# Patient Record
Sex: Female | Born: 1950 | Race: White | Hispanic: No | Marital: Single | Smoking: Never smoker
Health system: Southern US, Community
[De-identification: ages and names within clinical notes are randomized; demographics above are authoritative.]

## PROBLEM LIST (undated history)

## (undated) DIAGNOSIS — F0390 Unspecified dementia without behavioral disturbance: Secondary | ICD-10-CM

## (undated) DIAGNOSIS — I10 Essential (primary) hypertension: Secondary | ICD-10-CM

## (undated) DIAGNOSIS — F329 Major depressive disorder, single episode, unspecified: Secondary | ICD-10-CM

## (undated) DIAGNOSIS — M359 Systemic involvement of connective tissue, unspecified: Secondary | ICD-10-CM

## (undated) DIAGNOSIS — C439 Malignant melanoma of skin, unspecified: Secondary | ICD-10-CM

## (undated) DIAGNOSIS — F32A Depression, unspecified: Secondary | ICD-10-CM

## (undated) DIAGNOSIS — E119 Type 2 diabetes mellitus without complications: Secondary | ICD-10-CM

## (undated) DIAGNOSIS — D649 Anemia, unspecified: Secondary | ICD-10-CM

## (undated) DIAGNOSIS — I639 Cerebral infarction, unspecified: Secondary | ICD-10-CM

## (undated) DIAGNOSIS — M1990 Unspecified osteoarthritis, unspecified site: Secondary | ICD-10-CM

## (undated) HISTORY — PX: BREAST LUMPECTOMY: SHX2

## (undated) HISTORY — PX: ABDOMINAL HYSTERECTOMY: SHX81

## (undated) HISTORY — PX: CARPAL TUNNEL RELEASE: SHX101

## (undated) HISTORY — PX: EXCISION MELANOMA WITH SENTINEL LYMPH NODE BIOPSY: SHX5628

---

## 2005-04-22 ENCOUNTER — Ambulatory Visit: Payer: Self-pay | Admitting: Unknown Physician Specialty

## 2006-07-21 ENCOUNTER — Ambulatory Visit: Payer: Self-pay | Admitting: Unknown Physician Specialty

## 2007-11-17 ENCOUNTER — Ambulatory Visit: Payer: Self-pay | Admitting: Unknown Physician Specialty

## 2008-08-11 ENCOUNTER — Ambulatory Visit: Payer: Self-pay | Admitting: Unknown Physician Specialty

## 2009-01-09 ENCOUNTER — Ambulatory Visit: Payer: Self-pay | Admitting: Unknown Physician Specialty

## 2009-08-03 ENCOUNTER — Ambulatory Visit: Payer: Self-pay | Admitting: Gastroenterology

## 2010-04-17 ENCOUNTER — Ambulatory Visit: Payer: Self-pay | Admitting: Unknown Physician Specialty

## 2011-01-07 ENCOUNTER — Ambulatory Visit: Payer: Self-pay | Admitting: Unknown Physician Specialty

## 2011-06-25 ENCOUNTER — Ambulatory Visit: Payer: Self-pay | Admitting: Unknown Physician Specialty

## 2012-07-16 ENCOUNTER — Ambulatory Visit: Payer: Self-pay | Admitting: Physician Assistant

## 2012-09-03 ENCOUNTER — Ambulatory Visit: Payer: Self-pay | Admitting: Physician Assistant

## 2012-09-29 DIAGNOSIS — C439 Malignant melanoma of skin, unspecified: Secondary | ICD-10-CM

## 2012-09-29 HISTORY — DX: Malignant melanoma of skin, unspecified: C43.9

## 2013-03-15 ENCOUNTER — Ambulatory Visit: Payer: Self-pay | Admitting: Physician Assistant

## 2014-10-11 ENCOUNTER — Ambulatory Visit: Payer: Self-pay | Admitting: Physician Assistant

## 2015-11-23 ENCOUNTER — Encounter: Payer: Self-pay | Admitting: *Deleted

## 2015-11-26 ENCOUNTER — Encounter
Admission: RE | Disposition: A | Discharge status: Home / Self Care | Payer: Self-pay | Source: Ambulatory Visit | Attending: Gastroenterology

## 2015-11-26 ENCOUNTER — Encounter: Payer: Self-pay | Admitting: *Deleted

## 2015-11-26 ENCOUNTER — Ambulatory Visit: Payer: Federal, State, Local not specified - PPO | Admitting: Certified Registered Nurse Anesthetist

## 2015-11-26 ENCOUNTER — Ambulatory Visit
Admission: RE | Admit: 2015-11-26 | Discharge: 2015-11-26 | Disposition: A | Discharge status: Home / Self Care | Payer: Federal, State, Local not specified - PPO | Source: Ambulatory Visit | Attending: Gastroenterology | Admitting: Gastroenterology

## 2015-11-26 ENCOUNTER — Other Ambulatory Visit: Payer: Self-pay | Admitting: Gastroenterology

## 2015-11-26 DIAGNOSIS — Z87891 Personal history of nicotine dependence: Secondary | ICD-10-CM | POA: Diagnosis not present

## 2015-11-26 DIAGNOSIS — Z85820 Personal history of malignant melanoma of skin: Secondary | ICD-10-CM | POA: Insufficient documentation

## 2015-11-26 DIAGNOSIS — E119 Type 2 diabetes mellitus without complications: Secondary | ICD-10-CM | POA: Diagnosis not present

## 2015-11-26 DIAGNOSIS — F329 Major depressive disorder, single episode, unspecified: Secondary | ICD-10-CM | POA: Diagnosis not present

## 2015-11-26 DIAGNOSIS — Z7984 Long term (current) use of oral hypoglycemic drugs: Secondary | ICD-10-CM | POA: Insufficient documentation

## 2015-11-26 DIAGNOSIS — K2950 Unspecified chronic gastritis without bleeding: Secondary | ICD-10-CM | POA: Insufficient documentation

## 2015-11-26 DIAGNOSIS — K317 Polyp of stomach and duodenum: Secondary | ICD-10-CM | POA: Diagnosis not present

## 2015-11-26 DIAGNOSIS — M1990 Unspecified osteoarthritis, unspecified site: Secondary | ICD-10-CM | POA: Insufficient documentation

## 2015-11-26 DIAGNOSIS — K219 Gastro-esophageal reflux disease without esophagitis: Secondary | ICD-10-CM | POA: Diagnosis not present

## 2015-11-26 DIAGNOSIS — Z885 Allergy status to narcotic agent status: Secondary | ICD-10-CM | POA: Diagnosis not present

## 2015-11-26 DIAGNOSIS — Z79899 Other long term (current) drug therapy: Secondary | ICD-10-CM | POA: Diagnosis not present

## 2015-11-26 DIAGNOSIS — R1310 Dysphagia, unspecified: Secondary | ICD-10-CM | POA: Diagnosis present

## 2015-11-26 DIAGNOSIS — I10 Essential (primary) hypertension: Secondary | ICD-10-CM | POA: Diagnosis not present

## 2015-11-26 DIAGNOSIS — K2960 Other gastritis without bleeding: Secondary | ICD-10-CM | POA: Diagnosis not present

## 2015-11-26 DIAGNOSIS — K222 Esophageal obstruction: Secondary | ICD-10-CM | POA: Diagnosis not present

## 2015-11-26 HISTORY — PX: ESOPHAGOGASTRODUODENOSCOPY (EGD) WITH PROPOFOL: SHX5813

## 2015-11-26 HISTORY — DX: Depression, unspecified: F32.A

## 2015-11-26 HISTORY — DX: Type 2 diabetes mellitus without complications: E11.9

## 2015-11-26 HISTORY — DX: Malignant melanoma of skin, unspecified: C43.9

## 2015-11-26 HISTORY — DX: Anemia, unspecified: D64.9

## 2015-11-26 HISTORY — DX: Major depressive disorder, single episode, unspecified: F32.9

## 2015-11-26 HISTORY — DX: Unspecified osteoarthritis, unspecified site: M19.90

## 2015-11-26 HISTORY — DX: Essential (primary) hypertension: I10

## 2015-11-26 LAB — GLUCOSE, CAPILLARY: Glucose-Capillary: 169 mg/dL — ABNORMAL HIGH (ref 65–99)

## 2015-11-26 SURGERY — ESOPHAGOGASTRODUODENOSCOPY (EGD) WITH PROPOFOL
Anesthesia: General

## 2015-11-26 MED ORDER — SODIUM CHLORIDE 0.9 % IV SOLN
INTRAVENOUS | Status: DC
  Administered 2015-11-26 (×2): via INTRAVENOUS

## 2015-11-26 MED ORDER — SODIUM CHLORIDE 0.9 % IV SOLN: INTRAVENOUS | Status: DC

## 2015-11-26 MED ORDER — MIDAZOLAM HCL 2 MG/2ML IJ SOLN
INTRAMUSCULAR | Status: DC | PRN
  Administered 2015-11-26: 1 mg via INTRAVENOUS

## 2015-11-26 MED ORDER — PHENYLEPHRINE HCL 10 MG/ML IJ SOLN
INTRAMUSCULAR | Status: DC | PRN
  Administered 2015-11-26 (×2): 200 ug via INTRAVENOUS

## 2015-11-26 MED ORDER — PROPOFOL 500 MG/50ML IV EMUL
INTRAVENOUS | Status: DC | PRN
  Administered 2015-11-26: 150 ug/kg/min via INTRAVENOUS

## 2015-11-26 MED ORDER — FENTANYL CITRATE (PF) 100 MCG/2ML IJ SOLN
INTRAMUSCULAR | Status: DC | PRN
  Administered 2015-11-26: 50 ug via INTRAVENOUS

## 2015-11-26 MED ORDER — GLYCOPYRROLATE 0.2 MG/ML IJ SOLN
INTRAMUSCULAR | Status: DC | PRN
  Administered 2015-11-26: 0.2 mg via INTRAVENOUS

## 2015-11-26 MED ORDER — LIDOCAINE HCL (CARDIAC) 20 MG/ML IV SOLN
INTRAVENOUS | Status: DC | PRN
  Administered 2015-11-26: 60 mg via INTRAVENOUS

## 2015-11-26 MED ORDER — ONDANSETRON HCL 4 MG/2ML IJ SOLN
INTRAMUSCULAR | Status: DC | PRN
Start: 2015-11-26 — End: 2015-11-26
  Administered 2015-11-26: 4 mg via INTRAVENOUS

## 2015-11-26 MED ORDER — PROPOFOL 10 MG/ML IV BOLUS
INTRAVENOUS | Status: DC | PRN
  Administered 2015-11-26: 50 mg via INTRAVENOUS
  Administered 2015-11-26: 20 mg via INTRAVENOUS
  Administered 2015-11-26: 30 mg via INTRAVENOUS
  Administered 2015-11-26 (×2): 20 mg via INTRAVENOUS
  Administered 2015-11-26: 10 mg via INTRAVENOUS
  Administered 2015-11-26: 20 mg via INTRAVENOUS

## 2015-11-26 NOTE — Op Note (Signed)
[facility] Patient Name: Emily Lozano Procedure Date: 11/26/2015 8:25 AM MRN: [id number] Account #: [id number] Date of Birth: 07-17-1951 Admit Type: Outpatient Age: 65 Room: [facility] ENDO ROOM 2 Gender: Female Note Status: Finalized Procedure:            Upper GI endoscopy Indications:          Dysphagia, Esophageal reflux Providers:            Lollie Sails, MD Referring MD:         No Local Md, MD (Referring MD) Medicines:            Monitored Anesthesia Care Complications:        No immediate complications. Procedure:            Pre-Anesthesia Assessment:                       - ASA Grade Assessment: III - A patient with severe                        systemic disease.                       After obtaining informed consent, the endoscope was                        passed under direct vision. Throughout the procedure,                        the patient's blood pressure, pulse, and oxygen                        saturations were monitored continuously. The Olympus                        GIF-160 endoscope (S#. [id number]) was introduced through                        the mouth, and advanced to the third part of duodenum.                        The upper GI endoscopy was accomplished without                        difficulty. The patient tolerated the procedure well. Findings:      A low-grade of narrowing Schatzki ring (acquired) was found at the       gastroesophageal junction. A TTS dilator was passed through the scope.       Dilation with a 12-13.5-15 mm balloon dilator was performed to 15 mm.      The Z-line was variable. Biopsies were taken with a cold forceps for       histology.      Localized mild inflammation characterized by congestion (edema),       erythema and linear erosions was found in the gastric antrum. Biopsies       were taken with a cold forceps for histology.      Multiple 1 to 3 mm sessile polyps with no bleeding and no  stigmata of       recent bleeding were found in the cardia and in the gastric body. These       polyps  were removed with a cold biopsy forceps. Resection and retrieval       were complete.      The cardia and gastric fundus were normal on retroflexion.      The examined duodenum was normal.      Abnormal motility was noted in the upper third of the esophagus, in the       middle third of the esophagus and in the lower third of the esophagus.       The cricopharyngeus was abnormal. Partial ring noted.. Impression:           - Low-grade of narrowing Schatzki ring. Dilated.                       - Z-line variable. Biopsied.                       - Erosive gastritis. Biopsied.                       - Multiple gastric polyps. Resected and retrieved.                       - Normal examined duodenum. Recommendation:       - Discharge patient to home.                       - Perform routine esophageal manometry at appointment                        to be scheduled.                       - Barium swallow. Procedure Code(s):    --- Professional ---                       [phone], Esophagogastroduodenoscopy, flexible, transoral;                        with transendoscopic balloon dilation of esophagus                        (less than 30 mm diameter)                       43239, Esophagogastroduodenoscopy, flexible, transoral;                        with biopsy, single or multiple Diagnosis Code(s):    --- Professional ---                       K22.2, Esophageal obstruction                       K22.8, Other specified diseases of esophagus                       K29.60, Other gastritis without bleeding                       K31.7, Polyp of stomach and duodenum                       R13.10, Dysphagia, unspecified  K21.9, Gastro-esophageal reflux disease without                        esophagitis CPT copyright 2016 American Medical Association. All rights reserved. The codes  documented in this report are preliminary and upon coder review may  be revised to meet current compliance requirements. Lollie Sails, MD 11/26/2015 9:22:44 AM This report has been signed electronically. Number of Addenda: 0 Note Initiated On: 11/26/2015 8:25 AM      [facility]

## 2015-11-26 NOTE — Anesthesia Preprocedure Evaluation (Signed)
Anesthesia Evaluation  Patient identified by MRN, date of birth, ID band Patient awake    Reviewed: Allergy & Precautions, H&P , NPO status , Patient's Chart, lab work & pertinent test results, reviewed documented beta blocker date and time   Airway Mallampati: III   Neck ROM: full    Dental  (+) Poor Dentition   Pulmonary neg pulmonary ROS, former smoker,    Pulmonary exam normal        Cardiovascular hypertension, negative cardio ROS Normal cardiovascular exam     Neuro/Psych PSYCHIATRIC DISORDERS negative neurological ROS  negative psych ROS   GI/Hepatic negative GI ROS, Neg liver ROS,   Endo/Other  negative endocrine ROSdiabetes  Renal/GU negative Renal ROS  negative genitourinary   Musculoskeletal   Abdominal   Peds  Hematology negative hematology ROS (+) anemia ,   Anesthesia Other Findings Past Medical History:   Anemia                                                       Arthritis                                                    Cancer ([place])                                                 Depression                                                   Diabetes mellitus without complication ([place])                 Hypertension                                                 Melanoma ([place])                                             Past Surgical History:   BREAST LUMPECTOMY                                             CARPAL TUNNEL RELEASE                                         EXCISION MELANOMA WITH SENTINEL LYMPH NODE BIO*               ABDOMINAL HYSTERECTOMY  BMI    Body Mass Index   31.99 kg/m 2     Reproductive/Obstetrics                             Anesthesia Physical Anesthesia Plan  ASA: III  Anesthesia Plan: General   Post-op Pain Management:    Induction:   Airway Management Planned:   Additional Equipment:   Intra-op  Plan:   Post-operative Plan:   Informed Consent: I have reviewed the patients History and Physical, chart, labs and discussed the procedure including the risks, benefits and alternatives for the proposed anesthesia with the patient or authorized representative who has indicated his/her understanding and acceptance.   Dental Advisory Given  Plan Discussed with: CRNA  Anesthesia Plan Comments:         Anesthesia Quick Evaluation

## 2015-11-26 NOTE — Anesthesia Postprocedure Evaluation (Signed)
Anesthesia Post Note  Patient: Emily Lozano  Procedure(s) Performed: Procedure(s) (LRB): ESOPHAGOGASTRODUODENOSCOPY (EGD) WITH PROPOFOL (N/A)  Patient location during evaluation: PACU Anesthesia Type: General Level of consciousness: awake and alert Pain management: pain level controlled Vital Signs Assessment: post-procedure vital signs reviewed and stable Respiratory status: spontaneous breathing, nonlabored ventilation, respiratory function stable and patient connected to nasal cannula oxygen Cardiovascular status: blood pressure returned to baseline and stable Postop Assessment: no signs of nausea or vomiting Anesthetic complications: no    Last Vitals:  Filed Vitals:   11/26/15 0939 11/26/15 0949  BP: 117/75 128/77  Pulse: 71 68  Temp:    Resp: 16 25    Last Pain: There were no vitals filed for this visit.               Molli Barrows

## 2015-11-26 NOTE — Transfer of Care (Signed)
Immediate Anesthesia Transfer of Care Note  Patient: Emily Lozano  Procedure(s) Performed: Procedure(s): ESOPHAGOGASTRODUODENOSCOPY (EGD) WITH PROPOFOL (N/A)  Patient Location: PACU  Anesthesia Type:MAC and General  Level of Consciousness: sedated  Airway & Oxygen Therapy: Patient Spontanous Breathing  Post-op Assessment: Report given to RN  Post vital signs: stable  Last Vitals:  Filed Vitals:   11/26/15 0736  BP: 149/89  Pulse: 70  Temp: 36.8 C  Resp: 14    Complications: No apparent anesthesia complications

## 2015-11-26 NOTE — H&P (Signed)
Outpatient short stay form Pre-procedure 11/26/2015 8:26 AM Lollie Sails MD  Primary Physician: Jackelyn Poling NP  Reason for visit:  EGD  History of present illness:  Patient is a 65 year old female presenting today for EGD. She has a history of difficult to treat GERD. She is currently taking DEXA labs daily and has been for about a year. She relates some pill dysphagia but she does not regurgitate foods.  She has held her 81 mg aspirin since last Wednesday. She takes no anticoagulation medications or other aspirin products.  Current facility-administered medications:  .  0.9 %  sodium chloride infusion, , Intravenous, Continuous, Lollie Sails, MD, Last Rate: 20 mL/hr at 11/26/15 0805 .  0.9 %  sodium chloride infusion, , Intravenous, Continuous, Lollie Sails, MD  Prescriptions prior to admission  Medication Sig Dispense Refill Last Dose  . buPROPion (WELLBUTRIN XL) 300 MG 24 hr tablet Take 300 mg by mouth daily.   11/25/2015 at Unknown time  . folic acid (FOLVITE) 1 MG tablet Take 1 mg by mouth daily.   Past Week at Unknown time  . hydroxychloroquine (PLAQUENIL) 200 MG tablet Take 400 mg by mouth daily.   11/25/2015 at Unknown time  . losartan-hydrochlorothiazide (HYZAAR) 100-12.5 MG tablet Take 1 tablet by mouth daily.   11/26/2015 at 0600  . metFORMIN (GLUCOPHAGE) 500 MG tablet Take 1,000 mg by mouth 2 (two) times daily with a meal.   11/25/2015 at Unknown time  . methotrexate (RHEUMATREX) 2.5 MG tablet Take 2.5 mg by mouth once a week. Caution:Chemotherapy. Protect from light.     . metoprolol succinate (TOPROL-XL) 50 MG 24 hr tablet Take 50 mg by mouth daily. Take with or immediately following a meal.   11/26/2015 at 0600  . pantoprazole (PROTONIX) 40 MG tablet Take 40 mg by mouth 2 (two) times daily.   11/25/2015 at Unknown time  . rosuvastatin (CRESTOR) 10 MG tablet Take 10 mg by mouth daily.   11/25/2015 at Unknown time  . sucralfate (CARAFATE) 1 g tablet Take 1 g by  mouth 4 (four) times daily as needed.        Allergies  Allergen Reactions  . Morphine And Related Nausea And Vomiting     Past Medical History  Diagnosis Date  . Anemia   . Arthritis   . Cancer ([place])   . Depression   . Diabetes mellitus without complication ([place])   . Hypertension   . Melanoma ([place])     Review of systems:      Physical Exam    Heart and lungs: Regular rate and rhythm without rub or gallop, lungs are bilaterally clear.    HEENT: Normocephalic atraumatic eyes are anicteric    Other:     Pertinant exam for procedure: Soft nontender nondistended bowel sounds positive normoactive    Planned proceedures: EGD with possible dilatation. Indicated procedures. I have discussed the risks benefits and complications of procedures to include not limited to bleeding, infection, perforation and the risk of sedation and the patient wishes to proceed.    Lollie Sails, MD Gastroenterology 11/26/2015  8:26 AM

## 2015-11-28 ENCOUNTER — Encounter: Payer: Self-pay | Admitting: Gastroenterology

## 2015-11-28 LAB — SURGICAL PATHOLOGY

## 2015-12-05 ENCOUNTER — Encounter: Payer: Self-pay | Admitting: Gastroenterology

## 2015-12-05 ENCOUNTER — Ambulatory Visit
Admission: RE | Admit: 2015-12-05 | Discharge: 2015-12-05 | Disposition: A | Discharge status: Home / Self Care | Payer: Federal, State, Local not specified - PPO | Source: Ambulatory Visit | Attending: Gastroenterology | Admitting: Gastroenterology

## 2015-12-05 ENCOUNTER — Ambulatory Visit: Payer: Federal, State, Local not specified - PPO

## 2015-12-05 ENCOUNTER — Encounter
Admission: RE | Disposition: A | Discharge status: Home / Self Care | Payer: Self-pay | Source: Ambulatory Visit | Attending: Gastroenterology

## 2015-12-05 DIAGNOSIS — K219 Gastro-esophageal reflux disease without esophagitis: Secondary | ICD-10-CM | POA: Diagnosis not present

## 2015-12-05 DIAGNOSIS — R1310 Dysphagia, unspecified: Secondary | ICD-10-CM | POA: Insufficient documentation

## 2015-12-05 DIAGNOSIS — Z79899 Other long term (current) drug therapy: Secondary | ICD-10-CM | POA: Insufficient documentation

## 2015-12-05 HISTORY — PX: ESOPHAGEAL MANOMETRY: SHX5429

## 2015-12-05 SURGERY — MANOMETRY, ESOPHAGUS

## 2015-12-05 MED ORDER — BUTAMBEN-TETRACAINE-BENZOCAINE 2-2-14 % EX AERO
1.0000 | INHALATION_SPRAY | Freq: Once | CUTANEOUS | Status: AC
  Administered 2015-12-05: 1 via TOPICAL
  Filled 2015-12-05: qty 20

## 2015-12-05 MED ORDER — LIDOCAINE HCL 2 % EX GEL
1.0000 "application " | Freq: Once | CUTANEOUS | Status: AC
  Administered 2015-12-05: 3
  Filled 2015-12-05: qty 5

## 2015-12-05 SURGICAL SUPPLY — 2 items
FACESHIELD LNG OPTICON STERILE (SAFETY) IMPLANT
GLOVE BIO SURGEON STRL SZ8 (GLOVE) ×6 IMPLANT

## 2015-12-07 ENCOUNTER — Ambulatory Visit
Admission: RE | Admit: 2015-12-07 | Discharge: 2015-12-07 | Disposition: A | Discharge status: Home / Self Care | Payer: Federal, State, Local not specified - PPO | Source: Ambulatory Visit | Attending: Gastroenterology | Admitting: Gastroenterology

## 2015-12-07 DIAGNOSIS — R1310 Dysphagia, unspecified: Secondary | ICD-10-CM

## 2016-04-16 ENCOUNTER — Other Ambulatory Visit: Payer: Self-pay | Admitting: Physician Assistant

## 2016-04-16 DIAGNOSIS — Z1231 Encounter for screening mammogram for malignant neoplasm of breast: Secondary | ICD-10-CM

## 2016-05-05 ENCOUNTER — Other Ambulatory Visit: Payer: Self-pay | Admitting: Physician Assistant

## 2016-05-05 ENCOUNTER — Ambulatory Visit
Admission: RE | Admit: 2016-05-05 | Discharge: 2016-05-05 | Disposition: A | Discharge status: Home / Self Care | Payer: Medicare Other | Source: Ambulatory Visit | Attending: Physician Assistant | Admitting: Physician Assistant

## 2016-05-05 DIAGNOSIS — Z1231 Encounter for screening mammogram for malignant neoplasm of breast: Secondary | ICD-10-CM

## 2017-04-30 ENCOUNTER — Ambulatory Visit
Admission: RE | Admit: 2017-04-30 | Discharge: 2017-04-30 | Disposition: A | Discharge status: Home / Self Care | Payer: Medicare Other | Source: Ambulatory Visit | Attending: Gastroenterology | Admitting: Gastroenterology

## 2017-04-30 ENCOUNTER — Ambulatory Visit: Payer: Medicare Other | Admitting: Certified Registered Nurse Anesthetist

## 2017-04-30 ENCOUNTER — Other Ambulatory Visit: Payer: Self-pay | Admitting: Gastroenterology

## 2017-04-30 ENCOUNTER — Encounter
Admission: RE | Disposition: A | Discharge status: Home / Self Care | Payer: Self-pay | Source: Ambulatory Visit | Attending: Gastroenterology

## 2017-04-30 DIAGNOSIS — Z85820 Personal history of malignant melanoma of skin: Secondary | ICD-10-CM | POA: Diagnosis not present

## 2017-04-30 DIAGNOSIS — I10 Essential (primary) hypertension: Secondary | ICD-10-CM | POA: Insufficient documentation

## 2017-04-30 DIAGNOSIS — K317 Polyp of stomach and duodenum: Secondary | ICD-10-CM | POA: Diagnosis not present

## 2017-04-30 DIAGNOSIS — K210 Gastro-esophageal reflux disease with esophagitis: Secondary | ICD-10-CM | POA: Insufficient documentation

## 2017-04-30 DIAGNOSIS — K2950 Unspecified chronic gastritis without bleeding: Secondary | ICD-10-CM | POA: Insufficient documentation

## 2017-04-30 DIAGNOSIS — K2270 Barrett's esophagus without dysplasia: Secondary | ICD-10-CM | POA: Insufficient documentation

## 2017-04-30 DIAGNOSIS — Z7984 Long term (current) use of oral hypoglycemic drugs: Secondary | ICD-10-CM | POA: Insufficient documentation

## 2017-04-30 DIAGNOSIS — Z79899 Other long term (current) drug therapy: Secondary | ICD-10-CM | POA: Diagnosis not present

## 2017-04-30 DIAGNOSIS — E119 Type 2 diabetes mellitus without complications: Secondary | ICD-10-CM | POA: Insufficient documentation

## 2017-04-30 DIAGNOSIS — F329 Major depressive disorder, single episode, unspecified: Secondary | ICD-10-CM | POA: Insufficient documentation

## 2017-04-30 DIAGNOSIS — Z87891 Personal history of nicotine dependence: Secondary | ICD-10-CM | POA: Diagnosis not present

## 2017-04-30 DIAGNOSIS — R1310 Dysphagia, unspecified: Secondary | ICD-10-CM

## 2017-04-30 HISTORY — PX: ESOPHAGOGASTRODUODENOSCOPY (EGD) WITH PROPOFOL: SHX5813

## 2017-04-30 LAB — GLUCOSE, CAPILLARY: GLUCOSE-CAPILLARY: 148 mg/dL — AB (ref 65–99)

## 2017-04-30 SURGERY — ESOPHAGOGASTRODUODENOSCOPY (EGD) WITH PROPOFOL
Anesthesia: General

## 2017-04-30 MED ORDER — PROPOFOL 10 MG/ML IV BOLUS
INTRAVENOUS | Status: DC | PRN
  Administered 2017-04-30 (×2): 30 mg via INTRAVENOUS
  Administered 2017-04-30: 50 mg via INTRAVENOUS
  Administered 2017-04-30: 30 mg via INTRAVENOUS

## 2017-04-30 MED ORDER — PROPOFOL 500 MG/50ML IV EMUL
INTRAVENOUS | Status: DC | PRN
  Administered 2017-04-30: 150 ug/kg/min via INTRAVENOUS

## 2017-04-30 MED ORDER — EPHEDRINE SULFATE 50 MG/ML IJ SOLN
INTRAMUSCULAR | Status: AC
  Filled 2017-04-30: qty 1

## 2017-04-30 MED ORDER — SODIUM CHLORIDE 0.9 % IV SOLN
INTRAVENOUS | Status: DC
  Administered 2017-04-30: 1000 mL via INTRAVENOUS

## 2017-04-30 MED ORDER — PROPOFOL 500 MG/50ML IV EMUL
INTRAVENOUS | Status: AC
  Filled 2017-04-30: qty 50

## 2017-04-30 MED ORDER — SODIUM CHLORIDE 0.9 % IV SOLN: INTRAVENOUS | Status: DC

## 2017-04-30 MED ORDER — LIDOCAINE HCL (PF) 2 % IJ SOLN
INTRAMUSCULAR | Status: AC
  Filled 2017-04-30: qty 2

## 2017-04-30 MED ORDER — PROPOFOL 10 MG/ML IV BOLUS
INTRAVENOUS | Status: AC
  Filled 2017-04-30: qty 20

## 2017-04-30 MED ORDER — SUCCINYLCHOLINE CHLORIDE 20 MG/ML IJ SOLN
INTRAMUSCULAR | Status: AC
  Filled 2017-04-30: qty 1

## 2017-04-30 MED ORDER — GLYCOPYRROLATE 0.2 MG/ML IJ SOLN
INTRAMUSCULAR | Status: AC
  Filled 2017-04-30: qty 2

## 2017-04-30 MED ORDER — PHENYLEPHRINE HCL 10 MG/ML IJ SOLN
INTRAMUSCULAR | Status: AC
  Filled 2017-04-30: qty 1

## 2017-04-30 MED ORDER — LIDOCAINE HCL (CARDIAC) 20 MG/ML IV SOLN
INTRAVENOUS | Status: DC | PRN
  Administered 2017-04-30 (×2): 50 mg via INTRAVENOUS

## 2017-04-30 MED ORDER — GLYCOPYRROLATE 0.2 MG/ML IJ SOLN
INTRAMUSCULAR | Status: DC | PRN
  Administered 2017-04-30: 0.2 mg via INTRAVENOUS

## 2017-04-30 MED ORDER — ONDANSETRON HCL 4 MG/2ML IJ SOLN
INTRAMUSCULAR | Status: AC
  Filled 2017-04-30: qty 2

## 2017-04-30 NOTE — H&P (Signed)
Outpatient short stay form Pre-procedure 04/30/2017 7:36 AM Emily Sails MD  Primary Physician: Jackelyn Poling NP  Reason for visit:  EGD  History of present illness:  Patient is a 66 year old female presenting today as above. She has personal history of an EGD about a year ago for reflux that showed biopsies indicating possible Barrett's esophagus. She is repeating today for further evaluation. She has been on a proton pump inhibitor in the interim, DEXAlant. She states that her reflux symptoms are much better than they were that she will occasionally have nighttime breakthrough. She takes no aspirin or blood thinning agents.    Current Facility-Administered Medications:  .  0.9 %  sodium chloride infusion, , Intravenous, Continuous, Emily Sails, MD, Last Rate: 20 mL/hr at 04/30/17 0714, 1,000 mL at 04/30/17 0714 .  0.9 %  sodium chloride infusion, , Intravenous, Continuous, Emily Sails, MD  Prescriptions Prior to Admission  Medication Sig Dispense Refill Last Dose  . buPROPion (WELLBUTRIN XL) 300 MG 24 hr tablet Take 300 mg by mouth daily.   04/29/2017 at Unknown time  . folic acid (FOLVITE) 1 MG tablet Take 1 mg by mouth daily.   04/29/2017 at Unknown time  . hydroxychloroquine (PLAQUENIL) 200 MG tablet Take 400 mg by mouth daily.   04/29/2017 at Unknown time  . losartan-hydrochlorothiazide (HYZAAR) 100-12.5 MG tablet Take 1 tablet by mouth daily.   04/30/2017 at 0600  . metFORMIN (GLUCOPHAGE) 500 MG tablet Take 1,000 mg by mouth 2 (two) times daily with a meal.   04/29/2017 at Unknown time  . methotrexate (RHEUMATREX) 2.5 MG tablet Take 2.5 mg by mouth once a week. Caution:Chemotherapy. Protect from light.   04/29/2017 at Unknown time  . metoprolol succinate (TOPROL-XL) 50 MG 24 hr tablet Take 50 mg by mouth daily. Take with or immediately following a meal.   04/30/2017 at 0600  . pantoprazole (PROTONIX) 40 MG tablet Take 40 mg by mouth 2 (two) times daily.   04/29/2017 at Unknown time   . rosuvastatin (CRESTOR) 10 MG tablet Take 10 mg by mouth daily.   04/29/2017 at Unknown time  . sucralfate (CARAFATE) 1 g tablet Take 1 g by mouth 4 (four) times daily as needed.   04/29/2017 at Unknown time     Allergies  Allergen Reactions  . Morphine And Related Nausea And Vomiting     Past Medical History:  Diagnosis Date  . Anemia   . Arthritis   . Cancer ([place])   . Depression   . Diabetes mellitus without complication ([place])   . Hypertension   . Melanoma ([place])     Review of systems:      Physical Exam    Heart and lungs: Regular rate and rhythm without rub or gallop, lungs are bilaterally clear.    HEENT: Normocephalic atraumatic eyes are anicteric    Other:     Pertinant exam for procedure: Soft nontender nondistended bowel sounds positive normoactive.    Planned proceedures: EGD and indicated procedures. I have discussed the risks benefits and complications of procedures to include not limited to bleeding, infection, perforation and the risk of sedation and the patient wishes to proceed.    Emily Sails, MD Gastroenterology 04/30/2017  7:36 AM

## 2017-04-30 NOTE — Anesthesia Post-op Follow-up Note (Cosign Needed)
Anesthesia QCDR form completed.        

## 2017-04-30 NOTE — Transfer of Care (Signed)
Immediate Anesthesia Transfer of Care Note  Patient: Emily Lozano  Procedure(s) Performed: Procedure(s): ESOPHAGOGASTRODUODENOSCOPY (EGD) WITH PROPOFOL (N/A)  Patient Location: PACU  Anesthesia Type:General  Level of Consciousness: drowsy and patient cooperative  Airway & Oxygen Therapy: Patient Spontanous Breathing and Patient connected to nasal cannula oxygen  Post-op Assessment: Report given to RN and Post -op Vital signs reviewed and stable  Post vital signs: Reviewed and stable  Last Vitals:  Vitals:   04/30/17 0700 04/30/17 0812  BP: (!) 152/70 (!) 109/56  Pulse: 72 81  Resp: 16 (!) 21  Temp: (!) 36.1 C (!) 36 C    Last Pain:  Vitals:   04/30/17 0812  TempSrc: Tympanic         Complications: No apparent anesthesia complications

## 2017-04-30 NOTE — Anesthesia Postprocedure Evaluation (Signed)
Anesthesia Post Note  Patient: Emily Lozano  Procedure(s) Performed: Procedure(s) (LRB): ESOPHAGOGASTRODUODENOSCOPY (EGD) WITH PROPOFOL (N/A)  Patient location during evaluation: PACU Anesthesia Type: General Level of consciousness: awake Pain management: pain level controlled Respiratory status: spontaneous breathing Cardiovascular status: stable Anesthetic complications: no     Last Vitals:  Vitals:   04/30/17 0832 04/30/17 0842  BP: 126/63 (!) 124/57  Pulse: 75 73  Resp: 17 16  Temp:      Last Pain:  Vitals:   04/30/17 0812  TempSrc: Tympanic                 VAN STAVEREN,Baili Stang

## 2017-04-30 NOTE — Anesthesia Procedure Notes (Signed)
Date/Time: 04/30/2017 7:36 AM Performed by: Darlyne Russian Pre-anesthesia Checklist: Patient identified, Emergency Drugs available, Suction available, Patient being monitored and Timeout performed Patient Re-evaluated:Patient Re-evaluated prior to induction Oxygen Delivery Method: Nasal cannula Placement Confirmation: positive ETCO2

## 2017-04-30 NOTE — Op Note (Signed)
[facility] Patient Name: Emily Lozano Procedure Date: 04/30/2017 7:21 AM MRN: [id number] Account #: [id number] Date of Birth: 04/10/1951 Admit Type: Outpatient Age: 66 Room: [facility] ENDO ROOM 1 Gender: Female Note Status: Finalized Procedure:            Upper GI endoscopy Indications:          Follow-up of Barrett's esophagus Providers:            Lollie Sails, MD Referring MD:         Precious Bard, MD (Referring MD) Medicines:            Monitored Anesthesia Care Complications:        No immediate complications. Procedure:            Pre-Anesthesia Assessment:                       - ASA Grade Assessment: II - A patient with mild                        systemic disease.                       After obtaining informed consent, the endoscope was                        passed under direct vision. Throughout the procedure,                        the patient's blood pressure, pulse, and oxygen                        saturations were monitored continuously. The Endoscope                        was introduced through the mouth, and advanced to the                        third part of duodenum. The upper GI endoscopy was                        accomplished without difficulty. The patient tolerated                        the procedure well. Findings:      The Z-line was variable. Mucosa was biopsied with a cold forceps for       histology in 4 quadrants at the gastroesophageal junction. One specimen       bottle was sent to pathology.      On introduction of the scope there was the appearance of a moderate       sized nodule in the distal esophagus at about 38-39 cm from the       incisors. There was no overlying defect. However on removal of the scope       this nodule appearance was not present. Possible transient extrinsic       compression?      The exam of the esophagus was otherwise normal.      Localized mild inflammation  characterized by congestion (edema) and       erythema was found in the cardia and in the gastric antrum, near  polypoid in appearance. Biopsies were taken from both sites. The gastric       vault otherwise was normal in appearance. Biopsies were taken with a       cold forceps for histology.      The examined duodenum was normal. Impression:           - Z-line variable. Biopsied.                       - Gastritis. Biopsied.                       - Normal examined duodenum. Recommendation:       - Continue present medications.                       - Return to GI clinic in 3 weeks.                       - Await pathology results. Procedure Code(s):    --- Professional ---                       [phone], Esophagogastroduodenoscopy, flexible, transoral;                        with biopsy, single or multiple Diagnosis Code(s):    --- Professional ---                       K22.8, Other specified diseases of esophagus                       K29.70, Gastritis, unspecified, without bleeding                       K22.70, Barrett's esophagus without dysplasia CPT copyright 2016 American Medical Association. All rights reserved. The codes documented in this report are preliminary and upon coder review may  be revised to meet current compliance requirements. Lollie Sails, MD 04/30/2017 8:17:22 AM This report has been signed electronically. Number of Addenda: 0 Note Initiated On: 04/30/2017 7:21 AM      [facility]

## 2017-04-30 NOTE — Anesthesia Preprocedure Evaluation (Signed)
Anesthesia Evaluation  Patient identified by MRN, date of birth, ID band Patient awake    Reviewed: Allergy & Precautions, NPO status , reviewed documented beta blocker date and time   Airway Mallampati: III       Dental  (+) Teeth Intact   Pulmonary former smoker,    breath sounds clear to auscultation       Cardiovascular Exercise Tolerance: Good hypertension, Pt. on medications and Pt. on home beta blockers  Rhythm:Regular     Neuro/Psych Depression    GI/Hepatic negative GI ROS, Neg liver ROS,   Endo/Other  diabetes, Type 2, Oral Hypoglycemic Agents  Renal/GU negative Renal ROS     Musculoskeletal   Abdominal Normal abdominal exam  (+)   Peds negative pediatric ROS (+)  Hematology  (+) anemia ,   Anesthesia Other Findings   Reproductive/Obstetrics                             Anesthesia Physical Anesthesia Plan  ASA: II  Anesthesia Plan: General   Post-op Pain Management:    Induction: Intravenous  PONV Risk Score and Plan: 0  Airway Management Planned: Natural Airway and Nasal Cannula  Additional Equipment:   Intra-op Plan:   Post-operative Plan:   Informed Consent: I have reviewed the patients History and Physical, chart, labs and discussed the procedure including the risks, benefits and alternatives for the proposed anesthesia with the patient or authorized representative who has indicated his/her understanding and acceptance.     Plan Discussed with: CRNA  Anesthesia Plan Comments:         Anesthesia Quick Evaluation

## 2017-05-01 ENCOUNTER — Encounter: Payer: Self-pay | Admitting: Gastroenterology

## 2017-05-01 LAB — SURGICAL PATHOLOGY

## 2017-05-07 ENCOUNTER — Ambulatory Visit
Admission: RE | Admit: 2017-05-07 | Discharge: 2017-05-07 | Disposition: A | Discharge status: Home / Self Care | Payer: Medicare Other | Source: Ambulatory Visit | Attending: Gastroenterology | Admitting: Gastroenterology

## 2017-05-07 DIAGNOSIS — R1310 Dysphagia, unspecified: Secondary | ICD-10-CM | POA: Insufficient documentation

## 2017-05-07 DIAGNOSIS — R590 Localized enlarged lymph nodes: Secondary | ICD-10-CM | POA: Insufficient documentation

## 2017-05-07 DIAGNOSIS — I2510 Atherosclerotic heart disease of native coronary artery without angina pectoris: Secondary | ICD-10-CM | POA: Insufficient documentation

## 2017-05-07 DIAGNOSIS — R911 Solitary pulmonary nodule: Secondary | ICD-10-CM | POA: Insufficient documentation

## 2017-05-07 DIAGNOSIS — K760 Fatty (change of) liver, not elsewhere classified: Secondary | ICD-10-CM | POA: Diagnosis not present

## 2017-05-07 DIAGNOSIS — K2270 Barrett's esophagus without dysplasia: Secondary | ICD-10-CM | POA: Diagnosis not present

## 2017-05-07 DIAGNOSIS — I700 Atherosclerosis of aorta: Secondary | ICD-10-CM | POA: Insufficient documentation

## 2017-05-07 HISTORY — DX: Systemic involvement of connective tissue, unspecified: M35.9

## 2017-05-07 MED ORDER — IOPAMIDOL (ISOVUE-300) INJECTION 61%
75.0000 mL | Freq: Once | INTRAVENOUS | Status: AC | PRN
  Administered 2017-05-07: 75 mL via INTRAVENOUS

## 2017-06-16 ENCOUNTER — Ambulatory Visit (INDEPENDENT_AMBULATORY_CARE_PROVIDER_SITE_OTHER): Payer: Medicare Other | Admitting: Internal Medicine

## 2017-06-16 ENCOUNTER — Encounter: Payer: Self-pay | Admitting: Internal Medicine

## 2017-06-16 VITALS — BP 144/84 | HR 73 | Resp 16 | Ht 61.5 in | Wt 169.0 lb

## 2017-06-16 DIAGNOSIS — R918 Other nonspecific abnormal finding of lung field: Secondary | ICD-10-CM

## 2017-06-16 NOTE — Progress Notes (Signed)
Name: Emily Lozano MRN: [id number] DOB: 05-24-1951     CONSULTATION DATE: (Not on file)  REFERRING MD : [place]:  abnormal CT scan   STUDIES:  CT chest 05/07/2017 I have Independently reviewed images of  CT chest  on 06/16/2017 Interpretation: Left upper lobe subcentimeter pulmonary nodules groundglass appearance 6.9 mm    HISTORY OF PRESENT ILLNESS:  66 year old pleasant white female seen today for abnormal CT scan findings Patient has chronic gastro-esophageal reflux disease symptoms for many years and underwent a upper endoscopy and for some reason the GI doctor ordered a CT chest which found to have a left upper lobe incidental finding of a ground glass nodule approximately 6.9 mm  Patient is a former smoker approximately one pack a week for about 10 years Patient did have left arm surgery for history of melanoma back in October 2014 Patient does have a diagnosis of shock of sleep apnea however has been noncompliant over the last 6 months  Patient has no shortness of breath noticed an exertion [place] patient denies any wheezing or cough No fevers or chills No signs and symptoms of infection at this time No signs of acute heart failure at this time  I have reviewed the results with the patient and discussed the CT scan findings with the patient and she understands   PAST MEDICAL HISTORY :   has a past medical history of Anemia; Arthritis; Collagen vascular disease ([place]); Depression; Diabetes mellitus without complication ([place]); Hypertension; and Melanoma ([place]) (2014).  has a past surgical history that includes Breast lumpectomy; Carpal tunnel release; Excision melanoma with sentinel lymph node biopsy; Abdominal hysterectomy; Esophagogastroduodenoscopy (egd) with propofol (N/A, 11/26/2015); Esophageal manometry (N/A, 12/05/2015); and Esophagogastroduodenoscopy (egd) with propofol (N/A, 04/30/2017). Prior to Admission medications   Medication Sig Start Date  End Date Taking? Authorizing Provider  buPROPion (WELLBUTRIN XL) 300 MG 24 hr tablet Take 300 mg by mouth daily.   Yes [provider]  dexlansoprazole (DEXILANT) 60 MG capsule Take 60 mg by mouth daily. 03/27/17  Yes [provider]  folic acid (FOLVITE) 1 MG tablet Take 1 mg by mouth daily.   Yes [provider]  hydroxychloroquine (PLAQUENIL) 200 MG tablet Take 400 mg by mouth daily.   Yes [provider]  losartan-hydrochlorothiazide (HYZAAR) 100-12.5 MG tablet Take 1 tablet by mouth daily.   Yes [provider]  metFORMIN (GLUCOPHAGE) 500 MG tablet Take 1,000 mg by mouth 2 (two) times daily with a meal.   Yes [provider]  methotrexate (RHEUMATREX) 2.5 MG tablet Take 2.5 mg by mouth once a week. Caution:Chemotherapy. Protect from light.   Yes [provider]  metoprolol succinate (TOPROL-XL) 50 MG 24 hr tablet Take 50 mg by mouth daily. Take with or immediately following a meal.   Yes [provider]  pantoprazole (PROTONIX) 40 MG tablet Take 40 mg by mouth 2 (two) times daily.   Yes [provider]  rosuvastatin (CRESTOR) 10 MG tablet Take 10 mg by mouth daily.   Yes [provider]  sucralfate (CARAFATE) 1 g tablet Take 1 g by mouth 4 (four) times daily as needed.   Yes [provider]  aspirin EC 81 MG tablet Take 81 mg by mouth daily.    [provider]   Allergies  Allergen Reactions  . Morphine And Related Nausea And Vomiting    FAMILY HISTORY:  family history is not on file. SOCIAL HISTORY:  reports that she  has quit smoking. She has never used smokeless tobacco. She reports that she does not drink alcohol or use drugs.  REVIEW OF SYSTEMS:   Constitutional: Negative for fever, chills, weight loss, malaise/fatigue and diaphoresis.  HENT: Negative for hearing loss, ear pain, nosebleeds, congestion, sore throat, neck pain, tinnitus and ear discharge.   Eyes: Negative for  blurred vision, double vision, photophobia, pain, discharge and redness.  Respiratory: Negative for cough, hemoptysis, sputum production, shortness of breath, wheezing and stridor.   Cardiovascular: Negative for chest pain, palpitations, orthopnea, claudication, leg swelling and PND.  Gastrointestinal: Negative for heartburn, nausea, vomiting, abdominal pain, diarrhea, constipation, blood in stool and melena.  Genitourinary: Negative for dysuria, urgency, frequency, hematuria and flank pain.  Musculoskeletal: Negative for myalgias, back pain, joint pain and falls.  Skin: Negative for itching and rash.  Neurological: Negative for dizziness, tingling, tremors, sensory change, speech change, focal weakness, seizures, loss of consciousness, weakness and headaches.  Endo/Heme/Allergies: Negative for environmental allergies and polydipsia. Does not bruise/bleed easily.  ALL OTHER ROS ARE NEGATIVE   BP (!) 144/84 (BP Location: Left Arm, Cuff Size: Normal)   Pulse 73   Resp 16   Ht 5' 1.5" (1.562 m)   Wt 169 lb (76.7 kg)   SpO2 98%   BMI 31.42 kg/m    Physical Examination:   GENERAL:NAD, no fevers, chills, no weakness no fatigue HEAD: Normocephalic, atraumatic.  EYES: Pupils equal, round, reactive to light. Extraocular muscles intact. No scleral icterus.  MOUTH: Moist mucosal membrane.   EAR, NOSE, THROAT: Clear without exudates. No external lesions.  NECK: Supple. No thyromegaly. No nodules. No JVD.  PULMONARY:CTA B/L no wheezes, no crackles, no rhonchi CARDIOVASCULAR: S1 and S2. Regular rate and rhythm. No murmurs, rubs, or gallops. No edema.  GASTROINTESTINAL: Soft, nontender, nondistended. No masses. Positive bowel sounds.  MUSCULOSKELETAL: No swelling, clubbing, or edema. Range of motion full in all extremities.  NEUROLOGIC: Cranial nerves II through XII are intact. No gross focal neurological deficits.  SKIN: No ulceration, lesions, rashes, or cyanosis. Skin warm and dry. Turgor  intact.  PSYCHIATRIC: Mood, affect within normal limits. The patient is awake, alert and oriented x 3. Insight, judgment intact.     ASSESSMENT / PLAN: 66 year old pleasant white female former smoker with a history of left arm melanoma presents today for abnormal CT chest findings with a subcentimeter 6.9 mm left upper lobe groundglass nodular opacity which at this time I would recommend a follow-up CT scan in 6 months for further progression and interval changes. Patient understands the plan of action and is satisfied with the plan of care  Patient satisfied with Plan of action and management. All questions answered  Corrin Parker, M.D.  [facility]  Medical Director [place] Director [facility] Cardio-Pulmonary Department

## 2017-06-16 NOTE — Patient Instructions (Signed)
Repeat CT chest in 6 months to follow up left upper lobe nodule

## 2017-06-23 ENCOUNTER — Other Ambulatory Visit: Payer: Self-pay | Admitting: Gastroenterology

## 2017-06-23 DIAGNOSIS — K219 Gastro-esophageal reflux disease without esophagitis: Secondary | ICD-10-CM

## 2017-06-23 DIAGNOSIS — R110 Nausea: Secondary | ICD-10-CM

## 2017-06-23 DIAGNOSIS — R1013 Epigastric pain: Secondary | ICD-10-CM

## 2017-06-25 ENCOUNTER — Ambulatory Visit
Admission: RE | Admit: 2017-06-25 | Discharge: 2017-06-25 | Disposition: A | Discharge status: Home / Self Care | Payer: Medicare Other | Source: Ambulatory Visit | Attending: Gastroenterology | Admitting: Gastroenterology

## 2017-06-25 DIAGNOSIS — R1013 Epigastric pain: Secondary | ICD-10-CM | POA: Diagnosis not present

## 2017-06-25 DIAGNOSIS — R110 Nausea: Secondary | ICD-10-CM | POA: Diagnosis not present

## 2017-06-25 DIAGNOSIS — K219 Gastro-esophageal reflux disease without esophagitis: Secondary | ICD-10-CM | POA: Diagnosis not present

## 2017-12-02 ENCOUNTER — Ambulatory Visit
Admission: RE | Admit: 2017-12-02 | Discharge: 2017-12-02 | Disposition: A | Discharge status: Home / Self Care | Payer: Medicare Other | Source: Ambulatory Visit | Attending: Internal Medicine | Admitting: Internal Medicine

## 2017-12-02 DIAGNOSIS — I2510 Atherosclerotic heart disease of native coronary artery without angina pectoris: Secondary | ICD-10-CM | POA: Diagnosis not present

## 2017-12-02 DIAGNOSIS — R918 Other nonspecific abnormal finding of lung field: Secondary | ICD-10-CM | POA: Diagnosis present

## 2017-12-02 DIAGNOSIS — K760 Fatty (change of) liver, not elsewhere classified: Secondary | ICD-10-CM | POA: Insufficient documentation

## 2017-12-02 DIAGNOSIS — R590 Localized enlarged lymph nodes: Secondary | ICD-10-CM | POA: Insufficient documentation

## 2017-12-02 DIAGNOSIS — I2584 Coronary atherosclerosis due to calcified coronary lesion: Secondary | ICD-10-CM | POA: Insufficient documentation

## 2017-12-02 DIAGNOSIS — I700 Atherosclerosis of aorta: Secondary | ICD-10-CM | POA: Insufficient documentation

## 2017-12-10 ENCOUNTER — Ambulatory Visit (INDEPENDENT_AMBULATORY_CARE_PROVIDER_SITE_OTHER): Payer: Medicare Other | Admitting: Internal Medicine

## 2017-12-10 ENCOUNTER — Encounter: Payer: Self-pay | Admitting: Internal Medicine

## 2017-12-10 VITALS — BP 122/84 | HR 86 | Ht 61.5 in | Wt 169.0 lb

## 2017-12-10 DIAGNOSIS — R918 Other nonspecific abnormal finding of lung field: Secondary | ICD-10-CM

## 2017-12-10 DIAGNOSIS — G4719 Other hypersomnia: Secondary | ICD-10-CM

## 2017-12-10 NOTE — Patient Instructions (Signed)
Follow up with CT chest in 6 months Will need to obtain sleep studay

## 2017-12-10 NOTE — Progress Notes (Signed)
Name: Emily Lozano MRN: [id number] DOB: [birth year]     CONSULTATION DATE: 3.14.19 REFERRING MD : [facility] [place]  CHIEF COMPLAINT:  abnormal CT scan   STUDIES:  CT chest 3.14.19 I have Independently reviewed images of  CT chest  3.14.19 Interpretation: Left upper lobe subcentimeter pulmonary nodules groundglass appearance 6.9 mm unchanged from previous examination with CT chest in 04/2017    HISTORY OF PRESENT ILLNESS:  67 year old pleasant white female seen today for abnormal CT scan findings Patient has chronic gastro-esophageal reflux disease symptoms for many years and underwent a upper endoscopy and for some reason the GI doctor ordered a CT chest which found to have a left upper lobe incidental finding of a ground glass nodule approximately 6.9 mm LLL 4 mm nodule also present but stable since last exam  It has unchanged in size over last 6 months   Patient is a former smoker approximately one pack a week for about 10 years Patient did have left arm surgery for history of melanoma back in October 2014 Patient does have a diagnosis of shock of sleep apnea however has been noncompliant over the last 6 months  Patient has no shortness of breath noticed, patient denies any wheezing or cough No fevers or chills No signs and symptoms of infection at this time No signs of acute heart failure at this time  I have reviewed the results with the patient and discussed the CT scan findings with the patient and she understands 6 MM nodular GGO stable over last 6 months   Patient  has been having sleep problems  Patient has been having excessive daytime sleepiness Patient has been having extreme fatigue and tiredness, lack of energy +  very Loud snoring every night + struggling breathe at night and gasps for air  EPWORTH score 12  REVIEW OF SYSTEMS:   Constitutional: Negative for fever, chills, weight loss, malaise/fatigue and diaphoresis.  HENT: Negative for hearing loss, ear  pain, nosebleeds, congestion, sore throat, neck pain, tinnitus and ear discharge.   Eyes: Negative for blurred vision, double vision, photophobia, pain, discharge and redness.  Respiratory: Negative for cough, hemoptysis, sputum production, shortness of breath, wheezing and stridor.   Cardiovascular: Negative for chest pain, palpitations, orthopnea, claudication, leg swelling and PND.  Gastrointestinal: +heartburn, nausea, vomiting, abdominal pain, diarrhea, constipation, blood in stool and melena.  ALL OTHER ROS ARE NEGATIVE   BP 122/84 (BP Location: Left Arm, Cuff Size: Normal)   Pulse 86   Ht 5' 1.5" (1.562 m)   Wt 169 lb (76.7 kg)   SpO2 96%   BMI 31.42 kg/m   Physical Examination:   GENERAL:NAD, no fevers, chills, no weakness no fatigue HEAD: Normocephalic, atraumatic.  EYES: Pupils equal, round, reactive to light. Extraocular muscles intact. No scleral icterus.  MOUTH: Moist mucosal membrane.   EAR, NOSE, THROAT: Clear without exudates. No external lesions.  NECK: Supple. No thyromegaly. No nodules. No JVD.  PULMONARY:CTA B/L no wheezes, no crackles, no rhonchi CARDIOVASCULAR: S1 and S2. Regular rate and rhythm. No murmurs, rubs, or gallops. No edema.  GASTROINTESTINAL: Soft, nontender, nondistended. No masses. Positive bowel sounds.  MUSCULOSKELETAL: No swelling, clubbing, or edema. Range of motion full in all extremities.  NEUROLOGIC: Cranial nerves II through XII are intact. No gross focal neurological deficits.  SKIN: No ulceration, lesions, rashes, or cyanosis. Skin warm and dry. Turgor intact.  PSYCHIATRIC: Mood, affect within normal limits. The patient is awake, alert and oriented x 3. Insight, judgment intact.  ASSESSMENT / PLAN: 67 year old pleasant white female former smoker with a history of left arm melanoma presents today for abnormal CT chest findings with a subcentimeter 6.9 mm left upper lobe groundglass nodular opacity which at this time I would recommend a  follow-up CT scan in 6 months for further progression and interval changes. There has been no significant interval changes with current CT chest and previous CT chest howvere they need to be followed closely since she is a former smoker and has h/o melanoma  Patient has been dx with sleep apnea in the past and has signs and symptoms of excessive daytime sleepiness and fatigue Plan for sleep study assessment  Patient understands the plan of action and is satisfied with the plan of care  Patient satisfied with Plan of action and management. All questions answered  Corrin Parker, M.D.  [facility]  Medical Director [place] Director [facility] Cardio-Pulmonary Department

## 2017-12-20 ENCOUNTER — Encounter: Payer: Self-pay | Admitting: Internal Medicine

## 2017-12-20 DIAGNOSIS — G4719 Other hypersomnia: Secondary | ICD-10-CM

## 2017-12-20 DIAGNOSIS — G4733 Obstructive sleep apnea (adult) (pediatric): Secondary | ICD-10-CM | POA: Diagnosis not present

## 2018-01-08 DIAGNOSIS — G4733 Obstructive sleep apnea (adult) (pediatric): Secondary | ICD-10-CM | POA: Diagnosis not present

## 2018-01-11 ENCOUNTER — Telehealth: Payer: Self-pay | Admitting: *Deleted

## 2018-01-11 ENCOUNTER — Other Ambulatory Visit: Payer: Self-pay | Admitting: *Deleted

## 2018-01-11 DIAGNOSIS — G4733 Obstructive sleep apnea (adult) (pediatric): Secondary | ICD-10-CM

## 2018-01-11 NOTE — Telephone Encounter (Signed)
Pt aware of results of HST. Orders placed Nothing further needed. 

## 2018-05-18 ENCOUNTER — Ambulatory Visit
Admission: RE | Admit: 2018-05-18 | Discharge: 2018-05-18 | Disposition: A | Discharge status: Home / Self Care | Payer: Medicare Other | Source: Ambulatory Visit | Attending: Internal Medicine | Admitting: Internal Medicine

## 2018-05-18 DIAGNOSIS — I2510 Atherosclerotic heart disease of native coronary artery without angina pectoris: Secondary | ICD-10-CM | POA: Diagnosis not present

## 2018-05-18 DIAGNOSIS — K760 Fatty (change of) liver, not elsewhere classified: Secondary | ICD-10-CM | POA: Insufficient documentation

## 2018-05-18 DIAGNOSIS — R918 Other nonspecific abnormal finding of lung field: Secondary | ICD-10-CM | POA: Insufficient documentation

## 2018-05-18 DIAGNOSIS — I700 Atherosclerosis of aorta: Secondary | ICD-10-CM | POA: Insufficient documentation

## 2018-05-25 ENCOUNTER — Ambulatory Visit (INDEPENDENT_AMBULATORY_CARE_PROVIDER_SITE_OTHER): Payer: Medicare Other | Admitting: Internal Medicine

## 2018-05-25 ENCOUNTER — Encounter: Payer: Self-pay | Admitting: Internal Medicine

## 2018-05-25 VITALS — BP 120/78 | HR 74 | Ht 61.5 in | Wt 164.0 lb

## 2018-05-25 DIAGNOSIS — J449 Chronic obstructive pulmonary disease, unspecified: Secondary | ICD-10-CM

## 2018-05-25 DIAGNOSIS — J309 Allergic rhinitis, unspecified: Secondary | ICD-10-CM | POA: Diagnosis not present

## 2018-05-25 MED ORDER — CETIRIZINE HCL 10 MG PO TABS
10.0000 mg | ORAL_TABLET | Freq: Every day | ORAL | 6 refills | Status: DC
Start: 2018-05-25 — End: 2020-11-25

## 2018-05-25 MED ORDER — FLUTICASONE-SALMETEROL 230-21 MCG/ACT IN AERO: 2.0000 | INHALATION_SPRAY | Freq: Two times a day (BID) | RESPIRATORY_TRACT | 12 refills | Status: DC

## 2018-05-25 NOTE — Progress Notes (Signed)
Name: Emily Lozano MRN: [id number] DOB: 22-Feb-1951     CONSULTATION DATE: 3.14.19 REFERRING MD : [facility] [place]  CHIEF COMPLAINT:  abnormal CT scan   STUDIES:  CT chest 3.14.19 I have Independently reviewed images of  CT chest  3.14.19 Interpretation: Left upper lobe subcentimeter pulmonary nodules groundglass appearance 6.9 mm unchanged from previous examination with CT chest in 04/2017    HISTORY OF PRESENT ILLNESS:  67 year old pleasant white female seen today for abnormal CT scan findings and OSA She has increased mucus production and cough and not using CPAP Patient does have a diagnosis of shock of sleep apnea however has been noncompliant over the last 6 months  Patient has no shortness of breath noticed No fevers or chills No signs and symptoms of infection at this time No signs of acute heart failure at this time  I have reviewed the results with the patient and discussed the CT scan findings with the patient and she understands 6 MM nodular GGO stable over last 6 months  Current CT chest 8.19.19 reviewed with Patient No pleural effusion. No airspace consolidation, atelectasis or pneumothorax.  LUL GGO/nodule appears  stable over last 6 months Unchanged from previous exam.  Sleep Study results AHI was 60 Dx of severe sleep apnea I have explained that patient needs to start using CPAP and we will start something for mucus Most likely patient has chronic bronchitis   REVIEW OF SYSTEMS:   Constitutional: Negative for fever, chills, weight loss, malaise/fatigue and diaphoresis.  HENT: Negative for hearing loss, ear pain, nosebleeds, congestion, sore throat, neck pain, tinnitus and ear discharge.   Eyes: Negative for blurred vision, double vision, photophobia, pain, discharge and redness.  Respiratory: +cough, hemoptysis, +sputum production, shortness of breath, wheezing and stridor.   Cardiovascular: Negative for chest pain, palpitations, orthopnea,  claudication, leg swelling and PND.  Gastrointestinal: +heartburn, nausea, vomiting, abdominal pain, diarrhea, constipation, blood in stool and melena.  ALL OTHER ROS ARE NEGATIVE   Ht 5' 1.5" (1.562 m)   BMI 31.42 kg/m  BP 120/78 (BP Location: Left Arm, Cuff Size: Normal)   Pulse 74   Ht 5' 1.5" (1.562 m)   Wt 164 lb (74.4 kg)   SpO2 97%   BMI 30.49 kg/m   Physical Examination:   GENERAL:NAD, no fevers, chills, no weakness no fatigue HEAD: Normocephalic, atraumatic.  EYES: Pupils equal, round, reactive to light. Extraocular muscles intact. No scleral icterus.  MOUTH: Moist mucosal membrane.   EAR, NOSE, THROAT: Clear without exudates. No external lesions.  NECK: Supple. No thyromegaly. No nodules. No JVD.  PULMONARY:CTA B/L no wheezes, no crackles, no rhonchi CARDIOVASCULAR: S1 and S2. Regular rate and rhythm. No murmurs, rubs, or gallops. No edema.  GASTROINTESTINAL: Soft, nontender, nondistended. No masses. Positive bowel sounds.  MUSCULOSKELETAL: No swelling, clubbing, or edema. Range of motion full in all extremities.  NEUROLOGIC: Cranial nerves II through XII are intact. No gross focal neurological deficits.  SKIN: No ulceration, lesions, rashes, or cyanosis. Skin warm and dry. Turgor intact.  PSYCHIATRIC: Mood, affect within normal limits. The patient is awake, alert and oriented x 3. Insight, judgment intact.     ASSESSMENT / PLAN: 67 year old pleasant white female former smoker with a history of left arm melanoma presents today for abnormal CT chest findings with a subcentimeter 6.9 mm left upper lobe groundglass nodular opacity which at this time I would recommend a follow-up CT scan in 6 months for further progression and interval changes. There has been  no significant interval changes with current CT chest and previous CT chest however they need to be followed closely since she is a former smoker and has h/o melanoma  1. mucus production and cough Will start zyrtec  and advair HFA and see is this helps This may be caused by Chronic Bronchitis If we can control cough and mucus, then possible more compliant with CPAP  2. OSA Will need to be more compliant with CPAP  3.repeat CT chest in 1 year to assess lung nodules  Patient understands the plan of action and is satisfied with the plan of care  Patient satisfied with Plan of action and management. All questions answered Follow up in 3 months  Kayron Hicklin Patricia Pesa, M.D.  [facility]  Medical Director [place] Director [facility] Cardio-Pulmonary Department

## 2018-05-25 NOTE — Patient Instructions (Signed)
Start zyrtec 10 mg at night Start Advair   Start Using CPAP more frequently

## 2018-06-07 ENCOUNTER — Ambulatory Visit: Payer: Medicare Other

## 2018-07-23 ENCOUNTER — Telehealth: Payer: Self-pay | Admitting: Internal Medicine

## 2018-07-23 NOTE — Telephone Encounter (Signed)
[facility] calling stating they sent a requested medical records for DME on patient CPAP machine   Please call back or send in records from request.

## 2018-07-23 NOTE — Telephone Encounter (Signed)
[place], they have what they need. Nothing further at this time.

## 2020-01-16 ENCOUNTER — Other Ambulatory Visit: Payer: Self-pay | Admitting: Physician Assistant

## 2020-01-16 DIAGNOSIS — Z1231 Encounter for screening mammogram for malignant neoplasm of breast: Secondary | ICD-10-CM

## 2020-05-21 ENCOUNTER — Other Ambulatory Visit: Payer: Self-pay

## 2020-05-21 ENCOUNTER — Ambulatory Visit
Admission: RE | Admit: 2020-05-21 | Discharge: 2020-05-21 | Disposition: A | Discharge status: Home / Self Care | Payer: Medicare Other | Source: Ambulatory Visit | Attending: Physician Assistant | Admitting: Physician Assistant

## 2020-05-21 DIAGNOSIS — Z1231 Encounter for screening mammogram for malignant neoplasm of breast: Secondary | ICD-10-CM | POA: Insufficient documentation

## 2020-06-27 ENCOUNTER — Other Ambulatory Visit (HOSPITAL_COMMUNITY): Payer: Self-pay | Admitting: Physician Assistant

## 2020-06-27 ENCOUNTER — Other Ambulatory Visit: Payer: Self-pay | Admitting: Physician Assistant

## 2020-06-27 ENCOUNTER — Other Ambulatory Visit: Payer: Self-pay

## 2020-06-27 ENCOUNTER — Ambulatory Visit
Admission: RE | Admit: 2020-06-27 | Discharge: 2020-06-27 | Disposition: A | Discharge status: Home / Self Care | Payer: Medicare Other | Source: Ambulatory Visit | Attending: Physician Assistant | Admitting: Physician Assistant

## 2020-06-27 DIAGNOSIS — S0990XA Unspecified injury of head, initial encounter: Secondary | ICD-10-CM | POA: Insufficient documentation

## 2020-06-27 DIAGNOSIS — R55 Syncope and collapse: Secondary | ICD-10-CM | POA: Insufficient documentation

## 2020-06-27 DIAGNOSIS — W1800XA Striking against unspecified object with subsequent fall, initial encounter: Secondary | ICD-10-CM | POA: Diagnosis not present

## 2020-11-24 ENCOUNTER — Inpatient Hospital Stay (HOSPITAL_COMMUNITY)
Admission: EM | Admit: 2020-11-24 | Discharge: 2020-12-10 | DRG: 023 | Disposition: A | Discharge status: Skilled Nursing Facility | Payer: Medicare Other | Attending: Internal Medicine | Admitting: Internal Medicine

## 2020-11-24 ENCOUNTER — Emergency Department (HOSPITAL_COMMUNITY): Payer: Medicare Other

## 2020-11-24 ENCOUNTER — Inpatient Hospital Stay (HOSPITAL_COMMUNITY): Payer: Medicare Other

## 2020-11-24 ENCOUNTER — Encounter (HOSPITAL_COMMUNITY)
Admission: EM | Disposition: A | Discharge status: Skilled Nursing Facility | Payer: Self-pay | Source: Home / Self Care | Attending: Internal Medicine

## 2020-11-24 ENCOUNTER — Emergency Department (HOSPITAL_COMMUNITY): Payer: Medicare Other | Admitting: Anesthesiology

## 2020-11-24 DIAGNOSIS — J9601 Acute respiratory failure with hypoxia: Secondary | ICD-10-CM | POA: Diagnosis not present

## 2020-11-24 DIAGNOSIS — F419 Anxiety disorder, unspecified: Secondary | ICD-10-CM | POA: Diagnosis present

## 2020-11-24 DIAGNOSIS — Z79899 Other long term (current) drug therapy: Secondary | ICD-10-CM

## 2020-11-24 DIAGNOSIS — E876 Hypokalemia: Secondary | ICD-10-CM | POA: Diagnosis not present

## 2020-11-24 DIAGNOSIS — I161 Hypertensive emergency: Secondary | ICD-10-CM | POA: Diagnosis present

## 2020-11-24 DIAGNOSIS — Z0184 Encounter for antibody response examination: Secondary | ICD-10-CM

## 2020-11-24 DIAGNOSIS — M069 Rheumatoid arthritis, unspecified: Secondary | ICD-10-CM | POA: Diagnosis present

## 2020-11-24 DIAGNOSIS — U071 COVID-19: Secondary | ICD-10-CM

## 2020-11-24 DIAGNOSIS — D6859 Other primary thrombophilia: Secondary | ICD-10-CM | POA: Diagnosis present

## 2020-11-24 DIAGNOSIS — E1165 Type 2 diabetes mellitus with hyperglycemia: Secondary | ICD-10-CM | POA: Diagnosis present

## 2020-11-24 DIAGNOSIS — I82451 Acute embolism and thrombosis of right peroneal vein: Secondary | ICD-10-CM | POA: Diagnosis present

## 2020-11-24 DIAGNOSIS — Z6830 Body mass index (BMI) 30.0-30.9, adult: Secondary | ICD-10-CM

## 2020-11-24 DIAGNOSIS — Z85820 Personal history of malignant melanoma of skin: Secondary | ICD-10-CM

## 2020-11-24 DIAGNOSIS — R471 Dysarthria and anarthria: Secondary | ICD-10-CM | POA: Diagnosis present

## 2020-11-24 DIAGNOSIS — I10 Essential (primary) hypertension: Secondary | ICD-10-CM | POA: Diagnosis present

## 2020-11-24 DIAGNOSIS — I639 Cerebral infarction, unspecified: Secondary | ICD-10-CM | POA: Diagnosis not present

## 2020-11-24 DIAGNOSIS — R0602 Shortness of breath: Secondary | ICD-10-CM

## 2020-11-24 DIAGNOSIS — E669 Obesity, unspecified: Secondary | ICD-10-CM | POA: Diagnosis present

## 2020-11-24 DIAGNOSIS — J1282 Pneumonia due to coronavirus disease 2019: Secondary | ICD-10-CM

## 2020-11-24 DIAGNOSIS — Z7982 Long term (current) use of aspirin: Secondary | ICD-10-CM

## 2020-11-24 DIAGNOSIS — D509 Iron deficiency anemia, unspecified: Secondary | ICD-10-CM | POA: Diagnosis present

## 2020-11-24 DIAGNOSIS — G8191 Hemiplegia, unspecified affecting right dominant side: Secondary | ICD-10-CM | POA: Diagnosis present

## 2020-11-24 DIAGNOSIS — R1312 Dysphagia, oropharyngeal phase: Secondary | ICD-10-CM | POA: Diagnosis present

## 2020-11-24 DIAGNOSIS — K219 Gastro-esophageal reflux disease without esophagitis: Secondary | ICD-10-CM | POA: Diagnosis present

## 2020-11-24 DIAGNOSIS — T80818A Extravasation of other vesicant agent, initial encounter: Secondary | ICD-10-CM | POA: Diagnosis not present

## 2020-11-24 DIAGNOSIS — I63412 Cerebral infarction due to embolism of left middle cerebral artery: Secondary | ICD-10-CM | POA: Diagnosis present

## 2020-11-24 DIAGNOSIS — R29717 NIHSS score 17: Secondary | ICD-10-CM | POA: Diagnosis present

## 2020-11-24 DIAGNOSIS — R739 Hyperglycemia, unspecified: Secondary | ICD-10-CM | POA: Diagnosis not present

## 2020-11-24 DIAGNOSIS — I674 Hypertensive encephalopathy: Secondary | ICD-10-CM | POA: Diagnosis present

## 2020-11-24 DIAGNOSIS — Z978 Presence of other specified devices: Secondary | ICD-10-CM

## 2020-11-24 DIAGNOSIS — J9600 Acute respiratory failure, unspecified whether with hypoxia or hypercapnia: Secondary | ICD-10-CM | POA: Diagnosis not present

## 2020-11-24 DIAGNOSIS — Z90710 Acquired absence of both cervix and uterus: Secondary | ICD-10-CM

## 2020-11-24 DIAGNOSIS — I4891 Unspecified atrial fibrillation: Secondary | ICD-10-CM | POA: Diagnosis not present

## 2020-11-24 DIAGNOSIS — Z20822 Contact with and (suspected) exposure to covid-19: Secondary | ICD-10-CM | POA: Diagnosis not present

## 2020-11-24 DIAGNOSIS — J384 Edema of larynx: Secondary | ICD-10-CM | POA: Diagnosis not present

## 2020-11-24 DIAGNOSIS — G4730 Sleep apnea, unspecified: Secondary | ICD-10-CM | POA: Diagnosis present

## 2020-11-24 DIAGNOSIS — R4701 Aphasia: Secondary | ICD-10-CM | POA: Diagnosis present

## 2020-11-24 DIAGNOSIS — E785 Hyperlipidemia, unspecified: Secondary | ICD-10-CM | POA: Diagnosis present

## 2020-11-24 DIAGNOSIS — Z7984 Long term (current) use of oral hypoglycemic drugs: Secondary | ICD-10-CM

## 2020-11-24 DIAGNOSIS — Z87891 Personal history of nicotine dependence: Secondary | ICD-10-CM

## 2020-11-24 DIAGNOSIS — R1310 Dysphagia, unspecified: Secondary | ICD-10-CM | POA: Diagnosis not present

## 2020-11-24 DIAGNOSIS — Z885 Allergy status to narcotic agent status: Secondary | ICD-10-CM

## 2020-11-24 DIAGNOSIS — I480 Paroxysmal atrial fibrillation: Secondary | ICD-10-CM | POA: Diagnosis not present

## 2020-11-24 DIAGNOSIS — F32A Depression, unspecified: Secondary | ICD-10-CM | POA: Diagnosis present

## 2020-11-24 DIAGNOSIS — I6389 Other cerebral infarction: Secondary | ICD-10-CM | POA: Diagnosis not present

## 2020-11-24 HISTORY — PX: RADIOLOGY WITH ANESTHESIA: SHX6223

## 2020-11-24 HISTORY — PX: IR CT HEAD LTD: IMG2386

## 2020-11-24 HISTORY — PX: IR US GUIDE VASC ACCESS RIGHT: IMG2390

## 2020-11-24 HISTORY — PX: IR PERCUTANEOUS ART THROMBECTOMY/INFUSION INTRACRANIAL INC DIAG ANGIO: IMG6087

## 2020-11-24 LAB — PROTIME-INR

## 2020-11-24 LAB — CBC
HCT: 37.5 % (ref 36.0–46.0)
HCT: 40.3 % (ref 36.0–46.0)
Hemoglobin: 11.7 g/dL — ABNORMAL LOW (ref 12.0–15.0)
Hemoglobin: 11.9 g/dL — ABNORMAL LOW (ref 12.0–15.0)
MCH: 25.2 pg — ABNORMAL LOW (ref 26.0–34.0)
MCH: 24.1 pg — ABNORMAL LOW (ref 26.0–34.0)
MCHC: 31.2 g/dL (ref 30.0–36.0)
MCHC: 29.5 g/dL — ABNORMAL LOW (ref 30.0–36.0)
MCV: 80.6 fL (ref 80.0–100.0)
MCV: 81.6 fL (ref 80.0–100.0)
Platelets: 403 10*3/uL — ABNORMAL HIGH (ref 150–400)
Platelets: 136 10*3/uL — ABNORMAL LOW (ref 150–400)
RBC: 4.65 MIL/uL (ref 3.87–5.11)
RBC: 4.94 MIL/uL (ref 3.87–5.11)
RDW: 15.2 % (ref 11.5–15.5)
RDW: 15.1 % (ref 11.5–15.5)
WBC: 5.5 10*3/uL (ref 4.0–10.5)
WBC: 5.3 10*3/uL (ref 4.0–10.5)
nRBC: 0 % (ref 0.0–0.2)
nRBC: 0 % (ref 0.0–0.2)

## 2020-11-24 LAB — RESP PANEL BY RT-PCR (FLU A&B, COVID) ARPGX2
Influenza A by PCR: NEGATIVE
Influenza B by PCR: NEGATIVE
SARS Coronavirus 2 by RT PCR: POSITIVE — AB

## 2020-11-24 LAB — LACTIC ACID, PLASMA: Lactic Acid, Venous: 2.4 mmol/L (ref 0.5–1.9)

## 2020-11-24 LAB — DIFFERENTIAL
Abs Immature Granulocytes: 0.05 10*3/uL (ref 0.00–0.07)
Basophils Absolute: 0 10*3/uL (ref 0.0–0.1)
Basophils Relative: 0 %
Eosinophils Absolute: 0 10*3/uL (ref 0.0–0.5)
Eosinophils Relative: 1 %
Immature Granulocytes: 1 %
Lymphocytes Relative: 15 %
Lymphs Abs: 0.8 10*3/uL (ref 0.7–4.0)
Monocytes Absolute: 0.5 10*3/uL (ref 0.1–1.0)
Monocytes Relative: 9 %
Neutro Abs: 4.1 10*3/uL (ref 1.7–7.7)
Neutrophils Relative %: 74 %

## 2020-11-24 LAB — COMPREHENSIVE METABOLIC PANEL
ALT: 13 U/L (ref 0–44)
AST: 18 U/L (ref 15–41)
Albumin: 2.9 g/dL — ABNORMAL LOW (ref 3.5–5.0)
Alkaline Phosphatase: 55 U/L (ref 38–126)
Anion gap: 13 (ref 5–15)
BUN: 13 mg/dL (ref 8–23)
CO2: 19 mmol/L — ABNORMAL LOW (ref 22–32)
Calcium: 8.6 mg/dL — ABNORMAL LOW (ref 8.9–10.3)
Chloride: 105 mmol/L (ref 98–111)
Creatinine, Ser: 0.75 mg/dL (ref 0.44–1.00)
GFR, Estimated: >60 mL/min (ref >60)
Glucose, Bld: 285 mg/dL — ABNORMAL HIGH (ref 70–99)
Potassium: 3.7 mmol/L (ref 3.5–5.1)
Sodium: 137 mmol/L (ref 135–145)
Total Bilirubin: 0.7 mg/dL (ref 0.3–1.2)
Total Protein: 6.9 g/dL (ref 6.5–8.1)

## 2020-11-24 LAB — POCT I-STAT 7, (LYTES, BLD GAS, ICA,H+H)
Acid-Base Excess: 0 mmol/L (ref 0.0–2.0)
Bicarbonate: 24.1 mmol/L (ref 20.0–28.0)
Calcium, Ion: 1.15 mmol/L (ref 1.15–1.40)
HCT: 32 % — ABNORMAL LOW (ref 36.0–46.0)
Hemoglobin: 10.9 g/dL — ABNORMAL LOW (ref 12.0–15.0)
O2 Saturation: 98 %
Patient temperature: 99
Potassium: 2.9 mmol/L — ABNORMAL LOW (ref 3.5–5.1)
Sodium: 144 mmol/L (ref 135–145)
TCO2: 25 mmol/L (ref 22–32)
pCO2 arterial: 37.4 mmHg (ref 32.0–48.0)
pH, Arterial: 7.418 (ref 7.350–7.450)
pO2, Arterial: 97 mmHg (ref 83.0–108.0)

## 2020-11-24 LAB — I-STAT CHEM 8, ED
BUN: 16 mg/dL (ref 8–23)
Calcium, Ion: 0.95 mmol/L — ABNORMAL LOW (ref 1.15–1.40)
Chloride: 106 mmol/L (ref 98–111)
Creatinine, Ser: 0.6 mg/dL (ref 0.44–1.00)
Glucose, Bld: 288 mg/dL — ABNORMAL HIGH (ref 70–99)
HCT: 35 % — ABNORMAL LOW (ref 36.0–46.0)
Hemoglobin: 11.9 g/dL — ABNORMAL LOW (ref 12.0–15.0)
Potassium: 3.8 mmol/L (ref 3.5–5.1)
Sodium: 141 mmol/L (ref 135–145)
TCO2: 26 mmol/L (ref 22–32)

## 2020-11-24 LAB — GLUCOSE, CAPILLARY: Glucose-Capillary: 164 mg/dL — ABNORMAL HIGH (ref 70–99)

## 2020-11-24 LAB — HEMOGLOBIN A1C
Hgb A1c MFr Bld: 10.4 % — ABNORMAL HIGH (ref 4.8–5.6)
Mean Plasma Glucose: 251.78 mg/dL

## 2020-11-24 LAB — MRSA PCR SCREENING: MRSA by PCR: NEGATIVE

## 2020-11-24 LAB — PROCALCITONIN: Procalcitonin: <0.1 ng/mL

## 2020-11-24 SURGERY — IR WITH ANESTHESIA
Anesthesia: General

## 2020-11-24 MED ORDER — ONDANSETRON HCL 4 MG/2ML IJ SOLN
INTRAMUSCULAR | Status: DC | PRN
  Administered 2020-11-24: 4 mg via INTRAVENOUS

## 2020-11-24 MED ORDER — CLEVIDIPINE BUTYRATE 0.5 MG/ML IV EMUL
0.0000 mg/h | INTRAVENOUS | Status: DC
  Administered 2020-11-25: 20:00:00 10 mg/h via INTRAVENOUS
  Administered 2020-11-25: 19:00:00 16 mg/h via INTRAVENOUS
  Administered 2020-11-25: 09:00:00 1 mg/h via INTRAVENOUS
  Administered 2020-11-25: 11:00:00 16 mg/h via INTRAVENOUS
  Administered 2020-11-25: 22:00:00 21 mg/h via INTRAVENOUS
  Administered 2020-11-25: 4 mg/h via INTRAVENOUS
  Administered 2020-11-25: 21 mg/h via INTRAVENOUS
  Administered 2020-11-25 (×3): 16 mg/h via INTRAVENOUS
  Administered 2020-11-26 (×2): 21 mg/h via INTRAVENOUS
  Administered 2020-11-26: 12 mg/h via INTRAVENOUS
  Administered 2020-11-26: 21 mg/h via INTRAVENOUS
  Administered 2020-11-27: 10 mg/h via INTRAVENOUS
  Administered 2020-11-27 (×2): 2 mg/h via INTRAVENOUS
  Administered 2020-11-28 (×2): 8 mg/h via INTRAVENOUS
  Administered 2020-11-28: 10 mg/h via INTRAVENOUS
  Administered 2020-11-29: 6 mg/h via INTRAVENOUS
  Administered 2020-11-29 – 2020-11-30 (×2): 2 mg/h via INTRAVENOUS
  Filled 2020-11-24 (×4): qty 50
  Filled 2020-11-24: qty 100
  Filled 2020-11-24 (×12): qty 50
  Filled 2020-11-24: qty 100
  Filled 2020-11-24 (×5): qty 50

## 2020-11-24 MED ORDER — TICAGRELOR 90 MG PO TABS
ORAL_TABLET | ORAL | Status: AC
  Filled 2020-11-24: qty 2

## 2020-11-24 MED ORDER — LIDOCAINE 2% (20 MG/ML) 5 ML SYRINGE
INTRAMUSCULAR | Status: DC | PRN
  Administered 2020-11-24: 60 mg via INTRAVENOUS

## 2020-11-24 MED ORDER — CHLORHEXIDINE GLUCONATE 0.12% ORAL RINSE (MEDLINE KIT)
15.0000 mL | Freq: Two times a day (BID) | OROMUCOSAL | Status: DC
  Administered 2020-11-25 – 2020-11-29 (×11): 15 mL via OROMUCOSAL

## 2020-11-24 MED ORDER — SODIUM CHLORIDE 0.9 % IV SOLN
100.0000 mg | Freq: Every day | INTRAVENOUS | Status: AC
  Administered 2020-11-25 – 2020-11-28 (×4): 100 mg via INTRAVENOUS
  Filled 2020-11-24 (×3): qty 20
  Filled 2020-11-24: qty 1.96

## 2020-11-24 MED ORDER — SODIUM CHLORIDE 0.9 % IV SOLN: INTRAVENOUS | Status: DC | PRN

## 2020-11-24 MED ORDER — IOHEXOL 350 MG/ML SOLN
60.0000 mL | Freq: Once | INTRAVENOUS | Status: AC | PRN
  Administered 2020-11-24: 60 mL via INTRAVENOUS

## 2020-11-24 MED ORDER — STROKE: EARLY STAGES OF RECOVERY BOOK
Freq: Once | Status: AC
  Filled 2020-11-24: qty 1

## 2020-11-24 MED ORDER — INSULIN ASPART 100 UNIT/ML ~~LOC~~ SOLN
0.0000 [IU] | SUBCUTANEOUS | Status: DC
  Administered 2020-11-24 – 2020-11-25 (×2): 4 [IU] via SUBCUTANEOUS
  Administered 2020-11-25: 7 [IU] via SUBCUTANEOUS
  Administered 2020-11-25: 11 [IU] via SUBCUTANEOUS
  Administered 2020-11-25: 3 [IU] via SUBCUTANEOUS
  Administered 2020-11-25: 4 [IU] via SUBCUTANEOUS
  Administered 2020-11-25: 7 [IU] via SUBCUTANEOUS
  Administered 2020-11-26 (×2): 11 [IU] via SUBCUTANEOUS
  Administered 2020-11-26: 7 [IU] via SUBCUTANEOUS
  Administered 2020-11-26 (×2): 15 [IU] via SUBCUTANEOUS
  Administered 2020-11-26 – 2020-11-27 (×2): 20 [IU] via SUBCUTANEOUS
  Administered 2020-11-27: 15 [IU] via SUBCUTANEOUS
  Administered 2020-11-27: 20 [IU] via SUBCUTANEOUS
  Administered 2020-11-27 (×3): 15 [IU] via SUBCUTANEOUS
  Administered 2020-11-28: 7 [IU] via SUBCUTANEOUS
  Administered 2020-11-28 (×2): 11 [IU] via SUBCUTANEOUS
  Administered 2020-11-28: 15 [IU] via SUBCUTANEOUS
  Administered 2020-11-28: 7 [IU] via SUBCUTANEOUS
  Administered 2020-11-28: 11 [IU] via SUBCUTANEOUS
  Administered 2020-11-29 (×2): 3 [IU] via SUBCUTANEOUS
  Administered 2020-11-29 (×2): 4 [IU] via SUBCUTANEOUS
  Administered 2020-11-30: 7 [IU] via SUBCUTANEOUS
  Administered 2020-11-30: 3 [IU] via SUBCUTANEOUS
  Administered 2020-11-30: 7 [IU] via SUBCUTANEOUS
  Administered 2020-12-01: 4 [IU] via SUBCUTANEOUS

## 2020-11-24 MED ORDER — PANTOPRAZOLE SODIUM 40 MG IV SOLR
40.0000 mg | INTRAVENOUS | Status: DC
  Administered 2020-11-24 – 2020-11-25 (×2): 40 mg via INTRAVENOUS
  Filled 2020-11-24 (×2): qty 40

## 2020-11-24 MED ORDER — MOMETASONE FURO-FORMOTEROL FUM 100-5 MCG/ACT IN AERO
2.0000 | INHALATION_SPRAY | Freq: Two times a day (BID) | RESPIRATORY_TRACT | Status: DC
  Administered 2020-11-30 – 2020-12-10 (×18): 2 via RESPIRATORY_TRACT
  Filled 2020-11-24: qty 8.8

## 2020-11-24 MED ORDER — TIROFIBAN HCL IN NACL 5-0.9 MG/100ML-% IV SOLN
INTRAVENOUS | Status: AC
  Filled 2020-11-24: qty 100

## 2020-11-24 MED ORDER — SODIUM CHLORIDE 0.9 % IV SOLN: INTRAVENOUS | Status: DC

## 2020-11-24 MED ORDER — DEXTROSE 50 % IV SOLN: 0.0000 mL | INTRAVENOUS | Status: DC | PRN

## 2020-11-24 MED ORDER — SENNOSIDES-DOCUSATE SODIUM 8.6-50 MG PO TABS: 1.0000 | ORAL_TABLET | Freq: Every evening | ORAL | Status: DC | PRN

## 2020-11-24 MED ORDER — PHENYLEPHRINE HCL-NACL 10-0.9 MG/250ML-% IV SOLN
INTRAVENOUS | Status: DC | PRN
  Administered 2020-11-24: 20 ug/min via INTRAVENOUS

## 2020-11-24 MED ORDER — ACETAMINOPHEN 160 MG/5ML PO SOLN
650.0000 mg | ORAL | Status: DC | PRN
  Administered 2020-11-25 – 2020-11-28 (×9): 650 mg
  Filled 2020-11-24 (×10): qty 20.3

## 2020-11-24 MED ORDER — INSULIN REGULAR(HUMAN) IN NACL 100-0.9 UT/100ML-% IV SOLN
INTRAVENOUS | Status: DC | PRN
  Administered 2020-11-24: 6.5 [IU]/h via INTRAVENOUS

## 2020-11-24 MED ORDER — IOHEXOL 240 MG/ML SOLN
150.0000 mL | Freq: Once | INTRAMUSCULAR | Status: AC | PRN
  Administered 2020-11-24: 75 mL via INTRA_ARTERIAL

## 2020-11-24 MED ORDER — PROPOFOL 500 MG/50ML IV EMUL
INTRAVENOUS | Status: DC | PRN
  Administered 2020-11-24: 50 ug/kg/min via INTRAVENOUS

## 2020-11-24 MED ORDER — SODIUM CHLORIDE 0.9% FLUSH: 3.0000 mL | Freq: Once | INTRAVENOUS | Status: DC

## 2020-11-24 MED ORDER — IOHEXOL 350 MG/ML SOLN
40.0000 mL | Freq: Once | INTRAVENOUS | Status: AC | PRN
  Administered 2020-11-24: 40 mL via INTRAVENOUS

## 2020-11-24 MED ORDER — ACETAMINOPHEN 325 MG PO TABS
650.0000 mg | ORAL_TABLET | ORAL | Status: DC | PRN
  Administered 2020-11-26 – 2020-12-10 (×6): 650 mg via ORAL
  Filled 2020-11-24 (×6): qty 2

## 2020-11-24 MED ORDER — NITROGLYCERIN 1 MG/10 ML FOR IR/CATH LAB
INTRA_ARTERIAL | Status: AC
  Filled 2020-11-24: qty 10

## 2020-11-24 MED ORDER — ORAL CARE MOUTH RINSE
15.0000 mL | OROMUCOSAL | Status: DC
  Administered 2020-11-25 – 2020-11-26 (×13): 15 mL via OROMUCOSAL

## 2020-11-24 MED ORDER — ACETAMINOPHEN 650 MG RE SUPP: 650.0000 mg | RECTAL | Status: DC | PRN

## 2020-11-24 MED ORDER — ASPIRIN 81 MG PO CHEW
CHEWABLE_TABLET | ORAL | Status: AC
  Filled 2020-11-24: qty 1

## 2020-11-24 MED ORDER — CANGRELOR TETRASODIUM 50 MG IV SOLR
INTRAVENOUS | Status: AC
  Filled 2020-11-24: qty 50

## 2020-11-24 MED ORDER — SODIUM CHLORIDE 0.9 % IV SOLN
200.0000 mg | Freq: Once | INTRAVENOUS | Status: AC
  Administered 2020-11-24: 200 mg via INTRAVENOUS
  Filled 2020-11-24: qty 40

## 2020-11-24 MED ORDER — VERAPAMIL HCL 2.5 MG/ML IV SOLN
INTRAVENOUS | Status: AC
  Filled 2020-11-24: qty 2

## 2020-11-24 MED ORDER — INSULIN REGULAR(HUMAN) IN NACL 100-0.9 UT/100ML-% IV SOLN
INTRAVENOUS | Status: DC
  Filled 2020-11-24: qty 100

## 2020-11-24 MED ORDER — PROPOFOL 1000 MG/100ML IV EMUL
INTRAVENOUS | Status: AC
  Filled 2020-11-24: qty 100

## 2020-11-24 MED ORDER — PROPOFOL 1000 MG/100ML IV EMUL
5.0000 ug/kg/min | INTRAVENOUS | Status: DC
  Administered 2020-11-25 (×2): 80 ug/kg/min via INTRAVENOUS
  Filled 2020-11-24: qty 100
  Filled 2020-11-24: qty 200

## 2020-11-24 MED ORDER — PROPOFOL 10 MG/ML IV BOLUS
INTRAVENOUS | Status: DC | PRN
  Administered 2020-11-24: 200 mg via INTRAVENOUS

## 2020-11-24 MED ORDER — EPTIFIBATIDE 20 MG/10ML IV SOLN
INTRAVENOUS | Status: AC
  Filled 2020-11-24: qty 10

## 2020-11-24 MED ORDER — SUCCINYLCHOLINE CHLORIDE 200 MG/10ML IV SOSY
PREFILLED_SYRINGE | INTRAVENOUS | Status: DC | PRN
  Administered 2020-11-24: 60 mg via INTRAVENOUS

## 2020-11-24 MED ORDER — IOHEXOL 240 MG/ML SOLN
INTRAMUSCULAR | Status: AC
  Filled 2020-11-24: qty 200

## 2020-11-24 MED ORDER — CLOPIDOGREL BISULFATE 300 MG PO TABS
ORAL_TABLET | ORAL | Status: AC
  Filled 2020-11-24: qty 1

## 2020-11-24 MED ORDER — ROCURONIUM BROMIDE 10 MG/ML (PF) SYRINGE
PREFILLED_SYRINGE | INTRAVENOUS | Status: DC | PRN
  Administered 2020-11-24: 40 mg via INTRAVENOUS
  Administered 2020-11-24: 30 mg via INTRAVENOUS

## 2020-11-24 NOTE — Anesthesia Postprocedure Evaluation (Signed)
Anesthesia Post Note  Patient: Emily Lozano  Procedure(s) Performed: IR WITH ANESTHESIA (N/A )     Patient location during evaluation: ICU Anesthesia Type: General Level of consciousness: sedated Pain management: pain level controlled Vital Signs Assessment: post-procedure vital signs reviewed and stable Respiratory status: patient remains intubated per anesthesia plan Cardiovascular status: stable Postop Assessment: no apparent nausea or vomiting Anesthetic complications: no   No complications documented.  Last Vitals:  Vitals:   11/24/20 1927 11/24/20 1935  BP:    Pulse: 69   Resp: 11   Temp:  37.2 C  SpO2: 100% 99%    Last Pain:  Vitals:   11/24/20 1935  TempSrc: Oral                 Ryan P Ellender

## 2020-11-24 NOTE — Progress Notes (Signed)
eLink Physician-Brief Progress Note Patient Name: Emily Lozano DOB: March 24, 1951 MRN: [id number]   Date of Service  11/24/2020  HPI/Events of Note  Patient arrives in ICU on Propofol IV infusion. No orders for Propofol IV infusion.   eICU Interventions  Plan: 1. Propofol IV infusion. Titrate to RASS = 0 to -1.     Intervention Category Major Interventions: Delirium, psychosis, severe agitation - evaluation and management  Nishaan Stanke [place] 11/24/2020, 8:25 PM

## 2020-11-24 NOTE — Anesthesia Procedure Notes (Signed)
Procedure Name: Intubation Date/Time: 11/24/2020 4:59 PM Performed by: Rande Brunt, CRNA Pre-anesthesia Checklist: Patient identified, Emergency Drugs available, Suction available and Patient being monitored Patient Re-evaluated:Patient Re-evaluated prior to induction Oxygen Delivery Method: Circle System Utilized Preoxygenation: Pre-oxygenation with 100% oxygen Induction Type: IV induction and Rapid sequence Ventilation: Mask ventilation without difficulty Laryngoscope Size: Glidescope and 3 Tube type: Oral Tube size: 7.5 mm Number of attempts: 1 Airway Equipment and Method: Stylet and Video-laryngoscopy Placement Confirmation: ETT inserted through vocal cords under direct vision,  positive ETCO2 and breath sounds checked- equal and bilateral Secured at: 21 cm Tube secured with: Tape Dental Injury: Teeth and Oropharynx as per pre-operative assessment

## 2020-11-24 NOTE — Consult Note (Signed)
NAME:  Emily Lozano, MRN:  [id number], DOB:  March 09, 1951, LOS: 0 ADMISSION DATE:  11/24/2020, CONSULTATION DATE:  11/24/2020  REFERRING MD:  Rory Percy - neuro, CHIEF COMPLAINT:  L M2 CVA, Covid infection, respiratory failure  Brief History:  70 yo admitted 2/26 with L M2 occlusion, outside of tpa window. Went to [facility] for thrombectomy, unsuccessful, c/b bleed.Resulted + for Covid in NIR, will remain intubated. Reportedly has had some mild URI sx x 1-2 wks (had tested negative OP in this time frame). hypertension, hyperlipidemia, type 2 diabetes mellitus, depression, GERD, melanoma, sleep apnea, and recent exposure to COVID-19  History of Present Illness:  70 yo F PMH HTN HLD Dm2 who presented as a code stroke 2/26 with acute R hemiparesis, L gaze preference, aphasia. KWN 10:30 AM -- outside of window for tpa. CTA head reveals L M2 occlusion, CTA cp with L MCA ischemia. Taken to [facility] for thrombectomy -- c/b bleed, thrombectomy unsuccessful.In NIR, COVID-19 resulted positive. Reportedly, pts family had +Covid recently, pt had URI sx and was tested for COVID 2/25 - and was negative, started on ZPack at that time.  Repeat neuroimaging is pending during [place] will be to remain intubated following case and transfer to ICU. PCCM consulted in this setting   Past Medical History:  HTN HLD DM2    has a past medical history of Anemia, Arthritis, Collagen vascular disease ([place]), Depression, Diabetes mellitus without complication ([place]), Hypertension, and Melanoma ([place]) (2014).   reports that she has quit smoking. She has never used smokeless tobacco.  Past Surgical History:  Procedure Laterality Date  . ABDOMINAL HYSTERECTOMY    . BREAST LUMPECTOMY    . CARPAL TUNNEL RELEASE    . ESOPHAGEAL MANOMETRY N/A 12/05/2015   Procedure: ESOPHAGEAL MANOMETRY (EM);  Surgeon: Josefine Class, MD;  Location: [facility] ENDOSCOPY;  Service: Endoscopy;  Laterality: N/A;  . ESOPHAGOGASTRODUODENOSCOPY (EGD) WITH  PROPOFOL N/A 11/26/2015   Procedure: ESOPHAGOGASTRODUODENOSCOPY (EGD) WITH PROPOFOL;  Surgeon: Lollie Sails, MD;  Location: [facility] ENDOSCOPY;  Service: Endoscopy;  Laterality: N/A;  . ESOPHAGOGASTRODUODENOSCOPY (EGD) WITH PROPOFOL N/A 04/30/2017   Procedure: ESOPHAGOGASTRODUODENOSCOPY (EGD) WITH PROPOFOL;  Surgeon: Lollie Sails, MD;  Location: [facility] ENDOSCOPY;  Service: Endoscopy;  Laterality: N/A;  . EXCISION MELANOMA WITH SENTINEL LYMPH NODE BIOPSY      Allergies  Allergen Reactions  . Morphine And Related Nausea And Vomiting    Immunization History  Administered Date(s) Administered  . PFIZER(Purple Top)SARS-COV-2 Vaccination 01/04/2020, 01/25/2020    No family history on file.   Current Facility-Administered Medications:  .   stroke: mapping our early stages of recovery book, , Does not apply, Once, Amie Portland, MD .  0.9 %  sodium chloride infusion, , Intravenous, Continuous, Amie Portland, MD, [place] at 11/24/20 1645 .  acetaminophen (TYLENOL) tablet 650 mg, 650 mg, Oral, Q4H PRN **OR** acetaminophen (TYLENOL) 160 MG/5ML solution 650 mg, 650 mg, Per Tube, Q4H PRN **OR** acetaminophen (TYLENOL) suppository 650 mg, 650 mg, Rectal, Q4H PRN, Amie Portland, MD .  clevidipine (CLEVIPREX) infusion 0.5 mg/mL, 0-21 mg/hr, Intravenous, Continuous, de Sindy Messing, Dell, MD .  dextrose 50 % solution 0-50 mL, 0-50 mL, Intravenous, PRN, Ellender, Karyl Kinnier, MD .  insulin regular, human (MYXREDLIN) 100 units/ 100 mL infusion, , Intravenous, Continuous, Ellender, Ryan P, MD .  iohexol (OMNIPAQUE) 240 MG/ML injection, , , ,  .  propofol (DIPRIVAN) 1000 MG/100ML infusion, , , ,  .  propofol (DIPRIVAN) 1000 MG/100ML infusion,  5-80 mcg/kg/min, Intravenous, Titrated, Anders Simmonds, MD .  senna-docusate (Senokot-S) tablet 1 tablet, 1 tablet, Oral, QHS PRN, Amie Portland, MD .  sodium chloride flush (NS) 0.9 % injection 3 mL, 3 mL, Intravenous, Once, Hayden Rasmussen, MD .   ticagrelor (BRILINTA) 90 MG tablet, , , ,     Significant Hospital Events:  11/24/2020 - admit => Left distal M2/MCA middle division branch occlusion vi RCFA. : There was a left M2/MCA middle division branch occlusion with likely atherosclerotic plaque underlying. Two direct contact aspiration passes performed with recanalization of the proximal aspect of the M2 branch and persistent distal occlusion. Two passes were then performed with stent retriever with persistent occlusion. Flat panel head CT showed contrast extravasation in the left sylvian fissure. Delayed angiograms and delayed flat panel CT showed no evidence of active extravasation.  Consults:  11/24/2020  - ccm  Procedures:  2/26 - see events  Significant Diagnostic Tests:  2.26 - see evnts  Micro Data:  11/24/2020 - covid +v  Antimicrobials:  2/26 - remdesivir  Interim History / Subjective:  2/.26 - seen by CCM in [facility] [place] [facility]. She is on ventilator. . On diprivan. OFf neo. Per RN - got started on insulin gtt by anesthesia. Pulse ox was 97% RA pre intubation  Objective   Blood pressure (!) 172/80, pulse 78, resp. rate 14, SpO2 97 %.       No intake or output data in the 24 hours ending 11/24/20 1822 There were no vitals filed for this visit.  Examination: General: obese female on ventilator. Lying flat HENT: ETT tube + Lungs: CTA bialterally. 40% fio2,, sync with vent Cardiovascular: normal heart sounds Abdomen: soft Extremities: intact Neuro: left side with good tone. Right side flaccid GU: not examined  Resolved Hospital Problem list   x  Assessment & Plan:  ASSESSMENT / PLAN:   A:  Hx of ? Asthma - on advair at h ome Acute respiratory failure post op due to stroke in setting of covid. No evidence of covid respiratory failure at admit thought CT head images showed infiltrates in upper lobes  11/24/2020 -> sedated on vent  P:   PRVC VAP bundle CXR [place]  NEUROLOGIC A:    Depression/Anxiety on lexapro and wellbutrin at homoe  STroke admit 11/24/2020 - Left M2 MCA. NIH stroke score 16 at admit. - in setting of covid and other risk factors - covid coagulopathy  - Out of window for tPA  - s/PLAN: IR PERCUTANEOUS ART THROMBECTOMY 2/.26/22 s/p partial recandalicaation (proxima but persistent distal occlusion) and procedure stopped due to extravasation   - s/p brilinta zx 1  SEdation needs on vent  P:   Per Neuro - Antiplatelt on hold due to extravasation RASS goall 0 to -2; Diprivan gtt Hold off lexapro and wellburtin Rpeat CNS imaging 1am 11/25/20    A:   Baseline hypertension in losartan and lopressor AT risk for covid related DVT  P:  SBP goal < 220 permissive hypertension due to partial revascularization   - d/w Dr Curly [place]  - will need to be readdressed based on repeat CT Cleviprex gtt if needed (currently diprivan for sedation that can help BP) for sbp > 220 Labetalol prn Hydralazine prn Check Duplex LE 11/25/20 Check D-dimer 11/25/20 DVT proph lovenox based on repeat CT (d/w Dr Curly Sh[place])   A: At risk MI  P: Check echo Check trop   A:   COVID +ve 11/24/20 (Pfizer x 2 -  last dose April 2021) - pulse ox 97% RA Immunosupprssed on methotrexate  P:   Check Covid IgG -> if negative ? Role for Evusheld as inpatient  Start remdesivir STeroids and Barcitinib if PF ratio declines Check urine streop, legionela, PCT,  Check Quant Gold   A:  At Risk AKI P:  hydratge   A:  At risk electrolyte imbalance P: Goal mag > 2 Goal K 4-5 Goal Phos is normal   A:   At risk stress ulcer  P:   ppi   A:  At risk anemia of critical illness   P:  - PRBC for hgb </= 6.9gm%    - exceptions are   -  if ACS susepcted/confirmed then transfuse for hgb </= 8.0gm%,  or    -  active bleeding with hemodynamic instability, then transfuse regardless of hemoglobin value   At at all times try to transfuse 1 unit prbc as possible with exception of  active hemorrhage    A AT risk thrombocytopenia  P monitir esp if lovenox started  ENDOCRINE A:   DM on Metformin\ and Amaryl at home    P:   Check hgba1c ssi  History of Rheumatoid arthritis Immune suppressed - on Plaquenil and Methotrexate  Plan  - hold mtx   Best practice (evaluated daily)  Diet: npo Pain/Anxiety/Delirium protocol (if indicated): diprivan VAP protocol (if indicated): yes DVT prophylaxis: scd + (lovenox tbd by neuro after repeat ct) GI prophylaxis: ppi Glucose control: ssi Mobility: BR Disposition: ICU   Goals of Care:  Last date of multidisciplinary goals of care discussion: --  Family and staff present: -- Summary of discussion: -- Follow up goals of care discussion due: 3/2 Code Status: full      LABS    PULMONARY Recent Labs  Lab 11/24/20 1604 11/24/20 2036  PHART  --  7.418  PCO2ART  --  37.4  PO2ART  --  97  HCO3  --  24.1  TCO2 26 25  O2SAT  --  98.0    CBC Recent Labs  Lab 11/24/20 1602 11/24/20 1604 11/24/20 2036  HGB 11.7* 11.9* 10.9*  HCT 37.5 35.0* 32.0*  WBC 5.5  --   --   PLT 403*  --   --     COAGULATION Recent Labs  Lab 11/24/20 1602  INR SPECIMEN CLOTTED    CARDIAC  No results for input(s): TROPONINI in the last 168 hours. No results for input(s): PROBNP in the last 168 hours.   CHEMISTRY Recent Labs  Lab 11/24/20 1602 11/24/20 1604 11/24/20 2036  NA 137 141 144  K 3.7 3.8 2.9*  CL 105 106  --   CO2 19*  --   --   GLUCOSE 285* 288*  --   BUN 13 16  --   CREATININE 0.75 0.60  --   CALCIUM 8.6*  --   --    Estimated Creatinine Clearance: 67.9 mL/min (by C-G formula based on SCr of 0.6 mg/dL).   LIVER Recent Labs  Lab 11/24/20 1602  AST 18  ALT 13  ALKPHOS 55  BILITOT 0.7  PROT 6.9  ALBUMIN 2.9*  INR SPECIMEN CLOTTED     INFECTIOUS No results for input(s): LATICACIDVEN, PROCALCITON in the last 168 hours.   ENDOCRINE CBG (last 3)  Recent Labs    11/24/20 2033   GLUCAP 164*         IMAGING x48h  - image(s) personally visualized  -   highlighted  in bold CT CEREBRAL PERFUSION W CONTRAST  Result Date: 11/24/2020 CLINICAL DATA:  Neuro deficit, acute, stroke suspected EXAM: CT PERFUSION BRAIN TECHNIQUE: Multiphase CT imaging of the brain was performed following IV bolus contrast injection. Subsequent parametric perfusion maps were calculated using RAPID software. CONTRAST:  46mL OMNIPAQUE IOHEXOL 350 MG/ML SOLN COMPARISON:  Concurrent CTA head and neck. FINDINGS: CT Brain Perfusion Findings: CBF (<30%) Volume: 96mL Perfusion (Tmax>6.0s) volume: 39mL Mismatch Volume: 51mL ASPECTS on noncontrast CT Head: 9 at 16:42 today. Infarct Core: 0 mL Infarction Location:Not applicable. IMPRESSION: Perfusion imaging demonstrates no infarct core. 30 mL region of ischemia involving the left MCA territory. Electronically Signed   By: Primitivo Gauze M.D.   On: 11/24/2020 16:55   CT HEAD CODE STROKE WO CONTRAST  Result Date: 11/24/2020 CLINICAL DATA:  Code stroke.  Neuro deficit, acute, stroke suspected EXAM: CT HEAD WITHOUT CONTRAST TECHNIQUE: Contiguous axial images were obtained from the base of the skull through the vertex without intravenous contrast. COMPARISON:  06/27/2020. FINDINGS: Brain: No intracranial hemorrhage. New focal hypodensity involving the left insula. No mass lesion. No midline shift, ventriculomegaly or extra-axial fluid collection. Chronic microvascular ischemic changes. Vascular: No hyperdense vessel or unexpected calcification. Minimal carotid siphon atherosclerotic calcifications. Skull: Negative for fracture or focal lesion. Sinuses/Orbits: No acute finding.  Mild pansinus mucosal thickening. Other: Right parietal scalp nodule is unchanged. ASPECTS [place] Stroke Program Early CT Score) - Ganglionic level infarction (caudate, lentiform nuclei, internal capsule, insula, M1-M3 cortex): 6 - Supraganglionic infarction (M4-M6 cortex): 3 Total score  (0-10 with 10 being normal): 9 IMPRESSION: 1. 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 2. Chronic microvascular ischemic changes. 3. ASPECTS is 9 Code stroke imaging results were communicated on 11/24/2020 at 4:23 pm to provider Dr. Rory Percy via secure text paging. Electronically Signed   By: Primitivo Gauze M.D.   On: 11/24/2020 16:24   CT ANGIO HEAD CODE STROKE  Result Date: 11/24/2020 CLINICAL DATA:  Neuro deficit, acute, stroke suspected EXAM: CT ANGIOGRAPHY HEAD AND NECK TECHNIQUE: Multidetector CT imaging of the head and neck was performed using the standard protocol during bolus administration of intravenous contrast. Multiplanar CT image reconstructions and MIPs were obtained to evaluate the vascular anatomy. Carotid stenosis measurements (when applicable) are obtained utilizing NASCET criteria, using the distal internal carotid diameter as the denominator. CONTRAST:  28mL OMNIPAQUE IOHEXOL 350 MG/ML SOLN COMPARISON:  None. FINDINGS: CTA NECK FINDINGS Aortic arch: Standard branching. Mild aortic arch atherosclerotic calcifications. Mild proximal left subclavian artery narrowing secondary to atheromatous plaque. Otherwise patent great vessel origins. Right carotid system: Patent. Minimal proximal ICA atherosclerotic calcifications without significant narrowing. Left carotid system: Patent. 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 Vertebral arteries: Mild left vertebral artery origin narrowing secondary to atheromatous disease. Otherwise patent and codominant. Skeleton: No acute finding.  Mild spondylosis. Other neck: No adenopathy.  No soft tissue mass. Upper chest: Patchy and nodular bilateral upper lung opacities concerning for infectious/inflammatory foci. Review of the MIP images confirms the above findings CTA HEAD FINDINGS Anterior circulation: Minimal carotid siphon atherosclerotic calcifications. Patent ICAs. Patent ophthalmic artery origins. Right A1 segment  hypoplasia. Patent ACAs. Patent right MCA. Left M2 segment occlusion. Posterior circulation: Patent V4 segments. Patent right PICA. Patent basilar and superior cerebellar arteries. Patent bilateral PCAs. Venous sinuses: As permitted by contrast timing, patent. Anatomic variants: Please see above. Review of the MIP images confirms the above findings IMPRESSION: CTA neck: 30-40% distal left CCA narrowing secondary to atheromatous plaque. Mild narrowing  of the proximal left subclavian and left vertebral artery origin. Bilateral upper lung nodular/patchy opacities concerning for infectious/inflammatory foci. CTA head: Left M2 branch occlusion. No aneurysm or dissection. Code stroke imaging results were communicated on 11/24/2020 at 4:41 pm to provider Dr. Rory Percy via secure text paging. Electronically Signed   By: Primitivo Gauze M.D.   On: 11/24/2020 16:41   CT ANGIO NECK CODE STROKE  Result Date: 11/24/2020 CLINICAL DATA:  Neuro deficit, acute, stroke suspected EXAM: CT ANGIOGRAPHY HEAD AND NECK TECHNIQUE: Multidetector CT imaging of the head and neck was performed using the standard protocol during bolus administration of intravenous contrast. Multiplanar CT image reconstructions and MIPs were obtained to evaluate the vascular anatomy. Carotid stenosis measurements (when applicable) are obtained utilizing NASCET criteria, using the distal internal carotid diameter as the denominator. CONTRAST:  9mL OMNIPAQUE IOHEXOL 350 MG/ML SOLN COMPARISON:  None. FINDINGS: CTA NECK FINDINGS Aortic arch: Standard branching. Mild aortic arch atherosclerotic calcifications. Mild proximal left subclavian artery narrowing secondary to atheromatous plaque. Otherwise patent great vessel origins. Right carotid system: Patent. Minimal proximal ICA atherosclerotic calcifications without significant narrowing. Left carotid system: Patent. 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 Vertebral arteries: Mild left  vertebral artery origin narrowing secondary to atheromatous disease. Otherwise patent and codominant. Skeleton: No acute finding.  Mild spondylosis. Other neck: No adenopathy.  No soft tissue mass. Upper chest: Patchy and nodular bilateral upper lung opacities concerning for infectious/inflammatory foci. Review of the MIP images confirms the above findings CTA HEAD FINDINGS Anterior circulation: Minimal carotid siphon atherosclerotic calcifications. Patent ICAs. Patent ophthalmic artery origins. Right A1 segment hypoplasia. Patent ACAs. Patent right MCA. Left M2 segment occlusion. Posterior circulation: Patent V4 segments. Patent right PICA. Patent basilar and superior cerebellar arteries. Patent bilateral PCAs. Venous sinuses: As permitted by contrast timing, patent. Anatomic variants: Please see above. Review of the MIP images confirms the above findings IMPRESSION: CTA neck: 30-40% distal left CCA narrowing secondary to atheromatous plaque. Mild narrowing of the proximal left subclavian and left vertebral artery origin. Bilateral upper lung nodular/patchy opacities concerning for infectious/inflammatory foci. CTA head: Left M2 branch occlusion. No aneurysm or dissection. Code stroke imaging results were communicated on 11/24/2020 at 4:41 pm to provider Dr. Rory Percy via secure text paging. Electronically Signed   By: Primitivo Gauze M.D.   On: 11/24/2020 16:41

## 2020-11-24 NOTE — ED Triage Notes (Signed)
Pt BIB [facility], LKW 1030 today. Pt developed right facial droop, right arm and leg weakness, and expressive aphasia. Code Stroke activated pta.

## 2020-11-24 NOTE — Transfer of Care (Signed)
Immediate Anesthesia Transfer of Care Note  Patient: Emily Lozano  Procedure(s) Performed: IR WITH ANESTHESIA (N/A )  Patient Location: NICU  Anesthesia Type:General  Level of Consciousness: Patient remains intubated per anesthesia plan  Airway & Oxygen Therapy: Patient placed on Ventilator (see vital sign flow sheet for setting)  Post-op Assessment: Report given to RN and Post -op Vital signs reviewed and stable  Post vital signs: Reviewed and stable  Last Vitals:  Vitals Value Taken Time  BP 116/98 11/24/20 1930  Temp    Pulse 70 11/24/20 1937  Resp 14 11/24/20 1937  SpO2 100 % 11/24/20 1937  Vitals shown include unvalidated device data.  Last Pain: There were no vitals filed for this visit.       Complications: No complications documented.

## 2020-11-24 NOTE — Progress Notes (Addendum)
Tested COVID +ve Likely will remain intubated post procedure. D/W NIR MD D/W Sons in ER waiting area. Repeat CTH at 1230 am  SBP goal 120-140.  Spoke with Red Christians, NP from PCCM to request consultation.   -- Amie Portland, MD Neurologist [facility] Pager: [phone]

## 2020-11-24 NOTE — Anesthesia Preprocedure Evaluation (Addendum)
Anesthesia Evaluation  Patient identified by MRN, date of birth, ID band Patient awake    Reviewed: Allergy & Precautions, Patient's Chart, lab work & pertinent test resultsPreop documentation limited or incomplete due to emergent nature of procedure.  Airway Mallampati: IV  TM Distance: >3 FB Neck ROM: Full    Dental no notable dental hx.    Pulmonary former smoker,    Pulmonary exam normal breath sounds clear to auscultation       Cardiovascular hypertension, Pt. on medications and Pt. on home beta blockers Normal cardiovascular exam Rhythm:Regular Rate:Normal     Neuro/Psych PSYCHIATRIC DISORDERS Depression CVA, Residual Symptoms    GI/Hepatic negative GI ROS, Neg liver ROS,   Endo/Other  diabetes, Oral Hypoglycemic Agents  Renal/GU negative Renal ROS     Musculoskeletal  (+) Arthritis ,   Abdominal   Peds  Hematology  (+) anemia ,   Anesthesia Other Findings CODE STROKE  Reproductive/Obstetrics                            Anesthesia Physical Anesthesia Plan  ASA: III and emergent  Anesthesia Plan: General   Post-op Pain Management:    Induction: Intravenous  PONV Risk Score and Plan: 3 and Ondansetron, Dexamethasone and Treatment may vary due to age or medical condition  Airway Management Planned: Oral ETT  Additional Equipment: Arterial line  Intra-op Plan:   Post-operative Plan: Possible Post-op intubation/ventilation  Informed Consent:     Only emergency history available  Plan Discussed with: CRNA  Anesthesia Plan Comments:        Anesthesia Quick Evaluation

## 2020-11-24 NOTE — Procedures (Signed)
INTERVENTIONAL NEURORADIOLOGY BRIEF POSTPROCEDURE NOTE  Diagnostic cerebral angiogram and mechanical thrombectomy  Attending: Dr. Pedro Earls  Assistant: None  Diagnosis: Left distal M2/MCA middle division branch occlusion  Access site: 18F RCFA  Access closure: Perclose proglide  Anesthesia: General anesthesia  Medication used: refer to anesthesia documentation.  Complications: Small right sylvian contrast extravasation.  Estimated blood loss: 50 mL  Specimen: None.  Findings: There was a left M2/MCA middle division branch occlusion with likely atherosclerotic plaque underlying. Two direct contact aspiration passes performed with recanalization of the proximal aspect of the M2 branch and persistent distal occlusion. Two passes were then performed with stent retriever with persistent occlusion. Flat panel head CT showed contrast extravasation in the left sylvian fissure. Delayed angiograms and delayed flat panel CT showed no evidence of active extravasation.  The patient remained stable throughout the intervention, but remained intubated given contrast extravasation and positive covid-19 test.   Left MCA status pre intervention: TICI2B Left MCA status post intervention: TICI2B

## 2020-11-24 NOTE — ED Provider Notes (Signed)
[facility] EMERGENCY DEPARTMENT Provider Note   CSN: [id number] Arrival date & time: 11/24/20  1556     History No chief complaint on file.   Emily Lozano is a 70 y.o. female. She is brought in as a code stroke activation. Last known well 10:30 AM. Acute onset of right-sided weakness. Had seen neurology earlier this month for some gait difficulties. Saw PCP yesterday and had negative Covid testing was treated with Zithromax and steroids for bronchitis.  The history is provided by the EMS personnel.  Cerebrovascular Accident This is a new problem. The current episode started 6 to 12 hours ago. The problem occurs constantly. The problem has not changed since onset.Nothing aggravates the symptoms. Nothing relieves the symptoms. She has tried nothing for the symptoms. The treatment provided no relief.       Past Medical History:  Diagnosis Date  . Anemia   . Arthritis   . 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   . Depression   . Diabetes mellitus without complication ([place])   . Hypertension   . Melanoma ([place]) 2014   resected from Left forearm.     There are no problems to display for this patient.   Past Surgical History:  Procedure Laterality Date  . ABDOMINAL HYSTERECTOMY    . BREAST LUMPECTOMY    . CARPAL TUNNEL RELEASE    . ESOPHAGEAL MANOMETRY N/A 12/05/2015   Procedure: ESOPHAGEAL MANOMETRY (EM);  Surgeon: Josefine Class, MD;  Location: [facility] ENDOSCOPY;  Service: Endoscopy;  Laterality: N/A;  . ESOPHAGOGASTRODUODENOSCOPY (EGD) WITH PROPOFOL N/A 11/26/2015   Procedure: ESOPHAGOGASTRODUODENOSCOPY (EGD) WITH PROPOFOL;  Surgeon: Lollie Sails, MD;  Location: [facility] ENDOSCOPY;  Service: Endoscopy;  Laterality: N/A;  . ESOPHAGOGASTRODUODENOSCOPY (EGD) WITH PROPOFOL N/A 04/30/2017   Procedure: ESOPHAGOGASTRODUODENOSCOPY (EGD) WITH PROPOFOL;  Surgeon: Lollie Sails, MD;  Location: [facility] ENDOSCOPY;  Service: Endoscopy;  Laterality:  N/A;  . EXCISION MELANOMA WITH SENTINEL LYMPH NODE BIOPSY       OB History   No obstetric history on file.     No family history on file.  Social History   Tobacco Use  . Smoking status: Former Research scientist (life sciences)  . Smokeless tobacco: Never Used  Substance Use Topics  . Alcohol use: No  . Drug use: No    Home Medications Prior to Admission medications   Medication Sig Start Date End Date Taking? Authorizing Provider  aspirin EC 81 MG tablet Take 81 mg by mouth daily.    [provider]  buPROPion (WELLBUTRIN XL) 300 MG 24 hr tablet Take 300 mg by mouth daily.    [provider]  cetirizine (ZYRTEC ALLERGY) 10 MG tablet Take 1 tablet (10 mg total) by mouth daily. 05/25/18   Flora Lipps, MD  escitalopram (LEXAPRO) 10 MG tablet Take 10 mg by mouth daily.    [provider]  fluticasone-salmeterol (ADVAIR HFA) 230-21 MCG/ACT inhaler Inhale 2 puffs into the lungs 2 (two) times daily. 05/25/18   Flora Lipps, MD  folic acid (FOLVITE) 1 MG tablet Take 1 mg by mouth daily.    [provider]  glimepiride (AMARYL) 2 MG tablet Take 1 tablet by mouth daily. 11/04/17   [provider]  hydroxychloroquine (PLAQUENIL) 200 MG tablet Take 400 mg by mouth daily.    [provider]  losartan-hydrochlorothiazide (HYZAAR) 100-12.5 MG tablet Take 1 tablet by mouth daily.    [provider]  metFORMIN (GLUCOPHAGE) 500 MG tablet Take 1,000  mg by mouth 2 (two) times daily with a meal.    [provider]  methotrexate (RHEUMATREX) 2.5 MG tablet Take 2.5 mg by mouth once a week. Caution:Chemotherapy. Protect from light.    [provider]  metoprolol succinate (TOPROL-XL) 100 MG 24 hr tablet Take 100 mg by mouth daily. Take with or immediately following a meal.    [provider]  pantoprazole (PROTONIX) 40 MG tablet Take 40 mg by mouth 2 (two) times daily.    [provider]  rosuvastatin (CRESTOR) 10 MG tablet Take 10 mg  by mouth daily.    [provider]    Allergies    Morphine and related  Review of Systems   Review of Systems  Unable to perform ROS: Acuity of condition    Physical Exam Updated Vital Signs There were no vitals taken for this visit.  Physical Exam Vitals and nursing note reviewed.  Constitutional:      General: She is not in acute distress.    Appearance: She is well-developed and well-nourished.  HENT:     Head: Normocephalic and atraumatic.  Eyes:     Conjunctiva/sclera: Conjunctivae normal.  Cardiovascular:     Rate and Rhythm: Normal rate and regular rhythm.     Heart sounds: No murmur heard.   Pulmonary:     Effort: Pulmonary effort is normal. No respiratory distress.     Breath sounds: Normal breath sounds.  Abdominal:     Palpations: Abdomen is soft.     Tenderness: There is no abdominal tenderness.  Musculoskeletal:        General: No deformity or edema.     Cervical back: Neck supple.  Skin:    General: Skin is warm and dry.  Neurological:     Mental Status: She is alert.     Comments: Patient has right face weakness and some aphasia.  Also weakness right arm and right leg.  Somewhat following commands.  Psychiatric:        Mood and Affect: Mood and affect normal.     ED Results / Procedures / Treatments   Labs (all labs ordered are listed, but only abnormal results are displayed) Labs Reviewed  RESP PANEL BY RT-PCR (FLU A&B, COVID) ARPGX2 - Abnormal; Notable for the following components:      Result Value   SARS Coronavirus 2 by RT PCR POSITIVE (*)    All other components within normal limits  CBC - Abnormal; Notable for the following components:   Hemoglobin 11.7 (*)    MCH 25.2 (*)    Platelets 403 (*)    All other components within normal limits  COMPREHENSIVE METABOLIC PANEL - Abnormal; Notable for the following components:   CO2 19 (*)    Glucose, Bld 285 (*)    Calcium 8.6 (*)    Albumin 2.9 (*)    All other components  within normal limits  HEMOGLOBIN A1C - Abnormal; Notable for the following components:   Hgb A1c MFr Bld 10.6 (*)    All other components within normal limits  LIPID PANEL - Abnormal; Notable for the following components:   Triglycerides 418 (*)    HDL 15 (*)    All other components within normal limits  GLUCOSE, CAPILLARY - Abnormal; Notable for the following components:   Glucose-Capillary 164 (*)    All other components within normal limits  D-DIMER, QUANTITATIVE - Abnormal; Notable for the following components:   D-Dimer, Quant 2.89 (*)  All other components within normal limits  CBC - Abnormal; Notable for the following components:   Hemoglobin 11.9 (*)    MCH 24.1 (*)    MCHC 29.5 (*)    Platelets 136 (*)    All other components within normal limits  COMPREHENSIVE METABOLIC PANEL - Abnormal; Notable for the following components:   Potassium 2.8 (*)    Glucose, Bld 202 (*)    Calcium 8.1 (*)    Total Protein 6.3 (*)    Albumin 2.5 (*)    Total Bilirubin <0.1 (*)    All other components within normal limits  LACTIC ACID, PLASMA - Abnormal; Notable for the following components:   Lactic Acid, Venous 2.4 (*)    All other components within normal limits  HEMOGLOBIN A1C - Abnormal; Notable for the following components:   Hgb A1c MFr Bld 10.4 (*)    All other components within normal limits  LACTIC ACID, PLASMA - Abnormal; Notable for the following components:   Lactic Acid, Venous 2.5 (*)    All other components within normal limits  GLUCOSE, CAPILLARY - Abnormal; Notable for the following components:   Glucose-Capillary 168 (*)    All other components within normal limits  GLUCOSE, CAPILLARY - Abnormal; Notable for the following components:   Glucose-Capillary 171 (*)    All other components within normal limits  GLUCOSE, CAPILLARY - Abnormal; Notable for the following components:   Glucose-Capillary 142 (*)    All other components within normal limits  I-STAT CHEM 8,  ED - Abnormal; Notable for the following components:   Glucose, Bld 288 (*)    Calcium, Ion 0.95 (*)    Hemoglobin 11.9 (*)    HCT 35.0 (*)    All other components within normal limits  POCT I-STAT 7, (LYTES, BLD GAS, ICA,H+H) - Abnormal; Notable for the following components:   Potassium 2.9 (*)    HCT 32.0 (*)    Hemoglobin 10.9 (*)    All other components within normal limits  MRSA PCR SCREENING  PROTIME-INR  DIFFERENTIAL  MAGNESIUM  PHOSPHORUS  PROCALCITONIN  PROCALCITONIN  STREP PNEUMONIAE URINARY ANTIGEN  HIV ANTIBODY (ROUTINE TESTING W REFLEX)  LDL CHOLESTEROL, DIRECT  BLOOD GAS, ARTERIAL  QUANTIFERON-TB GOLD PLUS  LEGIONELLA PNEUMOPHILA SEROGP 1 UR AG  SAR COV2 SEROLOGY (COVID19)AB(IGG),IA  CBG MONITORING, ED  TROPONIN I (HIGH SENSITIVITY)  TROPONIN I (HIGH SENSITIVITY)    EKG None  Radiology MR BRAIN WO CONTRAST  Result Date: 11/25/2020 CLINICAL DATA:  Acute stroke presentation 11/24/2020. Left insular infarction. EXAM: MRI HEAD WITHOUT CONTRAST TECHNIQUE: Multiplanar, multiecho pulse sequences of the brain and surrounding structures were obtained without intravenous contrast. COMPARISON:  CT and interventional studies over the last day. FINDINGS: Brain: Diffusion imaging shows acute infarction in the deep insula and left frontoparietal cortical and subcortical brain. Mild swelling in the region. There are either minimal petechial blood products or there is susceptibility artifact related to thrombosed vessels. No frank hematoma. No mass effect or shift. Elsewhere, the brainstem and cerebellum are unremarkable. Both cerebral hemispheres show moderate to severe chronic small-vessel ischemic changes throughout the white matter. No hydrocephalus. No extra-axial collection. Vascular: Major vessels at base of the brain show flow. Skull and upper cervical spine: Negative Sinuses/Orbits: Mucosal inflammatory changes of paranasal sinuses. Orbits negative. Other: None IMPRESSION:  1. Acute infarction in the left MCA territory affecting the deep insula and left frontoparietal cortical and subcortical brain. Minimal petechial blood products or susceptibility artifact related to thrombosed  vessels. No frank hematoma. No mass effect or shift. 2. Moderate to severe chronic small-vessel ischemic changes elsewhere throughout the cerebral hemispheric white matter. Electronically Signed   By: Nelson Chimes M.D.   On: 11/25/2020 02:18   CT CEREBRAL PERFUSION W CONTRAST  Result Date: 11/24/2020 CLINICAL DATA:  Neuro deficit, acute, stroke suspected EXAM: CT PERFUSION BRAIN TECHNIQUE: Multiphase CT imaging of the brain was performed following IV bolus contrast injection. Subsequent parametric perfusion maps were calculated using RAPID software. CONTRAST:  38m OMNIPAQUE IOHEXOL 350 MG/ML SOLN COMPARISON:  Concurrent CTA head and neck. FINDINGS: CT Brain Perfusion Findings: CBF (<30%) Volume: 013mPerfusion (Tmax>6.0s) volume: 3038mismatch Volume: 53m43mPECTS on noncontrast CT Head: 9 at 16:42 today. Infarct Core: 0 mL Infarction Location:Not applicable. IMPRESSION: Perfusion imaging demonstrates no infarct core. 30 mL region of ischemia involving the left MCA territory. Electronically Signed   By: ChikPrimitivo Gauze.   On: 11/24/2020 16:55   DG Chest Port 1 View  Result Date: 11/24/2020 CLINICAL DATA:  COVID positive EXAM: PORTABLE CHEST 1 VIEW COMPARISON:  None. FINDINGS: The heart size and mediastinal contours are mildly enlarged. ETT is 4 cm above the carina. NG tube is seen below the diaphragm. Aortic knob calcifications are seen. There is hazy patchy airspace opacity seen at the periphery of the right lower lung and at the left lung base. No pleural effusion. IMPRESSION: ETT and NG tube in satisfactory position Bilateral multifocal airspace opacities, consistent with multifocal pneumonia Electronically Signed   By: BindPrudencio Pair.   On: 11/24/2020 22:14   CT HEAD CODE STROKE WO  CONTRAST  Result Date: 11/24/2020 CLINICAL DATA:  Code stroke.  Neuro deficit, acute, stroke suspected EXAM: CT HEAD WITHOUT CONTRAST TECHNIQUE: Contiguous axial images were obtained from the base of the skull through the vertex without intravenous contrast. COMPARISON:  06/27/2020. FINDINGS: Brain: No intracranial hemorrhage. New focal hypodensity involving the left insula. No mass lesion. No midline shift, ventriculomegaly or extra-axial fluid collection. Chronic microvascular ischemic changes. Vascular: No hyperdense vessel or unexpected calcification. Minimal carotid siphon atherosclerotic calcifications. Skull: Negative for fracture or focal lesion. Sinuses/Orbits: No acute finding.  Mild pansinus mucosal thickening. Other: Right parietal scalp nodule is unchanged. ASPECTS ([facility] Early CT Score) - Ganglionic level infarction (caudate, lentiform nuclei, internal capsule, insula, M1-M3 cortex): 6 - Supraganglionic infarction (M4-M6 cortex): 3 Total score (0-10 with 10 being normal): 9 IMPRESSION: 1. 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 2. Chronic microvascular ischemic changes. 3. ASPECTS is 9 Code stroke imaging results were communicated on 11/24/2020 at 4:23 pm to provider Dr. ArorRory Percy secure text paging. Electronically Signed   By: ChikPrimitivo Gauze.   On: 11/24/2020 16:24   CT ANGIO HEAD CODE STROKE  Result Date: 11/24/2020 CLINICAL DATA:  Neuro deficit, acute, stroke suspected EXAM: CT ANGIOGRAPHY HEAD AND NECK TECHNIQUE: Multidetector CT imaging of the head and neck was performed using the standard protocol during bolus administration of intravenous contrast. Multiplanar CT image reconstructions and MIPs were obtained to evaluate the vascular anatomy. Carotid stenosis measurements (when applicable) are obtained utilizing NASCET criteria, using the distal internal carotid diameter as the denominator. CONTRAST:  60mL70mIPAQUE IOHEXOL 350 MG/ML SOLN COMPARISON:  None.  FINDINGS: CTA NECK FINDINGS Aortic arch: Standard branching. Mild aortic arch atherosclerotic calcifications. Mild proximal left subclavian artery narrowing secondary to atheromatous plaque. Otherwise patent great vessel origins. Right carotid system: Patent. Minimal proximal ICA atherosclerotic calcifications without significant narrowing. Left carotid system: Patent.  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 Vertebral arteries: Mild left vertebral artery origin narrowing secondary to atheromatous disease. Otherwise patent and codominant. Skeleton: No acute finding.  Mild spondylosis. Other neck: No adenopathy.  No soft tissue mass. Upper chest: Patchy and nodular bilateral upper lung opacities concerning for infectious/inflammatory foci. Review of the MIP images confirms the above findings CTA HEAD FINDINGS Anterior circulation: Minimal carotid siphon atherosclerotic calcifications. Patent ICAs. Patent ophthalmic artery origins. Right A1 segment hypoplasia. Patent ACAs. Patent right MCA. Left M2 segment occlusion. Posterior circulation: Patent V4 segments. Patent right PICA. Patent basilar and superior cerebellar arteries. Patent bilateral PCAs. Venous sinuses: As permitted by contrast timing, patent. Anatomic variants: Please see above. Review of the MIP images confirms the above findings IMPRESSION: CTA neck: 30-40% distal left CCA narrowing secondary to atheromatous plaque. Mild narrowing of the proximal left subclavian and left vertebral artery origin. Bilateral upper lung nodular/patchy opacities concerning for infectious/inflammatory foci. CTA head: Left M2 branch occlusion. No aneurysm or dissection. Code stroke imaging results were communicated on 11/24/2020 at 4:41 pm to provider Dr. Rory Percy via secure text paging. Electronically Signed   By: Primitivo Gauze M.D.   On: 11/24/2020 16:41   CT ANGIO NECK CODE STROKE  Result Date: 11/24/2020 CLINICAL DATA:  Neuro deficit, acute,  stroke suspected EXAM: CT ANGIOGRAPHY HEAD AND NECK TECHNIQUE: Multidetector CT imaging of the head and neck was performed using the standard protocol during bolus administration of intravenous contrast. Multiplanar CT image reconstructions and MIPs were obtained to evaluate the vascular anatomy. Carotid stenosis measurements (when applicable) are obtained utilizing NASCET criteria, using the distal internal carotid diameter as the denominator. CONTRAST:  35m OMNIPAQUE IOHEXOL 350 MG/ML SOLN COMPARISON:  None. FINDINGS: CTA NECK FINDINGS Aortic arch: Standard branching. Mild aortic arch atherosclerotic calcifications. Mild proximal left subclavian artery narrowing secondary to atheromatous plaque. Otherwise patent great vessel origins. Right carotid system: Patent. Minimal proximal ICA atherosclerotic calcifications without significant narrowing. Left carotid system: Patent. 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 Vertebral arteries: Mild left vertebral artery origin narrowing secondary to atheromatous disease. Otherwise patent and codominant. Skeleton: No acute finding.  Mild spondylosis. Other neck: No adenopathy.  No soft tissue mass. Upper chest: Patchy and nodular bilateral upper lung opacities concerning for infectious/inflammatory foci. Review of the MIP images confirms the above findings CTA HEAD FINDINGS Anterior circulation: Minimal carotid siphon atherosclerotic calcifications. Patent ICAs. Patent ophthalmic artery origins. Right A1 segment hypoplasia. Patent ACAs. Patent right MCA. Left M2 segment occlusion. Posterior circulation: Patent V4 segments. Patent right PICA. Patent basilar and superior cerebellar arteries. Patent bilateral PCAs. Venous sinuses: As permitted by contrast timing, patent. Anatomic variants: Please see above. Review of the MIP images confirms the above findings IMPRESSION: CTA neck: 30-40% distal left CCA narrowing secondary to atheromatous plaque. Mild  narrowing of the proximal left subclavian and left vertebral artery origin. Bilateral upper lung nodular/patchy opacities concerning for infectious/inflammatory foci. CTA head: Left M2 branch occlusion. No aneurysm or dissection. Code stroke imaging results were communicated on 11/24/2020 at 4:41 pm to provider Dr. ARory Percyvia secure text paging. Electronically Signed   By: CPrimitivo GauzeM.D.   On: 11/24/2020 16:41    Procedures .Critical Care Performed by: BHayden Rasmussen MD Authorized by: BHayden Rasmussen MD   Critical care provider statement:    Critical care time (minutes):  45   Critical care time was exclusive of:  Separately billable procedures and treating other patients  Critical care was necessary to treat or prevent imminent or life-threatening deterioration of the following conditions:  CNS failure or compromise   Critical care was time spent personally by me on the following activities:  Discussions with consultants, evaluation of patient's response to treatment, examination of patient, ordering and performing treatments and interventions, ordering and review of laboratory studies, ordering and review of radiographic studies, pulse oximetry, re-evaluation of patient's condition, obtaining history from patient or surrogate, review of old charts and development of treatment plan with patient or surrogate     Medications Ordered in ED Medications  sodium chloride flush (NS) 0.9 % injection 3 mL (has no administration in time range)  0.9 %  sodium chloride infusion ( Intravenous IV Pump Association 11/25/20 1020)  acetaminophen (TYLENOL) tablet 650 mg ( Oral See Alternative 11/25/20 0505)    Or  acetaminophen (TYLENOL) 160 MG/5ML solution 650 mg (650 mg Per Tube Given 11/25/20 0505)    Or  acetaminophen (TYLENOL) suppository 650 mg ( Rectal See Alternative 11/25/20 0505)  iohexol (OMNIPAQUE) 240 MG/ML injection (has no administration in time range)  ticagrelor (BRILINTA) 90 MG  tablet (has no administration in time range)  insulin regular, human (MYXREDLIN) 100 units/ 100 mL infusion (has no administration in time range)  dextrose 50 % solution 0-50 mL (has no administration in time range)  clevidipine (CLEVIPREX) infusion 0.5 mg/mL (4 mg/hr Intravenous New Bag/Given 11/25/20 0941)  propofol (DIPRIVAN) 1000 MG/100ML infusion (40 mcg/kg/min  80 kg Intravenous Rate/Dose Change 11/25/20 0800)  insulin aspart (novoLOG) injection 0-20 Units (3 Units Subcutaneous Given 11/25/20 0908)  mometasone-formoterol (DULERA) 100-5 MCG/ACT inhaler 2 puff (2 puffs Inhalation Not Given 11/25/20 0745)  remdesivir 200 mg in sodium chloride 0.9% 250 mL IVPB (0 mg Intravenous Stopping Infusion hung by another clincian 11/25/20 0932)    Followed by  remdesivir 100 mg in sodium chloride 0.9 % 100 mL IVPB (has no administration in time range)  pantoprazole (PROTONIX) injection 40 mg (40 mg Intravenous Given 11/24/20 2227)  chlorhexidine gluconate (MEDLINE KIT) (PERIDEX) 0.12 % solution 15 mL (15 mLs Mouth Rinse Given 11/25/20 0838)  MEDLINE mouth rinse (15 mLs Mouth Rinse Given 11/25/20 0527)  senna-docusate (Senokot-S) tablet 1 tablet (has no administration in time range)  heparin injection 5,000 Units (5,000 Units Subcutaneous Given 11/25/20 0522)  Chlorhexidine Gluconate Cloth 2 % PADS 6 each (has no administration in time range)  aspirin chewable tablet 324 mg (has no administration in time range)  potassium chloride (KLOR-CON) packet 40 mEq (has no administration in time range)  potassium chloride 10 mEq in 100 mL IVPB (has no administration in time range)  magnesium sulfate IVPB 2 g 50 mL (has no administration in time range)  iohexol (OMNIPAQUE) 350 MG/ML injection 60 mL (60 mLs Intravenous Contrast Given 11/24/20 1622)  iohexol (OMNIPAQUE) 350 MG/ML injection 40 mL (40 mLs Intravenous Contrast Given 11/24/20 1632)   stroke: mapping our early stages of recovery book ( Does not apply Given 11/24/20  2134)  iohexol (OMNIPAQUE) 240 MG/ML injection 150 mL (75 mLs Intra-arterial Contrast Given 11/24/20 1832)  propofol (DIPRIVAN) 1000 MG/100ML infusion (  New Bag/Given 11/25/20 0330)    ED Course  I have reviewed the triage vital signs and the nursing notes.  Pertinent labs & imaging results that were available during my care of the patient were reviewed by me and considered in my medical decision making (see chart for details).  Clinical Course as of 11/25/20 1030  Sat Nov 24, 2020  1614 Patient was emergently met by neurology on arrival and taken to CT. [MB]  1638 Possible IR candidate per neurology Dr. Rory Percy [MB]    Clinical Course User Index [MB] Hayden Rasmussen, MD   MDM Rules/Calculators/A&P                         This patient complains of acute onset of right-sided weakness; this involves an extensive number of treatment Options and is a complaint that carries with it a high risk of complications and Morbidity. The differential includes stroke, bleed, heart glycemia, metabolic derangement, arrhythmia  I ordered, reviewed and interpreted labs, which included CBC with normal white count, hemoglobin low, chemistries with elevated glucose, Covid testing positive I ordered imaging studies which included CT head, CTA head and neck, CT perfusion and I independently    visualized and interpreted imaging which showed acute M2 occlusion Additional history obtained from EMS Previous records obtained and reviewed in epic, recent antibiotics and steroids for pulmonary complaints I consulted neuro hospitalist Dr.Arora And discussed lab and imaging findings  Critical Interventions: Patient with acute LVO needing emergent work-up and transfer to interventional radiology  After the interventions stated above, I reevaluated the patient and found patient still to be symptomatic and is on her way to the interventional radiology suite for further management   Final Clinical Impression(s) / ED  Diagnoses Final diagnoses:  Acute CVA (cerebrovascular accident) [facility])    Rx / Wisdom Orders ED Discharge Orders    None       Hayden Rasmussen, MD 11/25/20 [phone]

## 2020-11-24 NOTE — H&P (Addendum)
Neurology H&P  CC: Acute right hemiparesis, left gaze preference, and aphasia  History is obtained from: Chart review, patient son, EMS  HPI: Emily Lozano is a 70 y.o. female with a medical history significant for hypertension, hyperlipidemia, type 2 diabetes mellitus, depression, GERD, melanoma, sleep apnea, and recent exposure to COVID-19 with a negative test at her PCP yesterday but diagnosed with acute bronchitis and initiated on prednisone and a z-pack. She presents to the ED today from home due to acute onset of right hemiparesis, aphasia, and with a left gaze preference. Per her son, he was with her this morning and went to get something and when he returned around 10:30 she was slumped over with decreased responsiveness. EMS was subsequently activated after some time and she was brought to [facility] ED as a stroke alert.   At baseline, she is independent and able to care for herself. She was seen recently 11/05/20 by neurology outpatient for a single episode of festinating gait without observed gait disturbance. CT head was obtained at that time revealed mild chronic ischemic white matter disease without acute intracranial abnormality. She takes aspirin daily at home.  LKW: 10:30 AM tpa given?: No, outside of thrombectomy time window IR Thrombectomy? Yes, LVO indicated on presentation and initial imaging Modified Rankin Scale: 0-Completely asymptomatic and back to baseline post- stroke NIHSS:  1a Level of Conscious.: 0 1b LOC Questions: 2 1c LOC Commands: 0 2 Best Gaze: 1, prefers leftward gaze 3 Visual: 2 4 Facial Palsy: 2 5a Motor Arm - left: 0 5b Motor Arm - Right: 3 6a Motor Leg - Left: 0 6b Motor Leg - Right: 1 7 Limb Ataxia: 0 8 Sensory: 1 9 Best Language: 2 10 Dysarthria: 2 11 Extinct. and Inatten.: 1 TOTAL: 17  ROS: Unable to obtain due to altered mental status.   Past Medical History:  Diagnosis Date  . Anemia   . Arthritis   . Collagen vascular disease (HCC)     Rhematoid Arthritis.   . Depression   . Diabetes mellitus without complication ([place])   . Hypertension   . Melanoma ([place]) 2014   resected from Left forearm.    Past Surgical History:  Procedure Laterality Date  . ABDOMINAL HYSTERECTOMY    . BREAST LUMPECTOMY    . CARPAL TUNNEL RELEASE    . ESOPHAGEAL MANOMETRY N/A 12/05/2015   Procedure: ESOPHAGEAL MANOMETRY (EM);  Surgeon: Josefine Class, MD;  Location: [facility] ENDOSCOPY;  Service: Endoscopy;  Laterality: N/A;  . ESOPHAGOGASTRODUODENOSCOPY (EGD) WITH PROPOFOL N/A 11/26/2015   Procedure: ESOPHAGOGASTRODUODENOSCOPY (EGD) WITH PROPOFOL;  Surgeon: Lollie Sails, MD;  Location: [facility] ENDOSCOPY;  Service: Endoscopy;  Laterality: N/A;  . ESOPHAGOGASTRODUODENOSCOPY (EGD) WITH PROPOFOL N/A 04/30/2017   Procedure: ESOPHAGOGASTRODUODENOSCOPY (EGD) WITH PROPOFOL;  Surgeon: Lollie Sails, MD;  Location: [facility] ENDOSCOPY;  Service: Endoscopy;  Laterality: N/A;  . EXCISION MELANOMA WITH SENTINEL LYMPH NODE BIOPSY     No family history noted.   Social History:  reports that she has quit smoking. She has never used smokeless tobacco. She reports that she does not drink alcohol and does not use drugs.  Prior to Admission medications   Medication Sig Start Date End Date Taking? Authorizing Provider  aspirin EC 81 MG tablet Take 81 mg by mouth daily.    [provider]  buPROPion (WELLBUTRIN XL) 300 MG 24 hr tablet Take 300 mg by mouth daily.    [provider]  cetirizine (ZYRTEC ALLERGY) 10 MG tablet Take 1  tablet (10 mg total) by mouth daily. 05/25/18   Flora Lipps, MD  escitalopram (LEXAPRO) 10 MG tablet Take 10 mg by mouth daily.    [provider]  fluticasone-salmeterol (ADVAIR HFA) 230-21 MCG/ACT inhaler Inhale 2 puffs into the lungs 2 (two) times daily. 05/25/18   Flora Lipps, MD  folic acid (FOLVITE) 1 MG tablet Take 1 mg by mouth daily.    [provider]  glimepiride (AMARYL) 2 MG tablet Take 1 tablet by  mouth daily. 11/04/17   [provider]  hydroxychloroquine (PLAQUENIL) 200 MG tablet Take 400 mg by mouth daily.    [provider]  losartan-hydrochlorothiazide (HYZAAR) 100-12.5 MG tablet Take 1 tablet by mouth daily.    [provider]  metFORMIN (GLUCOPHAGE) 500 MG tablet Take 1,000 mg by mouth 2 (two) times daily with a meal.    [provider]  methotrexate (RHEUMATREX) 2.5 MG tablet Take 2.5 mg by mouth once a week. Caution:Chemotherapy. Protect from light.    [provider]  metoprolol succinate (TOPROL-XL) 100 MG 24 hr tablet Take 100 mg by mouth daily. Take with or immediately following a meal.    [provider]  pantoprazole (PROTONIX) 40 MG tablet Take 40 mg by mouth 2 (two) times daily.    [provider]  rosuvastatin (CRESTOR) 10 MG tablet Take 10 mg by mouth daily.    [provider]   Exam: Current vital signs: BP (!) 172/80 (BP Location: Left Arm)   Pulse 78   Resp 14   SpO2 97%   Physical Exam  Constitutional: Appears well-developed and well-nourished.  Psych: Appears slightly anxious. Cooperative with examination  Eyes: Normal conjunctiva EENT: No OP obstruction, dry mucous membranes Head: Normocephalic, atraumatic no obvious abnormality Cardiovascular: Extremities warm without edema Respiratory: Effort normal, non-labored breathing GI: Soft.  No distension.  Skin: WDI  Neuro: Mental Status: Patient is awake but has expressive aphasia. She is able to state her name in a very dysarthric tone, otherwise speech is unintelligible. Patient is unable to give a clear or coherent history. She attempts to follow commands but inconsistently. There is some evidence of right-sided neglect on examination.  Cranial Nerves: II: Hemianopia present on the right . Pupils are equal, round, and reactive to light. III,IV, VI: Left gaze preference, able to cross midline with increased stimuli in right visual field.   V: Unable to assess facial sensation to light touch VII: Partial right facial palsy noted with right mouth and nasolabial fold involvement.  VIII: Hearing is intact to voice X: Phonation intact XI: Head appears midline XII: Patient does not protrude tongue to command.  Motor: Tone is normal. Bulk is normal. Left upper and lower extremity 5/5 strength without pronator drift. Right upper 1-2/5 and lower extremity 3/5 without ability to resist gravity.  Sensory: Sensation to light touch is decreased on the right upper and lower extremity.  Cerebellar: Unable to assess with significant weakness on the right upper and lower extremities.   I have reviewed labs in epic and the pertinent results are: CBC    Component Value Date/Time   WBC 5.5 11/24/2020 1602   RBC 4.65 11/24/2020 1602   HGB 11.9 (L) 11/24/2020 1604   HCT 35.0 (L) 11/24/2020 1604   PLT 403 (H) 11/24/2020 1602   MCV 80.6 11/24/2020 1602   MCH 25.2 (L) 11/24/2020 1602   MCHC 31.2 11/24/2020 1602   RDW 15.2 11/24/2020 1602   LYMPHSABS 0.8 11/24/2020 1602  MONOABS 0.5 11/24/2020 1602   EOSABS 0.0 11/24/2020 1602   BASOSABS 0.0 11/24/2020 1602   CMP     Component Value Date/Time   NA 141 11/24/2020 1604   K 3.8 11/24/2020 1604   CL 106 11/24/2020 1604   CO2 19 (L) 11/24/2020 1602   GLUCOSE 288 (H) 11/24/2020 1604   BUN 16 11/24/2020 1604   CREATININE 0.60 11/24/2020 1604   CALCIUM 8.6 (L) 11/24/2020 1602   PROT 6.9 11/24/2020 1602   ALBUMIN 2.9 (L) 11/24/2020 1602   AST 18 11/24/2020 1602   ALT 13 11/24/2020 1602   ALKPHOS 55 11/24/2020 1602   BILITOT 0.7 11/24/2020 1602   GFRNONAA >60 11/24/2020 1602   I have reviewed the images obtained: CT head: 1. 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 2. Chronic microvascular ischemic changes. 3. ASPECTS is 9  CTA neck: 30-40% distal left CCA narrowing secondary to atheromatous plaque. Mild narrowing of the proximal left subclavian and left vertebral  artery origin. Bilateral upper lung nodular/patchy opacities concerning for infectious/inflammatory foci.  CTA head: Left M2 branch occlusion.  CT Cerebral Perfusion: Perfusion imaging demonstrates no infarct core. 30 mL region of ischemia involving the left MCA territory.  No aneurysm or dissection. Primary Diagnosis:  Cerebral infarction, unspecified.  Secondary Diagnosis: Essential (primary) hypertension and Type 2 diabetes mellitus w/o complications  Impression: 70 year old female with history as above who presents with acute onset of right hemiparesis, left gaze preference, and aphasia. Activated as a code stroke with initial CT imaging revealing a left M2 branch occlusion. - IR was activated for thrombectomy as patient was outside of thrombolytic therapy window.   Recommendations: - HgbA1c, fasting lipid panel - MRI of the brain without contrast - Frequent neuro checks - Echocardiogram - Prophylactic therapy decision based on procedure results. - Risk factor modification - Telemetry monitoring - PT consult, OT consult, Speech consult - Stroke team to follow  Pt seen by NP/Neuro and later by MD. Note/plan to be edited by MD as needed.  Anibal Henderson, AGAC-NP [facility] Pager: [phone]   Attending addendum Patient seen and examined is an acute code stroke brought in by [facility]. Agree with the history and physical above Chief complaint: Right-sided weakness HPI: Briefly, 70 year old woman past history of hypertension, hyperlipidemia, diabetes, depression, questionable exposure to COVID-19 with a negative test at PCP yesterday but recent diagnosis of bronchitis, on steroids and antibiotics, brought in by EMS for concern for acute stroke due to right-sided weakness-last known well at 10:30 AM witnessed by the son.  They were together, son noticed her to be normal at 1030, shortly after went to get something to eat and when he returned to the room,  he saw her slipping down unable to bear weight on the right side.  When EMS saw her, she was weak on the right and was unable to answer their questions and sounded aphasic for which a LVO positive code stroke was activated in the field. On our examination here, she was awake alert, attempting to communicate, word salad and speech, was able to follow some simple commands and mimic some simple commands, moderate dysarthria, had equal pupils with left gaze preference, right hemianopsia, right lower facial weakness, right upper extremity 1-2/5, right lower extremity was barely a 3/5, left upper and lower extremity strength was normal, sensation was diminished on the right and she was neglecting the right side.  Difficult to test coordination due to her mentation. NIH stroke scale of 16  initially. Review of systems attempted but difficult to ascertain due to her aphasia.  History of bronchitis in the past few days. Past medical history as above Social history: Former smoker.  Diagnostics: Independently reviewed images CT head aspects 8-9 with left insular and slightly superior to insular hypodensity.  No bleed.  Extensive chronic white matter disease. CT angiogram head and neck with left M2 middle division occlusion..  30 to 40% distal left CCA narrowing secondary to atheromatous plaque.  Mild narrowing of the proximal left subclavian and left vertebral artery.  Bilateral upper lung nodular/patchy opacities concerning for infectious/inflammatory foci. CT perfusion with 0 core, 30 cc area at risk corresponding to that left M2 occlusion.  Labs: glucose 288, creatinine 0.75, WBC 5.5.  Hemoglobin 11.7. PT/INR: Specimen clotted  Assessment: 70 year old with multiple cerebrovascular risk factors presenting with sudden onset of left gaze preference, right facial droop, hemiparesis and expressive aphasia still with left hemispheric dysfunction.  CT angiogram head and neck with left M2 occlusion. Outside the  window for IV TPA-last known well 10:30 AM. Still a candidate for intervention-favorable perfusion profile, favorable aspects. Discussed with interventional neuroradiology who agreed to take the patient if the family consented. Obtain consent from the son-please see paper consent in the chart.  Risks and benefits explained and he agreed to proceed with the procedure.  Plan:  Acute Ischemic Stroke Cerebral infarction due to embolism of left middle cerebral artery   Acuity: Acute Current Suspected Etiology: Under investigation-atheroembolic versus cardioembolic Continue Evaluation:  -Admit to: Neurological ICU -Prophylactic treatment with aspirin and statin after the procedure-we will defer to the results of the procedure prior to making that decision. -Blood pressure control,-goal will depend on success of the revascularization.  If successfully revascularized, systolic goal will be between 120-140.  If the revascularization is unsuccessful, allow for permissive hypertension up to systolic 175 and treat only on a as needed basis if greater than 220. -MRI/ECHO/A1C/Lipid panel. -Hyperglycemia management per SSI to maintain glucose 140-180mg /dL. -PT/OT/ST therapies and recommendations when able  CNS -Close neuro monitoring  Dysarthria Dysphagia following cerebral infarction  -NPO until cleared by speech -ST  Hemiplegia and hemiparesis following cerebral infarction affecting right dominant side -PT/OT -PM&R consult  RESP Intubated for thrombectomy Attempt to extubate after the procedure If remains intubated, will consult PCCM for vent management  Check Covid test-was presumably treated for Covid bronchitis yesterday at primary care.  CV Hypertensive encephalopathy Essential (primary) hypertension Hypertensive emergency -Aggressive BP control, goal as discussed above -As needed labetalol and hydralazine.  Cleviprex drip if needed. -TTE  Hyperlipidemia, unspecified   - Statin for goal LDL < 70.  Hold until procedure outcome is known.  Avoid statin if there is a bleed.  HEME Iron Deficiency Anemia -Monitor -transfuse for hgb<7  ENDO Type 2 diabetes mellitus with hyperglycemia -bili worse due to prednisone that was started for presumed bronchitis related to Covid -SSI -goal HgbA1c < 7  GI/GU No acute issues Gentle hydration-normal saline 75 cc an hour   Fluid/Electrolyte Disorders Check BMP in the morning Replete lytes as necessary  ID Possible Aspiration PNA -CXR -NPO -Monitor   Prophylaxis DVT: SCD.  Decision on chemical prophylaxis after the procedure GI: PPI Bowel: Docusate/senna  Diet: NPO until cleared by speech  Code Status: Full Code   THE FOLLOWING WERE PRESENT ON ADMISSION: Acute ischemic stroke Probable aspiration pneumonia versus Diabetes Hypertensive emergency Essential hypertension Possible Hemiparesis, hemiplegia COVID 19 pneumonia  -- Amie Portland, MD Neurologist [facility] Pager: [phone]  CRITICAL CARE ATTESTATION Performed by: Amie Portland, MD Total critical care time: 60 minutes Critical care time was exclusive of separately billable procedures and treating other patients and/or supervising APPs/Residents/Students Critical care was necessary to treat or prevent imminent or life-threatening deterioration due to, hypertensive emergency, diabetes with hyperglycemia. This patient is critically ill and at significant risk for neurological worsening and/or death and care requires constant monitoring. Critical care was time spent personally by me on the following activities: development of treatment plan with patient and/or surrogate as well as nursing, discussions with consultants, evaluation of patient's response to treatment, examination of patient, obtaining history from patient or surrogate, ordering and performing treatments and interventions, ordering and review of  laboratory studies, ordering and review of radiographic studies, pulse oximetry, re-evaluation of patient's condition, participation in multidisciplinary rounds and medical decision making of high complexity in the care of this patient.

## 2020-11-24 NOTE — Care Plan (Addendum)
S/p unsuccessful revascularization, though there was some distal movement of the clot from M2 to M3.  Brief exam patient appears stable, pupils equal round reactive to light left gaze preference, alert, follows command to show me her thumb but then perseverates on this command, moving the left side spontaneously antigravity arm and leg  Given unsuccessful reperfusion, updating blood pressure goal  - Permissive hypertension to 220/120 due to LVO for 24 hours   Given concern for contrast extravasation versus bleed will hold aspirin and DVT prophylaxis for now but plan to start if brain imaging does not show concern for further hemorrhage  Lesleigh Noe MD-PhD [facility] [phone] Available 7 PM to 7 AM, outside of these hours please call Neurologist on call as listed on Amion.   MRI reviewed, no significant hemorrhage, DVT ppx started and Aspiring 325 mg started.

## 2020-11-24 NOTE — ED Notes (Signed)
Unable to obtain 18gIV for CT perfusion, ED resident attempted x 2.

## 2020-11-25 ENCOUNTER — Inpatient Hospital Stay (HOSPITAL_COMMUNITY): Payer: Medicare Other

## 2020-11-25 DIAGNOSIS — U071 COVID-19: Secondary | ICD-10-CM

## 2020-11-25 DIAGNOSIS — J9601 Acute respiratory failure with hypoxia: Secondary | ICD-10-CM

## 2020-11-25 DIAGNOSIS — I6389 Other cerebral infarction: Secondary | ICD-10-CM | POA: Diagnosis not present

## 2020-11-25 DIAGNOSIS — I161 Hypertensive emergency: Secondary | ICD-10-CM

## 2020-11-25 DIAGNOSIS — I639 Cerebral infarction, unspecified: Secondary | ICD-10-CM | POA: Diagnosis not present

## 2020-11-25 DIAGNOSIS — I82451 Acute embolism and thrombosis of right peroneal vein: Secondary | ICD-10-CM

## 2020-11-25 LAB — COMPREHENSIVE METABOLIC PANEL
ALT: 14 U/L (ref 0–44)
AST: 17 U/L (ref 15–41)
Albumin: 2.5 g/dL — ABNORMAL LOW (ref 3.5–5.0)
Alkaline Phosphatase: 55 U/L (ref 38–126)
Anion gap: 13 (ref 5–15)
BUN: 9 mg/dL (ref 8–23)
CO2: 23 mmol/L (ref 22–32)
Calcium: 8.1 mg/dL — ABNORMAL LOW (ref 8.9–10.3)
Chloride: 107 mmol/L (ref 98–111)
Creatinine, Ser: 0.89 mg/dL (ref 0.44–1.00)
GFR, Estimated: >60 mL/min (ref >60)
Glucose, Bld: 202 mg/dL — ABNORMAL HIGH (ref 70–99)
Potassium: 2.8 mmol/L — ABNORMAL LOW (ref 3.5–5.1)
Sodium: 143 mmol/L (ref 135–145)
Total Bilirubin: <0.1 mg/dL — ABNORMAL LOW (ref 0.3–1.2)
Total Protein: 6.3 g/dL — ABNORMAL LOW (ref 6.5–8.1)

## 2020-11-25 LAB — ECHOCARDIOGRAM LIMITED
Area-P 1/2: 3.85 cm2
Calc EF: 47.8 %
Height: 64 in
S' Lateral: 3.2 cm
Single Plane A2C EF: 47.3 %
Single Plane A4C EF: 48 %
Weight: 2821.89 oz

## 2020-11-25 LAB — LIPID PANEL
Cholesterol: 139 mg/dL (ref 0–200)
HDL: 15 mg/dL — ABNORMAL LOW (ref >40)
LDL Cholesterol: UNDETERMINED mg/dL (ref 0–99)
Total CHOL/HDL Ratio: 9.3 RATIO
Triglycerides: 418 mg/dL — ABNORMAL HIGH (ref <150)
VLDL: UNDETERMINED mg/dL (ref 0–40)

## 2020-11-25 LAB — GLUCOSE, CAPILLARY
Glucose-Capillary: 142 mg/dL — ABNORMAL HIGH (ref 70–99)
Glucose-Capillary: 168 mg/dL — ABNORMAL HIGH (ref 70–99)
Glucose-Capillary: 171 mg/dL — ABNORMAL HIGH (ref 70–99)
Glucose-Capillary: 212 mg/dL — ABNORMAL HIGH (ref 70–99)
Glucose-Capillary: 242 mg/dL — ABNORMAL HIGH (ref 70–99)
Glucose-Capillary: 260 mg/dL — ABNORMAL HIGH (ref 70–99)

## 2020-11-25 LAB — TROPONIN I (HIGH SENSITIVITY)
Troponin I (High Sensitivity): 6 ng/L (ref <18)
Troponin I (High Sensitivity): 5 ng/L (ref <18)
Troponin I (High Sensitivity): 6 ng/L (ref <18)

## 2020-11-25 LAB — PHOSPHORUS: Phosphorus: 2.7 mg/dL (ref 2.5–4.6)

## 2020-11-25 LAB — SAR COV2 SEROLOGY (COVID19)AB(IGG),IA: SARS-CoV-2 Ab, IgG: REACTIVE — AB

## 2020-11-25 LAB — HIV ANTIBODY (ROUTINE TESTING W REFLEX): HIV Screen 4th Generation wRfx: NONREACTIVE

## 2020-11-25 LAB — LDL CHOLESTEROL, DIRECT: Direct LDL: 76.9 mg/dL (ref 0–99)

## 2020-11-25 LAB — HEMOGLOBIN A1C
Hgb A1c MFr Bld: 10.6 % — ABNORMAL HIGH (ref 4.8–5.6)
Mean Plasma Glucose: 257.52 mg/dL

## 2020-11-25 LAB — MAGNESIUM: Magnesium: 1.9 mg/dL (ref 1.7–2.4)

## 2020-11-25 LAB — STREP PNEUMONIAE URINARY ANTIGEN: Strep Pneumo Urinary Antigen: NEGATIVE

## 2020-11-25 LAB — D-DIMER, QUANTITATIVE: D-Dimer, Quant: 2.89 ug/mL-FEU — ABNORMAL HIGH (ref 0.00–0.50)

## 2020-11-25 LAB — LACTIC ACID, PLASMA: Lactic Acid, Venous: 2.5 mmol/L (ref 0.5–1.9)

## 2020-11-25 LAB — PROCALCITONIN: Procalcitonin: <0.1 ng/mL

## 2020-11-25 LAB — HEPARIN LEVEL (UNFRACTIONATED): Heparin Unfractionated: <0.1 IU/mL — ABNORMAL LOW (ref 0.30–0.70)

## 2020-11-25 MED ORDER — HEPARIN (PORCINE) 25000 UT/250ML-% IV SOLN
1400.0000 [IU]/h | INTRAVENOUS | Status: DC
  Administered 2020-11-25: 1000 [IU]/h via INTRAVENOUS
  Administered 2020-11-26: 12:00:00 1200 [IU]/h via INTRAVENOUS
  Administered 2020-11-27 – 2020-11-28 (×3): 1450 [IU]/h via INTRAVENOUS
  Administered 2020-11-29: 15:00:00 1350 [IU]/h via INTRAVENOUS
  Administered 2020-11-30: 1300 [IU]/h via INTRAVENOUS
  Administered 2020-12-01: 1400 [IU]/h via INTRAVENOUS
  Filled 2020-11-25 (×11): qty 250

## 2020-11-25 MED ORDER — SENNOSIDES-DOCUSATE SODIUM 8.6-50 MG PO TABS
1.0000 | ORAL_TABLET | Freq: Every evening | ORAL | Status: DC | PRN
  Administered 2020-11-30: 1
  Filled 2020-11-25: qty 1

## 2020-11-25 MED ORDER — ASPIRIN 81 MG PO CHEW
324.0000 mg | CHEWABLE_TABLET | Freq: Every day | ORAL | Status: DC
  Administered 2020-11-25 – 2020-11-28 (×4): 324 mg
  Filled 2020-11-25 (×4): qty 4

## 2020-11-25 MED ORDER — PROSOURCE TF PO LIQD
45.0000 mL | Freq: Two times a day (BID) | ORAL | Status: DC
  Administered 2020-11-25 – 2020-11-26 (×2): 45 mL
  Filled 2020-11-25 (×2): qty 45

## 2020-11-25 MED ORDER — MAGNESIUM SULFATE 2 GM/50ML IV SOLN
2.0000 g | Freq: Once | INTRAVENOUS | Status: AC
  Administered 2020-11-25: 2 g via INTRAVENOUS
  Filled 2020-11-25: qty 50

## 2020-11-25 MED ORDER — HEPARIN SODIUM (PORCINE) 5000 UNIT/ML IJ SOLN
5000.0000 [IU] | Freq: Three times a day (TID) | INTRAMUSCULAR | Status: DC
  Administered 2020-11-25 (×2): 5000 [IU] via SUBCUTANEOUS
  Filled 2020-11-25 (×2): qty 1

## 2020-11-25 MED ORDER — CHLORHEXIDINE GLUCONATE CLOTH 2 % EX PADS
6.0000 | MEDICATED_PAD | Freq: Every day | CUTANEOUS | Status: DC
  Administered 2020-11-26 – 2020-12-01 (×6): 6 via TOPICAL

## 2020-11-25 MED ORDER — VITAL HIGH PROTEIN PO LIQD
1000.0000 mL | ORAL | Status: DC
  Administered 2020-11-25: 1000 mL

## 2020-11-25 MED ORDER — ASPIRIN 81 MG PO CHEW: 324.0000 mg | CHEWABLE_TABLET | Freq: Every day | ORAL | Status: DC

## 2020-11-25 MED ORDER — POTASSIUM CHLORIDE 20 MEQ PO PACK
40.0000 meq | PACK | Freq: Once | ORAL | Status: AC
  Administered 2020-11-25: 40 meq
  Filled 2020-11-25: qty 2

## 2020-11-25 MED ORDER — POTASSIUM CHLORIDE 10 MEQ/100ML IV SOLN
10.0000 meq | INTRAVENOUS | Status: AC
Start: 2020-11-25 — End: 2020-11-25
  Administered 2020-11-25 (×4): 10 meq via INTRAVENOUS
  Filled 2020-11-25 (×4): qty 100

## 2020-11-25 NOTE — Progress Notes (Signed)
VASCULAR LAB    Bilateral lower extremity venous duplex has been performed.  See CV proc for preliminary results.  Gave verbal results to RN.  Messaged Dr. Shearon Stalls via secure chat.  Derrich Gaby, RVT 11/25/2020, 12:48 PM

## 2020-11-25 NOTE — Progress Notes (Signed)
SLP Cancellation Note  Patient Details Name: Emily Lozano MRN: [id number] DOB: [birth year]   Cancelled treatment:       Reason Eval/Treat Not Completed: Medical issues which prohibited therapy. Patient currently intubated and sedated. Will follow for readiness for SLE.   Sonia Baller, MA, CCC-SLP Speech Therapy [facility] Acute Rehab

## 2020-11-25 NOTE — Progress Notes (Signed)
Referring Physician(s): Code stroke- Amie [place] (neurology)  Supervising Physician: Pedro Earls  Patient Status:  [facility] - In-pt  Chief Complaint: Stroke F/U  Subjective:  History of acute CVA s/p cerebral arteriogram with emergent mechanical thrombectomy of left MCA M2 occlusion achieving a TICI 2b revascularization via right femoral approach 11/24/2020 by Dr. Karenann Cai. Patient laying in bed, intubated and heavily sedated. She does not open eyes to painful stimuli and does not follow simple commands. No withdraw of extremities to pain. Right femoral puncture site c/d/i.   Allergies: Morphine and related  Medications: Prior to Admission medications   Medication Sig Start Date End Date Taking? Authorizing Provider  aspirin EC 81 MG tablet Take 81 mg by mouth daily.    [provider]  buPROPion (WELLBUTRIN XL) 300 MG 24 hr tablet Take 300 mg by mouth daily.    [provider]  cetirizine (ZYRTEC ALLERGY) 10 MG tablet Take 1 tablet (10 mg total) by mouth daily. 05/25/18   Flora Lipps, MD  escitalopram (LEXAPRO) 10 MG tablet Take 10 mg by mouth daily.    [provider]  fluticasone-salmeterol (ADVAIR HFA) 230-21 MCG/ACT inhaler Inhale 2 puffs into the lungs 2 (two) times daily. 05/25/18   Flora Lipps, MD  folic acid (FOLVITE) 1 MG tablet Take 1 mg by mouth daily.    [provider]  glimepiride (AMARYL) 2 MG tablet Take 1 tablet by mouth daily. 11/04/17   [provider]  hydroxychloroquine (PLAQUENIL) 200 MG tablet Take 400 mg by mouth daily.    [provider]  losartan-hydrochlorothiazide (HYZAAR) 100-12.5 MG tablet Take 1 tablet by mouth daily.    [provider]  metFORMIN (GLUCOPHAGE) 500 MG tablet Take 1,000 mg by mouth 2 (two) times daily with a meal.    [provider]  methotrexate (RHEUMATREX) 2.5 MG tablet Take 2.5 mg by mouth once a week. Caution:Chemotherapy. Protect  from light.    [provider]  metoprolol succinate (TOPROL-XL) 100 MG 24 hr tablet Take 100 mg by mouth daily. Take with or immediately following a meal.    [provider]  pantoprazole (PROTONIX) 40 MG tablet Take 40 mg by mouth 2 (two) times daily.    [provider]  rosuvastatin (CRESTOR) 10 MG tablet Take 10 mg by mouth daily.    [provider]     Vital Signs: BP 138/73   Pulse 76   Temp 99.5 F (37.5 C) (Axillary)   Resp 12   Ht 5\' 4"  (1.626 m)   Wt 176 lb 5.9 oz (80 kg)   SpO2 99%   BMI 30.27 kg/m   Physical Exam Vitals and nursing note reviewed.  Constitutional:      General: She is not in acute distress.    Comments: Intubated and heavily sedated.  Pulmonary:     Effort: Pulmonary effort is normal. No respiratory distress.     Comments: Intubated and heavily sedated. Skin:    General: Skin is warm and dry.     Comments: Right femoral puncture site soft without active bleeding or hematoma.  Neurological:     Comments: Intubated and heavily sedated. She does not open eyes to painful stimuli and does not follow simple commands. PERRL bilaterally. No withdraw of extremities to pain. Distal pulses (DPs) 1+ bilaterally.     Imaging: MR BRAIN WO CONTRAST  Result Date: 11/25/2020 CLINICAL DATA:  Acute stroke presentation 11/24/2020. Left insular infarction. EXAM: MRI  HEAD WITHOUT CONTRAST TECHNIQUE: Multiplanar, multiecho pulse sequences of the brain and surrounding structures were obtained without intravenous contrast. COMPARISON:  CT and interventional studies over the last day. FINDINGS: Brain: Diffusion imaging shows acute infarction in the deep insula and left frontoparietal cortical and subcortical brain. Mild swelling in the region. There are either minimal petechial blood products or there is susceptibility artifact related to thrombosed vessels. No frank hematoma. No mass effect or shift. Elsewhere, the brainstem and  cerebellum are unremarkable. Both cerebral hemispheres show moderate to severe chronic small-vessel ischemic changes throughout the white matter. No hydrocephalus. No extra-axial collection. Vascular: Major vessels at base of the brain show flow. Skull and upper cervical spine: Negative Sinuses/Orbits: Mucosal inflammatory changes of paranasal sinuses. Orbits negative. Other: None IMPRESSION: 1. Acute infarction in the left MCA territory affecting the deep insula and left frontoparietal cortical and subcortical brain. Minimal petechial blood products or susceptibility artifact related to thrombosed vessels. No frank hematoma. No mass effect or shift. 2. Moderate to severe chronic small-vessel ischemic changes elsewhere throughout the cerebral hemispheric white matter. Electronically Signed   By: Nelson Chimes M.D.   On: 11/25/2020 02:18   CT CEREBRAL PERFUSION W CONTRAST  Result Date: 11/24/2020 CLINICAL DATA:  Neuro deficit, acute, stroke suspected EXAM: CT PERFUSION BRAIN TECHNIQUE: Multiphase CT imaging of the brain was performed following IV bolus contrast injection. Subsequent parametric perfusion maps were calculated using RAPID software. CONTRAST:  31mL OMNIPAQUE IOHEXOL 350 MG/ML SOLN COMPARISON:  Concurrent CTA head and neck. FINDINGS: CT Brain Perfusion Findings: CBF (<30%) Volume: 56mL Perfusion (Tmax>6.0s) volume: 25mL Mismatch Volume: 40mL ASPECTS on noncontrast CT Head: 9 at 16:42 today. Infarct Core: 0 mL Infarction Location:Not applicable. IMPRESSION: Perfusion imaging demonstrates no infarct core. 30 mL region of ischemia involving the left MCA territory. Electronically Signed   By: Primitivo Gauze M.D.   On: 11/24/2020 16:55   DG Chest Port 1 View  Result Date: 11/24/2020 CLINICAL DATA:  COVID positive EXAM: PORTABLE CHEST 1 VIEW COMPARISON:  None. FINDINGS: The heart size and mediastinal contours are mildly enlarged. ETT is 4 cm above the carina. NG tube is seen below the diaphragm.  Aortic knob calcifications are seen. There is hazy patchy airspace opacity seen at the periphery of the right lower lung and at the left lung base. No pleural effusion. IMPRESSION: ETT and NG tube in satisfactory position Bilateral multifocal airspace opacities, consistent with multifocal pneumonia Electronically Signed   By: Prudencio Pair M.D.   On: 11/24/2020 22:14   CT HEAD CODE STROKE WO CONTRAST  Result Date: 11/24/2020 CLINICAL DATA:  Code stroke.  Neuro deficit, acute, stroke suspected EXAM: CT HEAD WITHOUT CONTRAST TECHNIQUE: Contiguous axial images were obtained from the base of the skull through the vertex without intravenous contrast. COMPARISON:  06/27/2020. FINDINGS: Brain: No intracranial hemorrhage. New focal hypodensity involving the left insula. No mass lesion. No midline shift, ventriculomegaly or extra-axial fluid collection. Chronic microvascular ischemic changes. Vascular: No hyperdense vessel or unexpected calcification. Minimal carotid siphon atherosclerotic calcifications. Skull: Negative for fracture or focal lesion. Sinuses/Orbits: No acute finding.  Mild pansinus mucosal thickening. Other: Right parietal scalp nodule is unchanged. ASPECTS [facility] Stroke Program Early CT Score) - Ganglionic level infarction (caudate, lentiform nuclei, internal capsule, insula, M1-M3 cortex): 6 - Supraganglionic infarction (M4-M6 cortex): 3 Total score (0-10 with 10 being normal): 9 IMPRESSION: 1. 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 2. Chronic microvascular ischemic changes. 3. ASPECTS is 9 Code stroke imaging results were communicated  on 11/24/2020 at 4:23 pm to provider Dr. Rory Percy via secure text paging. Electronically Signed   By: Primitivo Gauze M.D.   On: 11/24/2020 16:24   CT ANGIO HEAD CODE STROKE  Result Date: 11/24/2020 CLINICAL DATA:  Neuro deficit, acute, stroke suspected EXAM: CT ANGIOGRAPHY HEAD AND NECK TECHNIQUE: Multidetector CT imaging of the head and neck was  performed using the standard protocol during bolus administration of intravenous contrast. Multiplanar CT image reconstructions and MIPs were obtained to evaluate the vascular anatomy. Carotid stenosis measurements (when applicable) are obtained utilizing NASCET criteria, using the distal internal carotid diameter as the denominator. CONTRAST:  33mL OMNIPAQUE IOHEXOL 350 MG/ML SOLN COMPARISON:  None. FINDINGS: CTA NECK FINDINGS Aortic arch: Standard branching. Mild aortic arch atherosclerotic calcifications. Mild proximal left subclavian artery narrowing secondary to atheromatous plaque. Otherwise patent great vessel origins. Right carotid system: Patent. Minimal proximal ICA atherosclerotic calcifications without significant narrowing. Left carotid system: Patent. 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 Vertebral arteries: Mild left vertebral artery origin narrowing secondary to atheromatous disease. Otherwise patent and codominant. Skeleton: No acute finding.  Mild spondylosis. Other neck: No adenopathy.  No soft tissue mass. Upper chest: Patchy and nodular bilateral upper lung opacities concerning for infectious/inflammatory foci. Review of the MIP images confirms the above findings CTA HEAD FINDINGS Anterior circulation: Minimal carotid siphon atherosclerotic calcifications. Patent ICAs. Patent ophthalmic artery origins. Right A1 segment hypoplasia. Patent ACAs. Patent right MCA. Left M2 segment occlusion. Posterior circulation: Patent V4 segments. Patent right PICA. Patent basilar and superior cerebellar arteries. Patent bilateral PCAs. Venous sinuses: As permitted by contrast timing, patent. Anatomic variants: Please see above. Review of the MIP images confirms the above findings IMPRESSION: CTA neck: 30-40% distal left CCA narrowing secondary to atheromatous plaque. Mild narrowing of the proximal left subclavian and left vertebral artery origin. Bilateral upper lung nodular/patchy  opacities concerning for infectious/inflammatory foci. CTA head: Left M2 branch occlusion. No aneurysm or dissection. Code stroke imaging results were communicated on 11/24/2020 at 4:41 pm to provider Dr. Rory Percy via secure text paging. Electronically Signed   By: Primitivo Gauze M.D.   On: 11/24/2020 16:41   CT ANGIO NECK CODE STROKE  Result Date: 11/24/2020 CLINICAL DATA:  Neuro deficit, acute, stroke suspected EXAM: CT ANGIOGRAPHY HEAD AND NECK TECHNIQUE: Multidetector CT imaging of the head and neck was performed using the standard protocol during bolus administration of intravenous contrast. Multiplanar CT image reconstructions and MIPs were obtained to evaluate the vascular anatomy. Carotid stenosis measurements (when applicable) are obtained utilizing NASCET criteria, using the distal internal carotid diameter as the denominator. CONTRAST:  37mL OMNIPAQUE IOHEXOL 350 MG/ML SOLN COMPARISON:  None. FINDINGS: CTA NECK FINDINGS Aortic arch: Standard branching. Mild aortic arch atherosclerotic calcifications. Mild proximal left subclavian artery narrowing secondary to atheromatous plaque. Otherwise patent great vessel origins. Right carotid system: Patent. Minimal proximal ICA atherosclerotic calcifications without significant narrowing. Left carotid system: Patent. 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 Vertebral arteries: Mild left vertebral artery origin narrowing secondary to atheromatous disease. Otherwise patent and codominant. Skeleton: No acute finding.  Mild spondylosis. Other neck: No adenopathy.  No soft tissue mass. Upper chest: Patchy and nodular bilateral upper lung opacities concerning for infectious/inflammatory foci. Review of the MIP images confirms the above findings CTA HEAD FINDINGS Anterior circulation: Minimal carotid siphon atherosclerotic calcifications. Patent ICAs. Patent ophthalmic artery origins. Right A1 segment hypoplasia. Patent ACAs. Patent right MCA.  Left M2 segment occlusion. Posterior circulation: Patent V4 segments.  Patent right PICA. Patent basilar and superior cerebellar arteries. Patent bilateral PCAs. Venous sinuses: As permitted by contrast timing, patent. Anatomic variants: Please see above. Review of the MIP images confirms the above findings IMPRESSION: CTA neck: 30-40% distal left CCA narrowing secondary to atheromatous plaque. Mild narrowing of the proximal left subclavian and left vertebral artery origin. Bilateral upper lung nodular/patchy opacities concerning for infectious/inflammatory foci. CTA head: Left M2 branch occlusion. No aneurysm or dissection. Code stroke imaging results were communicated on 11/24/2020 at 4:41 pm to provider Dr. Rory Percy via secure text paging. Electronically Signed   By: Primitivo Gauze M.D.   On: 11/24/2020 16:41    Labs:  CBC: Recent Labs    11/24/20 1602 11/24/20 1604 11/24/20 2036 11/24/20 2058  WBC 5.5  --   --  5.3  HGB 11.7* 11.9* 10.9* 11.9*  HCT 37.5 35.0* 32.0* 40.3  PLT 403*  --   --  136*    COAGS: Recent Labs    11/24/20 1602  INR SPECIMEN CLOTTED    BMP: Recent Labs    11/24/20 1602 11/24/20 1604 11/24/20 2036 11/25/20 0608  NA 137 141 144 143  K 3.7 3.8 2.9* 2.8*  CL 105 106  --  107  CO2 19*  --   --  23  GLUCOSE 285* 288*  --  202*  BUN 13 16  --  9  CALCIUM 8.6*  --   --  8.1*  CREATININE 0.75 0.60  --  0.89  GFRNONAA >60  --   --  >60    LIVER FUNCTION TESTS: Recent Labs    11/24/20 1602 11/25/20 0608  BILITOT 0.7 <0.1*  AST 18 17  ALT 13 14  ALKPHOS 55 55  PROT 6.9 6.3*  ALBUMIN 2.9* 2.5*    Assessment and Plan:  History of acute CVA s/p cerebral arteriogram with emergent mechanical thrombectomy of left MCA M2 occlusion achieving a TICI 2b revascularization via right femoral approach 11/24/2020 by Dr. Karenann Cai. Patient's condition stable- remains intubated/heavily sedated, does not open eyes to painful stimuli and does not follow  simple commands, no withdraw of extremities to pain. Right femoral puncture site stable, distal pulses (DPs) 1+ bilaterally. BP goal = [id number] systolic x 24 hours post-procedure. Further plans per neurology/CCM- appreciate and agree with management. NIR to follow.   Electronically Signed: Earley Abide, PA-C 11/25/2020, 9:24 AM   I spent a total of 15 Minutes at the the patient's bedside AND on the patient's hospital floor or unit, greater than 50% of which was counseling/coordinating care for CVA s/p revascularization.

## 2020-11-25 NOTE — Consult Note (Addendum)
NAME:  Emily Lozano, MRN:  [id number], DOB:  [birth year], LOS: 1 ADMISSION DATE:  11/24/2020, CONSULTATION DATE:  11/24/2020  REFERRING MD:  Rory Percy - neuro, CHIEF COMPLAINT:  L M2 CVA, Covid infection, respiratory failure  Brief History:  70 yo admitted 2/26 with L M2 occlusion, outside of tpa window. Went to [facility] for thrombectomy, unsuccessful, c/b bleed.Resulted + for Covid in NIR, will remain intubated. Reportedly has had some mild URI sx x 1-2 wks (had tested negative OP in this time frame). hypertension, hyperlipidemia, type 2 diabetes mellitus, depression, GERD, melanoma, sleep apnea, and recent exposure to COVID-19  History of Present Illness:  70 yo F PMH HTN HLD Dm2 who presented as a code stroke 2/26 with acute R hemiparesis, L gaze preference, aphasia. KWN 10:30 AM -- outside of window for tpa. CTA head reveals L M2 occlusion, CTA cp with L MCA ischemia. Taken to [facility] for thrombectomy -- c/b bleed, thrombectomy unsuccessful.In NIR, COVID-19 resulted positive. Reportedly, pts family had +Covid recently, pt had URI sx and was tested for COVID 2/25 - and was negative, started on ZPack at that time.  Repeat neuroimaging is pending during [place] will be to remain intubated following case and transfer to ICU. PCCM consulted in this setting   Past Medical History:  HTN HLD DM2    has a past medical history of Anemia, Arthritis, Collagen vascular disease ([place]), Depression, Diabetes mellitus without complication ([place]), Hypertension, and Melanoma ([place]) (2014).   reports that she has quit smoking. She has never used smokeless tobacco.  Past Surgical History:  Procedure Laterality Date  . ABDOMINAL HYSTERECTOMY    . BREAST LUMPECTOMY    . CARPAL TUNNEL RELEASE    . ESOPHAGEAL MANOMETRY N/A 12/05/2015   Procedure: ESOPHAGEAL MANOMETRY (EM);  Surgeon: Josefine Class, MD;  Location: [facility] ENDOSCOPY;  Service: Endoscopy;  Laterality: N/A;  . ESOPHAGOGASTRODUODENOSCOPY (EGD) WITH  PROPOFOL N/A 11/26/2015   Procedure: ESOPHAGOGASTRODUODENOSCOPY (EGD) WITH PROPOFOL;  Surgeon: Lollie Sails, MD;  Location: [facility] ENDOSCOPY;  Service: Endoscopy;  Laterality: N/A;  . ESOPHAGOGASTRODUODENOSCOPY (EGD) WITH PROPOFOL N/A 04/30/2017   Procedure: ESOPHAGOGASTRODUODENOSCOPY (EGD) WITH PROPOFOL;  Surgeon: Lollie Sails, MD;  Location: [facility] ENDOSCOPY;  Service: Endoscopy;  Laterality: N/A;  . EXCISION MELANOMA WITH SENTINEL LYMPH NODE BIOPSY      Allergies  Allergen Reactions  . Morphine And Related Nausea And Vomiting    Immunization History  Administered Date(s) Administered  . PFIZER(Purple Top)SARS-COV-2 Vaccination 01/04/2020, 01/25/2020    No family history on file.   Current Facility-Administered Medications:  .  0.9 %  sodium chloride infusion, , Intravenous, Continuous, Amie Portland, MD, Last Rate: 75 mL/hr at 11/25/20 1300, Infusion Verify at 11/25/20 1300 .  acetaminophen (TYLENOL) tablet 650 mg, 650 mg, Oral, Q4H PRN **OR** acetaminophen (TYLENOL) 160 MG/5ML solution 650 mg, 650 mg, Per Tube, Q4H PRN, 650 mg at 11/25/20 1043 **OR** acetaminophen (TYLENOL) suppository 650 mg, 650 mg, Rectal, Q4H PRN, Amie Portland, MD .  aspirin chewable tablet 324 mg, 324 mg, Per Tube, Daily, Amie Portland, MD, 324 mg at 11/25/20 1043 .  chlorhexidine gluconate (MEDLINE KIT) (PERIDEX) 0.12 % solution 15 mL, 15 mL, Mouth Rinse, BID, Amie Portland, MD, 15 mL at 11/25/20 2355 .  Chlorhexidine Gluconate Cloth 2 % PADS 6 each, 6 each, Topical, Daily, Amie Portland, MD .  clevidipine (CLEVIPREX) infusion 0.5 mg/mL, 0-21 mg/hr, Intravenous, Continuous, Bhagat, Srishti L, MD, Last Rate: 32 mL/hr at 11/25/20 1300, 16 mg/hr  at 11/25/20 1300 .  dextrose 50 % solution 0-50 mL, 0-50 mL, Intravenous, PRN, Ellender, Ryan P, MD .  heparin injection 5,000 Units, 5,000 Units, Subcutaneous, Q8H, Bhagat, Srishti L, MD, 5,000 Units at 11/25/20 1322 .  insulin aspart (novoLOG) injection 0-20 Units,  0-20 Units, Subcutaneous, Q4H, Brand Males, MD, 7 Units at 11/25/20 1320 .  insulin regular, human (MYXREDLIN) 100 units/ 100 mL infusion, , Intravenous, Continuous, Ellender, Ryan P, MD .  MEDLINE mouth rinse, 15 mL, Mouth Rinse, 10 times per day, Amie Portland, MD, 15 mL at 11/25/20 1200 .  mometasone-formoterol (DULERA) 100-5 MCG/ACT inhaler 2 puff, 2 puff, Inhalation, BID, Ramaswamy, Murali, MD .  pantoprazole (PROTONIX) injection 40 mg, 40 mg, Intravenous, Q24H, Ramaswamy, Murali, MD, 40 mg at 11/24/20 2227 .  potassium chloride 10 mEq in 100 mL IVPB, 10 mEq, Intravenous, Q1 Hr x 4, Rolla Flatten, [facility], Last Rate: 100 mL/hr at 11/25/20 1335, 10 mEq at 11/25/20 1335 .  propofol (DIPRIVAN) 1000 MG/100ML infusion, 5-80 mcg/kg/min, Intravenous, Titrated, Oletta Darter Virgina Evener, MD, Stopped at 11/25/20 1159 .  [COMPLETED] remdesivir 200 mg in sodium chloride 0.9% 250 mL IVPB, 200 mg, Intravenous, Once, Stopping Infusion hung by another clincian at 11/25/20 0932 **FOLLOWED BY** remdesivir 100 mg in sodium chloride 0.9 % 100 mL IVPB, 100 mg, Intravenous, Daily, Erenest Blank, RPH, Stopped at 11/25/20 1122 .  senna-docusate (Senokot-S) tablet 1 tablet, 1 tablet, Per Tube, QHS PRN, Amie Portland, MD .  sodium chloride flush (NS) 0.9 % injection 3 mL, 3 mL, Intravenous, Once, Hayden Rasmussen, MD    Significant Hospital Events:  11/24/2020 - admit => Left distal M2/MCA middle division branch occlusion vi RCFA. : There was a left M2/MCA middle division branch occlusion with likely atherosclerotic plaque underlying. Two direct contact aspiration passes performed with recanalization of the proximal aspect of the M2 branch and persistent distal occlusion. Two passes were then performed with stent retriever with persistent occlusion. Flat panel head CT showed contrast extravasation in the left sylvian fissure. Delayed angiograms and delayed flat panel CT showed no evidence of active  extravasation.  Consults:  11/24/2020  - ccm  Procedures:  2/26 - see events  Significant Diagnostic Tests:  2.26 - see evnts  Micro Data:  11/24/2020 - covid +v  Antimicrobials:  2/26 - remdesivir  Interim History / Subjective:  2/.26 - seen by CCM in [facility] [place] [facility]. She is on ventilator. . On diprivan. OFf neo. Per RN - got started on insulin gtt by anesthesia. Pulse ox was 97% RA pre intubation  Objective   Blood pressure (!) 119/55, pulse 91, temperature 97.7 F (36.5 C), temperature source Axillary, resp. rate 19, height _0  (1.626 m), weight 80 kg, SpO2 100 %.    Vent Mode: CPAP;PSV FiO2 (%):  [40 %] 40 % Set Rate:  [10 bmp-14 bmp] 14 bmp Vt Set:  [430 mL-500 mL] 430 mL PEEP:  [5 cmH20] 5 cmH20 Pressure Support:  [12 cmH20] 12 cmH20 Plateau Pressure:  [16 cmH20-18 cmH20] 16 cmH20   Intake/Output Summary (Last 24 hours) at 11/25/2020 1347 Last data filed at 11/25/2020 1300 Gross per 24 hour  Intake 2562.6 ml  Output 925 ml  Net 1637.6 ml   Filed Weights   11/24/20 1909  Weight: 80 kg    Examination: General: obese female on ventilator. Lying flat HENT: ETT tube + Lungs: CTA bialterally. 40% fio2,, sync with vent Cardiovascular: normal heart sounds Abdomen: soft Extremities: intact Neuro:  left side with good tone. Right side flaccid GU: not examined  Resolved Hospital Problem list   x  Assessment & Plan:  ASSESSMENT / PLAN:  Emily Lozano is 70 y.o. woman who presents with acute stroke:  Acute ischemic stroke Feb 26th, Left M2 MCA.  Hypertensive Emergency NIH stroke score 16 at admit. - in setting of covid and other risk factors - covid coagulopathy - Out of window for tPA, s/p mechanical thrombectomy with persistent distal occlusion - BP goals per neurology. On cleviprex - Labetalol prn - Hydralazine prn - antiplatelets per neuro - RASS goal 0 to -2.   Acute respiratory failure post op due to stroke in setting of covid.  COVID 19  positive, sympomatic prior to admission - Remdesevir for covid infection - Maintan full vent support with LTVV Will assess daily with SAT and SBT VAP bundle On dulera at home   Acute lower extremity DVT - in the setting of covid 19 infection - low intensity heparin drip, no bolus - watch mental status closely, repeat head ct if change  DM on Metformin and Amaryl at home Hold oral hypoglycemics Check [id number] SSI  History of Rheumatoid arthritis Immune suppressed - on Plaquenil and Methotrexate hold mtx  Depression/Anxiety  -on lexapro and wellbutrin at home  Best practice (evaluated daily)  Diet: npo Pain/Anxiety/Delirium protocol (if indicated): diprivan VAP protocol (if indicated): yes DVT prophylaxis: scd + (lovenox tbd by neuro after repeat ct) GI prophylaxis: ppi Glucose control: ssi Mobility: BR Disposition: ICU  Family update: Updated patient's son Ruby Cola by phone 2/27.   The patient is critically ill due to respiratory failure, acute stroke, hypertensive emergency.  Critical care was necessary to treat or prevent imminent or life-threatening deterioration.  Critical care was time spent personally by me on the following activities: development of treatment plan with patient and/or surrogate as well as nursing, discussions with consultants, evaluation of patient's response to treatment, examination of patient, obtaining history from patient or surrogate, ordering and performing treatments and interventions, ordering and review of laboratory studies, ordering and review of radiographic studies, pulse oximetry, re-evaluation of patient's condition and participation in multidisciplinary rounds.   Critical Care Time devoted to patient care services described in this note is 47 minutes. This time reflects time of care of this [place] . This critical care time does not reflect separately billable procedures or procedure time, teaching time or supervisory time of  PA/NP/Med student/Med Resident etc but could involve care discussion time.       Spero Geralds [place] 11/25/2020 2:07 PM  Pager: see AMION After hours pager: [phone]  If no response to pager , please call [phone] until 7pm After 7:00 pm call Elink  [phone]      [facility]  Lab 11/24/20 1604 11/24/20 2036  PHART  --  7.418  PCO2ART  --  37.4  PO2ART  --  97  HCO3  --  24.1  TCO2 26 25  O2SAT  --  98.0    CBC Recent Labs  Lab 11/24/20 1602 11/24/20 1604 11/24/20 2036 11/24/20 2058  HGB 11.7* 11.9* 10.9* 11.9*  HCT 37.5 35.0* 32.0* 40.3  WBC 5.5  --   --  5.3  PLT 403*  --   --  136*    COAGULATION Recent Labs  Lab 11/24/20 1602  INR SPECIMEN CLOTTED    CARDIAC  No results for input(s): TROPONINI  in the last 168 hours. No results for input(s): PROBNP in the last 168 hours.   CHEMISTRY Recent Labs  Lab 11/24/20 1602 11/24/20 1604 11/24/20 2036 11/25/20 0608  NA 137 141 144 143  K 3.7 3.8 2.9* 2.8*  CL 105 106  --  107  CO2 19*  --   --  23  GLUCOSE 285* 288*  --  202*  BUN 13 16  --  9  CREATININE 0.75 0.60  --  0.89  CALCIUM 8.6*  --   --  8.1*  MG  --   --   --  1.9  PHOS  --   --   --  2.7   Estimated Creatinine Clearance: 61 mL/min (by C-G formula based on SCr of 0.89 mg/dL).   LIVER Recent Labs  Lab 11/24/20 1602 11/25/20 0608  AST 18 17  ALT 13 14  ALKPHOS 55 55  BILITOT 0.7 <0.1*  PROT 6.9 6.3*  ALBUMIN 2.9* 2.5*  INR SPECIMEN CLOTTED  --      INFECTIOUS Recent Labs  Lab 11/24/20 2058 11/25/20 0544 11/25/20 0608  LATICACIDVEN 2.4* 2.5*  --   PROCALCITON <0.10  --  <0.10     ENDOCRINE CBG (last 3)  Recent Labs    11/25/20 0413 11/25/20 0842 11/25/20 1308  GLUCAP 171* 142* 242*

## 2020-11-25 NOTE — Plan of Care (Signed)
Pt spiked fever 102 Blodd c/s ordered. PCCM aware.  -- Amie Portland, MD Neurologist [facility] Pager: [phone]

## 2020-11-25 NOTE — Progress Notes (Signed)
STROKE TEAM PROGRESS NOTE   INTERVAL HISTORY No overnight events or new complaints. No family bedside.  Vitals:   11/25/20 1330 11/25/20 1415 11/25/20 1430 11/25/20 1445  BP: (!) 119/55 134/65 136/64 139/64  Pulse: 91 93 89   Resp: 19 16 18 19   Temp:      TempSrc:      SpO2: 100% 99% 97%   Weight:      Height:       CBC:  Recent Labs  Lab 11/24/20 1602 11/24/20 1604 11/24/20 2036 11/24/20 2058  WBC 5.5  --   --  5.3  NEUTROABS 4.1  --   --   --   HGB 11.7*   < > 10.9* 11.9*  HCT 37.5   < > 32.0* 40.3  MCV 80.6  --   --  81.6  PLT 403*  --   --  136*   < > = values in this interval not displayed.   Basic Metabolic Panel:  Recent Labs  Lab 11/24/20 1602 11/24/20 1604 11/24/20 2036 11/25/20 0608  NA 137 141 144 143  K 3.7 3.8 2.9* 2.8*  CL 105 106  --  107  CO2 19*  --   --  23  GLUCOSE 285* 288*  --  202*  BUN 13 16  --  9  CREATININE 0.75 0.60  --  0.89  CALCIUM 8.6*  --   --  8.1*  MG  --   --   --  1.9  PHOS  --   --   --  2.7   Lipid Panel:  Recent Labs  Lab 11/25/20 0608  CHOL 139  TRIG 418*  HDL 15*  CHOLHDL 9.3  VLDL UNABLE TO CALCULATE IF TRIGLYCERIDE OVER 400 mg/dL  LDLCALC UNABLE TO CALCULATE IF TRIGLYCERIDE OVER 400 mg/dL   HgbA1c:  Recent Labs  Lab 11/25/20 0608  HGBA1C 10.6*   Urine Drug Screen: No results for input(s): LABOPIA, COCAINSCRNUR, LABBENZ, AMPHETMU, THCU, LABBARB in the last 168 hours.  Alcohol Level No results for input(s): ETH in the last 168 hours.  IMAGING past 24 hours MR BRAIN WO CONTRAST  Result Date: 11/25/2020 CLINICAL DATA:  Acute stroke presentation 11/24/2020. Left insular infarction. EXAM: MRI HEAD WITHOUT CONTRAST TECHNIQUE: Multiplanar, multiecho pulse sequences of the brain and surrounding structures were obtained without intravenous contrast. COMPARISON:  CT and interventional studies over the last day. FINDINGS: Brain: Diffusion imaging shows acute infarction in the deep insula and left frontoparietal  cortical and subcortical brain. Mild swelling in the region. There are either minimal petechial blood products or there is susceptibility artifact related to thrombosed vessels. No frank hematoma. No mass effect or shift. Elsewhere, the brainstem and cerebellum are unremarkable. Both cerebral hemispheres show moderate to severe chronic small-vessel ischemic changes throughout the white matter. No hydrocephalus. No extra-axial collection. Vascular: Major vessels at base of the brain show flow. Skull and upper cervical spine: Negative Sinuses/Orbits: Mucosal inflammatory changes of paranasal sinuses. Orbits negative. Other: None IMPRESSION: 1. Acute infarction in the left MCA territory affecting the deep insula and left frontoparietal cortical and subcortical brain. Minimal petechial blood products or susceptibility artifact related to thrombosed vessels. No frank hematoma. No mass effect or shift. 2. Moderate to severe chronic small-vessel ischemic changes elsewhere throughout the cerebral hemispheric white matter. Electronically Signed   By: Nelson Chimes M.D.   On: 11/25/2020 02:18   CT CEREBRAL PERFUSION W CONTRAST  Result Date: 11/24/2020 CLINICAL DATA:  Neuro deficit,  acute, stroke suspected EXAM: CT PERFUSION BRAIN TECHNIQUE: Multiphase CT imaging of the brain was performed following IV bolus contrast injection. Subsequent parametric perfusion maps were calculated using RAPID software. CONTRAST:  63mL OMNIPAQUE IOHEXOL 350 MG/ML SOLN COMPARISON:  Concurrent CTA head and neck. FINDINGS: CT Brain Perfusion Findings: CBF (<30%) Volume: 93mL Perfusion (Tmax>6.0s) volume: 75mL Mismatch Volume: 58mL ASPECTS on noncontrast CT Head: 9 at 16:42 today. Infarct Core: 0 mL Infarction Location:Not applicable. IMPRESSION: Perfusion imaging demonstrates no infarct core. 30 mL region of ischemia involving the left MCA territory. Electronically Signed   By: Primitivo Gauze M.D.   On: 11/24/2020 16:55   DG Chest Port 1  View  Result Date: 11/24/2020 CLINICAL DATA:  COVID positive EXAM: PORTABLE CHEST 1 VIEW COMPARISON:  None. FINDINGS: The heart size and mediastinal contours are mildly enlarged. ETT is 4 cm above the carina. NG tube is seen below the diaphragm. Aortic knob calcifications are seen. There is hazy patchy airspace opacity seen at the periphery of the right lower lung and at the left lung base. No pleural effusion. IMPRESSION: ETT and NG tube in satisfactory position Bilateral multifocal airspace opacities, consistent with multifocal pneumonia Electronically Signed   By: Prudencio Pair M.D.   On: 11/24/2020 22:14   CT HEAD CODE STROKE WO CONTRAST  Result Date: 11/24/2020 CLINICAL DATA:  Code stroke.  Neuro deficit, acute, stroke suspected EXAM: CT HEAD WITHOUT CONTRAST TECHNIQUE: Contiguous axial images were obtained from the base of the skull through the vertex without intravenous contrast. COMPARISON:  06/27/2020. FINDINGS: Brain: No intracranial hemorrhage. New focal hypodensity involving the left insula. No mass lesion. No midline shift, ventriculomegaly or extra-axial fluid collection. Chronic microvascular ischemic changes. Vascular: No hyperdense vessel or unexpected calcification. Minimal carotid siphon atherosclerotic calcifications. Skull: Negative for fracture or focal lesion. Sinuses/Orbits: No acute finding.  Mild pansinus mucosal thickening. Other: Right parietal scalp nodule is unchanged. ASPECTS [facility] Stroke Program Early CT Score) - Ganglionic level infarction (caudate, lentiform nuclei, internal capsule, insula, M1-M3 cortex): 6 - Supraganglionic infarction (M4-M6 cortex): 3 Total score (0-10 with 10 being normal): 9 IMPRESSION: 1. 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 2. Chronic microvascular ischemic changes. 3. ASPECTS is 9 Code stroke imaging results were communicated on 11/24/2020 at 4:23 pm to provider Dr. Rory Percy via secure text paging. Electronically Signed   By: Primitivo Gauze M.D.   On: 11/24/2020 16:24   VAS [place] LOWER EXTREMITY VENOUS (DVT)  Result Date: 11/25/2020  Lower Venous DVT Study Indications: Stroke, and Covid-19.  Comparison Study: No prior study on file Performing Technologist: Sharion Dove RVS  Examination Guidelines: A complete evaluation includes B-mode imaging, spectral Doppler, color Doppler, and power Doppler as needed of all accessible portions of each vessel. Bilateral testing is considered an integral part of a complete examination. Limited examinations for reoccurring indications may be performed as noted. The reflux portion of the exam is performed with the patient in reverse Trendelenburg.  +---------+---------------+---------+-----------+----------+--------------+ RIGHT    CompressibilityPhasicitySpontaneityPropertiesThrombus Aging +---------+---------------+---------+-----------+----------+--------------+ CFV      Full           Yes      Yes                                 +---------+---------------+---------+-----------+----------+--------------+ SFJ      Full                                                        +---------+---------------+---------+-----------+----------+--------------+  FV Prox  Full                                                        +---------+---------------+---------+-----------+----------+--------------+ FV Mid   Full                                                        +---------+---------------+---------+-----------+----------+--------------+ FV DistalFull                                                        +---------+---------------+---------+-----------+----------+--------------+ PFV      Full                                                        +---------+---------------+---------+-----------+----------+--------------+ POP      Full           Yes      Yes                                  +---------+---------------+---------+-----------+----------+--------------+ PTV      Full                                                        +---------+---------------+---------+-----------+----------+--------------+ PERO     None                                         Acute          +---------+---------------+---------+-----------+----------+--------------+   +---------+---------------+---------+-----------+----------+--------------+ LEFT     CompressibilityPhasicitySpontaneityPropertiesThrombus Aging +---------+---------------+---------+-----------+----------+--------------+ CFV      Full           Yes      Yes                                 +---------+---------------+---------+-----------+----------+--------------+ SFJ      Full                                                        +---------+---------------+---------+-----------+----------+--------------+ FV Prox  Full                                                        +---------+---------------+---------+-----------+----------+--------------+  FV Mid   Full                                                        +---------+---------------+---------+-----------+----------+--------------+ FV DistalFull                                                        +---------+---------------+---------+-----------+----------+--------------+ PFV      Full                                                        +---------+---------------+---------+-----------+----------+--------------+ POP      Full           Yes      Yes                                 +---------+---------------+---------+-----------+----------+--------------+ PTV      Full                                                        +---------+---------------+---------+-----------+----------+--------------+ PERO     Full                                                         +---------+---------------+---------+-----------+----------+--------------+     Summary: RIGHT: - Findings consistent with acute deep vein thrombosis involving the right peroneal veins.  LEFT: - There is no evidence of deep vein thrombosis in the lower extremity.  *See table(s) above for measurements and observations.    Preliminary    ECHOCARDIOGRAM LIMITED  Result Date: 11/25/2020    ECHOCARDIOGRAM REPORT   Patient Name:   Emily Lozano Date of Exam: 11/25/2020 Medical Rec #:  [id number]        Height:       64.0 in Accession #:    [id number]       Weight:       176.4 lb Date of Birth:  11/03/1950        BSA:          1.855 m Patient Age:    67 years         BP:           119/66 mmHg Patient Gender: F                HR:           77 bpm. Exam Location:  Inpatient Procedure: Limited Echo, Cardiac Doppler and Color Doppler Indications:    Stroke I63.9  History:        Patient has no prior history of Echocardiogram examinations.  Risk Factors:Hypertension, Diabetes and Former Smoker.  Sonographer:    Vickie Epley RDCS Referring Phys: [id number] ASHISH ARORA  Sonographer Comments: Echo performed with patient supine and on artificial respirator. Covid positive. IMPRESSIONS  1. Left ventricular ejection fraction, by estimation, is 55 to 60%. The left ventricle has normal function. The left ventricle has no regional wall motion abnormalities. Left ventricular diastolic parameters are consistent with Grade I diastolic dysfunction (impaired relaxation).  2. Right ventricular systolic function is normal. The right ventricular size is normal.  3. The mitral valve is normal in structure. Trivial mitral valve regurgitation. No evidence of mitral stenosis.  4. The aortic valve is tricuspid. Aortic valve regurgitation is trivial. Mild aortic valve sclerosis is present, with no evidence of aortic valve stenosis.  5. The inferior vena cava is normal in size with greater than 50% respiratory variability, suggesting  right atrial pressure of 3 mmHg. FINDINGS  Left Ventricle: Left ventricular ejection fraction, by estimation, is 55 to 60%. The left ventricle has normal function. The left ventricle has no regional wall motion abnormalities. The left ventricular internal cavity size was normal in size. There is  no left ventricular hypertrophy. Left ventricular diastolic parameters are consistent with Grade I diastolic dysfunction (impaired relaxation). Right Ventricle: The right ventricular size is normal.Right ventricular systolic function is normal. Left Atrium: Left atrial size was normal in size. Right Atrium: Right atrial size was normal in size. Pericardium: There is no evidence of pericardial effusion. Mitral Valve: The mitral valve is normal in structure. Trivial mitral valve regurgitation. No evidence of mitral valve stenosis. Tricuspid Valve: The tricuspid valve is normal in structure. Tricuspid valve regurgitation is trivial. No evidence of tricuspid stenosis. Aortic Valve: The aortic valve is tricuspid. Aortic valve regurgitation is trivial. Mild aortic valve sclerosis is present, with no evidence of aortic valve stenosis. Pulmonic Valve: The pulmonic valve was not well visualized. Pulmonic valve regurgitation is trivial. No evidence of pulmonic stenosis. Aorta: The aortic root is normal in size and structure. Venous: The inferior vena cava is normal in size with greater than 50% respiratory variability, suggesting right atrial pressure of 3 mmHg.  LEFT VENTRICLE PLAX 2D LVIDd:         4.10 cm     Diastology LVIDs:         3.20 cm     LV e' medial:    6.09 cm/s LV PW:         1.00 cm     LV E/e' medial:  11.1 LV IVS:        0.90 cm     LV e' lateral:   9.03 cm/s LVOT diam:     2.00 cm     LV E/e' lateral: 7.5 LV SV:         42 LV SV Index:   23 LVOT Area:     3.14 cm  LV Volumes (MOD) LV vol d, MOD A2C: 88.5 ml LV vol d, MOD A4C: 80.0 ml LV vol s, MOD A2C: 46.6 ml LV vol s, MOD A4C: 41.6 ml LV SV MOD A2C:     41.9 ml  LV SV MOD A4C:     80.0 ml LV SV MOD BP:      40.6 ml RIGHT VENTRICLE RV S prime:     13.80 cm/s TAPSE (M-mode): 1.5 cm LEFT ATRIUM         Index LA diam:    2.70 cm 1.46 cm/m  AORTIC VALVE LVOT Vmax:  71.80 cm/s LVOT Vmean:  56.700 cm/s LVOT VTI:    0.134 m  AORTA Ao Root diam: 3.70 cm MITRAL VALVE MV Area (PHT): 3.85 cm    SHUNTS MV Decel Time: 197 msec    Systemic VTI:  0.13 m MV E velocity: 67.70 cm/s  Systemic Diam: 2.00 cm MV A velocity: 81.00 cm/s MV E/A ratio:  0.84 Kirk Ruths MD Electronically signed by Kirk Ruths MD Signature Date/Time: 11/25/2020/10:54:57 AM    Final    CT ANGIO HEAD CODE STROKE  Result Date: 11/24/2020 CLINICAL DATA:  Neuro deficit, acute, stroke suspected EXAM: CT ANGIOGRAPHY HEAD AND NECK TECHNIQUE: Multidetector CT imaging of the head and neck was performed using the standard protocol during bolus administration of intravenous contrast. Multiplanar CT image reconstructions and MIPs were obtained to evaluate the vascular anatomy. Carotid stenosis measurements (when applicable) are obtained utilizing NASCET criteria, using the distal internal carotid diameter as the denominator. CONTRAST:  35mL OMNIPAQUE IOHEXOL 350 MG/ML SOLN COMPARISON:  None. FINDINGS: CTA NECK FINDINGS Aortic arch: Standard branching. Mild aortic arch atherosclerotic calcifications. Mild proximal left subclavian artery narrowing secondary to atheromatous plaque. Otherwise patent great vessel origins. Right carotid system: Patent. Minimal proximal ICA atherosclerotic calcifications without significant narrowing. Left carotid system: Patent. 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 Vertebral arteries: Mild left vertebral artery origin narrowing secondary to atheromatous disease. Otherwise patent and codominant. Skeleton: No acute finding.  Mild spondylosis. Other neck: No adenopathy.  No soft tissue mass. Upper chest: Patchy and nodular bilateral upper lung opacities concerning for  infectious/inflammatory foci. Review of the MIP images confirms the above findings CTA HEAD FINDINGS Anterior circulation: Minimal carotid siphon atherosclerotic calcifications. Patent ICAs. Patent ophthalmic artery origins. Right A1 segment hypoplasia. Patent ACAs. Patent right MCA. Left M2 segment occlusion. Posterior circulation: Patent V4 segments. Patent right PICA. Patent basilar and superior cerebellar arteries. Patent bilateral PCAs. Venous sinuses: As permitted by contrast timing, patent. Anatomic variants: Please see above. Review of the MIP images confirms the above findings IMPRESSION: CTA neck: 30-40% distal left CCA narrowing secondary to atheromatous plaque. Mild narrowing of the proximal left subclavian and left vertebral artery origin. Bilateral upper lung nodular/patchy opacities concerning for infectious/inflammatory foci. CTA head: Left M2 branch occlusion. No aneurysm or dissection. Code stroke imaging results were communicated on 11/24/2020 at 4:41 pm to provider Dr. Rory Percy via secure text paging. Electronically Signed   By: Primitivo Gauze M.D.   On: 11/24/2020 16:41   CT ANGIO NECK CODE STROKE  Result Date: 11/24/2020 CLINICAL DATA:  Neuro deficit, acute, stroke suspected EXAM: CT ANGIOGRAPHY HEAD AND NECK TECHNIQUE: Multidetector CT imaging of the head and neck was performed using the standard protocol during bolus administration of intravenous contrast. Multiplanar CT image reconstructions and MIPs were obtained to evaluate the vascular anatomy. Carotid stenosis measurements (when applicable) are obtained utilizing NASCET criteria, using the distal internal carotid diameter as the denominator. CONTRAST:  42mL OMNIPAQUE IOHEXOL 350 MG/ML SOLN COMPARISON:  None. FINDINGS: CTA NECK FINDINGS Aortic arch: Standard branching. Mild aortic arch atherosclerotic calcifications. Mild proximal left subclavian artery narrowing secondary to atheromatous plaque. Otherwise patent great vessel  origins. Right carotid system: Patent. Minimal proximal ICA atherosclerotic calcifications without significant narrowing. Left carotid system: Patent. 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 Vertebral arteries: Mild left vertebral artery origin narrowing secondary to atheromatous disease. Otherwise patent and codominant. Skeleton: No acute finding.  Mild spondylosis. Other neck: No adenopathy.  No soft tissue mass. Upper  chest: Patchy and nodular bilateral upper lung opacities concerning for infectious/inflammatory foci. Review of the MIP images confirms the above findings CTA HEAD FINDINGS Anterior circulation: Minimal carotid siphon atherosclerotic calcifications. Patent ICAs. Patent ophthalmic artery origins. Right A1 segment hypoplasia. Patent ACAs. Patent right MCA. Left M2 segment occlusion. Posterior circulation: Patent V4 segments. Patent right PICA. Patent basilar and superior cerebellar arteries. Patent bilateral PCAs. Venous sinuses: As permitted by contrast timing, patent. Anatomic variants: Please see above. Review of the MIP images confirms the above findings IMPRESSION: CTA neck: 30-40% distal left CCA narrowing secondary to atheromatous plaque. Mild narrowing of the proximal left subclavian and left vertebral artery origin. Bilateral upper lung nodular/patchy opacities concerning for infectious/inflammatory foci. CTA head: Left M2 branch occlusion. No aneurysm or dissection. Code stroke imaging results were communicated on 11/24/2020 at 4:41 pm to provider Dr. Rory Percy via secure text paging. Electronically Signed   By: Primitivo Gauze M.D.   On: 11/24/2020 16:41    PHYSICAL EXAM Blood pressure 139/64, pulse 89, temperature 97.7 F (36.5 C), temperature source Axillary, resp. rate 19, height 5\' 4"  (1.626 m), weight 80 kg, SpO2 97 %. Constitutional: Appears well-developed and well-nourished.  Psych: Unable to assess due to untubation however cooperative with examination   Eyes:Normal conjunctiva EENT: No OP obstruction, dry mucous membranes Head: Normocephalic, atraumatic no obvious abnormality Cardiovascular:Extremities warm without edema Respiratory: Effort normal, non-labored breathing GI: Soft. Non-distended.  Skin: WDI  Neuro: Mental Status: Patient is awake but likely has receptive aphasia and is not verbal. Does not follow commands. Moves her left arm and leg only. There is some evidence of right-sided neglect on examination.  Hemianopia on right . Pupils are equal, round, and reactive to light with left gaze preference but able to cross midline stimulus like light or noise.  Opens eyes to gentle sensory stimulus of face.VII: Partial right facial palsy noted with right mouth and nasolabial fold involvement.  VIII: Hearing is intact to voice X: Phonation intact XI: Head appears midline XII: Patient does not protrude tongue to command.  Motor: Tone is normal. Bulk is normal. Left upper and lower extremity 5/5 strength without pronator drift. Right upper 1-2/5 and lower extremity 3/5 without ability to resist gravity.  Sensory: Sensation to light touch is decreased on the right upper and lower extremity.  Cerebellar: Unable to assess with significant weakness on the right upper and lower extremities   ASSESSMENT/PLAN Emily Lozano is a 70 y.o. female Hx of multiple cerebrovascular risk factors presenting with sudden onset of left gaze preference, right facial droop, hemiparesis and expressive aphasia still with left hemispheric dysfunction. CTA head and neck with left M2 occlusion and though outside the window for IV TPA was a candidate for intervention now s/p unsuccessful revascularization, though there was some distal movement of the clot from M2 to M3; TIKI3. Now intubated and mildly sedated but awake not moving right side or following commands.   Stroke:  left M2 territory infarct embolic secondary to large vessel occlusion Code Stroke  CT head Acute infarct involving the left insular cortex.2. Chronic microvascular ischemic changes. ASPECTS is 9 CTA head and neck Patent ophthalmic artery origins. RightA1 segment hypoplasia. Patent ACAs. Patent right MCA. Left M2 segment occlusion. 30-40% distal left CCA narrowing secondary to atheromatous plaque. Mild narrowing of the proximal left subclavian and left vertebral artery origin. Bilateral upper lung nodular/patchy opacities concerning for infectious/inflammatory foci.  CT perfusion no core and 30cc ischemia.  MRI  Acute infarction in  the left MCA territory affecting the deep insula and left frontoparietal cortical and subcortical brain. Minimal petechial blood products or susceptibility artifact related to thrombosed vessels. 2D Echo 55 to 60%. The left ventricle has normal function. The left ventricle has no regional wall motion abnormalities, PFO or LVT.  LDL UNABLE TO CALCULATE IF TRIGLYCERIDE OVER 400 mg/dL  HgbA1c 10.6  VTE prophylaxis - SCDs    Diet   Diet NPO time specified     aspirin 81 mg daily prior to admission, now on aspirin 81 mg daily.   Therapy recommendations:  TBD  Disposition:  TBD  Hypertension  Home meds: Please refer to chart summary.  Stable . SBP goal 120-140 but gradually normalize in 5-7 days . Long-term BP goal normotensive  Hyperlipidemia  Home meds:  Rosuvastatin 10mg , resumed in hospital  LDL UNABLE TO CALCULATE IF TRIGLYCERIDE OVER 400 mg/dL, goal < 70  Continue statin at discharge  Diabetes type II uncontrolled  Home meds:  glimepiride (AMARYL) metFORMIN (GLUCOPHAGE)   HgbA1c 10.6, goal < 7.0  CBGs Recent Labs    11/25/20 0413 11/25/20 0842 11/25/20 1308  GLUCAP 171* 142* 242*      SSI  Other Stroke Risk Factors  Advanced Age >/= 69   former Cigarette smoker  ETOH use, alcohol level No results found for requested labs within last 26280 hours., advised to drink no more than 1-2 drink(s) a day  Substance  abuse - UDS:  THC No results found for requested labs within last 26280 hours., Cocaine No results found for requested labs within last 26280 hours.. Patient advised to stop using due to stroke risk.  Obesity, Body mass index is 30.27 kg/m., BMI >/= 30 associated with increased stroke risk, recommend weight loss, diet and exercise as appropriate   Other Active Problems acute deep vein thrombosis involving the right peroneal veins which is at risk for extension and PE and will need anticoagulation. Though the stroke is moderate in size and there is some risk for hemorrhage, the benefit to anticoagulation outweighs the risk of PE and recommend low intensity, no bolus heparin with close neurologic monitoring - Discussed with Dr. Shearon Stalls from [facility].  Hospital day # 1  Attempted to call family several times between ~1450-1500 at [phone] however was not able to contact them.   This patient is critically ill and at significant risk of neurological worsening, death and care requires constant monitoring of vital signs, hemodynamics,respiratory and cardiac monitoring, neurological assessment, discussion with family, other specialists and medical decision making of high complexity. I spent 45 minutes of neurocritical care time  in the care of  this patient. This was time spent independent of any time provided by nurse practitioner or PA.   Lynnae Sandhoff, MD Page: [phone]  To contact Stroke Continuity provider, please refer to [email or web address] After hours, contact General Neurology

## 2020-11-25 NOTE — Progress Notes (Signed)
Brief Nutrition Note  Consult received for enteral/tube feeding initiation and management.  Adult Enteral Nutrition Protocol initiated. Full assessment to follow.  Admitting Dx: Stroke [facility]) [I63.9] Acute ischemic stroke ([place]) [I63.9] Acute CVA (cerebrovascular accident) [facility]) [I63.9] Pneumonia due to COVID-19 virus [U07.1, J12.82]  Labs:  Recent Labs  Lab 11/24/20 1602 11/24/20 1604 11/24/20 2036 11/25/20 0608  NA 137 141 144 143  K 3.7 3.8 2.9* 2.8*  CL 105 106  --  107  CO2 19*  --   --  23  BUN 13 16  --  9  CREATININE 0.75 0.60  --  0.89  CALCIUM 8.6*  --   --  8.1*  MG  --   --   --  1.9  PHOS  --   --   --  2.7  GLUCOSE 285* 288*  --  202*    Corrin Parker, MS, RD, LDN RD pager number/after hours weekend pager number on Amion.

## 2020-11-25 NOTE — Progress Notes (Signed)
ANTICOAGULATION CONSULT NOTE - Initial Consult  Pharmacy Consult for Heparin Indication: RLE DVT, also w/ CVA  Allergies  Allergen Reactions  . Morphine And Related Nausea And Vomiting    Patient Measurements: Height: 5\' 4"  (162.6 cm) Weight: 80 kg (176 lb 5.9 oz) IBW/kg (Calculated) : 54.7 Heparin Dosing Weight: 72 kg  Vital Signs: Temp: 97.7 F (36.5 C) (02/27 1200) Temp Source: Axillary (02/27 1200) BP: 139/64 (02/27 1445) Pulse Rate: 89 (02/27 1430)  Labs: Recent Labs    11/24/20 1602 11/24/20 1604 11/24/20 2036 11/24/20 2058 11/25/20 0608 11/25/20 0932  HGB 11.7* 11.9* 10.9* 11.9*  --   --   HCT 37.5 35.0* 32.0* 40.3  --   --   PLT 403*  --   --  136*  --   --   LABPROT SPECIMEN CLOTTED  --   --   --   --   --   INR SPECIMEN CLOTTED  --   --   --   --   --   CREATININE 0.75 0.60  --   --  0.89  --   TROPONINIHS  --   --   --   --  6 5    Estimated Creatinine Clearance: 61 mL/min (by C-G formula based on SCr of 0.89 mg/dL).   Medical History: Past Medical History:  Diagnosis Date  . Anemia   . Arthritis   . 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   . Depression   . Diabetes mellitus without complication ([place])   . Hypertension   . Melanoma ([place]) 2014   resected from Left forearm.     Medications:  Infusions:  . sodium chloride 75 mL/hr at 11/25/20 1400  . clevidipine 8 mg/hr (11/25/20 1400)  . potassium chloride 10 mEq (11/25/20 1443)  . propofol (DIPRIVAN) infusion Stopped (11/25/20 1159)  . 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 COVID+ and found via dopplers on 2/27 to have a RLE DVT. Pharmacy consulted to dose Heparin for anticoagulation, no bolus and low goal with concurrent CVA.   Hep Wt: 72 kg. Hgb 11.9, plts 136 - will monitor. Hep SQ given for  VTE prophylaxis at 1320.   Goal of Therapy:  Heparin level 0.3-0.5 units/ml Monitor platelets by anticoagulation protocol: Yes   Plan:  - Start Heparin at 1000 units/hr (10 ml/hr) - Will continue to monitor for any signs/symptoms of bleeding and will follow up with heparin level in 6 hours   Thank you for allowing pharmacy to be a part of this patient's care.  Alycia Rossetti, PharmD, BCPS Clinical Pharmacist Clinical phone for 11/25/2020: [phone] 11/25/2020 2:58 PM   **Pharmacist phone directory can now be found on [email or web address] ([email or web address]).  Listed under [place].

## 2020-11-25 NOTE — Progress Notes (Signed)
Pharmacy Electrolyte Replacement  Recent Labs:  Recent Labs    11/25/20 0608  K 2.8*  MG 1.9  PHOS 2.7  CREATININE 0.89    Low Critical Values (K </= 2.5, Phos </= 1, Mg </= 1) Present: None  Plan:  - K 2.8 - 40 mEq PT x 1 and K-runs x 4 - Mg 1.9 - Mg 2g IV x 1 - F/u Mg/K with AM labs  Thank you for allowing pharmacy to be a part of this patient's care.  Alycia Rossetti, PharmD, BCPS Clinical Pharmacist Clinical phone for 11/25/2020: [phone] 11/25/2020 10:13 AM   **Pharmacist phone directory can now be found on [email or web address] ([email or web address]).  Listed under [place].

## 2020-11-25 NOTE — Progress Notes (Addendum)
Discussed with Aly Louk PA with IR BP parameters: Per Aly, standard SBP range needs to be 120-140 for first  24 hours post IR. Placed new order as previous order states SBP<220. Will start cleviprex drip, per Aly and continue to monitor.

## 2020-11-25 NOTE — Discharge Instructions (Addendum)
Follow with Primary MD Marinda Elk, MD or SNF physician  Get CBC, CMP.    Activity: As tolerated with Full fall precautions use walker/cane & assistance as needed   Disposition SNF   Diet: Dysphagia 2 with nectar thick, with feeding assistance and aspiration precautions.  Only in sitting up position with full assistance.    On your next visit with your primary care physician please Get Medicines reviewed and adjusted.   Please request your Prim.MD to go over all Hospital Tests and Procedure/Radiological results at the follow up, please get all Hospital records sent to your Prim MD by signing hospital release before you go home.   If you experience worsening of your admission symptoms, develop shortness of breath, life threatening emergency, suicidal or homicidal thoughts you must seek medical attention immediately by calling 911 or calling your MD immediately  if symptoms less severe.  You Must read complete instructions/literature along with all the possible adverse reactions/side effects for all the Medicines you take and that have been prescribed to you. Take any new Medicines after you have completely understood and accpet all the possible adverse reactions/side effects.   Do not drive, operating heavy machinery, perform activities at heights, swimming or participation in water activities or provide baby sitting services if your were admitted for syncope or siezures until you have seen by Primary MD or a Neurologist and advised to do so again.  Do not drive when taking Pain medications.    Do not take more than prescribed Pain, Sleep and Anxiety Medications  Special Instructions: If you have smoked or chewed Tobacco  in the last 2 yrs please stop smoking, stop any regular Alcohol  and or any Recreational drug use.  Wear Seat belts while driving.   Please note  You were cared for by a hospitalist during your hospital stay. If you have any questions about your  discharge medications or the care you received while you were in the hospital after you are discharged, you can call the unit and asked to speak with the hospitalist on call if the hospitalist that took care of you is not available. Once you are discharged, your primary care physician will handle any further medical issues. Please note that NO REFILLS for any discharge medications will be authorized once you are discharged, as it is imperative that you return to your primary care physician (or establish a relationship with a primary care physician if you do not have one) for your aftercare needs so that they can reassess your need for medications and monitor your lab values.

## 2020-11-25 NOTE — Progress Notes (Signed)
Assisted tele visit to patient with family member.  Maelynn Moroney M, RN  

## 2020-11-25 NOTE — Progress Notes (Addendum)
[place] for Heparin Indication: RLE DVT, also w/ CVA  Allergies  Allergen Reactions  . Morphine And Related Nausea And Vomiting    Patient Measurements: Height: 5\' 4"  (162.6 cm) Weight: 80 kg (176 lb 5.9 oz) IBW/kg (Calculated) : 54.7 Heparin Dosing Weight: 72 kg  Vital Signs: Temp: 102.4 F (39.1 C) (02/27 2249) Temp Source: Oral (02/27 2249) BP: 127/62 (02/27 2200) Pulse Rate: 95 (02/27 2249)  Labs: Recent Labs    11/24/20 1602 11/24/20 1604 11/24/20 2036 11/24/20 2058 11/25/20 0608 11/25/20 0932 11/25/20 1317 11/25/20 2212  HGB 11.7* 11.9* 10.9* 11.9*  --   --   --   --   HCT 37.5 35.0* 32.0* 40.3  --   --   --   --   PLT 403*  --   --  136*  --   --   --   --   LABPROT SPECIMEN CLOTTED  --   --   --   --   --   --   --   INR SPECIMEN CLOTTED  --   --   --   --   --   --   --   HEPARINUNFRC  --   --   --   --   --   --   --  <0.10*  CREATININE 0.75 0.60  --   --  0.89  --   --   --   TROPONINIHS  --   --   --   --  6 5 6   --     Estimated Creatinine Clearance: 61 mL/min (by C-G formula based on SCr of 0.89 mg/dL).   Medical History: Past Medical History:  Diagnosis Date  . Anemia   . Arthritis   . 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   . Depression   . Diabetes mellitus without complication ([place])   . Hypertension   . Melanoma ([place]) 2014   resected from Left forearm.     Medications:  Infusions:  . sodium chloride 75 mL/hr at 11/25/20 1900  . clevidipine 21 mg/hr (11/25/20 2216)  . heparin 1,000 Units/hr (11/25/20 1900)  . propofol (DIPRIVAN) infusion Stopped (11/25/20 1159)  . 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 COVID+ and found via dopplers on 2/27 to have a RLE DVT. Pharmacy consulted to dose Heparin for  anticoagulation, no bolus and low goal with concurrent CVA.   Hep Wt: 72 kg. Hgb 11.9, plts 136 - will monitor. Hep SQ given for VTE prophylaxis at 1320.   2/27 PM update:  Heparin level undetectable No issues per RN  Goal of Therapy:  Heparin level 0.3-0.5 units/ml Monitor platelets by anticoagulation protocol: Yes   Plan:  -Inc heparin to 1200 units/hr -Re-check heparin level in 8 hours  Narda Bonds, PharmD, [place] Pharmacist Phone: [phone]

## 2020-11-25 NOTE — Progress Notes (Signed)
  Echocardiogram 2D Echocardiogram has been performed.  Michiel Cowboy 11/25/2020, 9:47 AM

## 2020-11-25 NOTE — Progress Notes (Signed)
Axillary temp noted to be 102 F. MD Rory Percy with stroke and MD Shearon Stalls with CCM aware. Tylenol given. Will continue to monitor.

## 2020-11-25 NOTE — Progress Notes (Signed)
PT Cancellation Note  Patient Details Name: Emily Lozano MRN: [id number] DOB: 1951/07/02   Cancelled Treatment:    Reason Eval/Treat Not Completed: Medical issues which prohibited therapy;Active bedrest order. Per discussion pt with RLE acute DVT, preparing to start heparin. PT will hold until heparin levels are therapeutic. Pt also remains intubated and has active bedrest order.   Zenaida Niece 11/25/2020, 3:34 PM

## 2020-11-26 ENCOUNTER — Encounter (HOSPITAL_COMMUNITY): Payer: Self-pay | Admitting: Radiology

## 2020-11-26 DIAGNOSIS — J9601 Acute respiratory failure with hypoxia: Secondary | ICD-10-CM | POA: Diagnosis not present

## 2020-11-26 DIAGNOSIS — I639 Cerebral infarction, unspecified: Secondary | ICD-10-CM | POA: Diagnosis not present

## 2020-11-26 DIAGNOSIS — I82451 Acute embolism and thrombosis of right peroneal vein: Secondary | ICD-10-CM | POA: Diagnosis not present

## 2020-11-26 DIAGNOSIS — U071 COVID-19: Secondary | ICD-10-CM | POA: Diagnosis not present

## 2020-11-26 LAB — CBC WITH DIFFERENTIAL/PLATELET
Abs Immature Granulocytes: 0.28 10*3/uL — ABNORMAL HIGH (ref 0.00–0.07)
Basophils Absolute: 0.1 10*3/uL (ref 0.0–0.1)
Basophils Relative: 0 %
Eosinophils Absolute: 0.1 10*3/uL (ref 0.0–0.5)
Eosinophils Relative: 1 %
HCT: 35.4 % — ABNORMAL LOW (ref 36.0–46.0)
Hemoglobin: 11.3 g/dL — ABNORMAL LOW (ref 12.0–15.0)
Immature Granulocytes: 2 %
Lymphocytes Relative: 14 %
Lymphs Abs: 1.9 10*3/uL (ref 0.7–4.0)
MCH: 25.6 pg — ABNORMAL LOW (ref 26.0–34.0)
MCHC: 31.9 g/dL (ref 30.0–36.0)
MCV: 80.1 fL (ref 80.0–100.0)
Monocytes Absolute: 0.5 10*3/uL (ref 0.1–1.0)
Monocytes Relative: 4 %
Neutro Abs: 11 10*3/uL — ABNORMAL HIGH (ref 1.7–7.7)
Neutrophils Relative %: 79 %
Platelets: 576 10*3/uL — ABNORMAL HIGH (ref 150–400)
RBC: 4.42 MIL/uL (ref 3.87–5.11)
RDW: 15.6 % — ABNORMAL HIGH (ref 11.5–15.5)
WBC: 13.8 10*3/uL — ABNORMAL HIGH (ref 4.0–10.5)
nRBC: 0 % (ref 0.0–0.2)

## 2020-11-26 LAB — URINALYSIS, ROUTINE W REFLEX MICROSCOPIC
Bilirubin Urine: NEGATIVE
Glucose, UA: >=500 mg/dL — AB
Ketones, ur: 5 mg/dL — AB
Leukocytes,Ua: NEGATIVE
Nitrite: NEGATIVE
Protein, ur: NEGATIVE mg/dL
Specific Gravity, Urine: 1.006 (ref 1.005–1.030)
pH: 5 (ref 5.0–8.0)

## 2020-11-26 LAB — GLUCOSE, CAPILLARY
Glucose-Capillary: 200 mg/dL — ABNORMAL HIGH (ref 70–99)
Glucose-Capillary: 243 mg/dL — ABNORMAL HIGH (ref 70–99)
Glucose-Capillary: 256 mg/dL — ABNORMAL HIGH (ref 70–99)
Glucose-Capillary: 266 mg/dL — ABNORMAL HIGH (ref 70–99)
Glucose-Capillary: 310 mg/dL — ABNORMAL HIGH (ref 70–99)
Glucose-Capillary: 311 mg/dL — ABNORMAL HIGH (ref 70–99)
Glucose-Capillary: 355 mg/dL — ABNORMAL HIGH (ref 70–99)

## 2020-11-26 LAB — APTT
aPTT: 51 seconds — ABNORMAL HIGH (ref 24–36)
aPTT: 57 seconds — ABNORMAL HIGH (ref 24–36)

## 2020-11-26 LAB — BASIC METABOLIC PANEL
Anion gap: 15 (ref 5–15)
BUN: 10 mg/dL (ref 8–23)
CO2: 15 mmol/L — ABNORMAL LOW (ref 22–32)
Calcium: 8.1 mg/dL — ABNORMAL LOW (ref 8.9–10.3)
Chloride: 109 mmol/L (ref 98–111)
Creatinine, Ser: 0.72 mg/dL (ref 0.44–1.00)
GFR, Estimated: >60 mL/min (ref >60)
Glucose, Bld: 301 mg/dL — ABNORMAL HIGH (ref 70–99)
Potassium: 3.9 mmol/L (ref 3.5–5.1)
Sodium: 139 mmol/L (ref 135–145)

## 2020-11-26 LAB — LEGIONELLA PNEUMOPHILA SEROGP 1 UR AG: L. pneumophila Serogp 1 Ur Ag: NEGATIVE

## 2020-11-26 LAB — TRIGLYCERIDES: Triglycerides: 312 mg/dL — ABNORMAL HIGH (ref <150)

## 2020-11-26 LAB — LDL CHOLESTEROL, DIRECT: Direct LDL: 61.6 mg/dL (ref 0–99)

## 2020-11-26 LAB — HEPARIN LEVEL (UNFRACTIONATED)
Heparin Unfractionated: <0.1 IU/mL — ABNORMAL LOW (ref 0.30–0.70)
Heparin Unfractionated: 0.41 IU/mL (ref 0.30–0.70)

## 2020-11-26 LAB — MAGNESIUM: Magnesium: 2.3 mg/dL (ref 1.7–2.4)

## 2020-11-26 LAB — PHOSPHORUS: Phosphorus: 2.7 mg/dL (ref 2.5–4.6)

## 2020-11-26 MED ORDER — ORAL CARE MOUTH RINSE
15.0000 mL | OROMUCOSAL | Status: DC
  Administered 2020-11-26 – 2020-11-29 (×31): 15 mL via OROMUCOSAL

## 2020-11-26 MED ORDER — INSULIN DETEMIR 100 UNIT/ML ~~LOC~~ SOLN
8.0000 [IU] | Freq: Two times a day (BID) | SUBCUTANEOUS | Status: DC
  Filled 2020-11-26 (×4): qty 0.08

## 2020-11-26 MED ORDER — INSULIN DETEMIR 100 UNIT/ML ~~LOC~~ SOLN
12.0000 [IU] | Freq: Two times a day (BID) | SUBCUTANEOUS | Status: DC
  Administered 2020-11-26 – 2020-11-27 (×3): 12 [IU] via SUBCUTANEOUS
  Filled 2020-11-26 (×4): qty 0.12

## 2020-11-26 MED ORDER — PROSOURCE TF PO LIQD
45.0000 mL | Freq: Three times a day (TID) | ORAL | Status: DC
  Administered 2020-11-26 – 2020-11-28 (×6): 45 mL
  Filled 2020-11-26 (×7): qty 45

## 2020-11-26 MED ORDER — POTASSIUM CHLORIDE 20 MEQ PO PACK
40.0000 meq | PACK | Freq: Two times a day (BID) | ORAL | Status: AC
  Administered 2020-11-26 (×2): 40 meq
  Filled 2020-11-26 (×2): qty 2

## 2020-11-26 MED ORDER — INSULIN ASPART 100 UNIT/ML ~~LOC~~ SOLN
5.0000 [IU] | SUBCUTANEOUS | Status: DC
  Administered 2020-11-26 – 2020-11-28 (×13): 5 [IU] via SUBCUTANEOUS

## 2020-11-26 MED ORDER — FUROSEMIDE 10 MG/ML IJ SOLN
40.0000 mg | Freq: Four times a day (QID) | INTRAMUSCULAR | Status: AC
  Administered 2020-11-26 (×2): 40 mg via INTRAVENOUS
  Filled 2020-11-26 (×2): qty 4

## 2020-11-26 MED ORDER — OSMOLITE 1.5 CAL PO LIQD
1000.0000 mL | ORAL | Status: DC
  Administered 2020-11-26 – 2020-11-27 (×2): 1000 mL
  Filled 2020-11-26: qty 1000

## 2020-11-26 MED ORDER — POTASSIUM CHLORIDE CRYS ER 20 MEQ PO TBCR
40.0000 meq | EXTENDED_RELEASE_TABLET | Freq: Two times a day (BID) | ORAL | Status: DC
  Filled 2020-11-26: qty 2

## 2020-11-26 MED ORDER — HYDRALAZINE HCL 20 MG/ML IJ SOLN
10.0000 mg | Freq: Four times a day (QID) | INTRAMUSCULAR | Status: DC | PRN
  Administered 2020-11-26 – 2020-11-27 (×2): 10 mg via INTRAVENOUS
  Filled 2020-11-26 (×2): qty 1

## 2020-11-26 MED ORDER — PANTOPRAZOLE SODIUM 40 MG PO PACK
40.0000 mg | PACK | Freq: Every day | ORAL | Status: DC
  Administered 2020-11-26 – 2020-11-27 (×2): 40 mg
  Filled 2020-11-26 (×3): qty 20

## 2020-11-26 MED ORDER — DEXAMETHASONE 4 MG PO TABS
6.0000 mg | ORAL_TABLET | Freq: Every day | ORAL | Status: DC
  Administered 2020-11-26 – 2020-11-28 (×3): 6 mg
  Filled 2020-11-26 (×3): qty 2

## 2020-11-26 NOTE — Progress Notes (Signed)
OT Cancellation Note  Patient Details Name: Emily Lozano MRN: [id number] DOB: 03-Dec-1950   Cancelled Treatment:    Reason Eval/Treat Not Completed: Active bedrest order/ intubated OT order received and appreciated however this conflicts with current bedrest order set. Please increase activity tolerance as appropriate and remove bedrest from orders. . Please contact OT at [phone] if bed rest order is discontinued. OT will hold evaluation at this time and will check back as time allows pending increased activity orders.   Billey Chang, OTR/L  Acute Rehabilitation Services Pager: [phone] Office: [phone] .  11/26/2020, 8:24 AM

## 2020-11-26 NOTE — Progress Notes (Deleted)
Referring Physician(s): Dr. Lourena Simmonds - Code Stroke  Supervising Physician: Pedro Earls  Patient Status:  [facility] - In-pt  Chief Complaint:  Stroke F/U  Subjective: 70 y.o. female inpatient. History of HTN, HLD, DM, Melanoma, Recent exposure of COVID positive upon admission. Presented to the ED at [facility] on 2.26.22 with a side onset of right sided hemiparesis, aphasia and left sided gaze. NIHSS 17. CT Angio showed acute CVA s/p cerebral arteriogram showed a left M2 segment occlusion s/p  thrombectomy of left MCA, M2 occlusion achieving TICI 2b revasculation via right femoral approach on 2.26.22 by Dr. Dr Sindy Messing.  Patient laying in bed, intubated but alert to verbal stimuli. She is able to follow simple commands. Minimal movement noted to right upper extremity and no movement in the right lower extremity.    She does not follow simple commands. Right femoral puncture site clean dry and intact.   Allergies: Morphine and related  Medications: Prior to Admission medications   Medication Sig Start Date End Date Taking? Authorizing Provider  amLODipine (NORVASC) 5 MG tablet Take 5 mg by mouth daily. 10/23/20  Yes [provider]  aspirin EC 81 MG tablet Take 81 mg by mouth daily.   Yes [provider]  buPROPion (WELLBUTRIN XL) 300 MG 24 hr tablet Take 300 mg by mouth daily.   Yes [provider]  Dextromethorphan-guaiFENesin (ROBITUSSIN COUGH+CHEST CONG DM) 20-200 MG/20ML LIQD Take 20 mLs by mouth 2 (two) times daily as needed (cough/congestion).   Yes [provider]  dicyclomine (BENTYL) 10 MG capsule Take 10 mg by mouth 3 (three) times daily as needed (diarrhea).   Yes [provider]  DM-APAP-CPM (CORICIDIN HBP PO) Take 1 tablet by mouth 2 (two) times daily as needed (cough congestion).   Yes [provider]  glimepiride (AMARYL) 4 MG tablet Take 4 mg by mouth See admin instructions. Take one tablet (4 mg) by  mouth every morning, take an additional tablet (4 mg) for CBG 250-300 10/23/20  Yes [provider]  hydroxychloroquine (PLAQUENIL) 200 MG tablet Take 200-400 mg by mouth See admin instructions. Take 2 tablets (400 mg) by mouth 1st day, then take 1 tablet (200 mg) 2nd day, then repeat alternating days   Yes [provider]  losartan-hydrochlorothiazide (HYZAAR) 100-12.5 MG tablet Take 1 tablet by mouth daily.   Yes [provider]  metFORMIN (GLUCOPHAGE) 500 MG tablet Take 500 mg by mouth 2 (two) times daily with a meal.   Yes [provider]  metoprolol succinate (TOPROL-XL) 100 MG 24 hr tablet Take 100 mg by mouth daily. Take with or immediately following a meal.   Yes [provider]  naproxen sodium (ALEVE) 220 MG tablet Take 220 mg by mouth 2 (two) times daily as needed (pain/headache).   Yes [provider]  pantoprazole (PROTONIX) 40 MG tablet Take 40 mg by mouth 2 (two) times daily.   Yes [provider]  rosuvastatin (CRESTOR) 10 MG tablet Take 10 mg by mouth daily.   Yes [provider]  Ascorbic Acid (VITAMIN C) 1000 MG tablet Take 1,000 mg by mouth daily.    [provider]  azithromycin (ZITHROMAX) 250 MG tablet Take 250-500 mg by mouth See admin instructions. Take 2 tablets (500 mg) by mouth 1st day, then take 1 tablet (250 mg) daily on days 2-5 11/23/20   [provider]  benzonatate (TESSALON) 200 MG capsule Take 200 mg by mouth 3 (three)  times daily as needed for cough. 11/23/20   [provider]  predniSONE (DELTASONE) 20 MG tablet Take 20 mg by mouth daily. 11/23/20   [provider]  Zinc 50 MG TABS Take 50 mg by mouth daily.    [provider]     Vital Signs: BP 129/61   Pulse 92   Temp 98.1 F (36.7 C) (Axillary)   Resp (!) 24   Ht 5\' 4"  (1.626 m)   Wt 156 lb 15.5 oz (71.2 kg)   SpO2 99%   BMI 26.94 kg/m   Physical Exam Vitals and nursing note reviewed.   Constitutional:      Appearance: She is well-developed and well-nourished.  HENT:     Head: Normocephalic and atraumatic.  Eyes:     Pupils: Pupils are equal, round, and reactive to light.  Cardiovascular:     Rate and Rhythm: Normal rate.  Pulmonary:     Comments: Patient intubated. Musculoskeletal:     Cervical back: Normal range of motion.  Neurological:     Mental Status: She is alert.     Comments: Alert to verbal stimuli. Able to folllow simple commands. PERRL bilaterally. Flicker of movement to right upper extremity. No spontaneous movement of right lower dexterity.  Distal pulses (DPs) 1+ bilaterally.  Right femoral access site c/d/i  Psychiatric:        Mood and Affect: Mood and affect normal.     Imaging: MR BRAIN WO CONTRAST  Result Date: 11/25/2020 CLINICAL DATA:  Acute stroke presentation 11/24/2020. Left insular infarction. EXAM: MRI HEAD WITHOUT CONTRAST TECHNIQUE: Multiplanar, multiecho pulse sequences of the brain and surrounding structures were obtained without intravenous contrast. COMPARISON:  CT and interventional studies over the last day. FINDINGS: Brain: Diffusion imaging shows acute infarction in the deep insula and left frontoparietal cortical and subcortical brain. Mild swelling in the region. There are either minimal petechial blood products or there is susceptibility artifact related to thrombosed vessels. No frank hematoma. No mass effect or shift. Elsewhere, the brainstem and cerebellum are unremarkable. Both cerebral hemispheres show moderate to severe chronic small-vessel ischemic changes throughout the white matter. No hydrocephalus. No extra-axial collection. Vascular: Major vessels at base of the brain show flow. Skull and upper cervical spine: Negative Sinuses/Orbits: Mucosal inflammatory changes of paranasal sinuses. Orbits negative. Other: None IMPRESSION: 1. Acute infarction in the left MCA territory affecting the deep insula and left  frontoparietal cortical and subcortical brain. Minimal petechial blood products or susceptibility artifact related to thrombosed vessels. No frank hematoma. No mass effect or shift. 2. Moderate to severe chronic small-vessel ischemic changes elsewhere throughout the cerebral hemispheric white matter. Electronically Signed   By: Nelson Chimes M.D.   On: 11/25/2020 02:18   CT CEREBRAL PERFUSION W CONTRAST  Result Date: 11/24/2020 CLINICAL DATA:  Neuro deficit, acute, stroke suspected EXAM: CT PERFUSION BRAIN TECHNIQUE: Multiphase CT imaging of the brain was performed following IV bolus contrast injection. Subsequent parametric perfusion maps were calculated using RAPID software. CONTRAST:  30mL OMNIPAQUE IOHEXOL 350 MG/ML SOLN COMPARISON:  Concurrent CTA head and neck. FINDINGS: CT Brain Perfusion Findings: CBF (<30%) Volume: 56mL Perfusion (Tmax>6.0s) volume: 41mL Mismatch Volume: 68mL ASPECTS on noncontrast CT Head: 9 at 16:42 today. Infarct Core: 0 mL Infarction Location:Not applicable. IMPRESSION: Perfusion imaging demonstrates no infarct core. 30 mL region of ischemia involving the left MCA territory. Electronically Signed   By: Primitivo Gauze M.D.   On: 11/24/2020 16:55   DG Chest [facility]  Result Date: 11/24/2020 CLINICAL DATA:  COVID positive EXAM: PORTABLE CHEST 1 VIEW COMPARISON:  None. FINDINGS: The heart size and mediastinal contours are mildly enlarged. ETT is 4 cm above the carina. NG tube is seen below the diaphragm. Aortic knob calcifications are seen. There is hazy patchy airspace opacity seen at the periphery of the right lower lung and at the left lung base. No pleural effusion. IMPRESSION: ETT and NG tube in satisfactory position Bilateral multifocal airspace opacities, consistent with multifocal pneumonia Electronically Signed   By: Prudencio Pair M.D.   On: 11/24/2020 22:14   CT HEAD CODE STROKE WO CONTRAST  Result Date: 11/24/2020 CLINICAL DATA:  Code stroke.  Neuro deficit,  acute, stroke suspected EXAM: CT HEAD WITHOUT CONTRAST TECHNIQUE: Contiguous axial images were obtained from the base of the skull through the vertex without intravenous contrast. COMPARISON:  06/27/2020. FINDINGS: Brain: No intracranial hemorrhage. New focal hypodensity involving the left insula. No mass lesion. No midline shift, ventriculomegaly or extra-axial fluid collection. Chronic microvascular ischemic changes. Vascular: No hyperdense vessel or unexpected calcification. Minimal carotid siphon atherosclerotic calcifications. Skull: Negative for fracture or focal lesion. Sinuses/Orbits: No acute finding.  Mild pansinus mucosal thickening. Other: Right parietal scalp nodule is unchanged. ASPECTS [facility] Stroke Program Early CT Score) - Ganglionic level infarction (caudate, lentiform nuclei, internal capsule, insula, M1-M3 cortex): 6 - Supraganglionic infarction (M4-M6 cortex): 3 Total score (0-10 with 10 being normal): 9 IMPRESSION: 1. 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 2. Chronic microvascular ischemic changes. 3. ASPECTS is 9 Code stroke imaging results were communicated on 11/24/2020 at 4:23 pm to provider Dr. Rory Percy via secure text paging. Electronically Signed   By: Primitivo Gauze M.D.   On: 11/24/2020 16:24   VAS [place] LOWER EXTREMITY VENOUS (DVT)  Result Date: 11/25/2020  Lower Venous DVT Study Indications: Stroke, and Covid-19.  Comparison Study: No prior study on file Performing Technologist: Sharion Dove RVS  Examination Guidelines: A complete evaluation includes B-mode imaging, spectral Doppler, color Doppler, and power Doppler as needed of all accessible portions of each vessel. Bilateral testing is considered an integral part of a complete examination. Limited examinations for reoccurring indications may be performed as noted. The reflux portion of the exam is performed with the patient in reverse Trendelenburg.   +---------+---------------+---------+-----------+----------+--------------+ RIGHT    CompressibilityPhasicitySpontaneityPropertiesThrombus Aging +---------+---------------+---------+-----------+----------+--------------+ CFV      Full           Yes      Yes                                 +---------+---------------+---------+-----------+----------+--------------+ SFJ      Full                                                        +---------+---------------+---------+-----------+----------+--------------+ FV Prox  Full                                                        +---------+---------------+---------+-----------+----------+--------------+ FV Mid   Full                                                        +---------+---------------+---------+-----------+----------+--------------+  FV DistalFull                                                        +---------+---------------+---------+-----------+----------+--------------+ PFV      Full                                                        +---------+---------------+---------+-----------+----------+--------------+ POP      Full           Yes      Yes                                 +---------+---------------+---------+-----------+----------+--------------+ PTV      Full                                                        +---------+---------------+---------+-----------+----------+--------------+ PERO     None                                         Acute          +---------+---------------+---------+-----------+----------+--------------+   +---------+---------------+---------+-----------+----------+--------------+ LEFT     CompressibilityPhasicitySpontaneityPropertiesThrombus Aging +---------+---------------+---------+-----------+----------+--------------+ CFV      Full           Yes      Yes                                  +---------+---------------+---------+-----------+----------+--------------+ SFJ      Full                                                        +---------+---------------+---------+-----------+----------+--------------+ FV Prox  Full                                                        +---------+---------------+---------+-----------+----------+--------------+ FV Mid   Full                                                        +---------+---------------+---------+-----------+----------+--------------+ FV DistalFull                                                        +---------+---------------+---------+-----------+----------+--------------+  PFV      Full                                                        +---------+---------------+---------+-----------+----------+--------------+ POP      Full           Yes      Yes                                 +---------+---------------+---------+-----------+----------+--------------+ PTV      Full                                                        +---------+---------------+---------+-----------+----------+--------------+ PERO     Full                                                        +---------+---------------+---------+-----------+----------+--------------+     Summary: RIGHT: - Findings consistent with acute deep vein thrombosis involving the right peroneal veins.  LEFT: - There is no evidence of deep vein thrombosis in the lower extremity.  *See table(s) above for measurements and observations.    Preliminary    ECHOCARDIOGRAM LIMITED  Result Date: 11/25/2020    ECHOCARDIOGRAM REPORT   Patient Name:   JAQUASIA DOSCHER Date of Exam: 11/25/2020 Medical Rec #:  [id number]        Height:       64.0 in Accession #:    [id number]       Weight:       176.4 lb Date of Birth:  June 01, 1951        BSA:          1.855 m Patient Age:    3 years         BP:           119/66 mmHg Patient Gender: F                 HR:           77 bpm. Exam Location:  Inpatient Procedure: Limited Echo, Cardiac Doppler and Color Doppler Indications:    Stroke I63.9  History:        Patient has no prior history of Echocardiogram examinations.                 Risk Factors:Hypertension, Diabetes and Former Smoker.  Sonographer:    Vickie Epley RDCS Referring Phys: [id number] ASHISH ARORA  Sonographer Comments: Echo performed with patient supine and on artificial respirator. Covid positive. IMPRESSIONS  1. Left ventricular ejection fraction, by estimation, is 55 to 60%. The left ventricle has normal function. The left ventricle has no regional wall motion abnormalities. Left ventricular diastolic parameters are consistent with Grade I diastolic dysfunction (impaired relaxation).  2. Right ventricular systolic function is normal. The right ventricular size is normal.  3. The mitral valve is normal in structure. Trivial mitral valve regurgitation. No evidence of mitral  stenosis.  4. The aortic valve is tricuspid. Aortic valve regurgitation is trivial. Mild aortic valve sclerosis is present, with no evidence of aortic valve stenosis.  5. The inferior vena cava is normal in size with greater than 50% respiratory variability, suggesting right atrial pressure of 3 mmHg. FINDINGS  Left Ventricle: Left ventricular ejection fraction, by estimation, is 55 to 60%. The left ventricle has normal function. The left ventricle has no regional wall motion abnormalities. The left ventricular internal cavity size was normal in size. There is  no left ventricular hypertrophy. Left ventricular diastolic parameters are consistent with Grade I diastolic dysfunction (impaired relaxation). Right Ventricle: The right ventricular size is normal.Right ventricular systolic function is normal. Left Atrium: Left atrial size was normal in size. Right Atrium: Right atrial size was normal in size. Pericardium: There is no evidence of pericardial effusion. Mitral Valve: The mitral  valve is normal in structure. Trivial mitral valve regurgitation. No evidence of mitral valve stenosis. Tricuspid Valve: The tricuspid valve is normal in structure. Tricuspid valve regurgitation is trivial. No evidence of tricuspid stenosis. Aortic Valve: The aortic valve is tricuspid. Aortic valve regurgitation is trivial. Mild aortic valve sclerosis is present, with no evidence of aortic valve stenosis. Pulmonic Valve: The pulmonic valve was not well visualized. Pulmonic valve regurgitation is trivial. No evidence of pulmonic stenosis. Aorta: The aortic root is normal in size and structure. Venous: The inferior vena cava is normal in size with greater than 50% respiratory variability, suggesting right atrial pressure of 3 mmHg.  LEFT VENTRICLE PLAX 2D LVIDd:         4.10 cm     Diastology LVIDs:         3.20 cm     LV e' medial:    6.09 cm/s LV PW:         1.00 cm     LV E/e' medial:  11.1 LV IVS:        0.90 cm     LV e' lateral:   9.03 cm/s LVOT diam:     2.00 cm     LV E/e' lateral: 7.5 LV SV:         42 LV SV Index:   23 LVOT Area:     3.14 cm  LV Volumes (MOD) LV vol d, MOD A2C: 88.5 ml LV vol d, MOD A4C: 80.0 ml LV vol s, MOD A2C: 46.6 ml LV vol s, MOD A4C: 41.6 ml LV SV MOD A2C:     41.9 ml LV SV MOD A4C:     80.0 ml LV SV MOD BP:      40.6 ml RIGHT VENTRICLE RV S prime:     13.80 cm/s TAPSE (M-mode): 1.5 cm LEFT ATRIUM         Index LA diam:    2.70 cm 1.46 cm/m  AORTIC VALVE LVOT Vmax:   71.80 cm/s LVOT Vmean:  56.700 cm/s LVOT VTI:    0.134 m  AORTA Ao Root diam: 3.70 cm MITRAL VALVE MV Area (PHT): 3.85 cm    SHUNTS MV Decel Time: 197 msec    Systemic VTI:  0.13 m MV E velocity: 67.70 cm/s  Systemic Diam: 2.00 cm MV A velocity: 81.00 cm/s MV E/A ratio:  0.84 Kirk Ruths MD Electronically signed by Kirk Ruths MD Signature Date/Time: 11/25/2020/10:54:57 AM    Final    CT ANGIO HEAD CODE STROKE  Result Date: 11/24/2020 CLINICAL DATA:  Neuro deficit, acute, stroke suspected EXAM: CT ANGIOGRAPHY  HEAD AND NECK TECHNIQUE: Multidetector CT imaging of the head and neck was performed using the standard protocol during bolus administration of intravenous contrast. Multiplanar CT image reconstructions and MIPs were obtained to evaluate the vascular anatomy. Carotid stenosis measurements (when applicable) are obtained utilizing NASCET criteria, using the distal internal carotid diameter as the denominator. CONTRAST:  15mL OMNIPAQUE IOHEXOL 350 MG/ML SOLN COMPARISON:  None. FINDINGS: CTA NECK FINDINGS Aortic arch: Standard branching. Mild aortic arch atherosclerotic calcifications. Mild proximal left subclavian artery narrowing secondary to atheromatous plaque. Otherwise patent great vessel origins. Right carotid system: Patent. Minimal proximal ICA atherosclerotic calcifications without significant narrowing. Left carotid system: Patent. 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 Vertebral arteries: Mild left vertebral artery origin narrowing secondary to atheromatous disease. Otherwise patent and codominant. Skeleton: No acute finding.  Mild spondylosis. Other neck: No adenopathy.  No soft tissue mass. Upper chest: Patchy and nodular bilateral upper lung opacities concerning for infectious/inflammatory foci. Review of the MIP images confirms the above findings CTA HEAD FINDINGS Anterior circulation: Minimal carotid siphon atherosclerotic calcifications. Patent ICAs. Patent ophthalmic artery origins. Right A1 segment hypoplasia. Patent ACAs. Patent right MCA. Left M2 segment occlusion. Posterior circulation: Patent V4 segments. Patent right PICA. Patent basilar and superior cerebellar arteries. Patent bilateral PCAs. Venous sinuses: As permitted by contrast timing, patent. Anatomic variants: Please see above. Review of the MIP images confirms the above findings IMPRESSION: CTA neck: 30-40% distal left CCA narrowing secondary to atheromatous plaque. Mild narrowing of the proximal left subclavian  and left vertebral artery origin. Bilateral upper lung nodular/patchy opacities concerning for infectious/inflammatory foci. CTA head: Left M2 branch occlusion. No aneurysm or dissection. Code stroke imaging results were communicated on 11/24/2020 at 4:41 pm to provider Dr. Rory Percy via secure text paging. Electronically Signed   By: Primitivo Gauze M.D.   On: 11/24/2020 16:41   CT ANGIO NECK CODE STROKE  Result Date: 11/24/2020 CLINICAL DATA:  Neuro deficit, acute, stroke suspected EXAM: CT ANGIOGRAPHY HEAD AND NECK TECHNIQUE: Multidetector CT imaging of the head and neck was performed using the standard protocol during bolus administration of intravenous contrast. Multiplanar CT image reconstructions and MIPs were obtained to evaluate the vascular anatomy. Carotid stenosis measurements (when applicable) are obtained utilizing NASCET criteria, using the distal internal carotid diameter as the denominator. CONTRAST:  71mL OMNIPAQUE IOHEXOL 350 MG/ML SOLN COMPARISON:  None. FINDINGS: CTA NECK FINDINGS Aortic arch: Standard branching. Mild aortic arch atherosclerotic calcifications. Mild proximal left subclavian artery narrowing secondary to atheromatous plaque. Otherwise patent great vessel origins. Right carotid system: Patent. Minimal proximal ICA atherosclerotic calcifications without significant narrowing. Left carotid system: Patent. 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 Vertebral arteries: Mild left vertebral artery origin narrowing secondary to atheromatous disease. Otherwise patent and codominant. Skeleton: No acute finding.  Mild spondylosis. Other neck: No adenopathy.  No soft tissue mass. Upper chest: Patchy and nodular bilateral upper lung opacities concerning for infectious/inflammatory foci. Review of the MIP images confirms the above findings CTA HEAD FINDINGS Anterior circulation: Minimal carotid siphon atherosclerotic calcifications. Patent ICAs. Patent ophthalmic artery  origins. Right A1 segment hypoplasia. Patent ACAs. Patent right MCA. Left M2 segment occlusion. Posterior circulation: Patent V4 segments. Patent right PICA. Patent basilar and superior cerebellar arteries. Patent bilateral PCAs. Venous sinuses: As permitted by contrast timing, patent. Anatomic variants: Please see above. Review of the MIP images confirms the above findings IMPRESSION: CTA neck: 30-40% distal left CCA narrowing secondary to atheromatous plaque. Mild narrowing of the  proximal left subclavian and left vertebral artery origin. Bilateral upper lung nodular/patchy opacities concerning for infectious/inflammatory foci. CTA head: Left M2 branch occlusion. No aneurysm or dissection. Code stroke imaging results were communicated on 11/24/2020 at 4:41 pm to provider Dr. Rory Percy via secure text paging. Electronically Signed   By: Primitivo Gauze M.D.   On: 11/24/2020 16:41    Labs:  CBC: Recent Labs    11/24/20 1602 11/24/20 1604 11/24/20 2036 11/24/20 2058  WBC 5.5  --   --  5.3  HGB 11.7* 11.9* 10.9* 11.9*  HCT 37.5 35.0* 32.0* 40.3  PLT 403*  --   --  136*    COAGS: Recent Labs    11/24/20 1602  INR SPECIMEN CLOTTED    BMP: Recent Labs    11/24/20 1602 11/24/20 1604 11/24/20 2036 11/25/20 0608 11/26/20 0413  NA 137 141 144 143 139  K 3.7 3.8 2.9* 2.8* 3.9  CL 105 106  --  107 109  CO2 19*  --   --  23 15*  GLUCOSE 285* 288*  --  202* 301*  BUN 13 16  --  9 10  CALCIUM 8.6*  --   --  8.1* 8.1*  CREATININE 0.75 0.60  --  0.89 0.72  GFRNONAA >60  --   --  >60 >60    LIVER FUNCTION TESTS: Recent Labs    11/24/20 1602 11/25/20 0608  BILITOT 0.7 <0.1*  AST 18 17  ALT 13 14  ALKPHOS 55 55  PROT 6.9 6.3*  ALBUMIN 2.9* 2.5*    Assessment and Plan: 70 y.o. female inpatient. History of HTN, HLD, DM, Melanoma, Recent exposure of COVID positive upon admission. Presented to the ED at [facility] on 2.26.22 with a side onset of right sided hemiparesis, aphasia and left  sided gaze. NIHSS 17. CT Angio showed acute CVA s/p cerebral arteriogram showed a left M2 segment occlusion s/p  thrombectomy of left MCA, M2 occlusion achieving TICI 2b revasculation via right femoral approach on 2.26.22 by Dr. Dr Sindy Messing   Electronically Signed: Jacqualine Mau, NP 11/26/2020, 8:28 AM   I spent a total of 15 Minutes at the patient's bedside AND on the patient's hospital floor or unit, greater than 50% of which was counseling/coordinating care for cerebral arteriogram with intervention

## 2020-11-26 NOTE — Progress Notes (Signed)
Inpatient Diabetes Program Recommendations  AACE/ADA: New Consensus Statement on Inpatient Glycemic Control (2015)  Target Ranges:  Prepandial:   less than 140 mg/dL      Peak postprandial:   less than 180 mg/dL (1-2 hours)      Critically ill patients:  140 - 180 mg/dL   Lab Results  Component Value Date   GLUCAP 311 (H) 11/26/2020   HGBA1C 10.6 (H) 11/25/2020    Review of Glycemic Control Results for Emily Lozano, Emily Lozano (MRN [id number]) as of 11/26/2020 08:27  Ref. Range 11/25/2020 19:29 11/26/2020 00:23 11/26/2020 04:20 11/26/2020 08:04  Glucose-Capillary Latest Ref Range: 70 - 99 mg/dL 260 (H) 243 (H) 266 (H) 311 (H)   Diabetes history: DM 2  Outpatient Diabetes medications:  Amaryl 4 mg daily Metformin 500 mg bid Current orders for Inpatient glycemic control:  Vital 40 cc/hr Novolog resistant q 4 hours Inpatient Diabetes Program Recommendations:    Please add Levemir 8 units bid. If CBG's continue to be > 200 mg/dL, consider adding Novolog 2 units q 4 hours to cover tube feeds.   Thanks,  Adah Perl, RN, BC-ADM Inpatient Diabetes Coordinator Pager [phone] (8a-5p)

## 2020-11-26 NOTE — Evaluation (Signed)
Physical Therapy Evaluation Patient Details Name: Emily Lozano MRN: [id number] DOB: 05-Jun-1951 Today's Date: 11/26/2020   History of Present Illness  70 y.o. female with a medical history significant for hypertension, hyperlipidemia, type 2 diabetes mellitus, depression, GERD, melanoma, sleep apnea, and recent exposure to COVID-19 with a negative test at her PCP yesterday but diagnosed with acute bronchitis. Pt presents with acute onset R weakness, aphasia, left gaze. CTA demonstrates L M2 occlusion. Pt underwent thrombectomy on 11/24/2020 (unsuccessful)  Clinical Impression  Pt admitted with/for s/s of stroke including R weakness, aphasia and left gaze preference.  Thrombectomy in IR was unsuccessful.  Pt has Covid and remains on the vent.  Mobility was limited and pt needed max to total assist of 1-2 persons..  Pt currently limited functionally due to the problems listed. ( See problems list.)   Pt will benefit from PT to maximize function and safety in order to get ready for next venue listed below.     Follow Up Recommendations CIR;Supervision/Assistance - 24 hour    Equipment Recommendations  Other (comment) (TBA)    Recommendations for Other Services Rehab consult     Precautions / Restrictions Precautions Precautions: Fall;Other (comment) Precaution Comments: COVID postive      Mobility  Bed Mobility Overal bed mobility: Needs Assistance Bed Mobility: Supine to Sit;Sit to Supine     Supine to sit: Max assist;Total assist;+2 for physical assistance Sit to supine: Max assist;+2 for physical assistance   General bed mobility comments: Pt too lethargic initially to assist significant to EOB, but pt intiated laying back down in the bed, by intentionally pulling to the left toward the pillow.    Transfers                 General transfer comment: NT today.  Ambulation/Gait             General Gait Details: NT  Stairs            Wheelchair  Mobility    Modified Rankin (Stroke Patients Only) Modified Rankin (Stroke Patients Only) Pre-Morbid Rankin Score: No symptoms Modified Rankin: Severe disability     Balance Overall balance assessment: Needs assistance Sitting-balance support: Feet supported;Single extremity supported Sitting balance-Leahy Scale: Poor Sitting balance - Comments: pt listed R and posteriorly unless assist to hold midline.                                     Pertinent Vitals/Pain Pain Assessment: Faces Faces Pain Scale: No hurt    Home Living Family/patient expects to be discharged to:: Private residence                 Additional Comments: Pt unable to provide due to aphasia and intubation. RN reports pt has son who visited earlier    Prior Function           Comments: Pt unable to provide information about PLOF     Hand Dominance        Extremity/Trunk Assessment   Upper Extremity Assessment Upper Extremity Assessment: Defer to OT evaluation RUE Deficits / Details: Flaccid. Slight responce to pain    Lower Extremity Assessment Lower Extremity Assessment: RLE deficits/detail RLE Deficits / Details: low tone if not flaccid       Communication   Communication: Expressive difficulties  Cognition Arousal/Alertness: Awake/alert Behavior During Therapy: WFL for tasks assessed/performed Overall Cognitive Status: Difficult to  assess                                 General Comments: Pt follow cues to open her eyes. Not following simple commands.      General Comments General comments (skin integrity, edema, etc.): Sats maintained in the upper 90's on ventilator at 40% FiO2    Exercises     Assessment/Plan    PT Assessment Patient needs continued PT services  PT Problem List Decreased strength;Decreased activity tolerance;Decreased balance;Decreased mobility;Decreased coordination;Decreased cognition;Decreased knowledge of  precautions;Cardiopulmonary status limiting activity       PT Treatment Interventions Functional mobility training;Therapeutic activities;Balance training;Neuromuscular re-education;Patient/family education;Gait training;DME instruction    PT Goals (Current goals can be found in the Care Plan section)  Acute Rehab PT Goals Patient Stated Goal: pt unable PT Goal Formulation: Patient unable to participate in goal setting Time For Goal Achievement: 12/10/20 Potential to Achieve Goals: Fair    Frequency Min 3X/week   Barriers to discharge        Co-evaluation PT/OT/SLP Co-Evaluation/Treatment: Yes Reason for Co-Treatment: Complexity of the patient's impairments (multi-system involvement);For patient/therapist safety PT goals addressed during session: Mobility/safety with mobility OT goals addressed during session: ADL's and self-care       AM-PAC PT "6 Clicks" Mobility  Outcome Measure Help needed turning from your back to your side while in a flat bed without using bedrails?: Total Help needed moving from lying on your back to sitting on the side of a flat bed without using bedrails?: Total Help needed moving to and from a bed to a chair (including a wheelchair)?: Total Help needed standing up from a chair using your arms (e.g., wheelchair or bedside chair)?: Total Help needed to walk in hospital room?: Total Help needed climbing 3-5 steps with a railing? : Total 6 Click Score: 6    End of Session   Activity Tolerance: Patient limited by fatigue;Patient tolerated treatment well Patient left: in bed;with call bell/phone within reach;with bed alarm set;with nursing/sitter in room;with SCD's reapplied Nurse Communication: Mobility status PT Visit Diagnosis: Hemiplegia and hemiparesis;Other abnormalities of gait and mobility (R26.89) Hemiplegia - Right/Left: Right Hemiplegia - dominant/non-dominant: Dominant Hemiplegia - caused by: Cerebral infarction    Time: 1451-1520 PT  Time Calculation (min) (ACUTE ONLY): 29 min   Charges:   PT Evaluation $PT Eval High Complexity: 1 High          11/26/2020  Emily Carne., PT Acute Rehabilitation Services [phone]  (pager) [phone]  (office)  Emily Lozano 11/26/2020, 5:12 PM

## 2020-11-26 NOTE — Progress Notes (Signed)
[facility] for Heparin Indication: RLE DVT  Allergies  Allergen Reactions  . Morphine And Related Nausea And Vomiting    Patient Measurements: Height: 5\' 4"  (162.6 cm) Weight: 71.2 kg (156 lb 15.5 oz) IBW/kg (Calculated) : 54.7 Heparin Dosing Weight: 72 kg  Vital Signs: Temp: 99.8 F (37.7 C) (02/28 1200) BP: 153/94 (02/28 2200) Pulse Rate: 93 (02/28 2200)  Labs: Recent Labs    11/24/20 1602 11/24/20 1604 11/24/20 2036 11/24/20 2058 11/25/20 0608 11/25/20 0932 11/25/20 1317 11/25/20 2212 11/26/20 0413 11/26/20 0834 11/26/20 1051 11/26/20 2034  HGB 11.7* 11.9* 10.9* 11.9*  --   --   --   --   --   --  11.3*  --   HCT 37.5 35.0* 32.0* 40.3  --   --   --   --   --   --  35.4*  --   PLT 403*  --   --  136*  --   --   --   --   --   --  576*  --   APTT  --   --   --   --   --   --   --   --   --   --  51* 57*  LABPROT SPECIMEN CLOTTED  --   --   --   --   --   --   --   --   --   --   --   INR SPECIMEN CLOTTED  --   --   --   --   --   --   --   --   --   --   --   HEPARINUNFRC  --   --   --   --   --   --   --  <0.10*  --  <0.10*  --  0.41  CREATININE 0.75 0.60  --   --  0.89  --   --   --  0.72  --   --   --   TROPONINIHS  --   --   --   --  6 5 6   --   --   --   --   --     Estimated Creatinine Clearance: 64.2 mL/min (by C-G formula based on SCr of 0.72 mg/dL).   Assessment: 66 YOF who presented on 2/26 with R-hemiparesis, L-gaze, and aphasia found to have L-M2 branch occlusion, now s/p thrombectomy on 2/26 by neuro-IR. COVID+ and found via dopplers on 2/27 to have a RLE DVT. Pharmacy consulted to dose IV heparin for anticoagulation, no bolus and low goal with concurrent CVA.   Lab unable to process heparin level because blood contains too much lipid.  Currently off Cleviprex and Propofol.  APTT sub-therapeutic at 51 sec; no issue with heparin infusion nor bleeding per RN.  Goal of Therapy:  Heparin level 0.3-0.5 units/ml   APTT 66-85 sec Monitor platelets by anticoagulation protocol: Yes   Plan:  Increase heparin gtt to 1450 units/hr Check 8 hr aPTT and heparin level (if can use) Daily heparin level and CBC  [place] Crane, Vena Austria, BCPS, BCCP Clinical Pharmacist  11/26/2020 10:30 PM   [facility] pharmacy phone numbers are listed on [email or web address]

## 2020-11-26 NOTE — Progress Notes (Signed)
SLP Cancellation Note  Patient Details Name: Emily Lozano MRN: [id number] DOB: [birth year]   Cancelled treatment:        Intubated. Will follow probress   Houston Siren 11/26/2020, 8:24 AM   Orbie Pyo Colvin Caroli.Ed Risk analyst [phone] Office [phone]

## 2020-11-26 NOTE — Evaluation (Signed)
Occupational Therapy Evaluation Patient Details Name: Emily Lozano MRN: [id number] DOB: Jan 30, 1951 Today's Date: 11/26/2020    History of Present Illness 70 y.o. female with a medical history significant for hypertension, hyperlipidemia, type 2 diabetes mellitus, depression, GERD, melanoma, sleep apnea, and recent exposure to COVID-19 with a negative test at her PCP yesterday but diagnosed with acute bronchitis. Pt presents with acute onset R weakness, aphasia, left gaze. CTA demonstrates L M2 occlusion. Pt underwent thrombectomy on 11/24/2020 (unsuccessful)   Clinical Impression   Pt unable to provide home information or PLOF; pt does have son who visited earlier today. Pt currently requiring Total A for ADLs and bed mobility. Pt with poor following of commands, functional use of RUE, balance, cognition, and strength. Pt sitting at EOB with Total A for support. Pt would benefit from further acute OT to facilitate safe dc. Recommend dc to CIR for further OT to optimize safety, independence with ADLs, and return to PLOF.   SpO2 90s on vent at 40% FiO2    Follow Up Recommendations  CIR    Equipment Recommendations  Other (comment) (Defer to next venue)    Recommendations for Other Services PT consult;Speech consult;Rehab consult     Precautions / Restrictions Precautions Precautions: Fall;Other (comment) Precaution Comments: COVID postive      Mobility Bed Mobility Overal bed mobility: Needs Assistance Bed Mobility: Sit to Supine;Supine to Sit     Supine to sit: Max assist;Total assist;+2 for physical assistance Sit to supine: Max assist;+2 for physical assistance   General bed mobility comments: Pt too lethargic initially to assist significant to EOB, but pt intiated laying back down in the bed, by intentionally pulling to the left toward the pillow.    Transfers                 General transfer comment: NT today.    Balance Overall balance assessment: Needs  assistance Sitting-balance support: Feet supported;Single extremity supported Sitting balance-Leahy Scale: Poor Sitting balance - Comments: pt listed R and posteriorly unless assist to hold midline.                                   ADL either performed or assessed with clinical judgement   ADL Overall ADL's : Needs assistance/impaired                                       General ADL Comments: Total A for ADLs     Vision   Vision Assessment?: Yes Tracking/Visual Pursuits: Requires cues, head turns, or add eye shifts to track;Unable to hold eye position out of midline;Impaired - to be further tested in functional context     Perception     Praxis      Pertinent Vitals/Pain Pain Assessment: Faces Faces Pain Scale: No hurt Pain Intervention(s): Monitored during session     Hand Dominance     Extremity/Trunk Assessment Upper Extremity Assessment Upper Extremity Assessment: RUE deficits/detail RUE Deficits / Details: Flaccid. withdrawl to pain   Lower Extremity Assessment Lower Extremity Assessment: Defer to PT evaluation RLE Deficits / Details: low tone if not flaccid       Communication Communication Communication: Expressive difficulties   Cognition Arousal/Alertness: Awake/alert Behavior During Therapy: WFL for tasks assessed/performed Overall Cognitive Status: Difficult to assess  General Comments: Pt follow cues to open her eyes. Not following simple commands.   General Comments  SpO2 90s on vent at 40% FiO2    Exercises     Shoulder Instructions      Home Living Family/patient expects to be discharged to:: Private residence                                 Additional Comments: Pt unable to provide due to aphasia and intubation. RN reports pt has son who visited earlier      Prior Functioning/Environment          Comments: Pt unable to provide information  about PLOF        OT Problem List: Decreased strength;Decreased range of motion;Decreased activity tolerance;Impaired balance (sitting and/or standing);Decreased coordination;Decreased cognition;Decreased safety awareness;Decreased knowledge of use of DME or AE;Decreased knowledge of precautions;Pain;Impaired UE functional use      OT Treatment/Interventions: Self-care/ADL training;Therapeutic exercise;Energy conservation;DME and/or AE instruction;Therapeutic activities;Patient/family education    OT Goals(Current goals can be found in the care plan section) Acute Rehab OT Goals Patient Stated Goal: Pt unstable to state OT Goal Formulation: Patient unable to participate in goal setting Time For Goal Achievement: 12/10/20 Potential to Achieve Goals: Good  OT Frequency: Min 2X/week   Barriers to D/C:            Co-evaluation PT/OT/SLP Co-Evaluation/Treatment: Yes Reason for Co-Treatment: For patient/therapist safety;To address functional/ADL transfers;Complexity of the patient's impairments (multi-system involvement) PT goals addressed during session: Mobility/safety with mobility OT goals addressed during session: ADL's and self-care      AM-PAC OT "6 Clicks" Daily Activity     Outcome Measure Help from another person eating meals?: Total Help from another person taking care of personal grooming?: Total Help from another person toileting, which includes using toliet, bedpan, or urinal?: Total Help from another person bathing (including washing, rinsing, drying)?: Total Help from another person to put on and taking off regular upper body clothing?: Total Help from another person to put on and taking off regular lower body clothing?: Total 6 Click Score: 6   End of Session Equipment Utilized During Treatment: Oxygen Nurse Communication: Mobility status  Activity Tolerance: Patient limited by lethargy;Patient limited by fatigue Patient left: in bed;with call bell/phone within  reach;with bed alarm set;with restraints reapplied;with SCD's reapplied  OT Visit Diagnosis: Unsteadiness on feet (R26.81);Other abnormalities of gait and mobility (R26.89);Muscle weakness (generalized) (M62.81);Pain;Hemiplegia and hemiparesis Hemiplegia - Right/Left: Right Hemiplegia - caused by: Cerebral infarction Pain - part of body:  (Generalized)                Time: 4270-6237 OT Time Calculation (min): 27 min Charges:  OT General Charges $OT Visit: 1 Visit OT Evaluation $OT Eval High Complexity: 1 High  Hue Frick MSOT, OTR/L Acute Rehab Pager: [phone] Office: [place] 11/26/2020, 5:38 PM

## 2020-11-26 NOTE — Progress Notes (Addendum)
Referring Physician(s): Dr. Lourena Simmonds - Code Stroke  Supervising Physician: Pedro Earls  Patient Status:  [facility] - In-pt  Chief Complaint:  Stroke F/U  Subjective: 70 y.o. female inpatient. History of HTN, HLD, DM, Melanoma, Recent exposure of COVID positive upon admission. Presented to the ED at [facility] on 2.26.22 with a side onset of right sided hemiparesis, aphasia and left sided gaze. NIHSS 17. CT Angio showed acute CVA s/p cerebral arteriogram showed a left M2 segment occlusion s/p  thrombectomy of left MCA, M2 occlusion achieving TICI 2b revasculation via right femoral approach on 2.26.22 by Dr. Dr Sindy Messing.  Patient laying in bed, intubated but alert to verbal stimuli. She is able to follow simple commands. Minimal movement noted to right upper extremity and no movement in the right lower extremity. Left sided gaze. She does not follow simple commands. Right femoral puncture site clean dry and intact.   Allergies: Morphine and related  Medications: Prior to Admission medications   Medication Sig Start Date End Date Taking? Authorizing Provider  amLODipine (NORVASC) 5 MG tablet Take 5 mg by mouth daily. 10/23/20  Yes [provider]  aspirin EC 81 MG tablet Take 81 mg by mouth daily.   Yes [provider]  buPROPion (WELLBUTRIN XL) 300 MG 24 hr tablet Take 300 mg by mouth daily.   Yes [provider]  Dextromethorphan-guaiFENesin (ROBITUSSIN COUGH+CHEST CONG DM) 20-200 MG/20ML LIQD Take 20 mLs by mouth 2 (two) times daily as needed (cough/congestion).   Yes [provider]  dicyclomine (BENTYL) 10 MG capsule Take 10 mg by mouth 3 (three) times daily as needed (diarrhea).   Yes [provider]  DM-APAP-CPM (CORICIDIN HBP PO) Take 1 tablet by mouth 2 (two) times daily as needed (cough congestion).   Yes [provider]  glimepiride (AMARYL) 4 MG tablet Take 4 mg by mouth See admin instructions. Take one  tablet (4 mg) by mouth every morning, take an additional tablet (4 mg) for CBG 250-300 10/23/20  Yes [provider]  hydroxychloroquine (PLAQUENIL) 200 MG tablet Take 200-400 mg by mouth See admin instructions. Take 2 tablets (400 mg) by mouth 1st day, then take 1 tablet (200 mg) 2nd day, then repeat alternating days   Yes [provider]  losartan-hydrochlorothiazide (HYZAAR) 100-12.5 MG tablet Take 1 tablet by mouth daily.   Yes [provider]  metFORMIN (GLUCOPHAGE) 500 MG tablet Take 500 mg by mouth 2 (two) times daily with a meal.   Yes [provider]  metoprolol succinate (TOPROL-XL) 100 MG 24 hr tablet Take 100 mg by mouth daily. Take with or immediately following a meal.   Yes [provider]  naproxen sodium (ALEVE) 220 MG tablet Take 220 mg by mouth 2 (two) times daily as needed (pain/headache).   Yes [provider]  pantoprazole (PROTONIX) 40 MG tablet Take 40 mg by mouth 2 (two) times daily.   Yes [provider]  rosuvastatin (CRESTOR) 10 MG tablet Take 10 mg by mouth daily.   Yes [provider]  Ascorbic Acid (VITAMIN C) 1000 MG tablet Take 1,000 mg by mouth daily.    [provider]  azithromycin (ZITHROMAX) 250 MG tablet Take 250-500 mg by mouth See admin instructions. Take 2 tablets (500 mg) by mouth 1st day, then take 1 tablet (250 mg) daily on days 2-5 11/23/20   [provider]  benzonatate (TESSALON) 200 MG capsule Take 200 mg by mouth 3 (three)  times daily as needed for cough. 11/23/20   [provider]  predniSONE (DELTASONE) 20 MG tablet Take 20 mg by mouth daily. 11/23/20   [provider]  Zinc 50 MG TABS Take 50 mg by mouth daily.    [provider]     Vital Signs: BP (!) 138/57   Pulse 73   Temp 98.1 F (36.7 C) (Axillary)   Resp 16   Ht 5\' 4"  (1.626 m)   Wt 156 lb 15.5 oz (71.2 kg)   SpO2 97%   BMI 26.94 kg/m   Physical Exam Vitals and  nursing note reviewed.  Constitutional:      Appearance: She is well-developed.  HENT:     Head: Normocephalic and atraumatic.  Eyes:     Pupils: Pupils are equal, round, and reactive to light.  Cardiovascular:     Rate and Rhythm: Normal rate.  Pulmonary:     Comments: Patient intubated. Musculoskeletal:     Cervical back: Normal range of motion.  Neurological:     Mental Status: She is alert.     Comments: Alert to verbal stimuli. Able to folllow simple commands. PERRL bilaterally. Flicker of movement to right upper extremity. No spontaneous movement of right lower dexterity.  Distal pulses (DPs) 1+ bilaterally.  Right femoral access site c/d/i     Imaging: MR BRAIN WO CONTRAST  Result Date: 11/25/2020 CLINICAL DATA:  Acute stroke presentation 11/24/2020. Left insular infarction. EXAM: MRI HEAD WITHOUT CONTRAST TECHNIQUE: Multiplanar, multiecho pulse sequences of the brain and surrounding structures were obtained without intravenous contrast. COMPARISON:  CT and interventional studies over the last day. FINDINGS: Brain: Diffusion imaging shows acute infarction in the deep insula and left frontoparietal cortical and subcortical brain. Mild swelling in the region. There are either minimal petechial blood products or there is susceptibility artifact related to thrombosed vessels. No frank hematoma. No mass effect or shift. Elsewhere, the brainstem and cerebellum are unremarkable. Both cerebral hemispheres show moderate to severe chronic small-vessel ischemic changes throughout the white matter. No hydrocephalus. No extra-axial collection. Vascular: Major vessels at base of the brain show flow. Skull and upper cervical spine: Negative Sinuses/Orbits: Mucosal inflammatory changes of paranasal sinuses. Orbits negative. Other: None IMPRESSION: 1. Acute infarction in the left MCA territory affecting the deep insula and left frontoparietal cortical and subcortical brain. Minimal petechial blood  products or susceptibility artifact related to thrombosed vessels. No frank hematoma. No mass effect or shift. 2. Moderate to severe chronic small-vessel ischemic changes elsewhere throughout the cerebral hemispheric white matter. Electronically Signed   By: Nelson Chimes M.D.   On: 11/25/2020 02:18   CT CEREBRAL PERFUSION W CONTRAST  Result Date: 11/24/2020 CLINICAL DATA:  Neuro deficit, acute, stroke suspected EXAM: CT PERFUSION BRAIN TECHNIQUE: Multiphase CT imaging of the brain was performed following IV bolus contrast injection. Subsequent parametric perfusion maps were calculated using RAPID software. CONTRAST:  68mL OMNIPAQUE IOHEXOL 350 MG/ML SOLN COMPARISON:  Concurrent CTA head and neck. FINDINGS: CT Brain Perfusion Findings: CBF (<30%) Volume: 1mL Perfusion (Tmax>6.0s) volume: 48mL Mismatch Volume: 82mL ASPECTS on noncontrast CT Head: 9 at 16:42 today. Infarct Core: 0 mL Infarction Location:Not applicable. IMPRESSION: Perfusion imaging demonstrates no infarct core. 30 mL region of ischemia involving the left MCA territory. Electronically Signed   By: Primitivo Gauze M.D.   On: 11/24/2020 16:55   DG Chest Port 1 View  Result Date: 11/24/2020 CLINICAL DATA:  COVID positive EXAM: PORTABLE CHEST 1 VIEW COMPARISON:  None. FINDINGS:  The heart size and mediastinal contours are mildly enlarged. ETT is 4 cm above the carina. NG tube is seen below the diaphragm. Aortic knob calcifications are seen. There is hazy patchy airspace opacity seen at the periphery of the right lower lung and at the left lung base. No pleural effusion. IMPRESSION: ETT and NG tube in satisfactory position Bilateral multifocal airspace opacities, consistent with multifocal pneumonia Electronically Signed   By: Prudencio Pair M.D.   On: 11/24/2020 22:14   CT HEAD CODE STROKE WO CONTRAST  Result Date: 11/24/2020 CLINICAL DATA:  Code stroke.  Neuro deficit, acute, stroke suspected EXAM: CT HEAD WITHOUT CONTRAST TECHNIQUE:  Contiguous axial images were obtained from the base of the skull through the vertex without intravenous contrast. COMPARISON:  06/27/2020. FINDINGS: Brain: No intracranial hemorrhage. New focal hypodensity involving the left insula. No mass lesion. No midline shift, ventriculomegaly or extra-axial fluid collection. Chronic microvascular ischemic changes. Vascular: No hyperdense vessel or unexpected calcification. Minimal carotid siphon atherosclerotic calcifications. Skull: Negative for fracture or focal lesion. Sinuses/Orbits: No acute finding.  Mild pansinus mucosal thickening. Other: Right parietal scalp nodule is unchanged. ASPECTS [facility] Stroke Program Early CT Score) - Ganglionic level infarction (caudate, lentiform nuclei, internal capsule, insula, M1-M3 cortex): 6 - Supraganglionic infarction (M4-M6 cortex): 3 Total score (0-10 with 10 being normal): 9 IMPRESSION: 1. 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 2. Chronic microvascular ischemic changes. 3. ASPECTS is 9 Code stroke imaging results were communicated on 11/24/2020 at 4:23 pm to provider Dr. Rory Percy via secure text paging. Electronically Signed   By: Primitivo Gauze M.D.   On: 11/24/2020 16:24   VAS [place] LOWER EXTREMITY VENOUS (DVT)  Result Date: 11/25/2020  Lower Venous DVT Study Indications: Stroke, and Covid-19.  Comparison Study: No prior study on file Performing Technologist: Sharion Dove RVS  Examination Guidelines: A complete evaluation includes B-mode imaging, spectral Doppler, color Doppler, and power Doppler as needed of all accessible portions of each vessel. Bilateral testing is considered an integral part of a complete examination. Limited examinations for reoccurring indications may be performed as noted. The reflux portion of the exam is performed with the patient in reverse Trendelenburg.  +---------+---------------+---------+-----------+----------+--------------+ RIGHT     CompressibilityPhasicitySpontaneityPropertiesThrombus Aging +---------+---------------+---------+-----------+----------+--------------+ CFV      Full           Yes      Yes                                 +---------+---------------+---------+-----------+----------+--------------+ SFJ      Full                                                        +---------+---------------+---------+-----------+----------+--------------+ FV Prox  Full                                                        +---------+---------------+---------+-----------+----------+--------------+ FV Mid   Full                                                        +---------+---------------+---------+-----------+----------+--------------+  FV DistalFull                                                        +---------+---------------+---------+-----------+----------+--------------+ PFV      Full                                                        +---------+---------------+---------+-----------+----------+--------------+ POP      Full           Yes      Yes                                 +---------+---------------+---------+-----------+----------+--------------+ PTV      Full                                                        +---------+---------------+---------+-----------+----------+--------------+ PERO     None                                         Acute          +---------+---------------+---------+-----------+----------+--------------+   +---------+---------------+---------+-----------+----------+--------------+ LEFT     CompressibilityPhasicitySpontaneityPropertiesThrombus Aging +---------+---------------+---------+-----------+----------+--------------+ CFV      Full           Yes      Yes                                 +---------+---------------+---------+-----------+----------+--------------+ SFJ      Full                                                         +---------+---------------+---------+-----------+----------+--------------+ FV Prox  Full                                                        +---------+---------------+---------+-----------+----------+--------------+ FV Mid   Full                                                        +---------+---------------+---------+-----------+----------+--------------+ FV DistalFull                                                        +---------+---------------+---------+-----------+----------+--------------+  PFV      Full                                                        +---------+---------------+---------+-----------+----------+--------------+ POP      Full           Yes      Yes                                 +---------+---------------+---------+-----------+----------+--------------+ PTV      Full                                                        +---------+---------------+---------+-----------+----------+--------------+ PERO     Full                                                        +---------+---------------+---------+-----------+----------+--------------+     Summary: RIGHT: - Findings consistent with acute deep vein thrombosis involving the right peroneal veins.  LEFT: - There is no evidence of deep vein thrombosis in the lower extremity.  *See table(s) above for measurements and observations.    Preliminary    ECHOCARDIOGRAM LIMITED  Result Date: 11/25/2020    ECHOCARDIOGRAM REPORT   Patient Name:   SAKIYA STEPKA Date of Exam: 11/25/2020 Medical Rec #:  [id number]        Height:       64.0 in Accession #:    [id number]       Weight:       176.4 lb Date of Birth:  [birth year]        BSA:          1.855 m Patient Age:    63 years         BP:           119/66 mmHg Patient Gender: F                HR:           77 bpm. Exam Location:  Inpatient Procedure: Limited Echo, Cardiac Doppler and Color Doppler Indications:    Stroke  I63.9  History:        Patient has no prior history of Echocardiogram examinations.                 Risk Factors:Hypertension, Diabetes and Former Smoker.  Sonographer:    Vickie Epley RDCS Referring Phys: [id number] ASHISH ARORA  Sonographer Comments: Echo performed with patient supine and on artificial respirator. Covid positive. IMPRESSIONS  1. Left ventricular ejection fraction, by estimation, is 55 to 60%. The left ventricle has normal function. The left ventricle has no regional wall motion abnormalities. Left ventricular diastolic parameters are consistent with Grade I diastolic dysfunction (impaired relaxation).  2. Right ventricular systolic function is normal. The right ventricular size is normal.  3. The mitral valve is normal in structure. Trivial mitral valve regurgitation. No evidence of mitral  stenosis.  4. The aortic valve is tricuspid. Aortic valve regurgitation is trivial. Mild aortic valve sclerosis is present, with no evidence of aortic valve stenosis.  5. The inferior vena cava is normal in size with greater than 50% respiratory variability, suggesting right atrial pressure of 3 mmHg. FINDINGS  Left Ventricle: Left ventricular ejection fraction, by estimation, is 55 to 60%. The left ventricle has normal function. The left ventricle has no regional wall motion abnormalities. The left ventricular internal cavity size was normal in size. There is  no left ventricular hypertrophy. Left ventricular diastolic parameters are consistent with Grade I diastolic dysfunction (impaired relaxation). Right Ventricle: The right ventricular size is normal.Right ventricular systolic function is normal. Left Atrium: Left atrial size was normal in size. Right Atrium: Right atrial size was normal in size. Pericardium: There is no evidence of pericardial effusion. Mitral Valve: The mitral valve is normal in structure. Trivial mitral valve regurgitation. No evidence of mitral valve stenosis. Tricuspid Valve: The tricuspid  valve is normal in structure. Tricuspid valve regurgitation is trivial. No evidence of tricuspid stenosis. Aortic Valve: The aortic valve is tricuspid. Aortic valve regurgitation is trivial. Mild aortic valve sclerosis is present, with no evidence of aortic valve stenosis. Pulmonic Valve: The pulmonic valve was not well visualized. Pulmonic valve regurgitation is trivial. No evidence of pulmonic stenosis. Aorta: The aortic root is normal in size and structure. Venous: The inferior vena cava is normal in size with greater than 50% respiratory variability, suggesting right atrial pressure of 3 mmHg.  LEFT VENTRICLE PLAX 2D LVIDd:         4.10 cm     Diastology LVIDs:         3.20 cm     LV e' medial:    6.09 cm/s LV PW:         1.00 cm     LV E/e' medial:  11.1 LV IVS:        0.90 cm     LV e' lateral:   9.03 cm/s LVOT diam:     2.00 cm     LV E/e' lateral: 7.5 LV SV:         42 LV SV Index:   23 LVOT Area:     3.14 cm  LV Volumes (MOD) LV vol d, MOD A2C: 88.5 ml LV vol d, MOD A4C: 80.0 ml LV vol s, MOD A2C: 46.6 ml LV vol s, MOD A4C: 41.6 ml LV SV MOD A2C:     41.9 ml LV SV MOD A4C:     80.0 ml LV SV MOD BP:      40.6 ml RIGHT VENTRICLE RV S prime:     13.80 cm/s TAPSE (M-mode): 1.5 cm LEFT ATRIUM         Index LA diam:    2.70 cm 1.46 cm/m  AORTIC VALVE LVOT Vmax:   71.80 cm/s LVOT Vmean:  56.700 cm/s LVOT VTI:    0.134 m  AORTA Ao Root diam: 3.70 cm MITRAL VALVE MV Area (PHT): 3.85 cm    SHUNTS MV Decel Time: 197 msec    Systemic VTI:  0.13 m MV E velocity: 67.70 cm/s  Systemic Diam: 2.00 cm MV A velocity: 81.00 cm/s MV E/A ratio:  0.84 Kirk Ruths MD Electronically signed by Kirk Ruths MD Signature Date/Time: 11/25/2020/10:54:57 AM    Final    CT ANGIO HEAD CODE STROKE  Result Date: 11/24/2020 CLINICAL DATA:  Neuro deficit, acute, stroke suspected EXAM: CT ANGIOGRAPHY  HEAD AND NECK TECHNIQUE: Multidetector CT imaging of the head and neck was performed using the standard protocol during bolus  administration of intravenous contrast. Multiplanar CT image reconstructions and MIPs were obtained to evaluate the vascular anatomy. Carotid stenosis measurements (when applicable) are obtained utilizing NASCET criteria, using the distal internal carotid diameter as the denominator. CONTRAST:  86mL OMNIPAQUE IOHEXOL 350 MG/ML SOLN COMPARISON:  None. FINDINGS: CTA NECK FINDINGS Aortic arch: Standard branching. Mild aortic arch atherosclerotic calcifications. Mild proximal left subclavian artery narrowing secondary to atheromatous plaque. Otherwise patent great vessel origins. Right carotid system: Patent. Minimal proximal ICA atherosclerotic calcifications without significant narrowing. Left carotid system: Patent. 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 Vertebral arteries: Mild left vertebral artery origin narrowing secondary to atheromatous disease. Otherwise patent and codominant. Skeleton: No acute finding.  Mild spondylosis. Other neck: No adenopathy.  No soft tissue mass. Upper chest: Patchy and nodular bilateral upper lung opacities concerning for infectious/inflammatory foci. Review of the MIP images confirms the above findings CTA HEAD FINDINGS Anterior circulation: Minimal carotid siphon atherosclerotic calcifications. Patent ICAs. Patent ophthalmic artery origins. Right A1 segment hypoplasia. Patent ACAs. Patent right MCA. Left M2 segment occlusion. Posterior circulation: Patent V4 segments. Patent right PICA. Patent basilar and superior cerebellar arteries. Patent bilateral PCAs. Venous sinuses: As permitted by contrast timing, patent. Anatomic variants: Please see above. Review of the MIP images confirms the above findings IMPRESSION: CTA neck: 30-40% distal left CCA narrowing secondary to atheromatous plaque. Mild narrowing of the proximal left subclavian and left vertebral artery origin. Bilateral upper lung nodular/patchy opacities concerning for infectious/inflammatory foci.  CTA head: Left M2 branch occlusion. No aneurysm or dissection. Code stroke imaging results were communicated on 11/24/2020 at 4:41 pm to provider Dr. Rory Percy via secure text paging. Electronically Signed   By: Primitivo Gauze M.D.   On: 11/24/2020 16:41   CT ANGIO NECK CODE STROKE  Result Date: 11/24/2020 CLINICAL DATA:  Neuro deficit, acute, stroke suspected EXAM: CT ANGIOGRAPHY HEAD AND NECK TECHNIQUE: Multidetector CT imaging of the head and neck was performed using the standard protocol during bolus administration of intravenous contrast. Multiplanar CT image reconstructions and MIPs were obtained to evaluate the vascular anatomy. Carotid stenosis measurements (when applicable) are obtained utilizing NASCET criteria, using the distal internal carotid diameter as the denominator. CONTRAST:  61mL OMNIPAQUE IOHEXOL 350 MG/ML SOLN COMPARISON:  None. FINDINGS: CTA NECK FINDINGS Aortic arch: Standard branching. Mild aortic arch atherosclerotic calcifications. Mild proximal left subclavian artery narrowing secondary to atheromatous plaque. Otherwise patent great vessel origins. Right carotid system: Patent. Minimal proximal ICA atherosclerotic calcifications without significant narrowing. Left carotid system: Patent. 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 Vertebral arteries: Mild left vertebral artery origin narrowing secondary to atheromatous disease. Otherwise patent and codominant. Skeleton: No acute finding.  Mild spondylosis. Other neck: No adenopathy.  No soft tissue mass. Upper chest: Patchy and nodular bilateral upper lung opacities concerning for infectious/inflammatory foci. Review of the MIP images confirms the above findings CTA HEAD FINDINGS Anterior circulation: Minimal carotid siphon atherosclerotic calcifications. Patent ICAs. Patent ophthalmic artery origins. Right A1 segment hypoplasia. Patent ACAs. Patent right MCA. Left M2 segment occlusion. Posterior circulation:  Patent V4 segments. Patent right PICA. Patent basilar and superior cerebellar arteries. Patent bilateral PCAs. Venous sinuses: As permitted by contrast timing, patent. Anatomic variants: Please see above. Review of the MIP images confirms the above findings IMPRESSION: CTA neck: 30-40% distal left CCA narrowing secondary to atheromatous plaque. Mild narrowing of  the proximal left subclavian and left vertebral artery origin. Bilateral upper lung nodular/patchy opacities concerning for infectious/inflammatory foci. CTA head: Left M2 branch occlusion. No aneurysm or dissection. Code stroke imaging results were communicated on 11/24/2020 at 4:41 pm to provider Dr. Rory Percy via secure text paging. Electronically Signed   By: Primitivo Gauze M.D.   On: 11/24/2020 16:41    Labs:  CBC: Recent Labs    11/24/20 1602 11/24/20 1604 11/24/20 2036 11/24/20 2058  WBC 5.5  --   --  5.3  HGB 11.7* 11.9* 10.9* 11.9*  HCT 37.5 35.0* 32.0* 40.3  PLT 403*  --   --  136*    COAGS: Recent Labs    11/24/20 1602  INR SPECIMEN CLOTTED    BMP: Recent Labs    11/24/20 1602 11/24/20 1604 11/24/20 2036 11/25/20 0608 11/26/20 0413  NA 137 141 144 143 139  K 3.7 3.8 2.9* 2.8* 3.9  CL 105 106  --  107 109  CO2 19*  --   --  23 15*  GLUCOSE 285* 288*  --  202* 301*  BUN 13 16  --  9 10  CALCIUM 8.6*  --   --  8.1* 8.1*  CREATININE 0.75 0.60  --  0.89 0.72  GFRNONAA >60  --   --  >60 >60    LIVER FUNCTION TESTS: Recent Labs    11/24/20 1602 11/25/20 0608  BILITOT 0.7 <0.1*  AST 18 17  ALT 13 14  ALKPHOS 55 55  PROT 6.9 6.3*  ALBUMIN 2.9* 2.5*    Assessment and Plan: 70 y.o. female inpatient. History of HTN, HLD, DM, Melanoma, Recent exposure of COVID positive upon admission. Presented to the ED at [facility] on 2.26.22 with a side onset of right sided hemiparesis, aphasia and left sided gaze. NIHSS 17. CT Angio showed acute CVA s/p cerebral arteriogram showed a left M2 segment occlusion s/p   thrombectomy of left MCA, M2 occlusion achieving TICI 2b revasculation via right femoral approach on 2.26.22 by Dr. Dr Sindy Messing.  Patient laying in bed, intubated but alert to verbal stimuli. She is able to follow simple commands. Minimal movement noted to right upper extremity and no movement in the right lower extremity. Left sided gaze. She does not follow simple commands. Right femoral puncture site clean dry and intact. Now on Heparin gtt for DVT and febrile.  Further plans per neurology/CCM- appreciate and agree with management. NIR to follow  Electronically Signed: Jacqualine Mau, NP 11/26/2020, 12:20 PM   I spent a total of 15 Minutes at the patient's bedside AND on the patient's hospital floor or unit, greater than 50% of which was counseling/coordinating care for cerebral arteriogram with intervention

## 2020-11-26 NOTE — Progress Notes (Signed)
NAME:  Emily Lozano, MRN:  [id number], DOB:  04-15-1951, LOS: 2 ADMISSION DATE:  11/24/2020, CONSULTATION DATE:  11/24/2020  REFERRING MD:  Rory Percy - neuro, CHIEF COMPLAINT:  L M2 CVA, Covid infection, respiratory failure  Brief History:  70 yo admitted 2/26 with L M2 occlusion, outside of tpa window. Went to [facility] for thrombectomy, unsuccessful, c/b bleed.Resulted + for Covid in NIR, will remain intubated. Reportedly has had some mild URI sx x 1-2 wks (had tested negative OP in this time frame). hypertension, hyperlipidemia, type 2 diabetes mellitus, depression, GERD, melanoma, sleep apnea, and recent exposure to COVID-19  History of Present Illness:  70 yo F PMH HTN HLD Dm2 who presented as a code stroke 2/26 with acute R hemiparesis, L gaze preference, aphasia. KWN 10:30 AM -- outside of window for tpa. CTA head reveals L M2 occlusion, CTA cp with L MCA ischemia. Taken to [facility] for thrombectomy -- c/b bleed, thrombectomy unsuccessful.In NIR, COVID-19 resulted positive. Reportedly, pts family had +Covid recently, pt had URI sx and was tested for COVID 2/25 - and was negative, started on ZPack at that time.  Repeat neuroimaging is pending during [place] will be to remain intubated following case and transfer to ICU. PCCM consulted in this setting   Past Medical History:  HTN HLD DM2   Significant Hospital Events:  11/24/2020 - admit => Left distal M2/MCA middle division branch occlusion vi RCFA. : There was a left M2/MCA middle division branch occlusion with likely atherosclerotic plaque underlying. Two direct contact aspiration passes performed with recanalization of the proximal aspect of the M2 branch and persistent distal occlusion. Two passes were then performed with stent retriever with persistent occlusion. Flat panel head CT showed contrast extravasation in the left sylvian fissure. Delayed angiograms and delayed flat panel CT showed no evidence of active extravasation.  Consults:   11/24/2020  - ccm  Procedures:  2/26 - see events  Significant Diagnostic Tests:  2.26 - see events  Micro Data:  11/24/2020 - covid +  Antimicrobials:  2/26 - remdesivir  Interim History / Subjective:  No acute events overnight  On wean trial this morning   Objective   Blood pressure (!) 141/60, pulse 86, temperature 98.1 F (36.7 C), temperature source Axillary, resp. rate (!) 26, height 5\' 4"  (1.626 m), weight 71.2 kg, SpO2 97 %.    Vent Mode: CPAP;PSV FiO2 (%):  [40 %] 40 % Set Rate:  [4 bmp-14 bmp] 14 bmp Vt Set:  [430 mL] 430 mL PEEP:  [5 cmH20] 5 cmH20 Pressure Support:  [12 cmH20] 12 cmH20 Plateau Pressure:  [14 cmH20-19 cmH20] 17 cmH20   Intake/Output Summary (Last 24 hours) at 11/26/2020 1002 Last data filed at 11/26/2020 0900 Gross per 24 hour  Intake 3931.63 ml  Output 515 ml  Net 3416.63 ml   Filed Weights   11/24/20 1909 11/26/20 0253  Weight: 80 kg 71.2 kg    Examination: General: Acute on chronically ill appearing elderly female  seen lying in bed on mechanical vent  in NAD HEENT: ETT, MM pink/moist, PERRL,  Neuro: Able to follow simple commands, flicker of movement seen to right upper extremity to command  CV: s1s2 regular rate and rhythm, no murmur, rubs, or gallops,  PULM:  Clear to ascultation, no added breath sound, tolerating SBT of 12/5 currently  GI: soft, bowel sounds active in all 4 quadrants, non-tender, non-distended, tolerating TF Extremities: warm/dry, no edema  Skin: no rashes or lesions  Resolved Hospital Problem list     Assessment & Plan:   Acute ischemic stroke Feb 26th, Left M2 MCA.  Hypertensive Emergency -NIH stroke score 16 at admit. - in setting of covid and other risk factors - covid coagulopathy -Out of window for tPA, s/p mechanical thrombectomy with persistent distal occlusion P: Management per neurology  Maintain neuro protective measures; goal for eurothermia, euglycemia, eunatermia, normoxia, and PCO2 goal of  35-40 Nutrition and bowel regiment  Seizure precautions  Aspirations precautions  B/p goal per neurology   Acute respiratory failure post op due to stroke in setting of covid.  COVID 19 positive, sympomatic prior to admission P: Continue ventilator support with lung protective strategies  Wean PEEP and FiO2 for sats greater than 90%. Head of bed elevated 30 degrees. Plateau pressures less than 30 cm H20.  Follow intermittent chest x-ray and ABG.   SAT/SBT as tolerated, mentation preclude extubation  Ensure adequate pulmonary hygiene  Follow cultures  VAP bundle in place  PAD protocol Remdesevir, add steroids for symptomatic COVID  Acute lower extremity DVT - in the setting of covid 19 infection P: Heparin drip with no bolus  Close neuro assessment in the setting of anticoagulation  Supportive care   DM on Metformin and Amaryl at home -Home medications include, Glimepiride, Metformin,  -Hemoglobin A1C 10.6 P: SSI  CBG q4hrs  Add tube feed coverage  Increase Lantus to 20unit daily   History of Rheumatoid arthritis Immune suppressed  -On Plaquenil and Methotrexate P: Hold home methotrexate  Depression/Anxiety  -on lexapro and wellbutrin at home P: Hold home medications   Best practice (evaluated daily)  Diet: NPO Pain/Anxiety/Delirium protocol (if indicated): diprivan VAP protocol (if indicated): yes DVT prophylaxis: scd + (lovenox tbd by neuro after repeat ct) GI prophylaxis: ppi Glucose control: ssi Mobility: BR Disposition: ICU  Family update: Updated son at bedside morning of 2/28  Goals of Care:  Per primary   Labs   CBC: Recent Labs  Lab 11/24/20 1602 11/24/20 1604 11/24/20 2036 11/24/20 2058  WBC 5.5  --   --  5.3  NEUTROABS 4.1  --   --   --   HGB 11.7* 11.9* 10.9* 11.9*  HCT 37.5 35.0* 32.0* 40.3  MCV 80.6  --   --  81.6  PLT 403*  --   --  136*    Basic Metabolic Panel: Recent Labs  Lab 11/24/20 1602 11/24/20 1604 11/24/20 2036  11/25/20 0608 11/26/20 0413  NA 137 141 144 143 139  K 3.7 3.8 2.9* 2.8* 3.9  CL 105 106  --  107 109  CO2 19*  --   --  23 15*  GLUCOSE 285* 288*  --  202* 301*  BUN 13 16  --  9 10  CREATININE 0.75 0.60  --  0.89 0.72  CALCIUM 8.6*  --   --  8.1* 8.1*  MG  --   --   --  1.9 2.3  PHOS  --   --   --  2.7 2.7   GFR: Estimated Creatinine Clearance: 64.2 mL/min (by C-G formula based on SCr of 0.72 mg/dL). Recent Labs  Lab 11/24/20 1602 11/24/20 2058 11/25/20 0544 11/25/20 0608  PROCALCITON  --  <0.10  --  <0.10  WBC 5.5 5.3  --   --   LATICACIDVEN  --  2.4* 2.5*  --     Liver Function Tests: Recent Labs  Lab 11/24/20 1602 11/25/20 0608  AST 18 17  ALT 13 14  ALKPHOS 55 55  BILITOT 0.7 <0.1*  PROT 6.9 6.3*  ALBUMIN 2.9* 2.5*   No results for input(s): LIPASE, AMYLASE in the last 168 hours. No results for input(s): AMMONIA in the last 168 hours.  ABG    Component Value Date/Time   PHART 7.418 11/24/2020 2036   PCO2ART 37.4 11/24/2020 2036   PO2ART 97 11/24/2020 2036   HCO3 24.1 11/24/2020 2036   TCO2 25 11/24/2020 2036   O2SAT 98.0 11/24/2020 2036     Coagulation Profile: Recent Labs  Lab 11/24/20 1602  INR SPECIMEN CLOTTED    Cardiac Enzymes: No results for input(s): CKTOTAL, CKMB, CKMBINDEX, TROPONINI in the last 168 hours.  HbA1C: Hgb A1c MFr Bld  Date/Time Value Ref Range Status  11/25/2020 06:08 AM 10.6 (H) 4.8 - 5.6 % Final    Comment:    (NOTE) Pre diabetes:          5.7%-6.4%  Diabetes:              >6.4%  Glycemic control for   <7.0% adults with diabetes   11/24/2020 08:58 PM 10.4 (H) 4.8 - 5.6 % Final    Comment:    (NOTE) Pre diabetes:          5.7%-6.4%  Diabetes:              >6.4%  Glycemic control for   <7.0% adults with diabetes     CBG: Recent Labs  Lab 11/25/20 1621 11/25/20 1929 11/26/20 0023 11/26/20 0420 11/26/20 0804  GLUCAP 212* 260* 243* 266* 311*     Critical care time:    Performed by: Johnsie Cancel  Total critical care time: 38 minutes  Critical care time was exclusive of separately billable procedures and treating other patients.  Critical care was necessary to treat or prevent imminent or life-threatening deterioration.  Critical care was time spent personally by me on the following activities: development of treatment plan with patient and/or surrogate as well as nursing, discussions with consultants, evaluation of patient's response to treatment, examination of patient, obtaining history from patient or surrogate, ordering and performing treatments and interventions, ordering and review of laboratory studies, ordering and review of radiographic studies, pulse oximetry and re-evaluation of patient's condition.  Johnsie Cancel, NP-C [facility] Personal contact information can be found on Amion  If no response please page: Adult pulmonary and critical care medicine pager on Amion unitl 7pm After 7pm please call [phone] 11/26/2020, 10:11 AM

## 2020-11-26 NOTE — Progress Notes (Signed)
Transitions of Care Team following this patient for discharge planning.  Currently, patient not medically stable; will continue to follow as patient progresses.    Eri Platten LCSW  

## 2020-11-26 NOTE — Progress Notes (Signed)
Initial Nutrition Assessment  DOCUMENTATION CODES:   Not applicable  INTERVENTION:   Initiate tube feeding via OG tube: Osmolite 1.5 at 45 ml/h (1080 ml per day) Prosource TF 45 ml TID  Provides 1740 kcal, 100 gm protein, 820 ml free water daily   NUTRITION DIAGNOSIS:   Inadequate oral intake related to inability to eat as evidenced by NPO status.  GOAL:   Patient will meet greater than or equal to 90% of their needs  MONITOR:   TF tolerance  REASON FOR ASSESSMENT:   Consult,Ventilator Enteral/tube feeding initiation and management  ASSESSMENT:   Pt with PMH of HTN, HLD, DM, melanoma, and recent COVID positive with new acute onset of R hemiparesis and aphasia now admitted with L MCA ischemic stroke out of the window for tPA s/p IR achieving TICI 2b revascularization.    Pt discussed during ICU rounds and with RN.  Pt currently COVID positive. No family present.   Patient is currently intubated on ventilator support MV: 9.7 L/min Temp (24hrs), Avg:100.7 F (38.2 C), Min:98.1 F (36.7 C), Max:102.6 F (39.2 C)  Medications reviewed and include: decadron, lasix, SSI, novolog, levemir, KCl  Remdesivir Cleviprex @ 24 ml/hr provides: 1152 kcal  Labs reviewed:  CBG's: 583-094   OG tube; per xray in good position   Diet Order:   Diet Order            Diet NPO time specified  Diet effective now                 EDUCATION NEEDS:   No education needs have been identified at this time  Skin:  Skin Assessment: Reviewed RN Assessment  Last BM:  2/27  Height:   Ht Readings from Last 1 Encounters:  11/24/20 5\' 4"  (1.626 m)    Weight:   Wt Readings from Last 1 Encounters:  11/26/20 71.2 kg    Ideal Body Weight:  54.4 kg  BMI:  Body mass index is 26.94 kg/m.  Estimated Nutritional Needs:   Kcal:  1800  Protein:  90-110 grams  Fluid:  > 1.8 L/day  Lockie Pares., RD, LDN, CNSC See AMiON for contact information

## 2020-11-26 NOTE — Progress Notes (Signed)
[place] for Heparin Indication: RLE DVT  Allergies  Allergen Reactions  . Morphine And Related Nausea And Vomiting    Patient Measurements: Height: 5\' 4"  (162.6 cm) Weight: 71.2 kg (156 lb 15.5 oz) IBW/kg (Calculated) : 54.7 Heparin Dosing Weight: 72 kg  Vital Signs: Temp: 99.8 F (37.7 C) (02/28 1200) Temp Source: Axillary (02/28 0800) BP: 155/62 (02/28 1230) Pulse Rate: 78 (02/28 1230)  Labs: Recent Labs    11/24/20 1602 11/24/20 1604 11/24/20 2036 11/24/20 2058 11/25/20 6579 11/25/20 0932 11/25/20 1317 11/25/20 2212 11/26/20 0413 11/26/20 0834 11/26/20 1051  HGB 11.7* 11.9* 10.9* 11.9*  --   --   --   --   --   --   --   HCT 37.5 35.0* 32.0* 40.3  --   --   --   --   --   --   --   PLT 403*  --   --  136*  --   --   --   --   --   --   --   APTT  --   --   --   --   --   --   --   --   --   --  51*  LABPROT SPECIMEN CLOTTED  --   --   --   --   --   --   --   --   --   --   INR SPECIMEN CLOTTED  --   --   --   --   --   --   --   --   --   --   HEPARINUNFRC  --   --   --   --   --   --   --  <0.10*  --  <0.10*  --   CREATININE 0.75 0.60  --   --  0.89  --   --   --  0.72  --   --   TROPONINIHS  --   --   --   --  6 5 6   --   --   --   --     Estimated Creatinine Clearance: 64.2 mL/min (by C-G formula based on SCr of 0.72 mg/dL).   Assessment: 64 YOF who presented on 2/26 with R-hemiparesis, L-gaze, and aphasia found to have L-M2 branch occlusion, now s/p thrombectomy on 2/26 by neuro-IR. COVID+ and found via dopplers on 2/27 to have a RLE DVT. Pharmacy consulted to dose IV heparin for anticoagulation, no bolus and low goal with concurrent CVA.   Lab unable to process heparin level because blood contains too much lipid.  Currently off Cleviprex and Propofol.  APTT sub-therapeutic at 51 sec; no issue with heparin infusion nor bleeding per RN.  Goal of Therapy:  Heparin level 0.3-0.5 units/ml  APTT 66-85 sec Monitor  platelets by anticoagulation protocol: Yes   Plan:  Increase heparin gtt to 1350 units/hr Check 6 hr aPTT and heparin level (if can use) Daily heparin level and CBC  Alajia Schmelzer D. Mina Marble, PharmD, BCPS, [place] 11/26/2020, 1:24 PM

## 2020-11-26 NOTE — Progress Notes (Signed)
STROKE TEAM PROGRESS NOTE   INTERVAL HISTORY  Son at bedside.  Updated him on patient's condition. Febrile yesterday to 102.6.  Blood cultures ordered and pending results. Will send CBC today.  Lower extremity ultrasound obtained yesterday.  Acute deep vein thrombosis involving the right peroneal veins now on iv heparin infusion.  Neurological exam remains unchanged with patient aphasic and following only few simple commands with mild right hemiparesis.  Vital signs stable.  Echocardiogram shows ejection fraction of 50 to 55%.      Vitals:   11/26/20 1139 11/26/20 1140 11/26/20 1200 11/26/20 1230  BP: (!) 138/57  (!) 144/60 (!) 155/62  Pulse: 73  74 78  Resp: 16  20 18   Temp:   99.8 F (37.7 C)   TempSrc:      SpO2: 96% 97% 95% 96%  Weight:      Height:       CBC:  Recent Labs  Lab 11/24/20 1602 11/24/20 1604 11/24/20 2058 11/26/20 1051  WBC 5.5  --  5.3 13.8*  NEUTROABS 4.1  --   --  11.0*  HGB 11.7*   < > 11.9* 11.3*  HCT 37.5   < > 40.3 35.4*  MCV 80.6  --  81.6 80.1  PLT 403*  --  136* 576*   < > = values in this interval not displayed.   Basic Metabolic Panel:  Recent Labs  Lab 11/25/20 0608 11/26/20 0413  NA 143 139  K 2.8* 3.9  CL 107 109  CO2 23 15*  GLUCOSE 202* 301*  BUN 9 10  CREATININE 0.89 0.72  CALCIUM 8.1* 8.1*  MG 1.9 2.3  PHOS 2.7 2.7   Lipid Panel:  Recent Labs  Lab 11/25/20 0608 11/26/20 0413  CHOL 139  --   TRIG 418* 312*  HDL 15*  --   CHOLHDL 9.3  --   VLDL UNABLE TO CALCULATE IF TRIGLYCERIDE OVER 400 mg/dL  --   LDLCALC UNABLE TO CALCULATE IF TRIGLYCERIDE OVER 400 mg/dL  --    HgbA1c:  Recent Labs  Lab 11/25/20 0608  HGBA1C 10.6*   Urine Drug Screen: No results for input(s): LABOPIA, COCAINSCRNUR, LABBENZ, AMPHETMU, THCU, LABBARB in the last 168 hours.  Alcohol Level No results for input(s): ETH in the last 168 hours.  IMAGING past 24 hours No results found.  PHYSICAL EXAM  Constitutional: Appears well-developed and  well-nourished middle-aged Caucasian lady.  Psych: Unable to assess due to untubation however cooperative with examination  Eyes:Normal conjunctiva EENT: No OP obstruction, dry mucous membranes Head: Normocephalic, atraumatic no obvious abnormality Cardiovascular:Extremities warm without edema Respiratory: Effort normal, non-labored breathing GI: Soft. Non-distended.  Skin: WDI  Neuro: Mental Status: Patient is drowsy but easily awakens but likely has receptive aphasia and is following very few midline commands inconsistently.  Moves her left arm and leg purposefully. There is some evidence of right-sided neglect on examination.  Blinks to threat on the left. Pupils are equal, round, and reactive to light with left gaze preference but able to cross midline upon stimulus like light or noise.  Opens eyes to gentle sensory stimulus of face.VII: Partial right facial palsy noted with right mouth and nasolabial fold involvement.  VIII: Hearing is intact to voice X: Phonation intact XI: Head appears midline XII: Patient does not protrude tongue to command.  Motor: Tone is normal. Bulk is normal. Left upper and lower extremity 4/5 strength without pronator drift. Right upper flicker and lower extremity withdraws briskly to painful  stimulus.  Sensory: Sensation unable to test due to cooperation  Cerebellar: Unable to assess with significant weakness on the right upper and lower extremities   ASSESSMENT/PLAN Emily Lozano is a 70 y.o. female Hx of multiple cerebrovascular risk factors presenting with sudden onset of left gaze preference, right facial droop, hemiparesis and expressive aphasia still with left hemispheric dysfunction. CTA head and neck with left M2 occlusion and though outside the window for IV TPA was a candidate for intervention now s/p unsuccessful revascularization, though there was some distal movement of the clot from M2 to M3; TIKI3. Now intubated and mildly sedated but  awake not moving right side or following commands.   Stroke:  left M2 territory infarct embolic secondary to large vessel occlusion Code Stroke CT head Acute infarct involving the left insular cortex.2. Chronic microvascular ischemic changes. ASPECTS is 9 CTA head and neck Patent ophthalmic artery origins. RightA1 segment hypoplasia. Patent ACAs. Patent right MCA. Left M2 segment occlusion. 30-40% distal left CCA narrowing secondary to atheromatous plaque. Mild narrowing of the proximal left subclavian and left vertebral artery origin. Bilateral upper lung nodular/patchy opacities concerning for infectious/inflammatory foci.  CT perfusion no core and 30cc ischemia.  MRI  Acute infarction in the left MCA territory affecting the deep insula and left frontoparietal cortical and subcortical brain. Minimal petechial blood products or susceptibility artifact related to thrombosed vessels. 2D Echo 55 to 60%. The left ventricle has normal function. The left ventricle has no regional wall motion abnormalities, PFO or LVT.  LDL unable to calculate, triglycerides 312  HgbA1c 10.6  VTE prophylaxis - SCDs Tube feeds  aspirin 81 mg daily prior to admission, now on aspirin 325 per tube.  Therapy recommendations:  TBD  Disposition:  TBD  Hypertension  Home meds: Please refer to chart summary.  Stable . SBP goal 120-140 but gradually normalize in 5-7 days . Long-term BP goal normotensive  Hyperlipidemia  Home meds:  Rosuvastatin 10mg , resumed in hospital  LDL UNABLE TO CALCULATE IF TRIGLYCERIDE OVER 400 mg/dL, goal < 70  Order direct LDL  Continue statin at discharge  Diabetes type II uncontrolled  Home meds:  glimepiride (AMARYL) metFORMIN (GLUCOPHAGE)   HgbA1c 10.6, goal < 7.0  Add Levemir 12 units bid  CBGs Recent Labs    11/26/20 0420 11/26/20 0804 11/26/20 1211  GLUCAP 266* 311* 256*      SSI  Other Stroke Risk Factors  Advanced Age >/= 25   former Cigarette  smoker  ETOH use, alcohol level unknown  Substance abuse - UDS:  THC No results found for requested labs within last 26280 hours., Cocaine No results found for requested labs within last 26280 hours.. Patient advised to stop using due to stroke risk.  Obesity, Body mass index is 26.94 kg/m., BMI >/= 30 associated with increased stroke risk, recommend weight loss, diet and exercise as appropriate   Other Active Problems Acute Hypoxic Respiratory Failure/Covid 19 positive/Pneumonia  Requiring mechanical ventialtion due to stroke and possibly pulmonary Covid-19 pneumonia  Requiring high pressure support so unable to extubate  Diuresing today for volume overload per CCM  Continue Remdesivir  Steroids added for symptomatic Covid-19 pneumonia per CCM   fever: tmax 102.6,WBC 13.8  Likely due to Covid-19 pneumonia  Blood cultures pending  acute deep vein thrombosis   involving the right peroneal veins which is at risk for extension and PE and will need anticoagulation. Though the stroke is moderate in size and there is some risk for  hemorrhage, the benefit to anticoagulation outweighs the risk of PE and recommend low intensity, no bolus heparin with close neurologic monitoring - Discussed with Dr. Shearon Stalls from [facility].  Increase heparin gtt to 1350units/hr  Check 6 hr aPTT and heparin level  Daily CBC    Hospital day # 2 I have personally obtained history,examined this patient, reviewed notes, independently viewed imaging studies, participated in medical decision making and plan of care.ROS completed by me personally and pertinent positives fully documented  I have made any additions or clarifications directly to the above note. Agree with note above.  Patient presented with aphasia and right hemiparesis secondary to left M2 occlusion and underwent unsuccessful attempt at mechanical thrombectomy.  She is also Covid positive with peroneal vein thrombosis on lower extremity Dopplers and she  is on IV heparin.  Continue close neurological monitoring and heparin level.  Speech therapy to do swallow eval.  Long discussion at the bedside with patient's son and answered questions.  Discussed with critical care team nurse practitioner.  This patient is critically ill and at significant risk of neurological worsening, death and care requires constant monitoring of vital signs, hemodynamics,respiratory and cardiac monitoring, extensive review of multiple databases, frequent neurological assessment, discussion with family, other specialists and medical decision making of high complexity.I have made any additions or clarifications directly to the above note.This critical care time does not reflect procedure time, or teaching time or supervisory time of PA/NP/Med Resident etc but could involve care discussion time.  I spent 30 minutes of neurocritical care time  in the care of  this patient.      Emily Contras, MD Medical Director [place] Pager: [phone] 11/26/2020 3:36 PM   To contact Stroke Continuity provider, please refer to [email or web address] After hours, contact General Neurology

## 2020-11-27 DIAGNOSIS — U071 COVID-19: Secondary | ICD-10-CM | POA: Diagnosis not present

## 2020-11-27 DIAGNOSIS — I639 Cerebral infarction, unspecified: Secondary | ICD-10-CM | POA: Diagnosis not present

## 2020-11-27 DIAGNOSIS — I82451 Acute embolism and thrombosis of right peroneal vein: Secondary | ICD-10-CM | POA: Diagnosis not present

## 2020-11-27 LAB — CBC
HCT: 34.4 % — ABNORMAL LOW (ref 36.0–46.0)
Hemoglobin: 10.9 g/dL — ABNORMAL LOW (ref 12.0–15.0)
MCH: 24.4 pg — ABNORMAL LOW (ref 26.0–34.0)
MCHC: 31.7 g/dL (ref 30.0–36.0)
MCV: 77.1 fL — ABNORMAL LOW (ref 80.0–100.0)
Platelets: 474 10*3/uL — ABNORMAL HIGH (ref 150–400)
RBC: 4.46 MIL/uL (ref 3.87–5.11)
RDW: 15.6 % — ABNORMAL HIGH (ref 11.5–15.5)
WBC: 11.4 10*3/uL — ABNORMAL HIGH (ref 4.0–10.5)
nRBC: 0 % (ref 0.0–0.2)

## 2020-11-27 LAB — BASIC METABOLIC PANEL
Anion gap: 12 (ref 5–15)
BUN: 16 mg/dL (ref 8–23)
CO2: 22 mmol/L (ref 22–32)
Calcium: 8.2 mg/dL — ABNORMAL LOW (ref 8.9–10.3)
Chloride: 108 mmol/L (ref 98–111)
Creatinine, Ser: 0.77 mg/dL (ref 0.44–1.00)
GFR, Estimated: >60 mL/min (ref >60)
Glucose, Bld: 366 mg/dL — ABNORMAL HIGH (ref 70–99)
Potassium: 3.9 mmol/L (ref 3.5–5.1)
Sodium: 142 mmol/L (ref 135–145)

## 2020-11-27 LAB — APTT: aPTT: 58 seconds — ABNORMAL HIGH (ref 24–36)

## 2020-11-27 LAB — PHOSPHORUS: Phosphorus: 1.7 mg/dL — ABNORMAL LOW (ref 2.5–4.6)

## 2020-11-27 LAB — GLUCOSE, CAPILLARY
Glucose-Capillary: 302 mg/dL — ABNORMAL HIGH (ref 70–99)
Glucose-Capillary: 307 mg/dL — ABNORMAL HIGH (ref 70–99)
Glucose-Capillary: 316 mg/dL — ABNORMAL HIGH (ref 70–99)
Glucose-Capillary: 336 mg/dL — ABNORMAL HIGH (ref 70–99)
Glucose-Capillary: 355 mg/dL — ABNORMAL HIGH (ref 70–99)
Glucose-Capillary: 391 mg/dL — ABNORMAL HIGH (ref 70–99)

## 2020-11-27 LAB — HEPARIN LEVEL (UNFRACTIONATED)
Heparin Unfractionated: <0.1 IU/mL — ABNORMAL LOW (ref 0.30–0.70)
Heparin Unfractionated: 0.31 IU/mL (ref 0.30–0.70)
Heparin Unfractionated: 0.52 IU/mL (ref 0.30–0.70)

## 2020-11-27 LAB — TRIGLYCERIDES: Triglycerides: 222 mg/dL — ABNORMAL HIGH (ref <150)

## 2020-11-27 MED ORDER — INSULIN DETEMIR 100 UNIT/ML ~~LOC~~ SOLN
20.0000 [IU] | Freq: Two times a day (BID) | SUBCUTANEOUS | Status: DC
  Administered 2020-11-27 – 2020-12-01 (×7): 20 [IU] via SUBCUTANEOUS
  Filled 2020-11-27 (×9): qty 0.2

## 2020-11-27 MED ORDER — FUROSEMIDE 10 MG/ML IJ SOLN
40.0000 mg | Freq: Once | INTRAMUSCULAR | Status: AC
  Administered 2020-11-27: 40 mg via INTRAVENOUS
  Filled 2020-11-27: qty 4

## 2020-11-27 MED ORDER — SODIUM PHOSPHATES 45 MMOLE/15ML IV SOLN
25.0000 mmol | Freq: Once | INTRAVENOUS | Status: AC
  Administered 2020-11-27: 25 mmol via INTRAVENOUS
  Filled 2020-11-27: qty 8.33

## 2020-11-27 MED ORDER — INSULIN DETEMIR 100 UNIT/ML ~~LOC~~ SOLN
8.0000 [IU] | SUBCUTANEOUS | Status: AC
  Administered 2020-11-27: 8 [IU] via SUBCUTANEOUS
  Filled 2020-11-27: qty 0.08

## 2020-11-27 NOTE — Progress Notes (Addendum)
[place] for Heparin Indication: RLE DVT  Allergies  Allergen Reactions  . Morphine And Related Nausea And Vomiting    Patient Measurements: Height: 5\' 4"  (162.6 cm) Weight: 71.2 kg (156 lb 15.5 oz) IBW/kg (Calculated) : 54.7 Heparin Dosing Weight: 72 kg  Vital Signs: Temp: 99.9 F (37.7 C) (03/01 1600) Temp Source: Axillary (03/01 1600) BP: 147/66 (03/01 1645) Pulse Rate: 89 (03/01 1645)  Labs: Recent Labs    11/24/20 2058 11/25/20 5852 11/25/20 0932 11/25/20 1317 11/25/20 2212 11/26/20 0413 11/26/20 0834 11/26/20 1051 11/26/20 2034 11/27/20 0324 11/27/20 0701 11/27/20 1544  HGB 11.9*  --   --   --   --   --   --  11.3*  --   --  10.9*  --   HCT 40.3  --   --   --   --   --   --  35.4*  --   --  34.4*  --   PLT 136*  --   --   --   --   --   --  576*  --   --  474*  --   APTT  --   --   --   --   --   --   --  51* 57*  --  58*  --   HEPARINUNFRC  --   --   --   --    < >  --    < >  --  0.41 <0.10* 0.31 0.52  CREATININE  --  0.89  --   --   --  0.72  --   --   --   --   --   --   TROPONINIHS  --  6 5 6   --   --   --   --   --   --   --   --    < > = values in this interval not displayed.    Estimated Creatinine Clearance: 64.2 mL/min (by C-G formula based on SCr of 0.72 mg/dL).   Assessment: 33 YOF who presented on 2/26 with R-hemiparesis, L-gaze, and aphasia found to have L-M2 branch occlusion, now s/p thrombectomy on 2/26 by neuro-IR. COVID+ and found via dopplers on 2/27 to have a RLE DVT. Pharmacy consulted to dose IV heparin for anticoagulation, no bolus and low goal with concurrent CVA.   Confirmatory heparin level slightly high for lower goal at 0.52. CBC stable. No bleeding or issues with infusion per discussion with RN.  Goal of Therapy:  Heparin level 0.3-0.5 units/ml  Monitor platelets by anticoagulation protocol: Yes   Plan:  Reduce heparin IV slightly to 1450 units/hr Monitor daily heparin level/CBC,  s/sx bleeding    Arturo Morton, PharmD, BCPS Please check AMION for all [place] contact numbers Clinical Pharmacist 11/27/2020 5:08 PM

## 2020-11-27 NOTE — Progress Notes (Signed)
NAME:  Emily Lozano, MRN:  [id number], DOB:  December 13, 1950, LOS: 3 ADMISSION DATE:  11/24/2020, CONSULTATION DATE:  11/24/2020  REFERRING MD:  Rory Percy - neuro, CHIEF COMPLAINT:  L M2 CVA, Covid infection, respiratory failure  Brief History:  70 yo admitted 2/26 with L M2 occlusion, outside of tpa window. Went to [facility] for thrombectomy, unsuccessful, c/b bleed.Resulted + for Covid in NIR, will remain intubated. Reportedly has had some mild URI sx x 1-2 wks (had tested negative OP in this time frame). hypertension, hyperlipidemia, type 2 diabetes mellitus, depression, GERD, melanoma, sleep apnea, and recent exposure to COVID-19  History of Present Illness:  70 yo F PMH HTN HLD Dm2 who presented as a code stroke 2/26 with acute R hemiparesis, L gaze preference, aphasia. KWN 10:30 AM -- outside of window for tpa. CTA head reveals L M2 occlusion, CTA cp with L MCA ischemia. Taken to [facility] for thrombectomy -- c/b bleed, thrombectomy unsuccessful.In NIR, COVID-19 resulted positive. Reportedly, pts family had +Covid recently, pt had URI sx and was tested for COVID 2/25 - and was negative, started on ZPack at that time.  Repeat neuroimaging is pending during [place] will be to remain intubated following case and transfer to ICU. PCCM consulted in this setting   Past Medical History:  HTN HLD DM2   Significant Hospital Events:  11/24/2020 - admit => Left distal M2/MCA middle division branch occlusion vi RCFA. : There was a left M2/MCA middle division branch occlusion with likely atherosclerotic plaque underlying. Two direct contact aspiration passes performed with recanalization of the proximal aspect of the M2 branch and persistent distal occlusion. Two passes were then performed with stent retriever with persistent occlusion. Flat panel head CT showed contrast extravasation in the left sylvian fissure. Delayed angiograms and delayed flat panel CT showed no evidence of active extravasation.  Consults:   11/24/2020  - CCM  Procedures:  2/26 - see events  Significant Diagnostic Tests:  2.26 - see events  Micro Data:  11/24/2020 - covid +  Antimicrobials:  2/26 - remdesivir  Interim History / Subjective:  Awake today.  Tolerating pressure support 5/5  Objective   Blood pressure (!) 139/56, pulse 94, temperature 99.5 F (37.5 C), temperature source Axillary, resp. rate (!) 26, height 5\' 4"  (1.626 m), weight 71.2 kg, SpO2 100 %.    Vent Mode: CPAP;PSV FiO2 (%):  [40 %] 40 % Set Rate:  [14 bmp] 14 bmp Vt Set:  [430 mL] 430 mL PEEP:  [5 cmH20] 5 cmH20 Pressure Support:  [5 cmH20-10 cmH20] 10 cmH20 Plateau Pressure:  [15 cmH20-17 cmH20] 15 cmH20   Intake/Output Summary (Last 24 hours) at 11/27/2020 1009 Last data filed at 11/27/2020 0900 Gross per 24 hour  Intake 3205.37 ml  Output 4120 ml  Net -914.63 ml   Filed Weights   11/24/20 1909 11/26/20 0253  Weight: 80 kg 71.2 kg    Examination: General: Acute on chronically ill appearing elderly female  seen lying in bed on mechanical vent  in NAD HEENT: ETT, MM pink/moist, PERRL,  Neuro: Able to follow simple commands, flicker of movement seen to right upper extremity to command.  Moves left side to command and spontaneously. CV: s1s2 regular rate and rhythm, no murmur, rubs, or gallops, JVP remains elevated at 4 cm above sternal angle. PULM:  Clear to ascultation, no added breath sound, tolerating SBT of 5/5 currently  GI: soft, bowel sounds active in all 4 quadrants, non-tender, non-distended, tolerating  TF Extremities: warm/dry, no edema  Skin: no rashes or lesions  Resolved Hospital Problem list     Assessment & Plan:   Acute ischemic stroke Feb 26th, Left M2 MCA.  Critically ill due to Hypertensive Emergency requiring titration of cleviprex.  Critically ill due to acute respiratory failure post op due to stroke in setting of covid requiring mechanical ventilation COVID 19 positive, sympomatic prior to admission Acute  lower extremity DVT DM on Metformin and Amaryl at home History of Rheumatoid arthritis Immune suppressed  Depression/Anxiety   Plan:  -SBT for 2 hours then extubate - Wean Cleviprex to off -Secondary stroke prevention and rehabilitation -Continue treatment for COVID-19 -Diurese again today.  Best practice (evaluated daily)  Diet: NPO, swallow evaluation once extubated Pain/Anxiety/Delirium protocol (if indicated): Off all sedation VAP protocol (if indicated): yes DVT prophylaxis: scd + IV heparin due to recent DVT GI prophylaxis: ppi Glucose control: poorly controlled due to steroid induced will increase insulin detemir Mobility: PT OT. Disposition: ICU  Family update: Updated son at bedside morning of 2/28  Goals of Care:  Per primary   Labs   CBC: Recent Labs  Lab 11/24/20 1602 11/24/20 1604 11/24/20 2036 11/24/20 2058 11/26/20 1051 11/27/20 0701  WBC 5.5  --   --  5.3 13.8* 11.4*  NEUTROABS 4.1  --   --   --  11.0*  --   HGB 11.7* 11.9* 10.9* 11.9* 11.3* 10.9*  HCT 37.5 35.0* 32.0* 40.3 35.4* 34.4*  MCV 80.6  --   --  81.6 80.1 77.1*  PLT 403*  --   --  136* 576* 474*    Basic Metabolic Panel: Recent Labs  Lab 11/24/20 1602 11/24/20 1604 11/24/20 2036 11/25/20 0608 11/26/20 0413 11/27/20 0701  NA 137 141 144 143 139  --   K 3.7 3.8 2.9* 2.8* 3.9  --   CL 105 106  --  107 109  --   CO2 19*  --   --  23 15*  --   GLUCOSE 285* 288*  --  202* 301*  --   BUN 13 16  --  9 10  --   CREATININE 0.75 0.60  --  0.89 0.72  --   CALCIUM 8.6*  --   --  8.1* 8.1*  --   MG  --   --   --  1.9 2.3  --   PHOS  --   --   --  2.7 2.7 1.7*   GFR: Estimated Creatinine Clearance: 64.2 mL/min (by C-G formula based on SCr of 0.72 mg/dL). Recent Labs  Lab 11/24/20 1602 11/24/20 2058 11/25/20 0544 11/25/20 0608 11/26/20 1051 11/27/20 0701  PROCALCITON  --  <0.10  --  <0.10  --   --   WBC 5.5 5.3  --   --  13.8* 11.4*  LATICACIDVEN  --  2.4* 2.5*  --   --   --      Liver Function Tests: Recent Labs  Lab 11/24/20 1602 11/25/20 0608  AST 18 17  ALT 13 14  ALKPHOS 55 55  BILITOT 0.7 <0.1*  PROT 6.9 6.3*  ALBUMIN 2.9* 2.5*   No results for input(s): LIPASE, AMYLASE in the last 168 hours. No results for input(s): AMMONIA in the last 168 hours.  ABG    Component Value Date/Time   PHART 7.418 11/24/2020 2036   PCO2ART 37.4 11/24/2020 2036   PO2ART 97 11/24/2020 2036   HCO3 24.1 11/24/2020 2036  TCO2 25 11/24/2020 2036   O2SAT 98.0 11/24/2020 2036     Coagulation Profile: Recent Labs  Lab 11/24/20 1602  INR SPECIMEN CLOTTED    Cardiac Enzymes: No results for input(s): CKTOTAL, CKMB, CKMBINDEX, TROPONINI in the last 168 hours.  HbA1C: Hgb A1c MFr Bld  Date/Time Value Ref Range Status  11/25/2020 06:08 AM 10.6 (H) 4.8 - 5.6 % Final    Comment:    (NOTE) Pre diabetes:          5.7%-6.4%  Diabetes:              >6.4%  Glycemic control for   <7.0% adults with diabetes   11/24/2020 08:58 PM 10.4 (H) 4.8 - 5.6 % Final    Comment:    (NOTE) Pre diabetes:          5.7%-6.4%  Diabetes:              >6.4%  Glycemic control for   <7.0% adults with diabetes     CBG: Recent Labs  Lab 11/26/20 1211 11/26/20 1602 11/26/20 2010 11/26/20 2359 11/27/20 0357  GLUCAP 256* 310* 355* 391* 302*     Critical care time:    Performed by: Kipp Brood  Total critical care time: 40 minutes  Critical care time was exclusive of separately billable procedures and treating other patients.  Critical care was necessary to treat or prevent imminent or life-threatening deterioration.  Critical care was time spent personally by me on the following activities: development of treatment plan with patient and/or surrogate as well as nursing, discussions with consultants, evaluation of patient's response to treatment, examination of patient, obtaining history from patient or surrogate, ordering and performing treatments and interventions,  ordering and review of laboratory studies, ordering and review of radiographic studies, pulse oximetry and re-evaluation of patient's condition.  Kipp Brood, MD [facility] ICU Physician [place]  Pager: [phone] Or Epic Secure Chat After hours: [phone].  11/27/2020, 10:30 AM      11/27/2020, 10:09 AM

## 2020-11-27 NOTE — Progress Notes (Signed)
NIR.  History of acute CVA s/p cerebral arteriogram with emergent mechanical thrombectomy of left MCA M2 occlusion achieving a TICI 2b revascularization via right femoral approach 11/24/2020 by Dr. Karenann Cai.  Plan to follow-up with Dr. Karenann Cai in clinic 1 month after discharge (NIR schedulers to call patient to set up this appointment). Further plans per neurology/CCM- appreciate and agree with management. Please call NIR with questions/concerns.   Bea Graff Hillery Bhalla, PA-C 11/27/2020, 9:28 AM

## 2020-11-27 NOTE — Progress Notes (Signed)
SLP Cancellation Note  Patient Details Name: Emily Lozano MRN: [id number] DOB: 1951-05-02   Cancelled treatment:       Reason Eval/Treat Not Completed: Patient not medically ready (remains on vent). Will f/u for cognitive evaluation as able.   Osie Bond., M.A. [facility] Pager [phone] Office [phone]  11/27/2020, 7:54 AM

## 2020-11-27 NOTE — Progress Notes (Signed)
Inpatient Diabetes Program Recommendations  AACE/ADA: New Consensus Statement on Inpatient Glycemic Control (2015)  Target Ranges:  Prepandial:   less than 140 mg/dL      Peak postprandial:   less than 180 mg/dL (1-2 hours)      Critically ill patients:  140 - 180 mg/dL   Lab Results  Component Value Date   GLUCAP 302 (H) 11/27/2020   HGBA1C 10.6 (H) 11/25/2020    Review of Glycemic Control Results for [name], Emily Lozano (MRN [id number]) as of 11/27/2020 10:04  Ref. Range 11/26/2020 12:11 11/26/2020 16:02 11/26/2020 20:10 11/26/2020 23:59 11/27/2020 03:57  Glucose-Capillary Latest Ref Range: 70 - 99 mg/dL 256 (H) 310 (H) 355 (H) 391 (H) 302 (H)   Diabetes history: DM 2 Outpatient Diabetes medications:  Amaryl 4 mg daily Metformin 500 mg bid Current orders for Inpatient glycemic control:  Osmolite 45 ml/hr Novolog resistant q 4 hours Levemir 12 units bid Novolog 5 units q 4 hours Decadron 6 mg daily  Inpatient Diabetes Program Recommendations:   Blood sugars > goal. May need IV insulin.  If IV insulin not started, consider increasing Levemir to 20 units bid and increase Novolog tube feed coverage to 8 units q 4 hours.   Thanks,  Adah Perl, RN, BC-ADM Inpatient Diabetes Coordinator Pager [phone] (8a-5p)

## 2020-11-27 NOTE — Progress Notes (Signed)
Inpatient Rehab Admissions Coordinator Note:   Per therapy recommendations, pt was screened for CIR candidacy by Shann Medal, PT, DPT.  Noted COVID + 2/26. Patients are eligible to be considered for admit to the [facility] when cleared from airborne precautions by acute MD or Infectious disease regardless of onset day.  Also note pt on the vent, so will follow from a distance for progress once extubated to see if she would be a candidate once covid precautions lifted.   Please contact me with questions.   Shann Medal, PT, DPT [phone] 11/27/20 12:44 PM

## 2020-11-27 NOTE — Progress Notes (Signed)
[place] for Heparin Indication: RLE DVT  Allergies  Allergen Reactions  . Morphine And Related Nausea And Vomiting    Patient Measurements: Height: 5\' 4"  (162.6 cm) Weight: 71.2 kg (156 lb 15.5 oz) IBW/kg (Calculated) : 54.7 Heparin Dosing Weight: 72 kg  Vital Signs: Temp: 99.5 F (37.5 C) (03/01 0800) Temp Source: Axillary (03/01 0800) BP: 138/66 (03/01 0800) Pulse Rate: 99 (03/01 0800)  Labs: Recent Labs    11/24/20 1602 11/24/20 1604 11/24/20 2036 11/24/20 2058 11/25/20 0608 11/25/20 0932 11/25/20 1317 11/25/20 2212 11/26/20 0413 11/26/20 0834 11/26/20 1051 11/26/20 2034 11/27/20 0324 11/27/20 0701  HGB 11.7* 11.9*   < > 11.9*  --   --   --   --   --   --  11.3*  --   --  10.9*  HCT 37.5 35.0*   < > 40.3  --   --   --   --   --   --  35.4*  --   --  34.4*  PLT 403*  --   --  136*  --   --   --   --   --   --  576*  --   --  474*  APTT  --   --   --   --   --   --   --   --   --   --  51* 57*  --  58*  LABPROT SPECIMEN CLOTTED  --   --   --   --   --   --   --   --   --   --   --   --   --   INR SPECIMEN CLOTTED  --   --   --   --   --   --   --   --   --   --   --   --   --   HEPARINUNFRC  --   --   --   --   --   --   --    < >  --    < >  --  0.41 <0.10* 0.31  CREATININE 0.75 0.60  --   --  0.89  --   --   --  0.72  --   --   --   --   --   TROPONINIHS  --   --   --   --  6 5 6   --   --   --   --   --   --   --    < > = values in this interval not displayed.    Estimated Creatinine Clearance: 64.2 mL/min (by C-G formula based on SCr of 0.72 mg/dL).   Assessment: 3 YOF who presented on 2/26 with R-hemiparesis, L-gaze, and aphasia found to have L-M2 branch occlusion, now s/p thrombectomy on 2/26 by neuro-IR. COVID+ and found via dopplers on 2/27 to have a RLE DVT. Pharmacy consulted to dose IV heparin for anticoagulation, no bolus and low goal with concurrent CVA.   Lab unable to process heparin level because blood  contains too much lipid on 11/26/20.  Able to run heparin level now and it is therapeutic.  No issue reported.  Goal of Therapy:  Heparin level 0.3-0.5 units/ml  Monitor platelets by anticoagulation protocol: Yes   Plan:  Increase heparin gtt slightly to 1500 units/hr Check 6 hr confirmatory heparin  level Daily heparin level and CBC  Thuy D. Mina Marble, PharmD, BCPS, [place] 11/27/2020, 8:39 AM

## 2020-11-27 NOTE — Progress Notes (Signed)
STROKE TEAM PROGRESS NOTE   INTERVAL HISTORY  No family at bedside today.    Afebrile today.   .  Neurological exam remains show slight improvement with patient followings occasional midline and one-step commands.  Continues to have dense right hemiplegia but moves left side purposefully vital signs stable.  Echocardiogram shows ejection fraction of 50 to 55%.  Plan SBT today then possible extubation.  Swallow evaluation today.       Vitals:   11/27/20 1415 11/27/20 1430 11/27/20 1445 11/27/20 1500  BP: (!) 160/68 (!) 162/71 (!) 164/71 (!) 151/69  Pulse: 81 76 91 87  Resp: (!) 26 (!) 24 (!) 26 (!) 25  Temp:      TempSrc:      SpO2: 98% 99% 99% 99%  Weight:      Height:       CBC:  Recent Labs  Lab 11/24/20 1602 11/24/20 1604 11/26/20 1051 11/27/20 0701  WBC 5.5   < > 13.8* 11.4*  NEUTROABS 4.1  --  11.0*  --   HGB 11.7*   < > 11.3* 10.9*  HCT 37.5   < > 35.4* 34.4*  MCV 80.6   < > 80.1 77.1*  PLT 403*   < > 576* 474*   < > = values in this interval not displayed.   Basic Metabolic Panel:  Recent Labs  Lab 11/25/20 0608 11/26/20 0413 11/27/20 0701  NA 143 139  --   K 2.8* 3.9  --   CL 107 109  --   CO2 23 15*  --   GLUCOSE 202* 301*  --   BUN 9 10  --   CREATININE 0.89 0.72  --   CALCIUM 8.1* 8.1*  --   MG 1.9 2.3  --   PHOS 2.7 2.7 1.7*   Lipid Panel:  Recent Labs  Lab 11/25/20 0608 11/26/20 0413 11/27/20 0324  CHOL 139  --   --   TRIG 418*   < > 222*  HDL 15*  --   --   CHOLHDL 9.3  --   --   VLDL UNABLE TO CALCULATE IF TRIGLYCERIDE OVER 400 mg/dL  --   --   LDLCALC UNABLE TO CALCULATE IF TRIGLYCERIDE OVER 400 mg/dL  --   --    < > = values in this interval not displayed.   HgbA1c:  Recent Labs  Lab 11/25/20 0608  HGBA1C 10.6*   Urine Drug Screen: No results for input(s): LABOPIA, COCAINSCRNUR, LABBENZ, AMPHETMU, THCU, LABBARB in the last 168 hours.  Alcohol Level No results for input(s): ETH in the last 168 hours.  IMAGING past 24 hours No  results found.  PHYSICAL EXAM  Constitutional: Appears well-developed and well-nourished middle-aged Caucasian lady.  Psych: Unable to assess due to untubation however cooperative with examination  Eyes:Normal conjunctiva EENT: No OP obstruction, dry mucous membranes Head: Normocephalic, atraumatic no obvious abnormality Cardiovascular:Extremities warm without edema Respiratory: Effort normal, non-labored breathing GI: Soft. Non-distended.  Skin: WDI  Neurological Exam :  Mental Status: Patient is awake and has receptive aphasia and is following very few midline commands inconsistently.  Moves her left arm and leg purposefully. There is some evidence of right-sided neglect on examination.  Blinks to threat on the left. Pupils are equal, round, and reactive to light with left gaze preference but able to Chiyoko Torrico midline upon stimulus like light or noise.  Opens eyes to gentle sensory stimulus of face.VII: Partial right facial palsy noted with right mouth  and nasolabial fold involvement.  VIII: Hearing is intact to voice X: Phonation intact XI: Head appears midline XII: Patient does not protrude tongue to command.  Motor: Tone is normal. Bulk is normal. Left upper and lower extremity 4/5 strength without pronator drift. Right upper flicker and lower extremity withdraws briskly to painful stimulus.  Sensory: Sensation unable to test due to cooperation  Cerebellar: Unable to assess with significant weakness on the right upper and lower extremities   ASSESSMENT/PLAN [name] is a 70 y.o. female Hx of multiple cerebrovascular risk factors presenting with sudden onset of left gaze preference, right facial droop, hemiparesis and expressive aphasia still with left hemispheric dysfunction. CTA head and neck with left M2 occlusion and though outside the window for IV TPA was a candidate for intervention now s/p unsuccessful revascularization, though there was some distal movement of the  clot from M2 to M3; TIKI3. Now intubated and mildly sedated but awake not moving right side or following commands.  She is also Covid positive with peroneal vein thrombosis on lower extremity Dopplers and she is on IV heparin.  Continue close neurological monitoring and heparin level.     Stroke:  left M2 territory infarct embolic secondary to large vessel occlusion Code Stroke CT head Acute infarct involving the left insular cortex.2. Chronic microvascular ischemic changes. ASPECTS is 9 CTA head and neck Patent ophthalmic artery origins. RightA1 segment hypoplasia. Patent ACAs. Patent right MCA. Left M2 segment occlusion. 30-40% distal left CCA narrowing secondary to atheromatous plaque. Mild narrowing of the proximal left subclavian and left vertebral artery origin. Bilateral upper lung nodular/patchy opacities concerning for infectious/inflammatory foci.  CT perfusion no core and 30cc ischemia.  MRI  Acute infarction in the left MCA territory affecting the deep insula and left frontoparietal cortical and subcortical brain. Minimal petechial blood products or susceptibility artifact related to thrombosed vessels. 2D Echo 55 to 60%. The left ventricle has normal function. The left ventricle has no regional wall motion abnormalities, PFO or LVT.  LDL unable to calculate, triglycerides 312  HgbA1c 10.6  VTE prophylaxis - SCDs Tube feeds  aspirin 81 mg daily prior to admission, now on aspirin 325 per tube.  Therapy recommendations:  pending  Disposition:  TBD  Hypertension  Home meds: Please refer to chart summary.  Stable on cleviprex, wean to off . Change SBP goal <160 but gradually normalize in 5-7 days . Hydralazine prn . Long-term BP goal normotensive  Hyperlipidemia  Home meds:  Rosuvastatin 10mg , resumed in hospital  LDL goal < 70  Order direct LDL,  Triglycerides 222   Continue statin at discharge  Diabetes type II uncontrolled  Home meds:  glimepiride (AMARYL)  metFORMIN (GLUCOPHAGE)   HgbA1c 10.6, goal < 7.0  Increase Levemir to 20units bid and Novolog 5units q 4  CBGs Recent Labs    11/27/20 0357 11/27/20 0756 11/27/20 1203  GLUCAP 302* 316* 307*      SSI  Other Stroke Risk Factors  Advanced Age >/= 68   former Cigarette smoker  ETOH use, alcohol level unknown  Substance abuse - UDS:  THC No results found for requested labs within last 26280 hours., Cocaine No results found for requested labs within last 26280 hours.. Patient advised to stop using due to stroke risk.  Obesity, Body mass index is 26.94 kg/m., BMI >/= 30 associated with increased stroke risk, recommend weight loss, diet and exercise as appropriate   Other Active Problems Acute Hypoxic Respiratory Failure/Covid 19 positive/Pneumonia  Requiring mechanical ventialtion due to stroke and possibly pulmonary Covid-19 pneumonia  Plan to wean and possible extubate today  Requiring high pressure support so unable to extubate  Diuresing today for volume overload per CCM  Continue Remdesivir  Steroids added for symptomatic Covid-19 pneumonia per CCM   fever: tmax 99.5,WBC 11.4  Likely due to Covid-19 pneumonia  Blood cultures: NGTD  acute deep vein thrombosis   involving the right peroneal veins which is at risk for extension and PE and will need anticoagulation. Though the stroke is moderate in size and there is some risk for hemorrhage, the benefit to anticoagulation outweighs the risk of PE and recommend low intensity, no bolus heparin with close neurologic monitoring - Discussed with Dr. Shearon Stalls from [facility].  Increase heparin gtt to 1500units/hr  Check 6 hr aPTT and heparin level  Daily CBC    Hospital day # 3  I have personally obtained history,examined this patient, reviewed notes, independently viewed imaging studies, participated in medical decision making and plan of care.ROS completed by me personally and pertinent positives fully documented  I have  made any additions or clarifications directly to the above note. Agree with note above.. Continue IV heparin drip for deep vein thrombosis.  Extubate as tolerated per CCM team.  Speech therapy for swallow eval after extubation.  No family available at bedside for discussion today.  Discussed with critical care team. This patient is critically ill and at significant risk of neurological worsening, death and care requires constant monitoring of vital signs, hemodynamics,respiratory and cardiac monitoring, extensive review of multiple databases, frequent neurological assessment, discussion with family, other specialists and medical decision making of high complexity.I have made any additions or clarifications directly to the above note.This critical care time does not reflect procedure time, or teaching time or supervisory time of PA/NP/Med Resident etc but could involve care discussion time.  I spent 30 minutes of neurocritical care time  in the care of  this patient.     Antony Contras, MD Medical Director [facility] Pager: [phone] 11/27/2020 3:40 PM   To contact Stroke Continuity provider, please refer to [email or web address] After hours, contact General Neurology

## 2020-11-28 DIAGNOSIS — U071 COVID-19: Secondary | ICD-10-CM | POA: Diagnosis not present

## 2020-11-28 DIAGNOSIS — I639 Cerebral infarction, unspecified: Secondary | ICD-10-CM | POA: Diagnosis not present

## 2020-11-28 DIAGNOSIS — I82451 Acute embolism and thrombosis of right peroneal vein: Secondary | ICD-10-CM | POA: Diagnosis not present

## 2020-11-28 LAB — CBC WITH DIFFERENTIAL/PLATELET
Abs Immature Granulocytes: 0.27 10*3/uL — ABNORMAL HIGH (ref 0.00–0.07)
Basophils Absolute: 0.1 10*3/uL (ref 0.0–0.1)
Basophils Relative: 1 %
Eosinophils Absolute: 0 10*3/uL (ref 0.0–0.5)
Eosinophils Relative: 0 %
HCT: 33.5 % — ABNORMAL LOW (ref 36.0–46.0)
Hemoglobin: 10.8 g/dL — ABNORMAL LOW (ref 12.0–15.0)
Immature Granulocytes: 3 %
Lymphocytes Relative: 10 %
Lymphs Abs: 1 10*3/uL (ref 0.7–4.0)
MCH: 24.8 pg — ABNORMAL LOW (ref 26.0–34.0)
MCHC: 32.2 g/dL (ref 30.0–36.0)
MCV: 76.8 fL — ABNORMAL LOW (ref 80.0–100.0)
Monocytes Absolute: 0.8 10*3/uL (ref 0.1–1.0)
Monocytes Relative: 7 %
Neutro Abs: 8.8 10*3/uL — ABNORMAL HIGH (ref 1.7–7.7)
Neutrophils Relative %: 79 %
Platelets: 481 10*3/uL — ABNORMAL HIGH (ref 150–400)
RBC: 4.36 MIL/uL (ref 3.87–5.11)
RDW: 16 % — ABNORMAL HIGH (ref 11.5–15.5)
WBC: 11 10*3/uL — ABNORMAL HIGH (ref 4.0–10.5)
nRBC: 0 % (ref 0.0–0.2)

## 2020-11-28 LAB — BASIC METABOLIC PANEL
Anion gap: 11 (ref 5–15)
BUN: 18 mg/dL (ref 8–23)
CO2: 23 mmol/L (ref 22–32)
Calcium: 8.5 mg/dL — ABNORMAL LOW (ref 8.9–10.3)
Chloride: 110 mmol/L (ref 98–111)
Creatinine, Ser: 0.68 mg/dL (ref 0.44–1.00)
GFR, Estimated: >60 mL/min (ref >60)
Glucose, Bld: 283 mg/dL — ABNORMAL HIGH (ref 70–99)
Potassium: 3.3 mmol/L — ABNORMAL LOW (ref 3.5–5.1)
Sodium: 144 mmol/L (ref 135–145)

## 2020-11-28 LAB — PHOSPHORUS: Phosphorus: 2.5 mg/dL (ref 2.5–4.6)

## 2020-11-28 LAB — MAGNESIUM: Magnesium: 2.1 mg/dL (ref 1.7–2.4)

## 2020-11-28 LAB — QUANTIFERON-TB GOLD PLUS (RQFGPL)
QuantiFERON Mitogen Value: 0.17 IU/mL
QuantiFERON Nil Value: 0.02 IU/mL
QuantiFERON TB1 Ag Value: 0.02 IU/mL
QuantiFERON TB2 Ag Value: 0.04 IU/mL

## 2020-11-28 LAB — GLUCOSE, CAPILLARY
Glucose-Capillary: 241 mg/dL — ABNORMAL HIGH (ref 70–99)
Glucose-Capillary: 252 mg/dL — ABNORMAL HIGH (ref 70–99)
Glucose-Capillary: 254 mg/dL — ABNORMAL HIGH (ref 70–99)
Glucose-Capillary: 286 mg/dL — ABNORMAL HIGH (ref 70–99)
Glucose-Capillary: 291 mg/dL — ABNORMAL HIGH (ref 70–99)
Glucose-Capillary: 316 mg/dL — ABNORMAL HIGH (ref 70–99)

## 2020-11-28 LAB — HEPARIN LEVEL (UNFRACTIONATED): Heparin Unfractionated: 0.43 IU/mL (ref 0.30–0.70)

## 2020-11-28 LAB — LDL CHOLESTEROL, DIRECT: Direct LDL: 70.6 mg/dL (ref 0–99)

## 2020-11-28 LAB — TRIGLYCERIDES: Triglycerides: 294 mg/dL — ABNORMAL HIGH (ref <150)

## 2020-11-28 LAB — QUANTIFERON-TB GOLD PLUS: QuantiFERON-TB Gold Plus: UNDETERMINED — AB

## 2020-11-28 MED ORDER — AMIODARONE LOAD VIA INFUSION
150.0000 mg | Freq: Once | INTRAVENOUS | Status: AC
  Administered 2020-11-28: 150 mg via INTRAVENOUS
  Filled 2020-11-28: qty 83.34

## 2020-11-28 MED ORDER — POTASSIUM CHLORIDE 10 MEQ/100ML IV SOLN
10.0000 meq | INTRAVENOUS | Status: DC
Start: 2020-11-28 — End: 2020-11-28
  Filled 2020-11-28 (×4): qty 100

## 2020-11-28 MED ORDER — DEXAMETHASONE SODIUM PHOSPHATE 10 MG/ML IJ SOLN
10.0000 mg | Freq: Once | INTRAMUSCULAR | Status: AC
  Administered 2020-11-28: 10 mg via INTRAVENOUS
  Filled 2020-11-28: qty 1

## 2020-11-28 MED ORDER — POTASSIUM & SODIUM PHOSPHATES 280-160-250 MG PO PACK
2.0000 | PACK | Freq: Once | ORAL | Status: AC
  Administered 2020-11-28: 2
  Filled 2020-11-28: qty 2

## 2020-11-28 MED ORDER — AMIODARONE HCL IN DEXTROSE 360-4.14 MG/200ML-% IV SOLN
30.0000 mg/h | INTRAVENOUS | Status: DC
  Administered 2020-11-28 – 2020-11-29 (×3): 30 mg/h via INTRAVENOUS
  Filled 2020-11-28 (×6): qty 200

## 2020-11-28 MED ORDER — POTASSIUM CHLORIDE 20 MEQ PO PACK
40.0000 meq | PACK | Freq: Once | ORAL | Status: AC
  Administered 2020-11-28: 40 meq
  Filled 2020-11-28: qty 2

## 2020-11-28 MED ORDER — AMLODIPINE BESYLATE 5 MG PO TABS: 5.0000 mg | ORAL_TABLET | Freq: Every day | ORAL | Status: DC

## 2020-11-28 MED ORDER — INSULIN ASPART 100 UNIT/ML ~~LOC~~ SOLN
8.0000 [IU] | SUBCUTANEOUS | Status: DC
  Administered 2020-11-28 – 2020-11-29 (×5): 8 [IU] via SUBCUTANEOUS

## 2020-11-28 MED ORDER — METOPROLOL TARTRATE 25 MG/10 ML ORAL SUSPENSION
50.0000 mg | Freq: Two times a day (BID) | ORAL | Status: DC
  Administered 2020-11-28: 50 mg
  Filled 2020-11-28 (×2): qty 20

## 2020-11-28 MED ORDER — AMIODARONE HCL IN DEXTROSE 360-4.14 MG/200ML-% IV SOLN
60.0000 mg/h | INTRAVENOUS | Status: AC
  Administered 2020-11-28 (×2): 60 mg/h via INTRAVENOUS
  Filled 2020-11-28: qty 200

## 2020-11-28 MED ORDER — HYDROCHLOROTHIAZIDE 10 MG/ML ORAL SUSPENSION
12.5000 mg | Freq: Every day | ORAL | Status: DC
  Administered 2020-11-28: 13 mg
  Filled 2020-11-28 (×3): qty 1.88

## 2020-11-28 MED ORDER — RACEPINEPHRINE HCL 2.25 % IN NEBU
INHALATION_SOLUTION | RESPIRATORY_TRACT | Status: AC
  Administered 2020-11-28: 0.5 mL
  Filled 2020-11-28: qty 0.5

## 2020-11-28 MED ORDER — RACEPINEPHRINE HCL 2.25 % IN NEBU
0.5000 mL | INHALATION_SOLUTION | Freq: Once | RESPIRATORY_TRACT | Status: DC
  Filled 2020-11-28: qty 0.5

## 2020-11-28 MED ORDER — HYDRALAZINE HCL 20 MG/ML IJ SOLN
20.0000 mg | Freq: Four times a day (QID) | INTRAMUSCULAR | Status: DC | PRN
  Administered 2020-11-30 (×2): 20 mg via INTRAVENOUS
  Filled 2020-11-28 (×2): qty 1

## 2020-11-28 MED ORDER — RACEPINEPHRINE HCL 2.25 % IN NEBU
0.5000 mL | INHALATION_SOLUTION | Freq: Once | RESPIRATORY_TRACT | Status: AC
  Administered 2020-11-28: 0.5 mL via RESPIRATORY_TRACT

## 2020-11-28 MED ORDER — HYDROCHLOROTHIAZIDE 12.5 MG PO CAPS: 12.5000 mg | ORAL_CAPSULE | Freq: Every day | ORAL | Status: DC

## 2020-11-28 NOTE — Progress Notes (Signed)
NAME:  Emily Lozano, MRN:  [id number], DOB:  1950-10-22, LOS: 4 ADMISSION DATE:  11/24/2020, CONSULTATION DATE:  11/24/2020  REFERRING MD:  Rory Percy - neuro, CHIEF COMPLAINT:  L M2 CVA, Covid infection, respiratory failure  Brief History:  70 yo admitted 2/26 with L M2 occlusion, outside of tpa window. Went to [facility] for thrombectomy, unsuccessful, c/b bleed.Resulted + for Covid in NIR, will remain intubated. Reportedly has had some mild URI sx x 1-2 wks (had tested negative OP in this time frame). hypertension, hyperlipidemia, type 2 diabetes mellitus, depression, GERD, melanoma, sleep apnea, and recent exposure to COVID-19  History of Present Illness:  70 yo F PMH HTN HLD Dm2 who presented as a code stroke 2/26 with acute R hemiparesis, L gaze preference, aphasia. KWN 10:30 AM -- outside of window for tpa. CTA head reveals L M2 occlusion, CTA cp with L MCA ischemia. Taken to [facility] for thrombectomy -- c/b bleed, thrombectomy unsuccessful.In NIR, COVID-19 resulted positive. Reportedly, pts family had +Covid recently, pt had URI sx and was tested for COVID 2/25 - and was negative, started on ZPack at that time.  Repeat neuroimaging is pending during [place] will be to remain intubated following case and transfer to ICU. PCCM consulted in this setting   Past Medical History:  HTN HLD DM2   Significant Hospital Events:  11/24/2020 - admit => Left distal M2/MCA middle division branch occlusion vi RCFA. : There was a left M2/MCA middle division branch occlusion with likely atherosclerotic plaque underlying. Two direct contact aspiration passes performed with recanalization of the proximal aspect of the M2 branch and persistent distal occlusion. Two passes were then performed with stent retriever with persistent occlusion. Flat panel head CT showed contrast extravasation in the left sylvian fissure. Delayed angiograms and delayed flat panel CT showed no evidence of active extravasation.  Consults:   11/24/2020  - CCM  Procedures:  2/26 - see events  Significant Diagnostic Tests:  2.26 - see events  Micro Data:  11/24/2020 - covid +  Antimicrobials:  2/26 - remdesivir  Interim History / Subjective:  Overnight: She went in to Atrial fibrillation with RVR for which she was started on amiodarone.   Patient is awake and follow commands  Objective   Blood pressure (!) 142/83, pulse 98, temperature 99.9 F (37.7 C), temperature source Axillary, resp. rate (!) 24, height 5\' 4"  (1.626 m), weight 71.2 kg, SpO2 95 %.    Vent Mode: PRVC FiO2 (%):  [40 %] 40 % Set Rate:  [14 bmp] 14 bmp Vt Set:  [430 mL] 430 mL PEEP:  [5 cmH20] 5 cmH20 Pressure Support:  [5 cmH20-10 cmH20] 10 cmH20 Plateau Pressure:  [15 cmH20-17 cmH20] 17 cmH20   Intake/Output Summary (Last 24 hours) at 11/28/2020 0719 Last data filed at 11/28/2020 0700 Gross per 24 hour  Intake 3317.77 ml  Output 2550 ml  Net 767.77 ml   Filed Weights   11/24/20 1909 11/26/20 0253  Weight: 80 kg 71.2 kg    Examination: Const: In no apparent distress, lying comfortably in bed HEENT: ETT in place Resp: CTA BL, no wheezes, crackles, rhonchi CV: Tachycardic, irregular, no MGR Abd: Bowel sounds present, nondistended, nontender to palpation Ext: No lower extremity edema, skin is warm to touch Neuro: Strength 0/5 on the right upper extremity. Somewhat spontaneous movement of the RUE though barely noticeable. Moves Left UE and LE to command  Resolved Hospital Problem list     Assessment & Plan:  Acute ischemic stroke Feb 26th, Left M2 MCA.  Critically ill due to Hypertensive Emergency requiring titration of cleviprex.  Critically ill due to acute respiratory failure post op due to stroke in setting of covid requiring mechanical ventilation COVID 19 positive, sympomatic prior to admission Acute lower extremity DVT DM on Metformin and Amaryl at home History of Rheumatoid arthritis Immune suppressed  Depression/Anxiety   Atrial Fibrillation (CHA2DS2-VASc) score 5-6  Plan:  -SBT for 2 hours then extubate -Wean Cleviprex to off -Continue treatment for COVID-19 (Remdesvir, Decadron) -Continue amiodarone  -Start oral metoprolol 50mg  BID -Secondary stroke prevention and rehabilitation -Continue heparin for LE DVT  Best practice (evaluated daily)  Diet: NPO, swallow evaluation once extubated Pain/Anxiety/Delirium protocol (if indicated): Off all sedation VAP protocol (if indicated): yes DVT prophylaxis: scd + IV heparin due to recent DVT GI prophylaxis: ppi Glucose control: poorly controlled due to steroid induced will increase insulin detemir Mobility: PT OT. Disposition: ICU  Family update: Updated son at bedside morning of 2/28  Goals of Care:  Per primary   Labs   CBC: Recent Labs  Lab 11/24/20 1602 11/24/20 1604 11/24/20 2036 11/24/20 2058 11/26/20 1051 11/27/20 0701 11/28/20 0215  WBC 5.5  --   --  5.3 13.8* 11.4* 11.0*  NEUTROABS 4.1  --   --   --  11.0*  --  8.8*  HGB 11.7*   < > 10.9* 11.9* 11.3* 10.9* 10.8*  HCT 37.5   < > 32.0* 40.3 35.4* 34.4* 33.5*  MCV 80.6  --   --  81.6 80.1 77.1* 76.8*  PLT 403*  --   --  136* 576* 474* 481*   < > = values in this interval not displayed.    Basic Metabolic Panel: Recent Labs  Lab 11/24/20 1602 11/24/20 1604 11/24/20 2036 11/25/20 0608 11/26/20 0413 11/27/20 0701 11/27/20 1544 11/28/20 0215  NA 137 141 144 143 139  --  142 144  K 3.7 3.8 2.9* 2.8* 3.9  --  3.9 3.3*  CL 105 106  --  107 109  --  108 110  CO2 19*  --   --  23 15*  --  22 23  GLUCOSE 285* 288*  --  202* 301*  --  366* 283*  BUN 13 16  --  9 10  --  16 18  CREATININE 0.75 0.60  --  0.89 0.72  --  0.77 0.68  CALCIUM 8.6*  --   --  8.1* 8.1*  --  8.2* 8.5*  MG  --   --   --  1.9 2.3  --   --   --   PHOS  --   --   --  2.7 2.7 1.7*  --  2.5   GFR: Estimated Creatinine Clearance: 64.2 mL/min (by C-G formula based on SCr of 0.68 mg/dL). Recent Labs  Lab  11/24/20 2058 11/25/20 0544 11/25/20 0608 11/26/20 1051 11/27/20 0701 11/28/20 0215  PROCALCITON <0.10  --  <0.10  --   --   --   WBC 5.3  --   --  13.8* 11.4* 11.0*  LATICACIDVEN 2.4* 2.5*  --   --   --   --     Liver Function Tests: Recent Labs  Lab 11/24/20 1602 11/25/20 0608  AST 18 17  ALT 13 14  ALKPHOS 55 55  BILITOT 0.7 <0.1*  PROT 6.9 6.3*  ALBUMIN 2.9* 2.5*   No results for input(s): LIPASE, AMYLASE in the last  168 hours. No results for input(s): AMMONIA in the last 168 hours.  ABG    Component Value Date/Time   PHART 7.418 11/24/2020 2036   PCO2ART 37.4 11/24/2020 2036   PO2ART 97 11/24/2020 2036   HCO3 24.1 11/24/2020 2036   TCO2 25 11/24/2020 2036   O2SAT 98.0 11/24/2020 2036     Coagulation Profile: Recent Labs  Lab 11/24/20 1602  INR SPECIMEN CLOTTED    Cardiac Enzymes: No results for input(s): CKTOTAL, CKMB, CKMBINDEX, TROPONINI in the last 168 hours.  HbA1C: Hgb A1c MFr Bld  Date/Time Value Ref Range Status  11/25/2020 06:08 AM 10.6 (H) 4.8 - 5.6 % Final    Comment:    (NOTE) Pre diabetes:          5.7%-6.4%  Diabetes:              >6.4%  Glycemic control for   <7.0% adults with diabetes   11/24/2020 08:58 PM 10.4 (H) 4.8 - 5.6 % Final    Comment:    (NOTE) Pre diabetes:          5.7%-6.4%  Diabetes:              >6.4%  Glycemic control for   <7.0% adults with diabetes     CBG: Recent Labs  Lab 11/27/20 1203 11/27/20 1538 11/27/20 2108 11/28/20 0032 11/28/20 0409  GLUCAP 307* 336* 355* 291* 252*     Critical care time:    Performed by: Jean Rosenthal

## 2020-11-28 NOTE — Progress Notes (Signed)
Called to pts room by RN due to stridor getting worse. Racepinephrine tx given. Pt placed on BIPAP 09/04/39% per MD order. Pt is tolerating well at this time. RT to continue to monitor.

## 2020-11-28 NOTE — Progress Notes (Signed)
[facility] for Heparin Indication: RLE DVT  Allergies  Allergen Reactions  . Morphine And Related Nausea And Vomiting    Patient Measurements: Height: 5\' 4"  (162.6 cm) Weight: 71.2 kg (156 lb 15.5 oz) IBW/kg (Calculated) : 54.7 Heparin Dosing Weight: 72 kg  Vital Signs: Temp: 99.1 F (37.3 C) (03/02 0800) Temp Source: Axillary (03/02 0800) BP: 167/90 (03/02 0800) Pulse Rate: 130 (03/02 0800)  Labs: Recent Labs    11/25/20 0932 11/25/20 1317 11/25/20 2212 11/26/20 0413 11/26/20 0834 11/26/20 1051 11/26/20 2034 11/27/20 0324 11/27/20 0701 11/27/20 1544 11/28/20 0215  HGB  --   --   --   --    < > 11.3*  --   --  10.9*  --  10.8*  HCT  --   --   --   --   --  35.4*  --   --  34.4*  --  33.5*  PLT  --   --   --   --   --  576*  --   --  474*  --  481*  APTT  --   --   --   --   --  51* 57*  --  58*  --   --   HEPARINUNFRC  --   --    < >  --    < >  --  0.41   < > 0.31 0.52 0.43  CREATININE  --   --   --  0.72  --   --   --   --   --  0.77 0.68  TROPONINIHS 5 6  --   --   --   --   --   --   --   --   --    < > = values in this interval not displayed.    Estimated Creatinine Clearance: 64.2 mL/min (by C-G formula based on SCr of 0.68 mg/dL).   Assessment: 71 YOF who presented on 2/26 with R-hemiparesis, L-gaze, and aphasia found to have L-M2 branch occlusion, now s/p thrombectomy on 2/26 by neuro-IR. COVID+ and found via dopplers on 2/27 to have a RLE DVT. Pharmacy consulted to dose IV heparin for anticoagulation, no bolus and low goal with concurrent CVA.   Heparin level therapeutic; no bleeding reported.  Goal of Therapy:  Heparin level 0.3-0.5 units/ml  Monitor platelets by anticoagulation protocol: Yes   Plan:  Continue heparin gtt at 1450 units/hr Daily heparin level and CBC  Thuy D. Mina Marble, PharmD, BCPS, [place] 11/28/2020, 8:07 AM

## 2020-11-28 NOTE — Progress Notes (Signed)
Stridor has worsened, patient's sats remain in the upper 90s. Contacted Dr. Lynetta Mare who gave VO to start bipap and racepinepehrine neb x1.  Contacted RT.

## 2020-11-28 NOTE — Progress Notes (Signed)
eLink Physician-Brief Progress Note Patient Name: Emily Lozano DOB: [birth year] MRN: [id number]   Date of Service  11/28/2020  HPI/Events of Note  AFIB with RVR - Ventricular rate = 145. BP = 142/75. K+ = 3.3 and Creatinine = 0.68. Patient is already on Cleviprex which is a Ca++ Channel Blocker, therefore, can't add a Cardizem IV infusion for rate control.   eICU Interventions  Plan: 1. Replace K+. 2. Mg++ level STAT. 3. Amiodarone IV load and infusion.      Intervention Category Major Interventions: Arrhythmia - evaluation and management  Lillymae Duet Eugene 11/28/2020, 6:59 AM

## 2020-11-28 NOTE — Progress Notes (Signed)
Inpatient Diabetes Program Recommendations  AACE/ADA: New Consensus Statement on Inpatient Glycemic Control (2015)  Target Ranges:  Prepandial:   less than 140 mg/dL      Peak postprandial:   less than 180 mg/dL (1-2 hours)      Critically ill patients:  140 - 180 mg/dL   Lab Results  Component Value Date   GLUCAP 254 (H) 11/28/2020   HGBA1C 10.6 (H) 11/25/2020    Review of Glycemic Control Results for Emily Lozano, Emily Lozano (MRN [id number]) as of 11/28/2020 10:10  Ref. Range 11/27/2020 15:38 11/27/2020 21:08 11/28/2020 00:32 11/28/2020 04:09 11/28/2020 07:58  Glucose-Capillary Latest Ref Range: 70 - 99 mg/dL 336 (H) 355 (H) 291 (H) 252 (H) 254 (H)   Diabetes history: DM 2 Outpatient Diabetes medications: Amaryl 4 mg daily Metformin 500 mg bid Current orders for Inpatient glycemic control: Osmolite 45 ml/hr Novolog resistant q 4 hours Levemir 20 units bid Novolog 5 units q 4 hours Decadron 6 mg daily  Inpatient Diabetes Program Recommendations:     Please consider: Novolog 8 units Q4H tube feed coverage.  Stop if feeds held or discontinued.  Will continue to follow while inpatient.  Thank you, Reche Dixon, RN, BSN Diabetes Coordinator Inpatient Diabetes Program [phone] (team pager from 8a-5p)

## 2020-11-28 NOTE — Progress Notes (Signed)
Occupational Therapy Treatment Patient Details Name: Emily Lozano MRN: [id number] DOB: [birth year] Today's Date: 11/28/2020    History of present illness 70 y.o. female with a medical history significant for hypertension, hyperlipidemia, type 2 diabetes mellitus, depression, GERD, melanoma, sleep apnea, and recent exposure to COVID-19 with a negative test at her PCP yesterday but diagnosed with acute bronchitis. Pt presents with acute onset R weakness, aphasia, left gaze. CTA demonstrates L M2 occlusion. Pt underwent thrombectomy on 11/24/2020 (unsuccessful)   OT comments  Pt demonstrates increased arousal and following 25% of 1 step command. Pt completed sit<>stand total +2 max (A) from EOB. Pt with R inattention. Recommendation remains CIR.   Follow Up Recommendations  CIR    Equipment Recommendations  Other (comment);3 in 1 bedside commode;Wheelchair (measurements OT);Wheelchair cushion (measurements OT)    Recommendations for Other Services Rehab consult    Precautions / Restrictions Precautions Precautions: Fall;Other (comment) Precaution Comments: COVID postive       Mobility Bed Mobility Overal bed mobility: Needs Assistance Bed Mobility: Sit to Supine;Supine to Sit Rolling: Max assist;Total assist;+2 for safety/equipment   Supine to sit: Max assist;Total assist;+2 for physical assistance Sit to supine: Max assist;+2 for physical assistance   General bed mobility comments: Pt exitign the bed on the L side of the bed with R inattention. pt with pushing R UE and L lateral lean. Pt max (A) to sustain static sitting    Transfers Overall transfer level: Needs assistance   Transfers: Sit to/from Stand Sit to Stand: Max assist;+2 physical assistance (x2)         General transfer comment: stood at EOB with 2 person assist and blocking R Knee.  pt unable to attain full upright stand with maximal truncal assist    Balance Overall balance assessment: Needs  assistance Sitting-balance support: Feet supported Sitting balance-Leahy Scale: Poor Sitting balance - Comments: pt listed R and posteriorly unless assist to hold midline.  pt tended to grab for rail with L hand and pull left if allowed./ Postural control: Right lateral lean;Posterior lean Standing balance support: During functional activity;Bilateral upper extremity supported Standing balance-Leahy Scale: Poor                             ADL either performed or assessed with clinical judgement   ADL Overall ADL's : Needs assistance/impaired Eating/Feeding: NPO   Grooming: Maximal assistance                                 General ADL Comments: pt incontinence of stool on arrival and lack of awareness. pt with total (A) for hygiene. pt with purewick in place now. pt relaxing after movement and hygiene     Vision       Perception     Praxis      Cognition Arousal/Alertness: Awake/alert Behavior During Therapy: Flat affect Overall Cognitive Status: Difficult to assess                                 General Comments: pt followed 1 step command 25% of session. pt pulling at face mask but stop with hand holding and relaxing. pt with R inattention        Exercises     Shoulder Instructions       General Comments SpO2 with activity on  3L low 90's,  RR 20-36 rpm,  HR was sustained in the 120's,  Bipap replaced after session.    Pertinent Vitals/ Pain       Pain Assessment: No/denies pain Faces Pain Scale: No hurt Pain Intervention(s): Monitored during session  Home Living                                          Prior Functioning/Environment              Frequency  Min 2X/week        Progress Toward Goals  OT Goals(current goals can now be found in the care plan section)  Progress towards OT goals: Progressing toward goals  Acute Rehab OT Goals Patient Stated Goal: Pt unstable to state OT Goal  Formulation: Patient unable to participate in goal setting Time For Goal Achievement: 12/10/20 Potential to Achieve Goals: Good ADL Goals Pt Will Perform Grooming: with mod assist;sitting Pt Will Perform Upper Body Dressing: with mod assist;sitting Pt Will Transfer to Toilet: with mod assist;ambulating;bedside commode;with +2 assist Additional ADL Goal #1: Pt will tolerate sitting at EOB for 10 minutes with Mod A in preparation for ADLs Additional ADL Goal #2: Pt will follow one step commands during ADLs with MIn cues  Plan Discharge plan remains appropriate    Co-evaluation    PT/OT/SLP Co-Evaluation/Treatment: Yes Reason for Co-Treatment: Complexity of the patient's impairments (multi-system involvement);Necessary to address cognition/behavior during functional activity;For patient/therapist safety;To address functional/ADL transfers PT goals addressed during session: Mobility/safety with mobility OT goals addressed during session: ADL's and self-care;Proper use of Adaptive equipment and DME;Strengthening/ROM      AM-PAC OT "6 Clicks" Daily Activity     Outcome Measure   Help from another person eating meals?: Total Help from another person taking care of personal grooming?: Total Help from another person toileting, which includes using toliet, bedpan, or urinal?: Total Help from another person bathing (including washing, rinsing, drying)?: Total Help from another person to put on and taking off regular upper body clothing?: Total Help from another person to put on and taking off regular lower body clothing?: Total 6 Click Score: 6    End of Session Equipment Utilized During Treatment: Oxygen  OT Visit Diagnosis: Unsteadiness on feet (R26.81);Other abnormalities of gait and mobility (R26.89);Muscle weakness (generalized) (M62.81);Pain;Hemiplegia and hemiparesis Hemiplegia - Right/Left: Right Hemiplegia - dominant/non-dominant: Dominant Hemiplegia - caused by: Cerebral  infarction   Activity Tolerance Patient tolerated treatment well   Patient Left in bed;with call bell/phone within reach;with bed alarm set;with nursing/sitter in room;with SCD's reapplied   Nurse Communication Mobility status;Precautions        Time: 6962-9528 OT Time Calculation (min): 32 min  Charges: OT General Charges $OT Visit: 1 Visit OT Treatments $Self Care/Home Management : 8-22 mins   Brynn, OTR/L  Acute Rehabilitation Services Pager: [phone] Office: [phone] .    Jeri Modena 11/28/2020, 3:48 PM

## 2020-11-28 NOTE — Progress Notes (Signed)
RT removed BIPAP and placed patient on 4L [place]. Pt maintained spo2 100% but WOB increased. RR in the 30s and some stridor still present. Patient unable to do inhaler at this time. RT placed patient back on BIPAP. Patient is tolerating BIPAP well at this time. RT will continue to monitor as needed.

## 2020-11-28 NOTE — Progress Notes (Signed)
SLP Cancellation Note  Patient Details Name: Emily Lozano MRN: [id number] DOB: [birth year]   Cancelled treatment:       Reason Eval/Treat Not Completed: Medical issues which prohibited therapy. Pt reains on vent this am, but working on weaning. Will continue to follow.   Osie Bond., M.A. [place] Acute Rehabilitation Services Pager [phone] Office [phone]  11/28/2020, 7:54 AM

## 2020-11-28 NOTE — Progress Notes (Signed)
Stridor noted following extubation and unable to get patient to provide strong cough or verbalization. VO from Dr. Lynetta Mare for racepinephrine neb x1 and decadron 10mg  IVP x1.  Will continue to monitor.

## 2020-11-28 NOTE — Progress Notes (Signed)
Physical Therapy Treatment Patient Details Name: Emily Lozano MRN: [id number] DOB: [birth year] Today's Date: 11/28/2020    History of Present Illness 70 y.o. female with a medical history significant for hypertension, hyperlipidemia, type 2 diabetes mellitus, depression, GERD, melanoma, sleep apnea, and recent exposure to COVID-19 with a negative test at her PCP yesterday but diagnosed with acute bronchitis. Pt presents with acute onset R weakness, aphasia, left gaze. CTA demonstrates L M2 occlusion. Pt underwent thrombectomy on 11/24/2020 (unsuccessful)    PT Comments    Pt becoming more alert and mildly more participative.  Emphasis on bed mobility, transitions, sitting balance, sit to stand and eliciting responses, following direction.    Follow Up Recommendations  CIR;Supervision/Assistance - 24 hour     Equipment Recommendations  Other (comment) (TBA)    Recommendations for Other Services Rehab consult     Precautions / Restrictions Precautions Precautions: Fall;Other (comment) Precaution Comments: COVID postive    Mobility  Bed Mobility Overal bed mobility: Needs Assistance Bed Mobility: Sit to Supine;Supine to Sit Rolling: Max assist;Total assist;+2 for safety/equipment   Supine to sit: Max assist;Total assist;+2 for physical assistance Sit to supine: Max assist;+2 for physical assistance   General bed mobility comments: Pt exitign the bed on the L side of the bed with R inattention. pt with pushing R UE and L lateral lean. Pt max (A) to sustain static sitting    Transfers Overall transfer level: Needs assistance   Transfers: Sit to/from Stand Sit to Stand: Max assist;+2 physical assistance (x2)         General transfer comment: stood at EOB with 2 person assist and blocking R Knee.  pt unable to attain full upright stand with maximal truncal assist  Ambulation/Gait                 Stairs             Wheelchair Mobility    Modified  Rankin (Stroke Patients Only) Modified Rankin (Stroke Patients Only) Pre-Morbid Rankin Score: No symptoms Modified Rankin: Severe disability     Balance Overall balance assessment: Needs assistance Sitting-balance support: Feet supported Sitting balance-Leahy Scale: Poor Sitting balance - Comments: pt listed R and posteriorly unless assist to hold midline.  pt tended to grab for rail with L hand and pull left if allowed./ Postural control: Right lateral lean;Posterior lean Standing balance support: During functional activity;Bilateral upper extremity supported Standing balance-Leahy Scale: Poor                              Cognition Arousal/Alertness: Awake/alert Behavior During Therapy: Flat affect Overall Cognitive Status: Difficult to assess                                 General Comments: pt followed 1 step command 25% of session. pt pulling at face mask but stop with hand holding and relaxing. pt with R inattention      Exercises      General Comments General comments (skin integrity, edema, etc.): SpO2 with activity on 3L low 90's,  RR 20-36 rpm,  HR was sustained in the 120's,  Bipap replaced after session.      Pertinent Vitals/Pain Pain Assessment: No/denies pain Faces Pain Scale: No hurt Pain Intervention(s): Monitored during session    Home Living  Prior Function            PT Goals (current goals can now be found in the care plan section) Acute Rehab PT Goals PT Goal Formulation: Patient unable to participate in goal setting Time For Goal Achievement: 12/10/20 Progress towards PT goals: Progressing toward goals    Frequency    Min 3X/week      PT Plan Current plan remains appropriate    Co-evaluation PT/OT/SLP Co-Evaluation/Treatment: Yes Reason for Co-Treatment: Complexity of the patient's impairments (multi-system involvement);Necessary to address cognition/behavior during functional  activity;For patient/therapist safety;To address functional/ADL transfers PT goals addressed during session: Mobility/safety with mobility OT goals addressed during session: ADL's and self-care;Proper use of Adaptive equipment and DME;Strengthening/ROM      AM-PAC PT "6 Clicks" Mobility   Outcome Measure  Help needed turning from your back to your side while in a flat bed without using bedrails?: A Lot Help needed moving from lying on your back to sitting on the side of a flat bed without using bedrails?: A Lot Help needed moving to and from a bed to a chair (including a wheelchair)?: A Lot Help needed standing up from a chair using your arms (e.g., wheelchair or bedside chair)?: A Lot Help needed to walk in hospital room?: Total Help needed climbing 3-5 steps with a railing? : Total 6 Click Score: 10    End of Session   Activity Tolerance: Patient limited by fatigue;Patient tolerated treatment well Patient left: in bed;with call bell/phone within reach;with nursing/sitter in room;with bed alarm set Nurse Communication: Mobility status PT Visit Diagnosis: Hemiplegia and hemiparesis;Other abnormalities of gait and mobility (R26.89) Hemiplegia - Right/Left: Right Hemiplegia - dominant/non-dominant: Dominant Hemiplegia - caused by: Cerebral infarction     Time: 5573-2202 PT Time Calculation (min) (ACUTE ONLY): 32 min  Charges:  $Neuromuscular Re-education: 8-22 mins                     11/28/2020  [name], PT Acute Rehabilitation Services [phone]  (pager) [phone]  (office)   Tessie Fass Naly Schwanz 11/28/2020, 3:48 PM

## 2020-11-28 NOTE — Progress Notes (Signed)
STROKE TEAM PROGRESS NOTE   INTERVAL HISTORY Patient was just extubated this morning.  She remains aphasic and follows only simple midline and few one-step commands.  She has dense right hemiplegia.  Vital signs stable.       Vitals:   11/28/20 1430 11/28/20 1500 11/28/20 1515 11/28/20 1530  BP: (!) 160/86 (!) 165/99 (!) 144/83 (!) 148/86  Pulse:  (!) 130    Resp:  (!) 36    Temp:      TempSrc:      SpO2:  99%    Weight:      Height:       CBC:  Recent Labs  Lab 11/26/20 1051 11/27/20 0701 11/28/20 0215  WBC 13.8* 11.4* 11.0*  NEUTROABS 11.0*  --  8.8*  HGB 11.3* 10.9* 10.8*  HCT 35.4* 34.4* 33.5*  MCV 80.1 77.1* 76.8*  PLT 576* 474* 371*   Basic Metabolic Panel:  Recent Labs  Lab 11/26/20 0413 11/27/20 0701 11/27/20 1544 11/28/20 0215  NA 139  --  142 144  K 3.9  --  3.9 3.3*  CL 109  --  108 110  CO2 15*  --  22 23  GLUCOSE 301*  --  366* 283*  BUN 10  --  16 18  CREATININE 0.72  --  0.77 0.68  CALCIUM 8.1*  --  8.2* 8.5*  MG 2.3  --   --  2.1  PHOS 2.7 1.7*  --  2.5   Lipid Panel:  Recent Labs  Lab 11/25/20 0608 11/26/20 0413 11/28/20 0215  CHOL 139  --   --   TRIG 418*   < > 294*  HDL 15*  --   --   CHOLHDL 9.3  --   --   VLDL UNABLE TO CALCULATE IF TRIGLYCERIDE OVER 400 mg/dL  --   --   LDLCALC UNABLE TO CALCULATE IF TRIGLYCERIDE OVER 400 mg/dL  --   --    < > = values in this interval not displayed.   HgbA1c:  Recent Labs  Lab 11/25/20 0608  HGBA1C 10.6*   Urine Drug Screen: No results for input(s): LABOPIA, COCAINSCRNUR, LABBENZ, AMPHETMU, THCU, LABBARB in the last 168 hours.  Alcohol Level No results for input(s): ETH in the last 168 hours.  IMAGING past 24 hours No results found.  PHYSICAL EXAM Pleasant elderly Caucasian lady.  She is has mild upper airway sounds and labored respiration. . Afebrile. Head is nontraumatic. Neck is supple without bruit.    Cardiac exam no murmur or gallop. Lungs are clear to auscultation. Distal pulses  are well felt.   Neurological Exam :  Mental Status: Patient is awake and has receptive aphasia and is following very few midline commands inconsistently.  Moves her left arm and leg purposefully. There is some evidence of right-sided neglect on examination.  Blinks to threat on the left. Pupils are equal, round, and reactive to light with left gaze preference but able to cross midline upon stimulus like light or noise.  Opens eyes to gentle sensory stimulus of face.VII: Partial right facial palsy noted with right mouth and nasolabial fold involvement.  VIII: Hearing is intact to voice X: Phonation intact XI: Head appears midline XII: Patient does not protrude tongue to command.  Motor: Tone is normal. Bulk is normal. Left upper and lower extremity 4/5 strength without pronator drift. Right upper flicker and lower extremity withdraws briskly to painful stimulus.  Sensory: Sensation unable to test due to cooperation  Cerebellar: Unable to assess with significant weakness on the right upper and lower extremities   ASSESSMENT/PLAN Emily Lozano is a 70 y.o. female Hx of multiple cerebrovascular risk factors presenting with sudden onset of left gaze preference, right facial droop, hemiparesis and expressive aphasia still with left hemispheric dysfunction. CTA head and neck with left M2 occlusion and though outside the window for IV TPA was a candidate for intervention now s/p unsuccessful revascularization, though there was some distal movement of the clot from M2 to M3; TIKI3. Now intubated and mildly sedated but awake not moving right side or following commands.  She is also Covid positive with peroneal vein thrombosis on lower extremity Dopplers and she is on IV heparin.  Continue close neurological monitoring and heparin level.     Stroke:  left M2 territory infarct embolic secondary to atrial fibrillation s/p unsuccessful recanalization Code Stroke CT head Acute infarct involving  the left insular cortex.2. Chronic microvascular ischemic changes. ASPECTS is 9 CTA head and neck Patent ophthalmic artery origins. RightA1 segment hypoplasia. Patent ACAs. Patent right MCA. Left M2 segment occlusion. 30-40% distal left CCA narrowing secondary to atheromatous plaque. Mild narrowing of the proximal left subclavian and left vertebral artery origin. Bilateral upper lung nodular/patchy opacities concerning for infectious/inflammatory foci.  CT perfusion no core and 30cc ischemia.  MRI  Acute infarction in the left MCA territory affecting the deep insula and left frontoparietal cortical and subcortical brain. Minimal petechial blood products or susceptibility artifact related to thrombosed vessels. 2D Echo 55 to 60%. The left ventricle has normal function. The left ventricle has no regional wall motion abnormalities, PFO or LVT.  LDL unable to calculate, triglycerides 312  HgbA1c 10.6  VTE prophylaxis - SCDs Tube feeds  aspirin 81 mg daily prior to admission, now on aspirin 325 per tube.  Therapy recommendations:  pending  Disposition:  TBD  Hypertension  Home meds: Please refer to chart summary.  Stable on cleviprex, wean to off . SBP goal <160 but gradually normalize in 5-7 days . Metoprolol 50mg  bid, hydrochlorothiazide 12.5mg  daily . Hydralazine prn . Long-term BP goal normotensive  Atrial fibrillation  Amiodarone infusion  Rate control with Metoprolol 50bid  Hyperlipidemia  Home meds:  Rosuvastatin 10mg , resumed in hospital  LDL goal < 70  Order direct LDL: 70  Triglycerides 294  Continue statin at discharge  Diabetes type II uncontrolled  Home meds:  glimepiride (AMARYL) metFORMIN (GLUCOPHAGE)   HgbA1c 10.6, goal < 7.0  Levemir 20units bid and increase Novolog to 8units q 4  CBGs Recent Labs    11/28/20 0758 11/28/20 1300 11/28/20 1524  GLUCAP 254* 316* 286*      SSI  Other Stroke Risk Factors  Advanced Age >/= 60   former  Cigarette smoker  ETOH use, alcohol level unknown  Substance abuse - UDS:  THC No results found for requested labs within last 26280 hours., Cocaine No results found for requested labs within last 26280 hours.. Patient advised to stop using due to stroke risk.  Obesity, Body mass index is 26.94 kg/m., BMI >/= 30 associated with increased stroke risk, recommend weight loss, diet and exercise as appropriate   Other Active Problems Acute Hypoxic Respiratory Failure/Covid 19 positive/Pneumonia  Required mechanical ventialtion due to stroke and possibly pulmonary Covid-19 pneumonia  Extubate today but developed stridor, receiving neb treatment, Racemic epi, and IV Decadron and placed on Bipap  Continue Remdesivir  Steroids for symptomatic Covid-19 pneumonia per CCM   fever: tmax  99.9,WBC 11  Likely due to Covid-19 pneumonia  acute deep vein thrombosis   involving the right peroneal veins which is at risk for extension and PE and will need anticoagulation. Though the stroke is moderate in size and there is some risk for hemorrhage, the benefit to anticoagulation outweighs the risk of PE and recommend low intensity, no bolus heparin with close neurologic monitoring - Discussed with Dr. Shearon Stalls from [facility].  Heparin gtt to 1450 units/hr  Check 6 hr aPTT and heparin level  Daily CBC  Hypokalemia  Potassium level 3.4  Potassium chloride 58meq x1  Continue mobilization out of bed.  Therapy consults.  Speech therapy for swallow eval.  Continue IV heparin drip for atrial fibrillation and prevention with DVT.  Continue treatment for Covid infection as per critical care team.  Discussed with Dr. Lynetta Mare This patient is critically ill and at significant risk of neurological worsening, death and care requires constant monitoring of vital signs, hemodynamics,respiratory and cardiac monitoring, extensive review of multiple databases, frequent neurological assessment, discussion with family, other  specialists and medical decision making of high complexity.I have made any additions or clarifications directly to the above note.This critical care time does not reflect procedure time, or teaching time or supervisory time of PA/NP/Med Resident etc but could involve care discussion time.  I spent 30 minutes of neurocritical care time  in the care of  this patient.         Antony Contras, MD Medical Director [place] Pager: [phone] 11/28/2020 3:48 PM   To contact Stroke Continuity provider, please refer to [email or web address] After hours, contact General Neurology

## 2020-11-28 NOTE — Care Management (Signed)
Transitions of Care Team following this patient for discharge planning.  Currently, patient not medically stable; will continue to follow as patient progresses.    Reinaldo Raddle, RN, BSN  Trauma/Neuro ICU Case Manager [phone]

## 2020-11-28 NOTE — Procedures (Signed)
Extubation Procedure Note  Patient Details:   Name: Emily Lozano DOB: 01-Nov-1950 MRN: [id number]   Airway Documentation:    Vent end date: 11/28/20 Vent end time: 1043   Evaluation  O2 sats: stable throughout Complications: No apparent complications Patient did tolerate procedure well. Bilateral Breath Sounds: Clear,Diminished   Pt extubated to 3L nasal cannula per MD order. Pt had positive cuff leak prior to extubation. Stridor noted. MD aware. RT to continue to monitor.  Vilinda Blanks 11/28/2020, 10:44 AM

## 2020-11-29 DIAGNOSIS — I4891 Unspecified atrial fibrillation: Secondary | ICD-10-CM

## 2020-11-29 DIAGNOSIS — U071 COVID-19: Secondary | ICD-10-CM | POA: Diagnosis not present

## 2020-11-29 DIAGNOSIS — J1282 Pneumonia due to coronavirus disease 2019: Secondary | ICD-10-CM

## 2020-11-29 DIAGNOSIS — R1310 Dysphagia, unspecified: Secondary | ICD-10-CM | POA: Diagnosis not present

## 2020-11-29 DIAGNOSIS — J9601 Acute respiratory failure with hypoxia: Secondary | ICD-10-CM | POA: Diagnosis not present

## 2020-11-29 DIAGNOSIS — I639 Cerebral infarction, unspecified: Secondary | ICD-10-CM | POA: Diagnosis not present

## 2020-11-29 LAB — GLUCOSE, CAPILLARY
Glucose-Capillary: 125 mg/dL — ABNORMAL HIGH (ref 70–99)
Glucose-Capillary: 142 mg/dL — ABNORMAL HIGH (ref 70–99)
Glucose-Capillary: 182 mg/dL — ABNORMAL HIGH (ref 70–99)
Glucose-Capillary: 182 mg/dL — ABNORMAL HIGH (ref 70–99)
Glucose-Capillary: 90 mg/dL (ref 70–99)
Glucose-Capillary: 96 mg/dL (ref 70–99)
Glucose-Capillary: 96 mg/dL (ref 70–99)

## 2020-11-29 LAB — CBC WITH DIFFERENTIAL/PLATELET
Abs Immature Granulocytes: 0.39 10*3/uL — ABNORMAL HIGH (ref 0.00–0.07)
Basophils Absolute: 0.1 10*3/uL (ref 0.0–0.1)
Basophils Relative: 1 %
Eosinophils Absolute: 0 10*3/uL (ref 0.0–0.5)
Eosinophils Relative: 0 %
HCT: 36.6 % (ref 36.0–46.0)
Hemoglobin: 11.2 g/dL — ABNORMAL LOW (ref 12.0–15.0)
Immature Granulocytes: 3 %
Lymphocytes Relative: 14 %
Lymphs Abs: 1.6 10*3/uL (ref 0.7–4.0)
MCH: 24.3 pg — ABNORMAL LOW (ref 26.0–34.0)
MCHC: 30.6 g/dL (ref 30.0–36.0)
MCV: 79.4 fL — ABNORMAL LOW (ref 80.0–100.0)
Monocytes Absolute: 0.9 10*3/uL (ref 0.1–1.0)
Monocytes Relative: 8 %
Neutro Abs: 8.4 10*3/uL — ABNORMAL HIGH (ref 1.7–7.7)
Neutrophils Relative %: 74 %
Platelets: 515 10*3/uL — ABNORMAL HIGH (ref 150–400)
RBC: 4.61 MIL/uL (ref 3.87–5.11)
RDW: 16.5 % — ABNORMAL HIGH (ref 11.5–15.5)
WBC: 11.3 10*3/uL — ABNORMAL HIGH (ref 4.0–10.5)
nRBC: 0 % (ref 0.0–0.2)

## 2020-11-29 LAB — BASIC METABOLIC PANEL
Anion gap: 11 (ref 5–15)
BUN: 14 mg/dL (ref 8–23)
CO2: 23 mmol/L (ref 22–32)
Calcium: 8.6 mg/dL — ABNORMAL LOW (ref 8.9–10.3)
Chloride: 108 mmol/L (ref 98–111)
Creatinine, Ser: 0.6 mg/dL (ref 0.44–1.00)
GFR, Estimated: >60 mL/min (ref >60)
Glucose, Bld: 116 mg/dL — ABNORMAL HIGH (ref 70–99)
Potassium: 3.8 mmol/L (ref 3.5–5.1)
Sodium: 142 mmol/L (ref 135–145)

## 2020-11-29 LAB — HEPARIN LEVEL (UNFRACTIONATED)
Heparin Unfractionated: 0.59 IU/mL (ref 0.30–0.70)
Heparin Unfractionated: 0.44 IU/mL (ref 0.30–0.70)

## 2020-11-29 MED ORDER — INSULIN ASPART 100 UNIT/ML ~~LOC~~ SOLN
5.0000 [IU] | SUBCUTANEOUS | Status: DC
  Administered 2020-11-29: 5 [IU] via SUBCUTANEOUS

## 2020-11-29 MED ORDER — ORAL CARE MOUTH RINSE
15.0000 mL | Freq: Two times a day (BID) | OROMUCOSAL | Status: DC
  Administered 2020-11-29: 15 mL via OROMUCOSAL

## 2020-11-29 NOTE — Progress Notes (Signed)
Attempted to place NG tube with Marissa RN. Unsuccessful after two attempts. Will place order for CorTrak.

## 2020-11-29 NOTE — Progress Notes (Signed)
Nutrition Follow-up  DOCUMENTATION CODES:   Not applicable  INTERVENTION:   Initiate tube feeding via cortrak tube once placed:  Osmolite 1.5 at 45 ml/h (1080 ml per day) Prosource TF 45 ml TID  Provides 1740 kcal, 100 gm protein, 820 ml free water daily   NUTRITION DIAGNOSIS:   Inadequate oral intake related to inability to eat as evidenced by NPO status. Ongoing.   GOAL:   Patient will meet greater than or equal to 90% of their needs Not met.   MONITOR:   TF tolerance  REASON FOR ASSESSMENT:   Consult,Ventilator Enteral/tube feeding initiation and management  ASSESSMENT:   Pt with PMH of HTN, HLD, DM, melanoma, and recent COVID positive with new acute onset of R hemiparesis and aphasia now admitted with L MCA ischemic stroke out of the window for tPA s/p IR achieving TICI 2b revascularization.    Pt discussed during ICU rounds and with RN.  Pt currently COVID positive. No family present.  Pt extubated; failed swallow, remains aphasic   3/2 extubated 3/3 failed swallow, RN unable to place NG tube x 2, plan for cortrak next date   Medications reviewed and include: decadron, SSI, novolog, levemir Labs reviewed:  CBG's: 90-142 (NPO)     Diet Order:   Diet Order            Diet NPO time specified  Diet effective now                 EDUCATION NEEDS:   No education needs have been identified at this time  Skin:  Skin Assessment: Reviewed RN Assessment  Last BM:  3/2 x 3  Height:   Ht Readings from Last 1 Encounters:  11/24/20 5' 4"  (1.626 m)    Weight:   Wt Readings from Last 1 Encounters:  11/26/20 71.2 kg    Ideal Body Weight:  54.4 kg  BMI:  Body mass index is 26.94 kg/m.  Estimated Nutritional Needs:   Kcal:  1800-2000  Protein:  90-110 grams  Fluid:  > 1.8 L/day  Lockie Pares., RD, LDN, CNSC See AMiON for contact information

## 2020-11-29 NOTE — Progress Notes (Signed)
NAME:  Emily Lozano, MRN:  [id number], DOB:  1951/03/21, LOS: 4 ADMISSION DATE:  11/24/2020, CONSULTATION DATE:  11/24/2020 REFERRING MD:  Rory Percy - neuro, CHIEF COMPLAINT:  L M2 CVA, Covid infection, respiratory failure  Brief History:  70 yo admitted 2/26 with L M2 occlusion, outside of tpa window. Went to [facility] for thrombectomy, unsuccessful, c/b bleed.Resulted + for Covid in NIR, will remain intubated. Reportedly has had some mild URI sx x 1-2 wks (had tested negative OP in this time frame). hypertension, hyperlipidemia, type 2 diabetes mellitus, depression, GERD, melanoma, sleep apnea, and recent exposure to COVID-19  History of Present Illness:  70 yo F PMH HTN HLD Dm2 who presented as a code stroke 2/26 with acute R hemiparesis, L gaze preference, aphasia. KWN 10:30 AM -- outside of window for tpa. CTA head reveals L M2 occlusion, CTA cp with L MCA ischemia. Taken to [facility] for thrombectomy -- c/b bleed, thrombectomy unsuccessful.In NIR, COVID-19 resulted positive. Reportedly, pts family had +Covid recently, pt had URI sx and was tested for COVID 2/25 - and was negative, started on ZPack at that time.  Repeat neuroimaging is pending during [place] will be to remain intubated following case and transfer to ICU. PCCM consulted in this setting   Past Medical History:  HTN HLD DM2   Significant Hospital Events:  11/24/2020 - admit => Left distal M2/MCA middle division branch occlusion vi RCFA. :There was a left M2/MCA middle division branch occlusion with likely atherosclerotic plaque underlying. Two direct contact aspiration passes performed with recanalization of the proximal aspect of the M2 branch and persistent distal occlusion. Two passes were then performed with stent retriever with persistent occlusion. Flat panel head CT showed contrast extravasation in the left sylvian fissure. Delayed angiograms and delayed flat panel CT showed no evidence of active extravasation.  Consults:   11/24/2020  - CCM  Procedures:  2/26 - see events 3/2 - Extubated   Significant Diagnostic Tests:  2.26 - see events  Micro Data:  11/24/2020 - covid +  Antimicrobials:  2/26 - remdesivir  Interim History / Subjective:  She is much more alert this morning however still aphasic and tries to mumble  Objective   Blood pressure 134/61, pulse 81, temperature 99.1 F (37.3 C), temperature source Axillary, resp. rate (!) 28, height 5\' 4"  (1.626 m), weight 71.2 kg, SpO2 100 %.    Vent Mode: BIPAP;PCV FiO2 (%):  [30 %-40 %] 30 % Set Rate:  [12 bmp] 12 bmp PEEP:  [6 cmH20] 6 cmH20 Pressure Support:  [6 cmH20] 6 cmH20   Intake/Output Summary (Last 24 hours) at 11/29/2020 1109 Last data filed at 11/29/2020 1100 Gross per 24 hour  Intake 2937.1 ml  Output 1650 ml  Net 1287.1 ml   Filed Weights   11/24/20 1909 11/26/20 0253  Weight: 80 kg 71.2 kg    Examination: Const: In no apparent distress, lying comfortably in bed HEENT:  Placed on a BiPAP Resp: CTA BL, no wheezes, crackles, rhonchi CV: RRR, irregular, no MGR Abd: Bowel sounds present, nondistended, nontender to palpation Ext: No lower extremity edema, skin is warm to touch Neuro: Strength 0/5 on the right upper and lower extremities.  Able to raise left upper extremity and left lower extremity against gravity.  Speech still remains aphasic and she tries to [facility] as much as she can  Resolved Hospital Problem list   Acute respiratory failure  Assessment & Plan:  #Acute ischemic stroke Feb 26th,  Left M2 MCA.  Failed her swallow evaluation. Will place an NG tube today and start transitioning some of her IV meds to p.o. -Appreciate neurology recommendations -NG tube today -Continue to assess swallow function -If there is no improvement, will likely place a core track -Aspirin 325 mg daily -Secondary stroke prevention and rehabilitation   #Hypertensive Emergency Initially required Cleviprex but now off.  BP stable this  a.m. at 134/61 -Hydralazine prn -Hydrochlorothiazide 12.5 mg daily -Metoprolol 50 mg twice daily   #Atrial Fibrillation (CHA2DS2-VASc) score 5-6 Currently rate controlled on metoprolol and IV amiodarone -Continue metoprolol 50 mg twice daily -Continue IV amiodarone with plan to transition off or to p.o.   #Acute respiratory failure post op due to stroke in setting of covid  Resolved, extubated on 3/2 -Continue BiPAP.  If she tolerates nasal cannula, can transition off BiPAP   #COVID 19 positive, sympomatic prior to admission -Finished course of remdesivir -Continue Decadron   #Acute lower extremity DVT -Continue heparin infusion   #Diabetes mellitus On Metformin and Amaryl at home.  CBGs have improved despite being on Decadron  -SSI every 4 -NovoLog 8 units every 4 hours -Levemir 20 units twice daily   #History of Rheumatoid arthritis #Depression/Anxiety    Best practice (evaluated daily)  Diet: Aphasic, remains n.p.o.  NG tube today Pain/Anxiety/Delirium protocol (if indicated): Off all sedation VAP protocol (if indicated): yes DVT prophylaxis: scd + IV heparin due to recent DVT GI prophylaxis: ppi Glucose control: poorly controlled due to steroid induced will increase insulin detemir Mobility: PT OT. Disposition: ICU  Family update: Family at bedside  Goals of Care:  Per Primary Team  Labs   CBC: Recent Labs  Lab 11/24/20 1602 11/24/20 1604 11/24/20 2058 11/26/20 1051 11/27/20 0701 11/28/20 0215 11/29/20 0556  WBC 5.5  --  5.3 13.8* 11.4* 11.0* 11.3*  NEUTROABS 4.1  --   --  11.0*  --  8.8* 8.4*  HGB 11.7*   < > 11.9* 11.3* 10.9* 10.8* 11.2*  HCT 37.5   < > 40.3 35.4* 34.4* 33.5* 36.6  MCV 80.6  --  81.6 80.1 77.1* 76.8* 79.4*  PLT 403*  --  136* 576* 474* 481* 515*   < > = values in this interval not displayed.    Basic Metabolic Panel: Recent Labs  Lab 11/25/20 0608 11/26/20 0413 11/27/20 0701 11/27/20 1544 11/28/20 0215 11/29/20 0556   NA 143 139  --  142 144 142  K 2.8* 3.9  --  3.9 3.3* 3.8  CL 107 109  --  108 110 108  CO2 23 15*  --  22 23 23   GLUCOSE 202* 301*  --  366* 283* 116*  BUN 9 10  --  16 18 14   CREATININE 0.89 0.72  --  0.77 0.68 0.60  CALCIUM 8.1* 8.1*  --  8.2* 8.5* 8.6*  MG 1.9 2.3  --   --  2.1  --   PHOS 2.7 2.7 1.7*  --  2.5  --    GFR: Estimated Creatinine Clearance: 64.2 mL/min (by C-G formula based on SCr of 0.6 mg/dL). Recent Labs  Lab 11/24/20 2058 11/25/20 0544 11/25/20 0608 11/26/20 1051 11/27/20 0701 11/28/20 0215 11/29/20 0556  PROCALCITON <0.10  --  <0.10  --   --   --   --   WBC 5.3  --   --  13.8* 11.4* 11.0* 11.3*  LATICACIDVEN 2.4* 2.5*  --   --   --   --   --  Liver Function Tests: Recent Labs  Lab 11/24/20 1602 11/25/20 0608  AST 18 17  ALT 13 14  ALKPHOS 55 55  BILITOT 0.7 <0.1*  PROT 6.9 6.3*  ALBUMIN 2.9* 2.5*   No results for input(s): LIPASE, AMYLASE in the last 168 hours. No results for input(s): AMMONIA in the last 168 hours.  ABG    Component Value Date/Time   PHART 7.418 11/24/2020 2036   PCO2ART 37.4 11/24/2020 2036   PO2ART 97 11/24/2020 2036   HCO3 24.1 11/24/2020 2036   TCO2 25 11/24/2020 2036   O2SAT 98.0 11/24/2020 2036     Coagulation Profile: Recent Labs  Lab 11/24/20 1602  INR SPECIMEN CLOTTED    Cardiac Enzymes: No results for input(s): CKTOTAL, CKMB, CKMBINDEX, TROPONINI in the last 168 hours.  HbA1C: Hgb A1c MFr Bld  Date/Time Value Ref Range Status  11/25/2020 06:08 AM 10.6 (H) 4.8 - 5.6 % Final    Comment:    (NOTE) Pre diabetes:          5.7%-6.4%  Diabetes:              >6.4%  Glycemic control for   <7.0% adults with diabetes   11/24/2020 08:58 PM 10.4 (H) 4.8 - 5.6 % Final    Comment:    (NOTE) Pre diabetes:          5.7%-6.4%  Diabetes:              >6.4%  Glycemic control for   <7.0% adults with diabetes     CBG: Recent Labs  Lab 11/28/20 1524 11/28/20 2106 11/29/20 0040 11/29/20 0352  11/29/20 0832  GLUCAP 286* 241* 142* 90 125*    Review of Systems:   As per HPI  Past Medical History:  She,  has a past medical history of Anemia, Arthritis, Collagen vascular disease ([place]), Depression, Diabetes mellitus without complication ([place]), Hypertension, and Melanoma ([place]) (2014).   Surgical History:   Past Surgical History:  Procedure Laterality Date  . ABDOMINAL HYSTERECTOMY    . BREAST LUMPECTOMY    . CARPAL TUNNEL RELEASE    . ESOPHAGEAL MANOMETRY N/A 12/05/2015   Procedure: ESOPHAGEAL MANOMETRY (EM);  Surgeon: Josefine Class, MD;  Location: [facility] ENDOSCOPY;  Service: Endoscopy;  Laterality: N/A;  . ESOPHAGOGASTRODUODENOSCOPY (EGD) WITH PROPOFOL N/A 11/26/2015   Procedure: ESOPHAGOGASTRODUODENOSCOPY (EGD) WITH PROPOFOL;  Surgeon: Lollie Sails, MD;  Location[facility] ENDOSCOPY;  Service: Endoscopy;  Laterality: N/A;  . ESOPHAGOGASTRODUODENOSCOPY (EGD) WITH PROPOFOL N/A 04/30/2017   Procedure: ESOPHAGOGASTRODUODENOSCOPY (EGD) WITH PROPOFOL;  Surgeon: Lollie Sails, MD;  Location: [facility] ENDOSCOPY;  Service: Endoscopy;  Laterality: N/A;  . EXCISION MELANOMA WITH SENTINEL LYMPH NODE BIOPSY    . IR CT HEAD LTD  11/24/2020  . IR CT HEAD LTD  11/24/2020  . IR PERCUTANEOUS ART THROMBECTOMY/INFUSION INTRACRANIAL INC DIAG ANGIO  11/24/2020  . IR US GUIDE VASC ACCESS RIGHT  11/24/2020  . RADIOLOGY WITH ANESTHESIA N/A 11/24/2020   Procedure: IR WITH ANESTHESIA;  Surgeon: Radiologist, Medication, MD;  Location: [place];  Service: Radiology;  Laterality: N/A;     Social History:   reports that she has quit smoking. She has never used smokeless tobacco. She reports that she does not drink alcohol and does not use drugs.   Family History:  Her family history is not on file.   Allergies Allergies  Allergen Reactions  . Morphine And Related Nausea And Vomiting     Home Medications  Prior to Admission  medications   Medication Sig Start Date End Date Taking? Authorizing Provider   amLODipine (NORVASC) 5 MG tablet Take 5 mg by mouth daily. 10/23/20  Yes [provider]  aspirin EC 81 MG tablet Take 81 mg by mouth daily.   Yes [provider]  buPROPion (WELLBUTRIN XL) 300 MG 24 hr tablet Take 300 mg by mouth daily.   Yes [provider]  Dextromethorphan-guaiFENesin (ROBITUSSIN COUGH+CHEST CONG DM) 20-200 MG/20ML LIQD Take 20 mLs by mouth 2 (two) times daily as needed (cough/congestion).   Yes [provider]  dicyclomine (BENTYL) 10 MG capsule Take 10 mg by mouth 3 (three) times daily as needed (diarrhea).   Yes [provider]  DM-APAP-CPM (CORICIDIN HBP PO) Take 1 tablet by mouth 2 (two) times daily as needed (cough congestion).   Yes [provider]  glimepiride (AMARYL) 4 MG tablet Take 4 mg by mouth See admin instructions. Take one tablet (4 mg) by mouth every morning, take an additional tablet (4 mg) for CBG 250-300 10/23/20  Yes [provider]  hydroxychloroquine (PLAQUENIL) 200 MG tablet Take 200-400 mg by mouth See admin instructions. Take 2 tablets (400 mg) by mouth 1st day, then take 1 tablet (200 mg) 2nd day, then repeat alternating days   Yes [provider]  losartan-hydrochlorothiazide (HYZAAR) 100-12.5 MG tablet Take 1 tablet by mouth daily.   Yes [provider]  metFORMIN (GLUCOPHAGE) 500 MG tablet Take 500 mg by mouth 2 (two) times daily with a meal.   Yes [provider]  metoprolol succinate (TOPROL-XL) 100 MG 24 hr tablet Take 100 mg by mouth daily. Take with or immediately following a meal.   Yes [provider]  naproxen sodium (ALEVE) 220 MG tablet Take 220 mg by mouth 2 (two) times daily as needed (pain/headache).   Yes [provider]  pantoprazole (PROTONIX) 40 MG tablet Take 40 mg by mouth 2 (two) times daily.   Yes [provider]  rosuvastatin (CRESTOR) 10 MG tablet Take 10 mg by mouth daily.   Yes [provider]  Ascorbic  Acid (VITAMIN C) 1000 MG tablet Take 1,000 mg by mouth daily.    [provider]  azithromycin (ZITHROMAX) 250 MG tablet Take 250-500 mg by mouth See admin instructions. Take 2 tablets (500 mg) by mouth 1st day, then take 1 tablet (250 mg) daily on days 2-5 11/23/20   [provider]  benzonatate (TESSALON) 200 MG capsule Take 200 mg by mouth 3 (three) times daily as needed for cough. 11/23/20   [provider]  predniSONE (DELTASONE) 20 MG tablet Take 20 mg by mouth daily. 11/23/20   [provider]  Zinc 50 MG TABS Take 50 mg by mouth daily.    [provider]     Critical care time:

## 2020-11-29 NOTE — Evaluation (Signed)
Clinical/Bedside Swallow Evaluation Patient Details  Name: Emily Lozano MRN: [id number] Date of Birth: [birth year]  Today's Date: 11/29/2020 Time: SLP Start Time (ACUTE ONLY): 1010 SLP Stop Time (ACUTE ONLY): 1046 SLP Time Calculation (min) (ACUTE ONLY): 36 min  Past Medical History:  Past Medical History:  Diagnosis Date  . Anemia   . Arthritis   . 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   . Depression   . Diabetes mellitus without complication ([place])   . Hypertension   . Melanoma ([place]) 2014   resected from Left forearm.    Past Surgical History:  Past Surgical History:  Procedure Laterality Date  . ABDOMINAL HYSTERECTOMY    . BREAST LUMPECTOMY    . CARPAL TUNNEL RELEASE    . ESOPHAGEAL MANOMETRY N/A 12/05/2015   Procedure: ESOPHAGEAL MANOMETRY (EM);  Surgeon: Emily Class, MD;  Location: [facility] ENDOSCOPY;  Service: Endoscopy;  Laterality: N/A;  . ESOPHAGOGASTRODUODENOSCOPY (EGD) WITH PROPOFOL N/A 11/26/2015   Procedure: ESOPHAGOGASTRODUODENOSCOPY (EGD) WITH PROPOFOL;  Surgeon: Emily Sails, MD;  Location: [facility] ENDOSCOPY;  Service: Endoscopy;  Laterality: N/A;  . ESOPHAGOGASTRODUODENOSCOPY (EGD) WITH PROPOFOL N/A 04/30/2017   Procedure: ESOPHAGOGASTRODUODENOSCOPY (EGD) WITH PROPOFOL;  Surgeon: Emily Sails, MD;  Location: [facility] ENDOSCOPY;  Service: Endoscopy;  Laterality: N/A;  . EXCISION MELANOMA WITH SENTINEL LYMPH NODE BIOPSY    . IR CT HEAD LTD  11/24/2020  . IR CT HEAD LTD  11/24/2020  . IR PERCUTANEOUS ART THROMBECTOMY/INFUSION INTRACRANIAL INC DIAG ANGIO  11/24/2020  . IR US GUIDE VASC ACCESS RIGHT  11/24/2020  . RADIOLOGY WITH ANESTHESIA N/A 11/24/2020   Procedure: IR WITH ANESTHESIA;  Surgeon: Radiologist, Medication, MD;  Location: [place];  Service: Radiology;  Laterality: N/A;   HPI:  [name] is a 70 y.o. female Hx of multiple cerebrovascular risk factors presenting with sudden onset of left gaze preference, right facial droop,  hemiparesis and expressive aphasia still with left hemispheric dysfunction. She is also Covid positive with peroneal vein thrombosis on lower extremity Dopplers and she is on IV heparin. Intubated from 2/26-3/2 also with stridor following extubation. MRI shows Acute infarction in the left MCA territory affecting the deep insula and left frontoparietal cortical and subcortical brain.   Assessment / Plan / Recommendation Clinical Impression  Pt demonstrates concern for decreased airway protection with PO trials. Pt with oral groping with spoon, cup and straw suggestive of oral apraxia. She is only able to phonate minimally and vocal quality is soft and hoarse, cannot cough on command but invoulntary cough is weak and leads to brief stridor. Trials of thin liquids lead to immediate coughing, but pt appeared more successful with a few self fed sips of nectar thick liquids. Recommend FEES to determine readiness for POs, given active COVID status. FEES planned for 9am 11/30/20.  SLP Visit Diagnosis: Dysphagia, oropharyngeal phase (R13.12)    Aspiration Risk  Severe aspiration risk    Diet Recommendation NPO;Alternative means - temporary   Lozano Administration: Via alternative means    Other  Recommendations     Follow up Recommendations        Frequency and Duration            Prognosis        Swallow Study   General HPI: [name] is a 70 y.o. female Hx of multiple cerebrovascular risk factors presenting with sudden onset of left gaze preference, right facial droop, hemiparesis and expressive aphasia still with left  hemispheric dysfunction. She is also Covid positive with peroneal vein thrombosis on lower extremity Dopplers and she is on IV heparin. Intubated from 2/26-3/2 also with stridor following extubation. MRI shows Acute infarction in the left MCA territory affecting the deep insula and left frontoparietal cortical and subcortical brain. Type of Study: Bedside Swallow  Evaluation Previous Swallow Assessment: none Diet Prior to this Study: NPO Temperature Spikes Noted: No Respiratory Status: Nasal cannula History of Recent Intubation: No Behavior/Cognition: Alert;Cooperative;Requires cueing Oral Cavity Assessment: Dry;Dried secretions Oral Care Completed by SLP: Yes Oral Cavity - Dentition: Other (Comment) (lump on left gum) Vision: Impaired for self-feeding Self-Feeding Abilities: Needs assist;Able to feed self Patient Positioning: Upright in bed Baseline Vocal Quality: Low vocal intensity Volitional Cough: Other (Comment);Cognitively unable to elicit (involuntary cough weak) Volitional Swallow: Able to elicit    Oral/Motor/Sensory Function Overall Oral Motor/Sensory Function: Severe impairment Facial ROM: Reduced right;Suspected CN VII (facial) dysfunction Facial Symmetry: Abnormal symmetry right;Suspected CN VII (facial) dysfunction Facial Strength: Reduced right;Suspected CN VII (facial) dysfunction Lingual ROM:  (unable to protrude)   Ice Chips Ice chips: Impaired Presentation: Spoon Oral Phase Impairments: Reduced labial seal;Reduced lingual movement/coordination Oral Phase Functional Implications: Prolonged oral transit;Oral residue   Thin Liquid Thin Liquid: Impaired Presentation: Cup Oral Phase Impairments: Reduced lingual movement/coordination;Reduced labial seal Pharyngeal  Phase Impairments: Cough - Immediate    Nectar Thick Nectar Thick Liquid: Impaired Presentation: Cup Oral Phase Impairments: Reduced labial seal Pharyngeal Phase Impairments: Multiple swallows   Honey Thick Honey Thick Liquid: Not tested   Puree Puree: Not tested   Solid     Solid: Not tested      Lynann Beaver 11/29/2020,1:54 PM

## 2020-11-29 NOTE — Evaluation (Signed)
Speech Language Pathology Evaluation Patient Details Name: Emily Lozano MRN: [id number] DOB: [birth year] Today's Date: 11/29/2020 Time: 2979-8921 SLP Time Calculation (min) (ACUTE ONLY): 28 min  Problem List:  Patient Active Problem List   Diagnosis Date Noted  . Acute ischemic stroke ([place]) 11/24/2020   Past Medical History:  Past Medical History:  Diagnosis Date  . Anemia   . Arthritis   . 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   . Depression   . Diabetes mellitus without complication ([place])   . Hypertension   . Melanoma ([place]) 2014   resected from Left forearm.    Past Surgical History:  Past Surgical History:  Procedure Laterality Date  . ABDOMINAL HYSTERECTOMY    . BREAST LUMPECTOMY    . CARPAL TUNNEL RELEASE    . ESOPHAGEAL MANOMETRY N/A 12/05/2015   Procedure: ESOPHAGEAL MANOMETRY (EM);  Surgeon: Josefine Class, MD;  Location: [facility] ENDOSCOPY;  Service: Endoscopy;  Laterality: N/A;  . ESOPHAGOGASTRODUODENOSCOPY (EGD) WITH PROPOFOL N/A 11/26/2015   Procedure: ESOPHAGOGASTRODUODENOSCOPY (EGD) WITH PROPOFOL;  Surgeon: Lollie Sails, MD;  Location: [facility] ENDOSCOPY;  Service: Endoscopy;  Laterality: N/A;  . ESOPHAGOGASTRODUODENOSCOPY (EGD) WITH PROPOFOL N/A 04/30/2017   Procedure: ESOPHAGOGASTRODUODENOSCOPY (EGD) WITH PROPOFOL;  Surgeon: Lollie Sails, MD;  Location: [facility] ENDOSCOPY;  Service: Endoscopy;  Laterality: N/A;  . EXCISION MELANOMA WITH SENTINEL LYMPH NODE BIOPSY    . IR CT HEAD LTD  11/24/2020  . IR CT HEAD LTD  11/24/2020  . IR PERCUTANEOUS ART THROMBECTOMY/INFUSION INTRACRANIAL INC DIAG ANGIO  11/24/2020  . IR US GUIDE VASC ACCESS RIGHT  11/24/2020  . RADIOLOGY WITH ANESTHESIA N/A 11/24/2020   Procedure: IR WITH ANESTHESIA;  Surgeon: Radiologist, Medication, MD;  Location: [place];  Service: Radiology;  Laterality: N/A;   HPI:  Ms. MARKEE REMLINGER is a 70 y.o. female Hx of multiple cerebrovascular risk factors presenting with sudden onset  of left gaze preference, right facial droop, hemiparesis and expressive aphasia still with left hemispheric dysfunction. She is also Covid positive with peroneal vein thrombosis on lower extremity Dopplers and she is on IV heparin. Intubated from 2/26-3/2 also with stridor following extubation. MRI shows Acute infarction in the left MCA territory affecting the deep insula and left frontoparietal cortical and subcortical brain.   Assessment / Plan / Recommendation Clinical Impression  Pt demonstrates what appears to be a primary motor planning impairment with possible auditory comprehension and expressive language impariments as well. Pt is attentive verbal and functional tasks on the left, can turn gaze right with cues, but appears to struggle visually focusing on any object and makes squinting faces when trying to see objects. She is able to model a head nod yes and slightly shake head no though this was more effortful. With Y/N biographical questions, she answered 100% of the Y questions with a nod, but could not respond no. She was unable to point to objects to identify them or f/c with her hand. When asked to follow oral commands she always opened her mouth but nothimg more. She can phonate on command and phonate with counting, but cannot articulate. Will need to work on functional communication and target exercises for apraxia and comprehension. Recommend CIR.    SLP Assessment  SLP Recommendation/Assessment: Patient needs continued Speech Lanaguage Pathology Services SLP Visit Diagnosis: Dysphagia, oropharyngeal phase (R13.12)    Follow Up Recommendations  Inpatient Rehab    Frequency and Duration min 2x/week  2 weeks  SLP Evaluation Cognition  Overall Cognitive Status: Difficult to assess Arousal/Alertness: Awake/alert Orientation Level: Oriented to person Attention: Focused;Sustained Focused Attention: Appears intact Sustained Attention: Appears intact       Comprehension   Auditory Comprehension Overall Auditory Comprehension: Impaired Yes/No Questions: Impaired Basic Biographical Questions: 26-50% accurate Basic Immediate Environment Questions: 25-49% accurate Commands: Impaired One Step Basic Commands: 0-24% accurate Reading Comprehension Reading Status: Impaired Word level: Impaired    Expression Verbal Expression Overall Verbal Expression: Impaired Initiation: Impaired Automatic Speech: Counting;Name (phonated, could not articualte) Level of Generative/Spontaneous Verbalization:  (phonation) Repetition: Impaired Level of Impairment: Word level (sound level)   Oral / Motor  Oral Motor/Sensory Function Overall Oral Motor/Sensory Function: Severe impairment Facial ROM: Reduced right;Suspected CN VII (facial) dysfunction Facial Symmetry: Abnormal symmetry right;Suspected CN VII (facial) dysfunction Facial Strength: Reduced right;Suspected CN VII (facial) dysfunction Motor Speech Overall Motor Speech: Impaired Respiration: Within functional limits Phonation: Low vocal intensity Articulation: Impaired Level of Impairment: Word Motor Planning: Impaired   GO                   Herbie Baltimore, MA CCC-SLP  Acute Rehabilitation Services Pager [phone] Office [phone]  Lynann Beaver 11/29/2020, 2:06 PM

## 2020-11-29 NOTE — Progress Notes (Signed)
ANTICOAGULATION CONSULT NOTE - Follow Up Consult  Pharmacy Consult for Heparin Indication: DVT  Allergies  Allergen Reactions  . Morphine And Related Nausea And Vomiting    Patient Measurements: Height: 5\' 4"  (162.6 cm) Weight: 71.2 kg (156 lb 15.5 oz) IBW/kg (Calculated) : 54.7 Heparin Dosing Weight:  71.2 kg  Vital Signs: BP: 160/72 (03/03 2000) Pulse Rate: 71 (03/03 2000)  Labs: Recent Labs    11/27/20 0701 11/27/20 1544 11/28/20 0215 11/29/20 0556 11/29/20 2011  HGB 10.9*  --  10.8* 11.2*  --   HCT 34.4*  --  33.5* 36.6  --   PLT 474*  --  481* 515*  --   APTT 58*  --   --   --   --   HEPARINUNFRC 0.31 0.52 0.43 0.59 0.44  CREATININE  --  0.77 0.68 0.60  --     Estimated Creatinine Clearance: 64.2 mL/min (by C-G formula based on SCr of 0.6 mg/dL).   Assessment: Anticoag: Heparin for RLE DVT, CVA - HL 0.43, 0.44 remains in goal range.  Goal of Therapy:  Heparin level 0.3-0.5 units/ml Monitor platelets by anticoagulation protocol: Yes   Plan:  Con't IV heparin at 1350 units/hr Daily HL and CBC   Manreet Kiernan S. Alford Highland, PharmD, BCPS Clinical Staff Pharmacist [email or web address] Alford Highland, [place] 11/29/2020,9:39 PM

## 2020-11-29 NOTE — Progress Notes (Signed)
[place] for Heparin Indication: RLE DVT  Allergies  Allergen Reactions  . Morphine And Related Nausea And Vomiting    Patient Measurements: Height: 5\' 4"  (162.6 cm) Weight: 71.2 kg (156 lb 15.5 oz) IBW/kg (Calculated) : 54.7 Heparin Dosing Weight: 72 kg  Vital Signs: BP: 137/65 (03/03 0900) Pulse Rate: 83 (03/03 0900)  Labs: Recent Labs    11/26/20 1051 11/26/20 2034 11/27/20 0324 11/27/20 0701 11/27/20 1544 11/28/20 0215 11/29/20 0556  HGB 11.3*  --   --  10.9*  --  10.8* 11.2*  HCT 35.4*  --   --  34.4*  --  33.5* 36.6  PLT 576*  --   --  474*  --  481* 515*  APTT 51* 57*  --  58*  --   --   --   HEPARINUNFRC  --  0.41   < > 0.31 0.52 0.43 0.59  CREATININE  --   --   --   --  0.77 0.68 0.60   < > = values in this interval not displayed.    Estimated Creatinine Clearance: 64.2 mL/min (by C-G formula based on SCr of 0.6 mg/dL).   Assessment: 41 YOF who presented on 2/26 with R-hemiparesis, L-gaze, and aphasia found to have L-M2 branch occlusion, now s/p thrombectomy on 2/26 by neuro-IR. COVID+ and found via dopplers on 2/27 to have a RLE DVT.   Heparin level above low goal 0.59  Goal of Therapy:  Heparin level 0.3-0.5 units/ml  Monitor platelets by anticoagulation protocol: Yes   Plan:  Reduce heparin to 1350 units/hr Recheck this evening  Barth Kirks, PharmD, [place] Pharmacist [phone]  Please check AMION for all [place] numbers  11/29/2020 10:29 AM

## 2020-11-29 NOTE — Progress Notes (Signed)
STROKE TEAM PROGRESS NOTE   INTERVAL HISTORY  She remains aphasic, trying to speak today. Weaned off Bipap.  Remains on heparin.  Requring cleviprex (intermittently).  Amiodarone drip still in use. Will require  NGT for feeding and medications today and cortrak tube placed tomorrow.  Neurological exam mostly unchanged.  Vital signs stable.     Vitals:   11/29/20 0700 11/29/20 0741 11/29/20 0800 11/29/20 0900  BP: 131/67  132/73 137/65  Pulse: 71  79 83  Resp: (!) 23  18 (!) 23  Temp:      TempSrc:      SpO2: 100% 100% 100% 99%  Weight:      Height:       CBC:  Recent Labs  Lab 11/28/20 0215 11/29/20 0556  WBC 11.0* 11.3*  NEUTROABS 8.8* 8.4*  HGB 10.8* 11.2*  HCT 33.5* 36.6  MCV 76.8* 79.4*  PLT 481* 016*   Basic Metabolic Panel:  Recent Labs  Lab 11/26/20 0413 11/27/20 0701 11/27/20 1544 11/28/20 0215 11/29/20 0556  NA 139  --    < > 144 142  K 3.9  --    < > 3.3* 3.8  CL 109  --    < > 110 108  CO2 15*  --    < > 23 23  GLUCOSE 301*  --    < > 283* 116*  BUN 10  --    < > 18 14  CREATININE 0.72  --    < > 0.68 0.60  CALCIUM 8.1*  --    < > 8.5* 8.6*  MG 2.3  --   --  2.1  --   PHOS 2.7 1.7*  --  2.5  --    < > = values in this interval not displayed.   Lipid Panel:  Recent Labs  Lab 11/25/20 0608 11/26/20 0413 11/28/20 0215  CHOL 139  --   --   TRIG 418*   < > 294*  HDL 15*  --   --   CHOLHDL 9.3  --   --   VLDL UNABLE TO CALCULATE IF TRIGLYCERIDE OVER 400 mg/dL  --   --   LDLCALC UNABLE TO CALCULATE IF TRIGLYCERIDE OVER 400 mg/dL  --   --    < > = values in this interval not displayed.   HgbA1c:  Recent Labs  Lab 11/25/20 0608  HGBA1C 10.6*   Urine Drug Screen: No results for input(s): LABOPIA, COCAINSCRNUR, LABBENZ, AMPHETMU, THCU, LABBARB in the last 168 hours.  Alcohol Level No results for input(s): ETH in the last 168 hours.  IMAGING past 24 hours No results found.  PHYSICAL EXAM Pleasant elderly Caucasian lady.  She is without extra  work of breathing today.  Afebrile. Head is nontraumatic. Neck is supple without bruit.    Cardiac exam no murmur or gallop. Lungs are clear to auscultation. Distal pulses are well felt.   Neurological Exam :  Mental Status: Patient is awake and has receptive aphasia and is following very few midline commands inconsistently.  Moves her left arm and leg purposefully. There is some evidence of right-sided neglect on examination.  Blinks to threat on the left. Pupils are equal, round, and reactive to light with left gaze preference but able to cross midline upon stimulus like light or noise.  Opens eyes to gentle sensory stimulus of face.VII: Partial right facial palsy noted with right mouth and nasolabial fold involvement.  VIII: Hearing is intact to voice X:  Phonation intact XI: Head appears midline XII: Patient does not protrude tongue to command.  Motor: Tone is normal. Bulk is normal. Left upper and lower extremity 4/5 strength without pronator drift. Right upper flicker and lower extremity withdraws briskly to painful stimulus.  Sensory: Sensation unable to test due to cooperation  Cerebellar: Unable to assess with significant weakness on the right upper and lower extremities   ASSESSMENT/PLAN [name] is a 70 y.o. female Hx of multiple cerebrovascular risk factors presenting with sudden onset of left gaze preference, right facial droop, hemiparesis and expressive aphasia still with left hemispheric dysfunction. CTA head and neck with left M2 occlusion and though outside the window for IV TPA was a candidate for intervention now s/p unsuccessful revascularization, though there was some distal movement of the clot from M2 to M3; TIKI3. Now intubated and mildly sedated but awake not moving right side or following commands.  She is also Covid positive with peroneal vein thrombosis on lower extremity Dopplers and she is on IV heparin.  Continue close neurological monitoring and  heparin level.     Stroke:  left M2 territory infarct embolic secondary to atrial fibrillation s/p unsuccessful recanalization Code Stroke CT head Acute infarct involving the left insular cortex.2. Chronic microvascular ischemic changes. ASPECTS is 9 CTA head and neck Patent ophthalmic artery origins. RightA1 segment hypoplasia. Patent ACAs. Patent right MCA. Left M2 segment occlusion. 30-40% distal left CCA narrowing secondary to atheromatous plaque. Mild narrowing of the proximal left subclavian and left vertebral artery origin. Bilateral upper lung nodular/patchy opacities concerning for infectious/inflammatory foci.  CT perfusion no core and 30cc ischemia.  MRI  Acute infarction in the left MCA territory affecting the deep insula and left frontoparietal cortical and subcortical brain. Minimal petechial blood products or susceptibility artifact related to thrombosed vessels. 2D Echo 55 to 60%. The left ventricle has normal function. The left ventricle has no regional wall motion abnormalities, PFO or LVT.  LDL unable to calculate, triglycerides 312  HgbA1c 10.6  VTE prophylaxis - SCDs Tube feeds  aspirin 81 mg daily prior to admission, now on aspirin 325 per tube.  Therapy recommendations:  pending  Disposition:  TBD  Hypertension  Home meds: Please refer to chart summary.  Stable on cleviprex, wean to off . SBP goal <160 but gradually normalize in 5-7 days . Metoprolol 50mg  bid, hydrochlorothiazide 12.5mg  daily . Hydralazine prn . Long-term BP goal normotensive  Atrial fibrillation  Amiodarone infusion  Rate control with Metoprolol 50bid  Hyperlipidemia  Home meds:  Rosuvastatin 10mg , resumed in hospital  LDL goal < 70  Order direct LDL: 70  Triglycerides 294  Continue statin at discharge  Diabetes type II uncontrolled  Home meds:  glimepiride (AMARYL) metFORMIN (GLUCOPHAGE)   HgbA1c 10.6, goal < 7.0  Levemir 20units bid and increase Novolog to 8units q  4  CBGs Recent Labs    11/29/20 0040 11/29/20 0352 11/29/20 0832  GLUCAP 142* 90 125*      SSI  Other Stroke Risk Factors  Advanced Age >/= 65   former Cigarette smoker  ETOH use, alcohol level unknown  Substance abuse - UDS:  THC No results found for requested labs within last 26280 hours., Cocaine No results found for requested labs within last 26280 hours.. Patient advised to stop using due to stroke risk.  Obesity, Body mass index is 26.94 kg/m., BMI >/= 30 associated with increased stroke risk, recommend weight loss, diet and exercise as appropriate   Other  Active Problems Acute Hypoxic Respiratory Failure/Covid 19 positive/Pneumonia  Required mechanical ventialtion due to stroke and possibly pulmonary Covid-19 pneumonia  Extubate today but developed stridor, receiving neb treatment, Racemic epi, and IV Decadron and placed on Bipap  Continue Remdesivir  Steroids for symptomatic Covid-19 pneumonia per CCM   fever: tmax 99,WBC 11.3 stable   Likely due to Covid-19 pneumonia  Acute deep vein thrombosis   Right peroneal veins which is at risk for extension and PE and will need anticoagulation. Though the stroke is moderate in size and there is some risk for hemorrhage, the benefit to anticoagulation outweighs the risk of PE and recommend low intensity, no bolus heparin with close neurologic monitoring -   Heparin gtt to 1350 units/hr  Check 6 hr aPTT and heparin level  Daily CBC stable   Hypokalemia  Potassium level 3.4->3.8   Continue mobilization out of bed.  Speech therapy for swallow eval.  She will likely need NG tube for feeding today and medicines and the core track tube tomorrow.  Transition from IV amiodarone to oral when we have a feeding tube.  Continue IV heparin.  Long discussion with patient's son and the bedside about her prognosis and plan of care and answered questions.  Discussed with Dr. Ernest Mallick critical care medicine.   This patient  is critically ill and at significant risk of neurological worsening, death and care requires constant monitoring of vital signs, hemodynamics,respiratory and cardiac monitoring, extensive review of multiple databases, frequent neurological assessment, discussion with family, other specialists and medical decision making of high complexity.I have made any additions or clarifications directly to the above note.This critical care time does not reflect procedure time, or teaching time or supervisory time of PA/NP/Med Resident etc but could involve care discussion time.  I spent 30 minutes of neurocritical care time  in the care of  this patient.         Antony Contras, MD Medical Director [place] Pager: [phone] 11/29/2020 10:01 AM   To contact Stroke Continuity provider, please refer to [email or web address] After hours, contact General Neurology

## 2020-11-29 NOTE — Progress Notes (Signed)
Amiodarone stopped per VO from Dr. Carlis Abbott.

## 2020-11-30 ENCOUNTER — Other Ambulatory Visit: Payer: Self-pay

## 2020-11-30 DIAGNOSIS — J1282 Pneumonia due to coronavirus disease 2019: Secondary | ICD-10-CM | POA: Diagnosis not present

## 2020-11-30 DIAGNOSIS — I639 Cerebral infarction, unspecified: Secondary | ICD-10-CM | POA: Diagnosis not present

## 2020-11-30 DIAGNOSIS — U071 COVID-19: Secondary | ICD-10-CM

## 2020-11-30 LAB — CBC WITH DIFFERENTIAL/PLATELET
Abs Immature Granulocytes: 0.23 10*3/uL — ABNORMAL HIGH (ref 0.00–0.07)
Basophils Absolute: 0 10*3/uL (ref 0.0–0.1)
Basophils Relative: 0 %
Eosinophils Absolute: 0.1 10*3/uL (ref 0.0–0.5)
Eosinophils Relative: 1 %
HCT: 35.1 % — ABNORMAL LOW (ref 36.0–46.0)
Hemoglobin: 11.2 g/dL — ABNORMAL LOW (ref 12.0–15.0)
Immature Granulocytes: 3 %
Lymphocytes Relative: 17 %
Lymphs Abs: 1.5 10*3/uL (ref 0.7–4.0)
MCH: 24.8 pg — ABNORMAL LOW (ref 26.0–34.0)
MCHC: 31.9 g/dL (ref 30.0–36.0)
MCV: 77.7 fL — ABNORMAL LOW (ref 80.0–100.0)
Monocytes Absolute: 0.5 10*3/uL (ref 0.1–1.0)
Monocytes Relative: 6 %
Neutro Abs: 6.4 10*3/uL (ref 1.7–7.7)
Neutrophils Relative %: 73 %
Platelets: 446 10*3/uL — ABNORMAL HIGH (ref 150–400)
RBC: 4.52 MIL/uL (ref 3.87–5.11)
RDW: 16.4 % — ABNORMAL HIGH (ref 11.5–15.5)
WBC: 8.7 10*3/uL (ref 4.0–10.5)
nRBC: 0 % (ref 0.0–0.2)

## 2020-11-30 LAB — BASIC METABOLIC PANEL
Anion gap: 8 (ref 5–15)
BUN: 20 mg/dL (ref 8–23)
CO2: 24 mmol/L (ref 22–32)
Calcium: 8.2 mg/dL — ABNORMAL LOW (ref 8.9–10.3)
Chloride: 104 mmol/L (ref 98–111)
Creatinine, Ser: 0.61 mg/dL (ref 0.44–1.00)
GFR, Estimated: >60 mL/min (ref >60)
Glucose, Bld: 135 mg/dL — ABNORMAL HIGH (ref 70–99)
Potassium: 3.6 mmol/L (ref 3.5–5.1)
Sodium: 136 mmol/L (ref 135–145)

## 2020-11-30 LAB — CULTURE, BLOOD (ROUTINE X 2)
Culture: NO GROWTH
Culture: NO GROWTH

## 2020-11-30 LAB — HEPARIN LEVEL (UNFRACTIONATED)
Heparin Unfractionated: 0.51 IU/mL (ref 0.30–0.70)
Heparin Unfractionated: 0.43 IU/mL (ref 0.30–0.70)

## 2020-11-30 LAB — GLUCOSE, CAPILLARY
Glucose-Capillary: 142 mg/dL — ABNORMAL HIGH (ref 70–99)
Glucose-Capillary: 103 mg/dL — ABNORMAL HIGH (ref 70–99)
Glucose-Capillary: 101 mg/dL — ABNORMAL HIGH (ref 70–99)
Glucose-Capillary: 236 mg/dL — ABNORMAL HIGH (ref 70–99)
Glucose-Capillary: 210 mg/dL — ABNORMAL HIGH (ref 70–99)

## 2020-11-30 MED ORDER — METOPROLOL TARTRATE 50 MG PO TABS
50.0000 mg | ORAL_TABLET | Freq: Two times a day (BID) | ORAL | Status: DC
  Administered 2020-11-30 – 2020-12-10 (×20): 50 mg via ORAL
  Filled 2020-11-30 (×21): qty 1

## 2020-11-30 MED ORDER — LISINOPRIL 5 MG PO TABS
5.0000 mg | ORAL_TABLET | Freq: Every day | ORAL | Status: DC
  Administered 2020-11-30 – 2020-12-03 (×4): 5 mg via ORAL
  Filled 2020-11-30 (×4): qty 1

## 2020-11-30 MED ORDER — CHLORHEXIDINE GLUCONATE 0.12 % MT SOLN
15.0000 mL | Freq: Two times a day (BID) | OROMUCOSAL | Status: DC
  Administered 2020-11-30 – 2020-12-10 (×20): 15 mL via OROMUCOSAL
  Filled 2020-11-30 (×17): qty 15

## 2020-11-30 MED ORDER — SODIUM CHLORIDE 0.9 % IV SOLN: INTRAVENOUS | Status: DC

## 2020-11-30 MED ORDER — ORAL CARE MOUTH RINSE
15.0000 mL | Freq: Two times a day (BID) | OROMUCOSAL | Status: DC
  Administered 2020-11-30 – 2020-12-08 (×16): 15 mL via OROMUCOSAL

## 2020-11-30 MED ORDER — RESOURCE THICKENUP CLEAR PO POWD
ORAL | Status: DC | PRN
  Filled 2020-11-30: qty 125

## 2020-11-30 MED ORDER — ASPIRIN 81 MG PO CHEW
324.0000 mg | CHEWABLE_TABLET | Freq: Every day | ORAL | Status: DC
  Administered 2020-11-30: 324 mg via ORAL
  Filled 2020-11-30 (×2): qty 4

## 2020-11-30 MED ORDER — DEXAMETHASONE 6 MG PO TABS
6.0000 mg | ORAL_TABLET | Freq: Every day | ORAL | Status: DC
  Administered 2020-11-30 – 2020-12-01 (×2): 6 mg via ORAL
  Filled 2020-11-30 (×2): qty 2

## 2020-11-30 MED ORDER — HYDROCHLOROTHIAZIDE 12.5 MG PO CAPS
12.5000 mg | ORAL_CAPSULE | Freq: Every day | ORAL | Status: DC
  Administered 2020-11-30 – 2020-12-02 (×3): 12.5 mg via ORAL
  Filled 2020-11-30 (×3): qty 1

## 2020-11-30 MED ORDER — PANTOPRAZOLE SODIUM 40 MG PO TBEC
40.0000 mg | DELAYED_RELEASE_TABLET | Freq: Every day | ORAL | Status: DC
  Administered 2020-11-30 – 2020-12-09 (×10): 40 mg via ORAL
  Filled 2020-11-30 (×11): qty 1

## 2020-11-30 NOTE — Progress Notes (Signed)
[place] for Heparin Indication: RLE DVT  Allergies  Allergen Reactions  . Morphine And Related Nausea And Vomiting    Patient Measurements: Height: 5\' 4"  (162.6 cm) Weight: 71.1 kg (156 lb 12 oz) IBW/kg (Calculated) : 54.7 Heparin Dosing Weight: 72 kg  Vital Signs: BP: 129/56 (03/04 1000) Pulse Rate: 81 (03/04 1000)  Labs: Recent Labs    11/28/20 0215 11/29/20 0556 11/29/20 2011 11/30/20 0311  HGB 10.8* 11.2*  --  11.2*  HCT 33.5* 36.6  --  35.1*  PLT 481* 515*  --  446*  HEPARINUNFRC 0.43 0.59 0.44 0.51  CREATININE 0.68 0.60  --  0.61    Estimated Creatinine Clearance: 64.2 mL/min (by C-G formula based on SCr of 0.61 mg/dL).   Assessment: 34 YOF who presented on 2/26 with R-hemiparesis, L-gaze, and aphasia found to have L-M2 branch occlusion, now s/p thrombectomy on 2/26 by neuro-IR. COVID+ and found via dopplers on 2/27 to have a RLE DVT.   Heparin level above low goal 0.51  Goal of Therapy:  Heparin level 0.3-0.5 units/ml  Monitor platelets by anticoagulation protocol: Yes   Plan:  Reduce heparin to 1300 units/hr Recheck this evening  Barth Kirks, PharmD, [place] Pharmacist [phone]  Please check AMION for all [place] numbers  11/30/2020 10:38 AM

## 2020-11-30 NOTE — Progress Notes (Signed)
Brief Cortrak Note  Consult received to place Cortrak. Discussed pt with RN and SLP who report pt passed FEES this morning and will no longer need Cortrak. SLP requested consult for placement be discontinued. Please re-consult if plan of care changes.  Larkin Ina, MS, RD, LDN RD pager number and weekend/on-call pager number located in [place].

## 2020-11-30 NOTE — Progress Notes (Signed)
Physical Therapy Treatment Patient Details Name: ERENDIRA CRABTREE MRN: [id number] DOB: 23-Sep-1951 Today's Date: 11/30/2020    History of Present Illness 70 y.o. female with a medical history significant for hypertension, hyperlipidemia, type 2 diabetes mellitus, depression, GERD, melanoma, sleep apnea, and recent exposure to COVID-19 with a negative test at her PCP yesterday but diagnosed with acute bronchitis. Pt presents with acute onset R weakness, aphasia, left gaze. CTA demonstrates L M2 occlusion. Pt underwent thrombectomy on 11/24/2020 (unsuccessful)    PT Comments    Pt more alert and participative, but still needing cues with verbal commands.  Pt starting to show some improvement toward goals.  Emphasis on rolling, transition to sitting, scooting toward EOB, sitting balance/activation of truncal muscles, sit to stand x4 with scoot up toward HOB.  Standing with work on attaining upright posture with need for truncal and bil LE facilitation.    Follow Up Recommendations  CIR;Supervision/Assistance - 24 hour     Equipment Recommendations  Other (comment) (TBD)    Recommendations for Other Services Rehab consult     Precautions / Restrictions Precautions Precautions: Fall Precaution Comments: COVID postive    Mobility  Bed Mobility Overal bed mobility: Needs Assistance Bed Mobility: Rolling;Supine to Sit;Sit to Supine Rolling: Mod assist;Max assist   Supine to sit: Mod assist Sit to supine: Max assist;+2 for physical assistance   General bed mobility comments: Mod A to bring BLEs towards EOB and then elevate trunk. Max A +2 for return to bed.    Transfers Overall transfer level: Needs assistance Equipment used: 2 person hand held assist Transfers: Sit to/from Stand Sit to Stand: Max assist;+2 physical assistance;+2 safety/equipment         General transfer comment: Max a +2 to power up into standing. Blocking bil knees. Tactile and verbal cues for hip tuck and  elevate chest. Performing x5  Ambulation/Gait                 Stairs             Wheelchair Mobility    Modified Rankin (Stroke Patients Only)       Balance Overall balance assessment: Needs assistance Sitting-balance support: Feet supported Sitting balance-Leahy Scale: Poor Sitting balance - Comments: pt listed R and posteriorly unless assist to hold midline. pt made more attempts to correct to midline, but still need mod/max assist Postural control: Right lateral lean;Posterior lean Standing balance support: During functional activity;Bilateral upper extremity supported Standing balance-Leahy Scale: Poor Standing balance comment: Reliant on physical A and blocking of BLEs.  With facilitation pt able to attain full upright posture.                            Cognition Arousal/Alertness: Awake/alert Behavior During Therapy: WFL for tasks assessed/performed Overall Cognitive Status: Impaired/Different from baseline Area of Impairment: Attention;Following commands;Awareness;Problem solving                   Current Attention Level: Sustained   Following Commands: Follows one step commands with increased time   Awareness: Intellectual Problem Solving: Slow processing;Requires verbal cues;Requires tactile cues General Comments: Following one step commands 75% of time. Requiring increased time and cues throughotu. Making eye contact. Inattention to R      Exercises      General Comments General comments (skin integrity, edema, etc.): SpO2 90s on 5L. HR 70-80s. BP stable. RR 20s.      Pertinent Vitals/Pain Pain  Assessment: Faces Faces Pain Scale: No hurt Pain Intervention(s): Monitored during session    Home Living Family/patient expects to be discharged to:: Private residence Living Arrangements: Children                  Prior Function            PT Goals (current goals can now be found in the care plan section) Acute  Rehab PT Goals Patient Stated Goal: Pt unstable to state PT Goal Formulation: Patient unable to participate in goal setting Time For Goal Achievement: 12/10/20 Potential to Achieve Goals: Fair Progress towards PT goals: Progressing toward goals    Frequency    Min 3X/week      PT Plan Current plan remains appropriate    Co-evaluation PT/OT/SLP Co-Evaluation/Treatment: Yes Reason for Co-Treatment: For patient/therapist safety;To address functional/ADL transfers PT goals addressed during session: Mobility/safety with mobility OT goals addressed during session: ADL's and self-care      AM-PAC PT "6 Clicks" Mobility   Outcome Measure  Help needed turning from your back to your side while in a flat bed without using bedrails?: A Lot Help needed moving from lying on your back to sitting on the side of a flat bed without using bedrails?: A Lot Help needed moving to and from a bed to a chair (including a wheelchair)?: A Lot Help needed standing up from a chair using your arms (e.g., wheelchair or bedside chair)?: A Lot Help needed to walk in hospital room?: Total Help needed climbing 3-5 steps with a railing? : Total 6 Click Score: 10    End of Session   Activity Tolerance: Patient tolerated treatment well;Patient limited by fatigue Patient left: in bed;with call bell/phone within reach;with nursing/sitter in room;with bed alarm set Nurse Communication: Mobility status PT Visit Diagnosis: Hemiplegia and hemiparesis;Other abnormalities of gait and mobility (R26.89) Hemiplegia - Right/Left: Right Hemiplegia - dominant/non-dominant: Dominant Hemiplegia - caused by: Cerebral infarction     Time: 1027-2536 PT Time Calculation (min) (ACUTE ONLY): 27 min  Charges:  $Neuromuscular Re-education: 8-22 mins                     11/30/2020  [name], PT Acute Rehabilitation Services [phone]  (pager) [phone]  (office)   Tessie Fass Zahria Ding 11/30/2020, 6:00 PM

## 2020-11-30 NOTE — Progress Notes (Signed)
Occupational Therapy Treatment Patient Details Name: Emily Lozano MRN: [id number] DOB: 07-16-1951 Today's Date: 11/30/2020    History of present illness 70 y.o. female with a medical history significant for hypertension, hyperlipidemia, type 2 diabetes mellitus, depression, GERD, melanoma, sleep apnea, and recent exposure to COVID-19 with a negative test at her PCP yesterday but diagnosed with acute bronchitis. Pt presents with acute onset R weakness, aphasia, left gaze. CTA demonstrates L M2 occlusion. Pt underwent thrombectomy on 11/24/2020 (unsuccessful)   OT comments  Pt progressing towards established OT goals. Continues to present with decreased use of RUE, attention to R, balance, strength, and cognition. Pt performing bed mobility with Mod-max A +2 and sitting at EOB with Mod-Max A for support. Pt donning socks at bed level using figure four method with Max A. Pt performing sit<>stand with Max A +2; x5. VSS throughout on 5L. Continue to recommend dc to CIR and will continue to follow acutely as admitted.    Follow Up Recommendations  CIR    Equipment Recommendations  Other (comment);3 in 1 bedside commode;Wheelchair (measurements OT);Wheelchair cushion (measurements OT)    Recommendations for Other Services Rehab consult    Precautions / Restrictions Precautions Precautions: Fall Precaution Comments: COVID postive       Mobility Bed Mobility Overal bed mobility: Needs Assistance Bed Mobility: Rolling;Supine to Sit;Sit to Supine Rolling: Mod assist;Max assist   Supine to sit: Mod assist Sit to supine: Max assist;+2 for physical assistance   General bed mobility comments: Mod A to bring BLEs towards EOB and then elevate trunk. Max A +2 for return to bed.    Transfers Overall transfer level: Needs assistance Equipment used: 2 person hand held assist Transfers: Sit to/from Stand Sit to Stand: Max assist;+2 physical assistance;+2 safety/equipment         General  transfer comment: Max a +2 to power up into standing. Blocking bil knees. Tactile and verbal cues for hip tuck and elevate chest. Performing x5    Balance Overall balance assessment: Needs assistance Sitting-balance support: Feet supported Sitting balance-Leahy Scale: Poor Sitting balance - Comments: pt listed R and posteriorly unless assist to hold midline.  pt tended to grab for rail with L hand and pull left if allowed./ Postural control: Right lateral lean;Posterior lean Standing balance support: During functional activity;Bilateral upper extremity supported Standing balance-Leahy Scale: Poor Standing balance comment: Reliant on physical A and blocking of BLEs                           ADL either performed or assessed with clinical judgement   ADL Overall ADL's : Needs assistance/impaired                     Lower Body Dressing: Maximal assistance;Bed level Lower Body Dressing Details (indicate cue type and reason): Max A to bring ankles to knees and maintain figure four position. Donning socks over toes and then cuing pt to compelte task. Pt requiring hns over hadn to reachwith LUE and pull sock over heel Toilet Transfer: Maximal assistance;+2 for physical assistance (sit<>stand at EOB and then shifting hips)   Toileting- Clothing Manipulation and Hygiene: Total assistance;Bed level Toileting - Clothing Manipulation Details (indicate cue type and reason): Total A for peri care at bed level     Functional mobility during ADLs: Maximal assistance;+2 for physical assistance (sit<>stand at EOB and then shifting hips) General ADL Comments: Pt performing bed mobility to then sit at  EOB with Mod-Max A for support     Vision   Additional Comments: L gaze and head turn; able to look R but with cues   Perception     Praxis      Cognition Arousal/Alertness: Awake/alert Behavior During Therapy: WFL for tasks assessed/performed Overall Cognitive Status:  Impaired/Different from baseline Area of Impairment: Attention;Following commands;Awareness;Problem solving                   Current Attention Level: Sustained   Following Commands: Follows one step commands with increased time   Awareness: Intellectual Problem Solving: Slow processing;Requires verbal cues;Requires tactile cues General Comments: Following one step commands 75% of time. Requiring increased time and cues throughotu. Making eye contact. Inattention to R        Exercises     Shoulder Instructions       General Comments SpO2 90s on 5L. HR 70-80s. BP stable. RR 20s.    Pertinent Vitals/ Pain       Pain Assessment: Faces Faces Pain Scale: No hurt Pain Intervention(s): Monitored during session;Limited activity within patient's tolerance;Repositioned  Home Living Family/patient expects to be discharged to:: Private residence Living Arrangements: Children                                      Prior Functioning/Environment              Frequency  Min 2X/week        Progress Toward Goals  OT Goals(current goals can now be found in the care plan section)  Progress towards OT goals: Progressing toward goals  Acute Rehab OT Goals Patient Stated Goal: Pt unstable to state OT Goal Formulation: Patient unable to participate in goal setting Time For Goal Achievement: 12/10/20 Potential to Achieve Goals: Good ADL Goals Pt Will Perform Grooming: with mod assist;sitting Pt Will Perform Upper Body Dressing: with mod assist;sitting Pt Will Transfer to Toilet: with mod assist;ambulating;bedside commode;with +2 assist Additional ADL Goal #1: Pt will tolerate sitting at EOB for 10 minutes with Mod A in preparation for ADLs Additional ADL Goal #2: Pt will follow one step commands during ADLs with MIn cues  Plan Discharge plan remains appropriate    Co-evaluation    PT/OT/SLP Co-Evaluation/Treatment: Yes Reason for Co-Treatment: For  patient/therapist safety;To address functional/ADL transfers   OT goals addressed during session: ADL's and self-care      AM-PAC OT "6 Clicks" Daily Activity     Outcome Measure   Help from another person eating meals?: Total Help from another person taking care of personal grooming?: A Little Help from another person toileting, which includes using toliet, bedpan, or urinal?: Total Help from another person bathing (including washing, rinsing, drying)?: A Lot Help from another person to put on and taking off regular upper body clothing?: A Lot Help from another person to put on and taking off regular lower body clothing?: A Lot 6 Click Score: 11    End of Session Equipment Utilized During Treatment: Oxygen (5L via HFNC)  OT Visit Diagnosis: Unsteadiness on feet (R26.81);Other abnormalities of gait and mobility (R26.89);Muscle weakness (generalized) (M62.81);Pain;Hemiplegia and hemiparesis Hemiplegia - Right/Left: Right Hemiplegia - dominant/non-dominant: Dominant Hemiplegia - caused by: Cerebral infarction Pain - part of body:  (Generalized)   Activity Tolerance Patient tolerated treatment well   Patient Left in bed;with call bell/phone within reach;with bed alarm set   Nurse Communication Mobility  status;Precautions        Time: 9150-4136 OT Time Calculation (min): 27 min  Charges: OT General Charges $OT Visit: 1 Visit OT Treatments $Self Care/Home Management : 8-22 mins  [place], OTR/L Acute Rehab Pager: [phone] Office: [place] 11/30/2020, 5:23 PM

## 2020-11-30 NOTE — Progress Notes (Signed)
ANTICOAGULATION CONSULT NOTE - Follow Up Consult  Pharmacy Consult for Heparin Indication: RLE DVT  Allergies  Allergen Reactions  . Morphine And Related Nausea And Vomiting    Patient Measurements: Height: 5\' 4"  (162.6 cm) Weight: 71.1 kg (156 lb 12 oz) IBW/kg (Calculated) : 54.7 Heparin Dosing Weight:   Vital Signs: Temp: 98.1 F (36.7 C) (03/04 1600) Temp Source: Oral (03/04 1600) BP: 130/67 (03/04 1830) Pulse Rate: 80 (03/04 1830)  Labs: Recent Labs    11/28/20 0215 11/29/20 0556 11/29/20 2011 11/30/20 0311 11/30/20 1818  HGB 10.8* 11.2*  --  11.2*  --   HCT 33.5* 36.6  --  35.1*  --   PLT 481* 515*  --  446*  --   HEPARINUNFRC 0.43 0.59 0.44 0.51 0.43  CREATININE 0.68 0.60  --  0.61  --     Estimated Creatinine Clearance: 64.2 mL/min (by C-G formula based on SCr of 0.61 mg/dL).   Assessment: Anticoag: Heparin for RLE DVT, CVA - HL 0.43 remains in goal range.  Goal of Therapy:  Heparin level 0.3-0.5 units/ml Monitor platelets by anticoagulation protocol: Yes   Plan:  Con't heparin gtt 1300 units/hr Daily heparin level and CBC   Crystalina Stodghill S. Alford Highland, PharmD, BCPS Clinical Staff Pharmacist [email or web address] Alford Highland, [place] 11/30/2020,7:09 PM

## 2020-11-30 NOTE — Progress Notes (Addendum)
STROKE TEAM PROGRESS NOTE   INTERVAL HISTORY More alert today. Dense aphasia persists.  Mimics  for exam some of the time. Weaned off Bipap yesterday but had to be restarted overnight. Currently on high flow oxygen by nasal cannula.  Resp status somewhat tenuous. Remains on heparin gtt.  Requring cleviprex (intermittently).  Amiodarone drip weaned off. Passed swallow  On FEES today cleared for dysphagia diet.   Vitals:   11/30/20 0830 11/30/20 0900 11/30/20 0930 11/30/20 1000  BP: (!) 152/72 (!) 153/73 (!) 161/73 (!) 129/56  Pulse: 65 76 74 81  Resp: (!) 21 (!) 22 (!) 23 (!) 24  Temp:      TempSrc:      SpO2: 97% 95% 91% (!) 85%  Weight:      Height:       CBC:  Recent Labs  Lab 11/29/20 0556 11/30/20 0311  WBC 11.3* 8.7  NEUTROABS 8.4* 6.4  HGB 11.2* 11.2*  HCT 36.6 35.1*  MCV 79.4* 77.7*  PLT 515* 371*   Basic Metabolic Panel:  Recent Labs  Lab 11/26/20 0413 11/27/20 0701 11/27/20 1544 11/28/20 0215 11/29/20 0556 11/30/20 0311  NA 139  --    < > 144 142 136  K 3.9  --    < > 3.3* 3.8 3.6  CL 109  --    < > 110 108 104  CO2 15*  --    < > 23 23 24   GLUCOSE 301*  --    < > 283* 116* 135*  BUN 10  --    < > 18 14 20   CREATININE 0.72  --    < > 0.68 0.60 0.61  CALCIUM 8.1*  --    < > 8.5* 8.6* 8.2*  MG 2.3  --   --  2.1  --   --   PHOS 2.7 1.7*  --  2.5  --   --    < > = values in this interval not displayed.   Lipid Panel:  Recent Labs  Lab 11/25/20 0608 11/26/20 0413 11/28/20 0215  CHOL 139  --   --   TRIG 418*   < > 294*  HDL 15*  --   --   CHOLHDL 9.3  --   --   VLDL UNABLE TO CALCULATE IF TRIGLYCERIDE OVER 400 mg/dL  --   --   LDLCALC UNABLE TO CALCULATE IF TRIGLYCERIDE OVER 400 mg/dL  --   --    < > = values in this interval not displayed.   HgbA1c:  Recent Labs  Lab 11/25/20 0608  HGBA1C 10.6*   IMAGING past 24 hours No results found.  PHYSICAL EXAM Calm elderly Caucasian lady.  Mild extra work of breathing. Afebrile. Head is nontraumatic.  Neck is supple without bruit.    Cardiac exam no murmur or gallop. Lungs are clear to auscultation. Distal pulses are well felt.  Neurological Exam :  Mental Status: Patient is awake and has receptive aphasia and is following very few midline commands inconsistently.  Moves her left arm and leg purposefully. There is some evidence of right-sided neglect on examination.  Blinks to threat on the left. Pupils are equal, round, and reactive to light with left gaze preference but able to cross midline upon stimulus like light or noise.  Opens eyes to gentle sensory stimulus of face.VII: Partial right facial palsy noted with right mouth and nasolabial fold involvement.  VIII: Hearing is intact to voice X: Phonation intact XI:  Head appears midline XII: Patient does not protrude tongue to command.  Motor: Tone is normal. Bulk is normal. Left upper and lower extremity 4/5 strength without pronator drift. Right upper flicker and lower extremity withdraws briskly to painful stimulus.  Sensory: Sensation unable to test due to cooperation  Cerebellar: Unable to assess with significant weakness on the right upper and lower extremities   ASSESSMENT/PLAN [name] is a 70 y.o. female Hx of multiple cerebrovascular risk factors presenting with sudden onset of left gaze preference, right facial droop, hemiparesis and expressive aphasia still with left hemispheric dysfunction. CTA head and neck with left M2 occlusion and though outside the window for IV TPA was a candidate for intervention now s/p unsuccessful revascularization, though there was some distal movement of the clot from M2 to M3; TIKI3. Now intubated and mildly sedated but awake not moving right side or following commands.  She is also Covid positive with peroneal vein thrombosis on lower extremity Dopplers and she is on IV heparin.  Continue close neurological monitoring and heparin level.     Stroke:  left M2 territory infarct embolic  secondary to atrial fibrillation s/p unsuccessful recanalization Code Stroke CT head Acute infarct involving the left insular cortex.2. Chronic microvascular ischemic changes. ASPECTS is 9 CTA head and neck Patent ophthalmic artery origins. RightA1 segment hypoplasia. Patent ACAs. Patent right MCA. Left M2 segment occlusion. 30-40% distal left CCA narrowing secondary to atheromatous plaque. Mild narrowing of the proximal left subclavian and left vertebral artery origin. Bilateral upper lung nodular/patchy opacities concerning for infectious/inflammatory foci.  CT perfusion no core and 30cc ischemia.  MRI  Acute infarction in the left MCA territory affecting the deep insula and left frontoparietal cortical and subcortical brain. Minimal petechial blood products or susceptibility artifact related to thrombosed vessels. 2D Echo 55 to 60%. The left ventricle has normal function. The left ventricle has no regional wall motion abnormalities, PFO or LVT.  LDL unable to calculate, triglycerides 312  HgbA1c 10.6  VTE prophylaxis - SCDs Tube feeds  aspirin 81 mg daily prior to admission, now on aspirin 325 per tube.  Therapy recommendations:  pending  Disposition:  TBD  Hypertension  Home meds: Please refer to chart summary.  Stable on cleviprex, wean to off . SBP goal <160 but gradually normalize in 5-7 days . Metoprolol 50mg  bid, hydrochlorothiazide 12.5mg  daily . Hydralazine prn . Long-term BP goal normotensive  Atrial fibrillation  Amiodarone infusion  Rate control with Metoprolol 50bid  Hyperlipidemia  Home meds:  Rosuvastatin 10mg , resumed in hospital  LDL goal < 70  Order direct LDL: 70  Triglycerides 294  Continue statin at discharge  Diabetes type II uncontrolled  Home meds:  glimepiride (AMARYL) metFORMIN (GLUCOPHAGE)   HgbA1c 10.6, goal < 7.0  Levemir 20units bid and increase Novolog to 8units q 4  CBGs Recent Labs    11/29/20 2329 11/30/20 0403  11/30/20 0928  GLUCAP 182* 142* 103*      SSI  Other Stroke Risk Factors  Advanced Age >/= 25   former Cigarette smoker  ETOH use, alcohol level unknown  Substance abuse - UDS:  THC No results found for requested labs within last 26280 hours., Cocaine No results found for requested labs within last 26280 hours.. Patient advised to stop using due to stroke risk.  Obesity, Body mass index is 26.91 kg/m., BMI >/= 30 associated with increased stroke risk, recommend weight loss, diet and exercise as appropriate   Other Active Problems Acute  Hypoxic Respiratory Failure/Covid 19 positive/Pneumonia  Required mechanical ventialtion due to stroke and possibly pulmonary Covid-19 pneumonia  Extubated 3/2. Bipap 3/3 and overnight into 3/4.   Continue Remdesivir  Steroids for symptomatic Covid-19 pneumonia per CCM   fever: tmax 99,WBC 11.3 stable   Likely due to Covid-19 pneumonia  Acute deep vein thrombosis   Right peroneal veins which is at risk for extension and PE and will need anticoagulation. Though the stroke is moderate in size and there is some risk for hemorrhage, the benefit to anticoagulation outweighs the risk of PE and recommend low intensity, no bolus heparin with close neurologic monitoring -   Heparin gtt to 1350 units/hr  Check 6 hr aPTT and heparin level  Daily CBC stable   Hypokalemia  Potassium level 3.4->3.8   Continue mobilization out of bed.  Speech therapy has done fees and cleared her for diet today.  Continue monitoring respiratory status and if stable will consider transfer out of ICU later today and will ask medical hospitalist team to pick up care after she leaves ICU..   .  Continue IV heparin.  For now but may need to transition to Eliquis if she is consistently eating well in the next few days.  Long discussion with patient's Rn at the bedside about her prognosis and plan of care and answered questions.  Discussed with NP critical care medicine  team.   This patient is critically ill and at significant risk of neurological worsening, death and care requires constant monitoring of vital signs, hemodynamics,respiratory and cardiac monitoring, extensive review of multiple databases, frequent neurological assessment, discussion with family, other specialists and medical decision making of high complexity.I have made any additions or clarifications directly to the above note.This critical care time does not reflect procedure time, or teaching time or supervisory time of PA/NP/Med Resident etc but could involve care discussion time.  I spent 30 minutes of neurocritical care time  in the care of  this patient.         Antony Contras, MD Medical Director [place] Pager: [phone] 11/30/2020 10:31 AM   To contact Stroke Continuity provider, please refer to [email or web address] After hours, contact General Neurology

## 2020-11-30 NOTE — Procedures (Signed)
Objective Swallowing Evaluation: Type of Study: FEES-Fiberoptic Endoscopic Evaluation of Swallow   Patient Details  Name: Emily Lozano MRN: [id number] Date of Birth: 05/29/1951  Today's Date: 11/30/2020 Time: SLP Start Time (ACUTE ONLY): 0910 -SLP Stop Time (ACUTE ONLY): 0950  SLP Time Calculation (min) (ACUTE ONLY): 40 min   Past Medical History:  Past Medical History:  Diagnosis Date  . Anemia   . Arthritis   . 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   . Depression   . Diabetes mellitus without complication ([place])   . Hypertension   . Melanoma ([place]) 2014   resected from Left forearm.    Past Surgical History:  Past Surgical History:  Procedure Laterality Date  . ABDOMINAL HYSTERECTOMY    . BREAST LUMPECTOMY    . CARPAL TUNNEL RELEASE    . ESOPHAGEAL MANOMETRY N/A 12/05/2015   Procedure: ESOPHAGEAL MANOMETRY (EM);  Surgeon: Josefine Class, MD;  Location[facility] ENDOSCOPY;  Service: Endoscopy;  Laterality: N/A;  . ESOPHAGOGASTRODUODENOSCOPY (EGD) WITH PROPOFOL N/A 11/26/2015   Procedure: ESOPHAGOGASTRODUODENOSCOPY (EGD) WITH PROPOFOL;  Surgeon: Lollie Sails, MD;  Location: [facility] ENDOSCOPY;  Service: Endoscopy;  Laterality: N/A;  . ESOPHAGOGASTRODUODENOSCOPY (EGD) WITH PROPOFOL N/A 04/30/2017   Procedure: ESOPHAGOGASTRODUODENOSCOPY (EGD) WITH PROPOFOL;  Surgeon: Lollie Sails, MD;  Location: [facility] ENDOSCOPY;  Service: Endoscopy;  Laterality: N/A;  . EXCISION MELANOMA WITH SENTINEL LYMPH NODE BIOPSY    . IR CT HEAD LTD  11/24/2020  . IR CT HEAD LTD  11/24/2020  . IR PERCUTANEOUS ART THROMBECTOMY/INFUSION INTRACRANIAL INC DIAG ANGIO  11/24/2020  . IR US GUIDE VASC ACCESS RIGHT  11/24/2020  . RADIOLOGY WITH ANESTHESIA N/A 11/24/2020   Procedure: IR WITH ANESTHESIA;  Surgeon: Radiologist, Medication, MD;  Location: [place];  Service: Radiology;  Laterality: N/A;   HPI: Emily Lozano is a 70 y.o. female Hx of multiple cerebrovascular risk factors  presenting with sudden onset of left gaze preference, right facial droop, hemiparesis and expressive aphasia still with left hemispheric dysfunction. She is also Covid positive with peroneal vein thrombosis on lower extremity Dopplers and she is on IV heparin. Intubated from 2/26-3/2 also with stridor following extubation. MRI shows Acute infarction in the left MCA territory affecting the deep insula and left frontoparietal cortical and subcortical brain.   No data recorded   Assessment / Plan / Recommendation  CHL IP CLINICAL IMPRESSIONS 11/30/2020  Clinical Impression Pt demonstrates a primarly oral dysphagia with decreased labial seal, right buccal tension and lingual coordiation. She is unable to follow more complex commands to cough and clear throat due to apraxia. Poor oral coordination leads to anterior spillage on the right, right sided oral residue, and premature spillage of liquids. This leads to episodes of frank flash and sensed aspiration of nectar thick liquids before the swallow. There is high penetration of honey and puree and mild difuse pharyngeal residue with these texture which pt clears with subsequent swallows. Additionally pt noted to have severe edema of the right arytenoid and it is partially overhanging the glottis. There are bilateral excresence on the posterior portion of the vocal folds likely from intubation. Image given to CCM and posted in their note. Edema is not impacting swallowing at this time, but is impacting her phonation, coughing and breathing. Recommend a Dys 1/honey thick diet at this time, pt is eager to eat and drink.  SLP Visit Diagnosis Dysphagia, oral phase (R13.11);Dysphagia, oropharyngeal phase (R13.12)  Attention and concentration deficit following --  Frontal lobe and executive function deficit following --  Impact on safety and function Mild aspiration risk      CHL IP TREATMENT RECOMMENDATION 11/30/2020  Treatment Recommendations Therapy as outlined  in treatment plan below     No flowsheet data found.  CHL IP DIET RECOMMENDATION 11/30/2020  SLP Diet Recommendations Dysphagia 1 (Puree) solids;Honey thick liquids  Liquid Administration via Cup;Spoon  Medication Administration Crushed with puree  Compensations Slow rate;Small sips/bites  Postural Changes Remain semi-upright after after feeds/meals (Comment);Seated upright at 90 degrees      CHL IP OTHER RECOMMENDATIONS 11/30/2020  Recommended Consults --  Oral Care Recommendations Oral care BID  Other Recommendations Have oral suction available      CHL IP FOLLOW UP RECOMMENDATIONS 11/30/2020  Follow up Recommendations Inpatient Rehab      CHL IP FREQUENCY AND DURATION 11/30/2020  Speech Therapy Frequency (ACUTE ONLY) min 2x/week  Treatment Duration 2 weeks           CHL IP ORAL PHASE 11/30/2020  Oral Phase Impaired  Oral - Pudding Teaspoon --  Oral - Pudding Cup --  Oral - Honey Teaspoon Right anterior bolus loss;Weak lingual manipulation;Decreased bolus cohesion;Premature spillage;Lingual/palatal residue  Oral - Honey Cup Right anterior bolus loss;Weak lingual manipulation;Decreased bolus cohesion;Premature spillage;Lingual/palatal residue  Oral - Nectar Teaspoon --  Oral - Nectar Cup Right anterior bolus loss;Weak lingual manipulation;Decreased bolus cohesion;Premature spillage;Lingual/palatal residue  Oral - Nectar Straw --  Oral - Thin Teaspoon --  Oral - Thin Cup --  Oral - Thin Straw --  Oral - Puree Right anterior bolus loss;Weak lingual manipulation;Decreased bolus cohesion;Premature spillage;Lingual/palatal residue  Oral - Mech Soft --  Oral - Regular --  Oral - Multi-Consistency --  Oral - Pill --  Oral Phase - Comment --    CHL IP PHARYNGEAL PHASE 11/30/2020  Pharyngeal Phase Impaired  Pharyngeal- Pudding Teaspoon --  Pharyngeal --  Pharyngeal- Pudding Cup --  Pharyngeal --  Pharyngeal- Honey Teaspoon Penetration/Aspiration before swallow;Lateral channel  residue;Pharyngeal residue - valleculae;Pharyngeal residue - pyriform;Inter-arytenoid space residue  Pharyngeal Material enters airway, remains ABOVE vocal cords then ejected out;Material does not enter airway  Pharyngeal- Honey Cup Penetration/Aspiration before swallow;Lateral channel residue;Pharyngeal residue - valleculae;Pharyngeal residue - pyriform;Inter-arytenoid space residue  Pharyngeal Material enters airway, remains ABOVE vocal cords then ejected out  Pharyngeal- Nectar Teaspoon Penetration/Aspiration before swallow;Lateral channel residue;Pharyngeal residue - valleculae;Pharyngeal residue - pyriform;Inter-arytenoid space residue  Pharyngeal Material enters airway, CONTACTS cords and then ejected out;Material enters airway, passes BELOW cords then ejected out  Pharyngeal- Nectar Cup Penetration/Aspiration before swallow;Lateral channel residue;Pharyngeal residue - valleculae;Pharyngeal residue - pyriform;Inter-arytenoid space residue  Pharyngeal Material enters airway, CONTACTS cords and then ejected out;Material enters airway, passes BELOW cords then ejected out  Pharyngeal- Nectar Straw --  Pharyngeal --  Pharyngeal- Thin Teaspoon --  Pharyngeal --  Pharyngeal- Thin Cup --  Pharyngeal --  Pharyngeal- Thin Straw --  Pharyngeal --  Pharyngeal- Puree Lateral channel residue;Pharyngeal residue - valleculae;Pharyngeal residue - pyriform;Inter-arytenoid space residue  Pharyngeal --  Pharyngeal- Mechanical Soft --  Pharyngeal --  Pharyngeal- Regular --  Pharyngeal --  Pharyngeal- Multi-consistency --  Pharyngeal --  Pharyngeal- Pill --  Pharyngeal --  Pharyngeal Comment --     No flowsheet data found.   DeBlois, Katherene Ponto 11/30/2020, 10:58 AM

## 2020-11-30 NOTE — Progress Notes (Signed)
NAME:  Emily Lozano, MRN:  [id number], DOB:  January 16, 1951, LOS: 4 ADMISSION DATE:  11/24/2020, CONSULTATION DATE:  11/24/2020 REFERRING MD:  Rory Percy - neuro, CHIEF COMPLAINT:  L M2 CVA, Covid infection, respiratory failure  Brief History:  70 yo admitted 2/26 with L M2 occlusion, outside of tpa window. Went to [facility] for thrombectomy, unsuccessful, c/b bleed.Resulted + for Covid in NIR, will remain intubated. Reportedly has had some mild URI sx x 1-2 wks (had tested negative OP in this time frame). hypertension, hyperlipidemia, type 2 diabetes mellitus, depression, GERD, melanoma, sleep apnea, and recent exposure to COVID-19  History of Present Illness:  70 yo F PMH HTN HLD Dm2 who presented as a code stroke 2/26 with acute R hemiparesis, L gaze preference, aphasia. KWN 10:30 AM -- outside of window for tpa. CTA head reveals L M2 occlusion, CTA cp with L MCA ischemia. Taken to [facility] for thrombectomy -- c/b bleed, thrombectomy unsuccessful.In NIR, COVID-19 resulted positive. Reportedly, pts family had +Covid recently, pt had URI sx and was tested for COVID 2/25 - and was negative, started on ZPack at that time.  Repeat neuroimaging is pending during [place] will be to remain intubated following case and transfer to ICU. PCCM consulted in this setting   Past Medical History:  HTN HLD DM2   Significant Hospital Events:  11/24/2020 - admit => Left distal M2/MCA middle division branch occlusion vi RCFA. :There was a left M2/MCA middle division branch occlusion with likely atherosclerotic plaque underlying. Two direct contact aspiration passes performed with recanalization of the proximal aspect of the M2 branch and persistent distal occlusion. Two passes were then performed with stent retriever with persistent occlusion. Flat panel head CT showed contrast extravasation in the left sylvian fissure. Delayed angiograms and delayed flat panel CT showed no evidence of active extravasation.  Consults:   11/24/2020  - CCM  Procedures:  2/26 - see events 3/2 - Extubated   Significant Diagnostic Tests:  2.26 - see events  Micro Data:  11/24/2020 - covid +  Antimicrobials:  2/26 - remdesivir  Interim History / Subjective:  She is much more alert this morning however still aphasic and tries to mumble  Objective   Blood pressure (!) 147/75, pulse 76, temperature 99.1 F (37.3 C), temperature source Axillary, resp. rate 20, height 5\' 4"  (1.626 m), weight 71.1 kg, SpO2 95 %.    Vent Mode: PCV;BIPAP FiO2 (%):  [30 %] 30 % Set Rate:  [12 bmp] 12 bmp PEEP:  [6 cmH20] 6 cmH20 Pressure Support:  [6 cmH20] 6 cmH20   Intake/Output Summary (Last 24 hours) at 11/30/2020 0650 Last data filed at 11/30/2020 0600 Gross per 24 hour  Intake 1440.55 ml  Output 1200 ml  Net 240.55 ml   Filed Weights   11/24/20 1909 11/26/20 0253 11/30/20 0406  Weight: 80 kg 71.2 kg 71.1 kg    Examination: Const: In no apparent distress, lying comfortably in bed HEENT:  Comfortable on BiPAP  Resp: CTA BL, no wheezes, crackles, rhonchi CV: RRR, no MGR Abd: Bowel sounds present, nondistended, nontender to palpation Ext: No lower extremity edema, skin is warm to touch Neuro: Strength 0/5 RUE, RLE. 4/5 LUE, LLE.  Speech still remains aphasic  Resolved Hospital Problem list   Acute respiratory failure  Assessment & Plan:  #Acute ischemic stroke Feb 26th, Left M2 MCA.  She passed her FEES this morning and will thus not pursue Cortrak placement. -Appreciate neurology recommendations -If there is  no improvement, will likely place a core track -Aspirin 325 mg daily -Secondary stroke prevention and rehabilitation   #Hypertension  -Add lisinopril 5 mg daily today -Hydralazine prn -Hydrochlorothiazide 12.5 mg daily -Metoprolol 50 mg twice daily   #Atrial Fibrillation with RVR (CHA2DS2-VASc) score 5-6 Now in SR -Continue metoprolol 50 mg twice daily -Patient already on heparin for acute LE  DVT -Discussed with neurology if she can transition to [place] as no evidence of valvular abnormality    #Acute respiratory failure post op due to stroke in setting of covid  Resolved, extubated on 3/2  -Wean O2 as needed   #COVID 19 positive, sympomatic prior to admission -Finished course of remdesivir -Continue Decadron   #Acute lower extremity DVT -Continue heparin infusion   #Diabetes mellitus, complicated initially by hyperglycemia. At risk for hypoglycemia  Goal BG 140-180 -SSI every 4 -Decrease NovoLog to 5 units every 4 hours -Levemir 20 units twice daily   #?Laryngeal edema She was found to have possible right vocal cord edema that was noticed during SLP evaluation.  She is already on Decadron for her Covid infection.  Continue Decadron and assess her swallow function.  If there is no improvement, will get ENTs input.    #History of Rheumatoid arthritis #Depression/Anxiety    Best practice (evaluated daily)  Diet: Aphasic, remains n.p.o.  NG tube today Pain/Anxiety/Delirium protocol (if indicated): Off all sedation VAP protocol (if indicated): yes DVT prophylaxis: scd + IV heparin due to recent DVT GI prophylaxis: ppi Glucose control: poorly controlled due to steroid induced will increase insulin detemir Mobility: PT OT. Disposition: ICU  Family update: Family at bedside  Goals of Care:  Per Primary Team  Labs   CBC: Recent Labs  Lab 11/24/20 1602 11/24/20 1604 11/26/20 1051 11/27/20 0701 11/28/20 0215 11/29/20 0556 11/30/20 0311  WBC 5.5   < > 13.8* 11.4* 11.0* 11.3* 8.7  NEUTROABS 4.1  --  11.0*  --  8.8* 8.4* 6.4  HGB 11.7*   < > 11.3* 10.9* 10.8* 11.2* 11.2*  HCT 37.5   < > 35.4* 34.4* 33.5* 36.6 35.1*  MCV 80.6   < > 80.1 77.1* 76.8* 79.4* 77.7*  PLT 403*   < > 576* 474* 481* 515* 446*   < > = values in this interval not displayed.    Basic Metabolic Panel: Recent Labs  Lab 11/25/20 0608 11/26/20 0413 11/27/20 0701 11/27/20 1544  11/28/20 0215 11/29/20 0556 11/30/20 0311  NA 143 139  --  142 144 142 136  K 2.8* 3.9  --  3.9 3.3* 3.8 3.6  CL 107 109  --  108 110 108 104  CO2 23 15*  --  22 23 23 24   GLUCOSE 202* 301*  --  366* 283* 116* 135*  BUN 9 10  --  16 18 14 20   CREATININE 0.89 0.72  --  0.77 0.68 0.60 0.61  CALCIUM 8.1* 8.1*  --  8.2* 8.5* 8.6* 8.2*  MG 1.9 2.3  --   --  2.1  --   --   PHOS 2.7 2.7 1.7*  --  2.5  --   --    GFR: Estimated Creatinine Clearance: 64.2 mL/min (by C-G formula based on SCr of 0.61 mg/dL). Recent Labs  Lab 11/24/20 2058 11/25/20 0544 11/25/20 6503 11/26/20 1051 11/27/20 0701 11/28/20 0215 11/29/20 0556 11/30/20 0311  PROCALCITON <0.10  --  <0.10  --   --   --   --   --  WBC 5.3  --   --    < > 11.4* 11.0* 11.3* 8.7  LATICACIDVEN 2.4* 2.5*  --   --   --   --   --   --    < > = values in this interval not displayed.    Liver Function Tests: Recent Labs  Lab 11/24/20 1602 11/25/20 0608  AST 18 17  ALT 13 14  ALKPHOS 55 55  BILITOT 0.7 <0.1*  PROT 6.9 6.3*  ALBUMIN 2.9* 2.5*   No results for input(s): LIPASE, AMYLASE in the last 168 hours. No results for input(s): AMMONIA in the last 168 hours.  ABG    Component Value Date/Time   PHART 7.418 11/24/2020 2036   PCO2ART 37.4 11/24/2020 2036   PO2ART 97 11/24/2020 2036   HCO3 24.1 11/24/2020 2036   TCO2 25 11/24/2020 2036   O2SAT 98.0 11/24/2020 2036     Coagulation Profile: Recent Labs  Lab 11/24/20 1602  INR SPECIMEN CLOTTED    Cardiac Enzymes: No results for input(s): CKTOTAL, CKMB, CKMBINDEX, TROPONINI in the last 168 hours.  HbA1C: Hgb A1c MFr Bld  Date/Time Value Ref Range Status  11/25/2020 06:08 AM 10.6 (H) 4.8 - 5.6 % Final    Comment:    (NOTE) Pre diabetes:          5.7%-6.4%  Diabetes:              >6.4%  Glycemic control for   <7.0% adults with diabetes   11/24/2020 08:58 PM 10.4 (H) 4.8 - 5.6 % Final    Comment:    (NOTE) Pre diabetes:          5.7%-6.4%  Diabetes:               >6.4%  Glycemic control for   <7.0% adults with diabetes     CBG: Recent Labs  Lab 11/29/20 1144 11/29/20 1503 11/29/20 1934 11/29/20 2329 11/30/20 0403  GLUCAP 96 96 182* 182* 142*    Review of Systems:   As per HPI  Past Medical History:  She,  has a past medical history of Anemia, Arthritis, Collagen vascular disease ([place]), Depression, Diabetes mellitus without complication ([place]), Hypertension, and Melanoma ([place]) (2014).   Surgical History:   Past Surgical History:  Procedure Laterality Date  . ABDOMINAL HYSTERECTOMY    . BREAST LUMPECTOMY    . CARPAL TUNNEL RELEASE    . ESOPHAGEAL MANOMETRY N/A 12/05/2015   Procedure: ESOPHAGEAL MANOMETRY (EM);  Surgeon: Josefine Class, MD;  Location: [facility] ENDOSCOPY;  Service: Endoscopy;  Laterality: N/A;  . ESOPHAGOGASTRODUODENOSCOPY (EGD) WITH PROPOFOL N/A 11/26/2015   Procedure: ESOPHAGOGASTRODUODENOSCOPY (EGD) WITH PROPOFOL;  Surgeon: Lollie Sails, MD;  Location: [facility] ENDOSCOPY;  Service: Endoscopy;  Laterality: N/A;  . ESOPHAGOGASTRODUODENOSCOPY (EGD) WITH PROPOFOL N/A 04/30/2017   Procedure: ESOPHAGOGASTRODUODENOSCOPY (EGD) WITH PROPOFOL;  Surgeon: Lollie Sails, MD;  Location: [facility] ENDOSCOPY;  Service: Endoscopy;  Laterality: N/A;  . EXCISION MELANOMA WITH SENTINEL LYMPH NODE BIOPSY    . IR CT HEAD LTD  11/24/2020  . IR CT HEAD LTD  11/24/2020  . IR PERCUTANEOUS ART THROMBECTOMY/INFUSION INTRACRANIAL INC DIAG ANGIO  11/24/2020  . IR US GUIDE VASC ACCESS RIGHT  11/24/2020  . RADIOLOGY WITH ANESTHESIA N/A 11/24/2020   Procedure: IR WITH ANESTHESIA;  Surgeon: Radiologist, Medication, MD;  Location: [place];  Service: Radiology;  Laterality: N/A;     Social History:   reports that she has quit smoking. She has never used  smokeless tobacco. She reports that she does not drink alcohol and does not use drugs.   Family History:  Her family history is not on file.   Allergies Allergies  Allergen Reactions  .  Morphine And Related Nausea And Vomiting     Home Medications  Prior to Admission medications   Medication Sig Start Date End Date Taking? Authorizing Provider  amLODipine (NORVASC) 5 MG tablet Take 5 mg by mouth daily. 10/23/20  Yes [provider]  aspirin EC 81 MG tablet Take 81 mg by mouth daily.   Yes [provider]  buPROPion (WELLBUTRIN XL) 300 MG 24 hr tablet Take 300 mg by mouth daily.   Yes [provider]  Dextromethorphan-guaiFENesin (ROBITUSSIN COUGH+CHEST CONG DM) 20-200 MG/20ML LIQD Take 20 mLs by mouth 2 (two) times daily as needed (cough/congestion).   Yes [provider]  dicyclomine (BENTYL) 10 MG capsule Take 10 mg by mouth 3 (three) times daily as needed (diarrhea).   Yes [provider]  DM-APAP-CPM (CORICIDIN HBP PO) Take 1 tablet by mouth 2 (two) times daily as needed (cough congestion).   Yes [provider]  glimepiride (AMARYL) 4 MG tablet Take 4 mg by mouth See admin instructions. Take one tablet (4 mg) by mouth every morning, take an additional tablet (4 mg) for CBG 250-300 10/23/20  Yes [provider]  hydroxychloroquine (PLAQUENIL) 200 MG tablet Take 200-400 mg by mouth See admin instructions. Take 2 tablets (400 mg) by mouth 1st day, then take 1 tablet (200 mg) 2nd day, then repeat alternating days   Yes [provider]  losartan-hydrochlorothiazide (HYZAAR) 100-12.5 MG tablet Take 1 tablet by mouth daily.   Yes [provider]  metFORMIN (GLUCOPHAGE) 500 MG tablet Take 500 mg by mouth 2 (two) times daily with a meal.   Yes [provider]  metoprolol succinate (TOPROL-XL) 100 MG 24 hr tablet Take 100 mg by mouth daily. Take with or immediately following a meal.   Yes [provider]  naproxen sodium (ALEVE) 220 MG tablet Take 220 mg by mouth 2 (two) times daily as needed (pain/headache).   Yes [provider]  pantoprazole (PROTONIX) 40 MG tablet Take 40 mg by  mouth 2 (two) times daily.   Yes [provider]  rosuvastatin (CRESTOR) 10 MG tablet Take 10 mg by mouth daily.   Yes [provider]  Ascorbic Acid (VITAMIN C) 1000 MG tablet Take 1,000 mg by mouth daily.    [provider]  azithromycin (ZITHROMAX) 250 MG tablet Take 250-500 mg by mouth See admin instructions. Take 2 tablets (500 mg) by mouth 1st day, then take 1 tablet (250 mg) daily on days 2-5 11/23/20   [provider]  benzonatate (TESSALON) 200 MG capsule Take 200 mg by mouth 3 (three) times daily as needed for cough. 11/23/20   [provider]  predniSONE (DELTASONE) 20 MG tablet Take 20 mg by mouth daily. 11/23/20   [provider]  Zinc 50 MG TABS Take 50 mg by mouth daily.    [provider]     Critical care time:

## 2020-11-30 NOTE — Progress Notes (Signed)
Assisted tele visit to patient with family member.  Emily Lozano M, RN   

## 2020-11-30 NOTE — Progress Notes (Signed)
Nutrition Follow-up  DOCUMENTATION CODES:   Not applicable  INTERVENTION:   Encourage PO intake, concern for adequacy of PO intake due to level of dysphagia.  Pt on very limited diet, will continue to supplement as appropriate.    NUTRITION DIAGNOSIS:   Inadequate oral intake related to dysphagia as evidenced by meal completion < 50%. Ongoing.   GOAL:   Patient will meet greater than or equal to 90% of their needs Progressing.   MONITOR:   PO intake,Supplement acceptance,Diet advancement  REASON FOR ASSESSMENT:   Consult,Ventilator Enteral/tube feeding initiation and management  ASSESSMENT:   Pt with PMH of HTN, HLD, DM, melanoma, and recent COVID positive with new acute onset of R hemiparesis and aphasia now admitted with L MCA ischemic stroke out of the window for tPA s/p IR achieving TICI 2b revascularization.    Pt discussed during ICU rounds and with RN.  Pt currently COVID positive. No family present.  Able to pass swallow eval and started on PO diet.  Per SLP: She is unable to follow more complex commands to cough and clear throat due to apraxia. Poor oral coordination leads to anterior spillage on the right, right sided oral residue, and premature spillage of liquids. This leads to episodes of frank flash and sensed aspiration of nectar thick liquids before the swallow. There is high penetration of honey and puree and mild difuse pharyngeal residue with these texture which pt clears with subsequent swallows.   3/2 extubated 3/3 failed swallow, RN unable to place NG tube x 2, plan for cortrak next date  3/4 passed FEES, started on Dysphagia 1 with honey thickened liquids   Medications reviewed and include: decadron, SSI, novolog, levemir Labs reviewed:  CBG's: 103-182    Diet Order:   Diet Order            DIET - DYS 1 Room service appropriate? No; Fluid consistency: Honey Thick  Diet effective now                 EDUCATION NEEDS:   No education  needs have been identified at this time  Skin:  Skin Assessment: Reviewed RN Assessment  Last BM:  3/3 medium; type 6  Height:   Ht Readings from Last 1 Encounters:  11/24/20 5\' 4"  (1.626 m)    Weight:   Wt Readings from Last 1 Encounters:  11/30/20 71.1 kg    Ideal Body Weight:  54.4 kg  BMI:  Body mass index is 26.91 kg/m.  Estimated Nutritional Needs:   Kcal:  1800-2000  Protein:  90-110 grams  Fluid:  > 1.8 L/day  Lockie Pares., RD, LDN, CNSC See AMiON for contact information

## 2020-12-01 DIAGNOSIS — R739 Hyperglycemia, unspecified: Secondary | ICD-10-CM | POA: Diagnosis not present

## 2020-12-01 DIAGNOSIS — I639 Cerebral infarction, unspecified: Secondary | ICD-10-CM | POA: Diagnosis not present

## 2020-12-01 LAB — CBC WITH DIFFERENTIAL/PLATELET
Abs Immature Granulocytes: 0.37 10*3/uL — ABNORMAL HIGH (ref 0.00–0.07)
Basophils Absolute: 0 10*3/uL (ref 0.0–0.1)
Basophils Relative: 0 %
Eosinophils Absolute: 0 10*3/uL (ref 0.0–0.5)
Eosinophils Relative: 0 %
HCT: 37.6 % (ref 36.0–46.0)
Hemoglobin: 12.3 g/dL (ref 12.0–15.0)
Immature Granulocytes: 2 %
Lymphocytes Relative: 8 %
Lymphs Abs: 1.4 10*3/uL (ref 0.7–4.0)
MCH: 25.3 pg — ABNORMAL LOW (ref 26.0–34.0)
MCHC: 32.7 g/dL (ref 30.0–36.0)
MCV: 77.4 fL — ABNORMAL LOW (ref 80.0–100.0)
Monocytes Absolute: 1 10*3/uL (ref 0.1–1.0)
Monocytes Relative: 6 %
Neutro Abs: 13.7 10*3/uL — ABNORMAL HIGH (ref 1.7–7.7)
Neutrophils Relative %: 84 %
Platelets: 319 10*3/uL (ref 150–400)
RBC: 4.86 MIL/uL (ref 3.87–5.11)
RDW: 16.8 % — ABNORMAL HIGH (ref 11.5–15.5)
WBC: 16.5 10*3/uL — ABNORMAL HIGH (ref 4.0–10.5)
nRBC: 0 % (ref 0.0–0.2)

## 2020-12-01 LAB — GLUCOSE, CAPILLARY
Glucose-Capillary: 139 mg/dL — ABNORMAL HIGH (ref 70–99)
Glucose-Capillary: 182 mg/dL — ABNORMAL HIGH (ref 70–99)
Glucose-Capillary: 186 mg/dL — ABNORMAL HIGH (ref 70–99)
Glucose-Capillary: 78 mg/dL (ref 70–99)
Glucose-Capillary: 92 mg/dL (ref 70–99)

## 2020-12-01 LAB — TRIGLYCERIDES: Triglycerides: 212 mg/dL — ABNORMAL HIGH (ref <150)

## 2020-12-01 LAB — BASIC METABOLIC PANEL
Anion gap: 14 (ref 5–15)
BUN: 19 mg/dL (ref 8–23)
CO2: 21 mmol/L — ABNORMAL LOW (ref 22–32)
Calcium: 8.8 mg/dL — ABNORMAL LOW (ref 8.9–10.3)
Chloride: 102 mmol/L (ref 98–111)
Creatinine, Ser: 0.72 mg/dL (ref 0.44–1.00)
GFR, Estimated: >60 mL/min (ref >60)
Glucose, Bld: 129 mg/dL — ABNORMAL HIGH (ref 70–99)
Potassium: 3.4 mmol/L — ABNORMAL LOW (ref 3.5–5.1)
Sodium: 137 mmol/L (ref 135–145)

## 2020-12-01 LAB — HEPARIN LEVEL (UNFRACTIONATED)
Heparin Unfractionated: 0.27 IU/mL — ABNORMAL LOW (ref 0.30–0.70)
Heparin Unfractionated: 0.49 IU/mL (ref 0.30–0.70)

## 2020-12-01 MED ORDER — POTASSIUM CHLORIDE 10 MEQ/100ML IV SOLN
10.0000 meq | INTRAVENOUS | Status: AC
Start: 2020-12-01 — End: 2020-12-01
  Administered 2020-12-01 (×4): 10 meq via INTRAVENOUS
  Filled 2020-12-01 (×4): qty 100

## 2020-12-01 MED ORDER — HYDRALAZINE HCL 20 MG/ML IJ SOLN: 5.0000 mg | INTRAMUSCULAR | Status: DC | PRN

## 2020-12-01 MED ORDER — INSULIN ASPART 100 UNIT/ML ~~LOC~~ SOLN
0.0000 [IU] | Freq: Three times a day (TID) | SUBCUTANEOUS | Status: DC
  Administered 2020-12-01: 2 [IU] via SUBCUTANEOUS
  Administered 2020-12-01: 3 [IU] via SUBCUTANEOUS
  Administered 2020-12-02: 0 [IU] via SUBCUTANEOUS
  Administered 2020-12-02: 2 [IU] via SUBCUTANEOUS
  Administered 2020-12-02: 3 [IU] via SUBCUTANEOUS
  Administered 2020-12-03: 2 [IU] via SUBCUTANEOUS
  Administered 2020-12-03: 3 [IU] via SUBCUTANEOUS
  Administered 2020-12-04: 5 [IU] via SUBCUTANEOUS
  Administered 2020-12-04 – 2020-12-07 (×8): 3 [IU] via SUBCUTANEOUS
  Administered 2020-12-07: 5 [IU] via SUBCUTANEOUS
  Administered 2020-12-07 – 2020-12-08 (×2): 3 [IU] via SUBCUTANEOUS
  Administered 2020-12-08: 8 [IU] via SUBCUTANEOUS
  Administered 2020-12-08: 3 [IU] via SUBCUTANEOUS
  Administered 2020-12-09: 5 [IU] via SUBCUTANEOUS
  Administered 2020-12-09 – 2020-12-10 (×2): 3 [IU] via SUBCUTANEOUS

## 2020-12-01 MED ORDER — ROSUVASTATIN CALCIUM 5 MG PO TABS
10.0000 mg | ORAL_TABLET | Freq: Every day | ORAL | Status: DC
  Administered 2020-12-02 – 2020-12-10 (×9): 10 mg via ORAL
  Filled 2020-12-01 (×9): qty 2

## 2020-12-01 MED ORDER — ASPIRIN 325 MG PO TABS
325.0000 mg | ORAL_TABLET | Freq: Every day | ORAL | Status: DC
  Administered 2020-12-01 – 2020-12-02 (×2): 325 mg via ORAL
  Filled 2020-12-01 (×2): qty 1

## 2020-12-01 MED ORDER — INSULIN DETEMIR 100 UNIT/ML ~~LOC~~ SOLN
20.0000 [IU] | Freq: Every day | SUBCUTANEOUS | Status: DC
  Filled 2020-12-01: qty 0.2

## 2020-12-01 MED ORDER — POTASSIUM CHLORIDE CRYS ER 20 MEQ PO TBCR
20.0000 meq | EXTENDED_RELEASE_TABLET | Freq: Every day | ORAL | Status: DC
  Administered 2020-12-01 – 2020-12-04 (×4): 20 meq via ORAL
  Filled 2020-12-01 (×4): qty 1

## 2020-12-01 NOTE — Progress Notes (Signed)
Report given to RN on 5W. Pt's family is aware of her transfer to [phone]. [name], Rande Brunt, RN

## 2020-12-01 NOTE — Progress Notes (Signed)
  Speech Language Pathology Treatment: Dysphagia  Patient Details Name: Emily Lozano MRN: [id number] DOB: 1950/10/28 Today's Date: 12/01/2020 Time: 0947-0962 SLP Time Calculation (min) (ACUTE ONLY): 15 min  Assessment / Plan / Recommendation Clinical Impression  Pt was seen for skilled ST targeting diet tolerance following FEES yesterday.  Pt was noted to have mild inspiratory stridor upon SLP arrival.  She was seen with trials of honey-thick liquid via assisted cup sip and with puree via tsp.  Pt exhibited good labial closure around the spoon and AP transport of all boluses appeared timely.  Labial closure was reduced around the cup, resulting in minimal R anterior labial spillage of honey-thick liquid.  Pt appeared to have increased WOB during PO trials; however, RR, HR, and SpO2 remained stable with RR ranging in the low 20s and SpO2 remaining between 97-100.  No additional overt s/sx of aspiration were observed with any trials.  RN reported that pt tolerated medications administered crushed in puree without difficulty this morning.  Recommend continuation of Dysphagia 1 (puree) solids and honey-thick liquids with medications administered crushed in puree.  SLP will f/u per POC.    HPI HPI: [name] is a 70 y.o. female Hx of multiple cerebrovascular risk factors presenting with sudden onset of left gaze preference, right facial droop, hemiparesis and expressive aphasia still with left hemispheric dysfunction. She is also Covid positive with peroneal vein thrombosis on lower extremity Dopplers and she is on IV heparin. Intubated from 2/26-3/2 also with stridor following extubation. MRI shows Acute infarction in the left MCA territory affecting the deep insula and left frontoparietal cortical and subcortical brain.      SLP Plan  Continue with current plan of care       Recommendations  Diet recommendations: Honey-thick liquid;Dysphagia 1 (puree) Liquids provided via:  Cup;Teaspoon Medication Administration: Crushed with puree Supervision: Staff to assist with self feeding;Full supervision/cueing for compensatory strategies Compensations: Slow rate;Small sips/bites Postural Changes and/or Swallow Maneuvers: Upright 30-60 min after meal;Seated upright 90 degrees                Oral Care Recommendations: Oral care BID;Staff/trained caregiver to provide oral care Follow up Recommendations: Inpatient Rehab SLP Visit Diagnosis: Dysphagia, oropharyngeal phase (R13.12) Plan: Continue with current plan of care       GO               Colin Mulders M.S., [place] [facility] Office: [phone]  [place] 12/01/2020, 11:38 AM

## 2020-12-01 NOTE — Progress Notes (Signed)
NAME:  Emily Lozano, MRN:  [id number], DOB:  [birth year], LOS: 4 ADMISSION DATE:  11/24/2020, CONSULTATION DATE:  11/24/2020 REFERRING MD:  Rory Percy - neuro, CHIEF COMPLAINT:  L M2 CVA, Covid infection, respiratory failure  Brief History:  70 yo admitted 2/26 with L M2 occlusion, outside of tpa window. Went to [facility] for thrombectomy, unsuccessful, c/b bleed.Resulted + for Covid in NIR, will remain intubated. Reportedly has had some mild URI sx x 1-2 wks (had tested negative OP in this time frame). hypertension, hyperlipidemia, type 2 diabetes mellitus, depression, GERD, melanoma, sleep apnea, and recent exposure to COVID-19  History of Present Illness:  70 yo F PMH HTN HLD Dm2 who presented as a code stroke 2/26 with acute R hemiparesis, L gaze preference, aphasia. KWN 10:30 AM -- outside of window for tpa. CTA head reveals L M2 occlusion, CTA cp with L MCA ischemia. Taken to [facility] for thrombectomy -- c/b bleed, thrombectomy unsuccessful.In NIR, COVID-19 resulted positive. Reportedly, pts family had +Covid recently, pt had URI sx and was tested for COVID 2/25 - and was negative, started on ZPack at that time.  Repeat neuroimaging is pending during [place] will be to remain intubated following case and transfer to ICU. PCCM consulted in this setting   Past Medical History:  HTN HLD DM2   Significant Hospital Events:  11/24/2020 - admit => Left distal M2/MCA middle division branch occlusion vi RCFA. :There was a left M2/MCA middle division branch occlusion with likely atherosclerotic plaque underlying. Two direct contact aspiration passes performed with recanalization of the proximal aspect of the M2 branch and persistent distal occlusion. Two passes were then performed with stent retriever with persistent occlusion. Flat panel head CT showed contrast extravasation in the left sylvian fissure. Delayed angiograms and delayed flat panel CT showed no evidence of active extravasation.  Consults:   11/24/2020  - CCM  Procedures:  2/26 - see events 3/2 - Extubated   Significant Diagnostic Tests:  2.26 - see events  Micro Data:  11/24/2020 - covid +  Antimicrobials:  2/26 - remdesivir  Interim History / Subjective:  Denies chest pain, shortness of breath.  Still aphasic  Objective   Blood pressure (!) 149/69, pulse 72, temperature 98.4 F (36.9 C), temperature source Oral, resp. rate 17, height 5\' 4"  (1.626 m), weight 69.8 kg, SpO2 99 %.    Vent Mode: PCV;BIPAP FiO2 (%):  [21 %-30 %] 21 % Set Rate:  [12 bmp] 12 bmp PEEP:  [6 cmH20] 6 cmH20   Intake/Output Summary (Last 24 hours) at 12/01/2020 0700 Last data filed at 12/01/2020 0400 Gross per 24 hour  Intake 1784.14 ml  Output -  Net 1784.14 ml   Filed Weights   11/26/20 0253 11/30/20 0406 12/01/20 0500  Weight: 71.2 kg 71.1 kg 69.8 kg    Examination: Const: Lying comfortably in bed HEENT:    Resp: CTA BL, no wheezes, crackles, rhonchi CV: RRR, no MGR Abd: Bowel sounds present, nondistended, nontender to palpation Ext: No lower extremity edema, skin is warm to touch Neuro: Strength 0/5 RUE and RLE. 3-4/5 LUE, LLE. Remains aphasic   Resolved Hospital Problem list   Acute respiratory failure  Assessment & Plan:  #Acute ischemic stroke, Left M2 MCA w/ R. Hemiplegia Remains aphasic.  -Appreciate neurology recommendations -If there is no improvement, will likely place a core track -Aspirin 325 mg daily -Secondary stroke prevention and rehabilitation   #Hypertension  -Continue lisinopril 5 mg daily today -Hydrochlorothiazide 12.5  mg daily -Metoprolol 50 mg twice daily -Hydralazine prn   #Atrial Fibrillation with RVR (CHA2DS2-VASc) score 5-6 Remains in SR -Continue metoprolol 50 mg twice daily -Patient already on heparin for acute LE DVT -Will have to transition to Verdigris as no evidence of valvular abnormality    #Acute respiratory failure post op due to stroke in setting of covid  Resolved, extubated  on 3/2  -Wean O2 as needed   #COVID 19 positive, sympomatic prior to admission -Finished course of remdesivir -Continue Decadron. Transition to [place] when appropriate    #Acute lower extremity DVT -Continue heparin infusion   #Diabetes mellitus, complicated initially by hyperglycemia. At risk for hypoglycemia  Goal BG 140-180. BG this am 78 -Switch SSI q4hr from resistant to moderate  -Levemir 20 units twice daily   #?Laryngeal edema She was found to have possible right vocal cord edema that was noticed during SLP evaluation.  She is already on Decadron for her Covid infection.  Continue Decadron and assess her swallow function.  If there is no improvement, will get ENTs input.    #History of Rheumatoid arthritis #Depression/Anxiety    Best practice (evaluated daily)  Diet: Aphasic, remains n.p.o.  NG tube today Pain/Anxiety/Delirium protocol (if indicated): Off all sedation VAP protocol (if indicated): yes DVT prophylaxis: scd + IV heparin due to recent DVT GI prophylaxis: ppi Glucose control: poorly controlled due to steroid induced will increase insulin detemir Mobility: PT OT. Disposition: ICU  Family update: Family at bedside  Goals of Care:  Per Primary Team  Labs   CBC: Recent Labs  Lab 11/26/20 1051 11/27/20 0701 11/28/20 0215 11/29/20 0556 11/30/20 0311 12/01/20 0453  WBC 13.8* 11.4* 11.0* 11.3* 8.7 16.5*  NEUTROABS 11.0*  --  8.8* 8.4* 6.4 13.7*  HGB 11.3* 10.9* 10.8* 11.2* 11.2* 12.3  HCT 35.4* 34.4* 33.5* 36.6 35.1* 37.6  MCV 80.1 77.1* 76.8* 79.4* 77.7* 77.4*  PLT 576* 474* 481* 515* 446* 448    Basic Metabolic Panel: Recent Labs  Lab 11/25/20 0608 11/26/20 0413 11/27/20 0701 11/27/20 1544 11/28/20 0215 11/29/20 0556 11/30/20 0311 12/01/20 0453  NA 143 139  --  142 144 142 136 137  K 2.8* 3.9  --  3.9 3.3* 3.8 3.6 3.4*  CL 107 109  --  108 110 108 104 102  CO2 23 15*  --  22 23 23 24  21*  GLUCOSE 202* 301*  --  366* 283* 116* 135*  129*  BUN 9 10  --  16 18 14 20 19   CREATININE 0.89 0.72  --  0.77 0.68 0.60 0.61 0.72  CALCIUM 8.1* 8.1*  --  8.2* 8.5* 8.6* 8.2* 8.8*  MG 1.9 2.3  --   --  2.1  --   --   --   PHOS 2.7 2.7 1.7*  --  2.5  --   --   --    GFR: Estimated Creatinine Clearance: 63.6 mL/min (by C-G formula based on SCr of 0.72 mg/dL). Recent Labs  Lab 11/24/20 2058 11/25/20 0544 11/25/20 0608 11/26/20 1051 11/28/20 0215 11/29/20 0556 11/30/20 0311 12/01/20 0453  PROCALCITON <0.10  --  <0.10  --   --   --   --   --   WBC 5.3  --   --    < > 11.0* 11.3* 8.7 16.5*  LATICACIDVEN 2.4* 2.5*  --   --   --   --   --   --    < > = values  in this interval not displayed.    Liver Function Tests: Recent Labs  Lab 11/24/20 1602 11/25/20 0608  AST 18 17  ALT 13 14  ALKPHOS 55 55  BILITOT 0.7 <0.1*  PROT 6.9 6.3*  ALBUMIN 2.9* 2.5*   No results for input(s): LIPASE, AMYLASE in the last 168 hours. No results for input(s): AMMONIA in the last 168 hours.  ABG    Component Value Date/Time   PHART 7.418 11/24/2020 2036   PCO2ART 37.4 11/24/2020 2036   PO2ART 97 11/24/2020 2036   HCO3 24.1 11/24/2020 2036   TCO2 25 11/24/2020 2036   O2SAT 98.0 11/24/2020 2036     Coagulation Profile: Recent Labs  Lab 11/24/20 1602  INR SPECIMEN CLOTTED    Cardiac Enzymes: No results for input(s): CKTOTAL, CKMB, CKMBINDEX, TROPONINI in the last 168 hours.  HbA1C: Hgb A1c MFr Bld  Date/Time Value Ref Range Status  11/25/2020 06:08 AM 10.6 (H) 4.8 - 5.6 % Final    Comment:    (NOTE) Pre diabetes:          5.7%-6.4%  Diabetes:              >6.4%  Glycemic control for   <7.0% adults with diabetes   11/24/2020 08:58 PM 10.4 (H) 4.8 - 5.6 % Final    Comment:    (NOTE) Pre diabetes:          5.7%-6.4%  Diabetes:              >6.4%  Glycemic control for   <7.0% adults with diabetes     CBG: Recent Labs  Lab 11/30/20 1157 11/30/20 1817 11/30/20 2101 12/01/20 0039 12/01/20 0535  GLUCAP 101*  236* 210* 182* 92    Review of Systems:   As per HPI  Past Medical History:  She,  has a past medical history of Anemia, Arthritis, Collagen vascular disease ([place]), Depression, Diabetes mellitus without complication ([place]), Hypertension, and Melanoma ([place]) (2014).   Surgical History:   Past Surgical History:  Procedure Laterality Date  . ABDOMINAL HYSTERECTOMY    . BREAST LUMPECTOMY    . CARPAL TUNNEL RELEASE    . ESOPHAGEAL MANOMETRY N/A 12/05/2015   Procedure: ESOPHAGEAL MANOMETRY (EM);  Surgeon: Josefine Class, MD;  Location: [facility] ENDOSCOPY;  Service: Endoscopy;  Laterality: N/A;  . ESOPHAGOGASTRODUODENOSCOPY (EGD) WITH PROPOFOL N/A 11/26/2015   Procedure: ESOPHAGOGASTRODUODENOSCOPY (EGD) WITH PROPOFOL;  Surgeon: Lollie Sails, MD;  Location: [facility] ENDOSCOPY;  Service: Endoscopy;  Laterality: N/A;  . ESOPHAGOGASTRODUODENOSCOPY (EGD) WITH PROPOFOL N/A 04/30/2017   Procedure: ESOPHAGOGASTRODUODENOSCOPY (EGD) WITH PROPOFOL;  Surgeon: Lollie Sails, MD;  [facility];  Service: Endoscopy;  Laterality: N/A;  . EXCISION MELANOMA WITH SENTINEL LYMPH NODE BIOPSY    . IR CT HEAD LTD  11/24/2020  . IR CT HEAD LTD  11/24/2020  . IR PERCUTANEOUS ART THROMBECTOMY/INFUSION INTRACRANIAL INC DIAG ANGIO  11/24/2020  . IR US GUIDE VASC ACCESS RIGHT  11/24/2020  . RADIOLOGY WITH ANESTHESIA N/A 11/24/2020   Procedure: IR WITH ANESTHESIA;  Surgeon: Radiologist, Medication, MD;  Location: [place];  Service: Radiology;  Laterality: N/A;     Social History:   reports that she has quit smoking. She has never used smokeless tobacco. She reports that she does not drink alcohol and does not use drugs.   Family History:  Her family history is not on file.   Allergies Allergies  Allergen Reactions  . Morphine And Related Nausea And Vomiting  Home Medications  Prior to Admission medications   Medication Sig Start Date End Date Taking? Authorizing Provider  amLODipine (NORVASC) 5 MG  tablet Take 5 mg by mouth daily. 10/23/20  Yes [provider]  aspirin EC 81 MG tablet Take 81 mg by mouth daily.   Yes [provider]  buPROPion (WELLBUTRIN XL) 300 MG 24 hr tablet Take 300 mg by mouth daily.   Yes [provider]  Dextromethorphan-guaiFENesin (ROBITUSSIN COUGH+CHEST CONG DM) 20-200 MG/20ML LIQD Take 20 mLs by mouth 2 (two) times daily as needed (cough/congestion).   Yes [provider]  dicyclomine (BENTYL) 10 MG capsule Take 10 mg by mouth 3 (three) times daily as needed (diarrhea).   Yes [provider]  DM-APAP-CPM (CORICIDIN HBP PO) Take 1 tablet by mouth 2 (two) times daily as needed (cough congestion).   Yes [provider]  glimepiride (AMARYL) 4 MG tablet Take 4 mg by mouth See admin instructions. Take one tablet (4 mg) by mouth every morning, take an additional tablet (4 mg) for CBG 250-300 10/23/20  Yes [provider]  hydroxychloroquine (PLAQUENIL) 200 MG tablet Take 200-400 mg by mouth See admin instructions. Take 2 tablets (400 mg) by mouth 1st day, then take 1 tablet (200 mg) 2nd day, then repeat alternating days   Yes [provider]  losartan-hydrochlorothiazide (HYZAAR) 100-12.5 MG tablet Take 1 tablet by mouth daily.   Yes [provider]  metFORMIN (GLUCOPHAGE) 500 MG tablet Take 500 mg by mouth 2 (two) times daily with a meal.   Yes [provider]  metoprolol succinate (TOPROL-XL) 100 MG 24 hr tablet Take 100 mg by mouth daily. Take with or immediately following a meal.   Yes [provider]  naproxen sodium (ALEVE) 220 MG tablet Take 220 mg by mouth 2 (two) times daily as needed (pain/headache).   Yes [provider]  pantoprazole (PROTONIX) 40 MG tablet Take 40 mg by mouth 2 (two) times daily.   Yes [provider]  rosuvastatin (CRESTOR) 10 MG tablet Take 10 mg by mouth daily.   Yes [provider]  Ascorbic Acid (VITAMIN C) 1000 MG  tablet Take 1,000 mg by mouth daily.    [provider]  azithromycin (ZITHROMAX) 250 MG tablet Take 250-500 mg by mouth See admin instructions. Take 2 tablets (500 mg) by mouth 1st day, then take 1 tablet (250 mg) daily on days 2-5 11/23/20   [provider]  benzonatate (TESSALON) 200 MG capsule Take 200 mg by mouth 3 (three) times daily as needed for cough. 11/23/20   [provider]  predniSONE (DELTASONE) 20 MG tablet Take 20 mg by mouth daily. 11/23/20   [provider]  Zinc 50 MG TABS Take 50 mg by mouth daily.    [provider]     Critical care time:

## 2020-12-01 NOTE — Progress Notes (Signed)
ANTICOAGULATION CONSULT NOTE - Follow Up Consult  Pharmacy Consult for Heparin Indication: RLE DVT  Allergies  Allergen Reactions  . Morphine And Related Nausea And Vomiting    Patient Measurements: Height: 5\' 4"  (162.6 cm) Weight: 69.8 kg (153 lb 14.1 oz) IBW/kg (Calculated) : 54.7 Heparin Dosing Weight:   Vital Signs: Temp: 98.4 F (36.9 C) (03/05 0000) Temp Source: Oral (03/05 0000) BP: 149/69 (03/05 0400) Pulse Rate: 72 (03/05 0400)  Labs: Recent Labs    11/29/20 0556 11/29/20 2011 11/30/20 0311 11/30/20 1818 12/01/20 0453  HGB 11.2*  --  11.2*  --  12.3  HCT 36.6  --  35.1*  --  37.6  PLT 515*  --  446*  --  319  HEPARINUNFRC 0.59   < > 0.51 0.43 0.27*  CREATININE 0.60  --  0.61  --   --    < > = values in this interval not displayed.    Estimated Creatinine Clearance: 63.6 mL/min (by C-G formula based on SCr of 0.61 mg/dL).   Assessment: Anticoag: Heparin for RLE DVT, CVA   Heparin level down to slightly subtherapeutic (0.27) on gtt at 1300 units/hr. No issues with line or bleeding reported per RN.  Goal of Therapy:  Heparin level 0.3-0.5 units/ml Monitor platelets by anticoagulation protocol: Yes   Plan:  Increase heparin gtt to 1400 units/hr F/u 6 hr heparin level  Sherlon Handing, PharmD, BCPS Please see amion for complete clinical pharmacist phone list 12/01/2020,5:41 AM

## 2020-12-01 NOTE — Progress Notes (Signed)
STROKE TEAM PROGRESS NOTE   INTERVAL HISTORY Son at bedside. Pt more awake and interactive although still lethargic. No respiratory distress, on room air. Still has global aphasia, left gaze preference and right hemiplegia. On heparin IV. Passed swallow, put on puree diet and honey thick liquid.   Vitals:   12/01/20 0900 12/01/20 1000 12/01/20 1100 12/01/20 1200  BP: (!) 150/63 138/66 (!) 140/58 138/74  Pulse: 73 74 73 71  Resp: (!) 23 17 (!) 24 18  Temp:    98.7 F (37.1 C)  TempSrc:    Oral  SpO2: 95% 94% 93% 93%  Weight:      Height:       CBC:  Recent Labs  Lab 11/30/20 0311 12/01/20 0453  WBC 8.7 16.5*  NEUTROABS 6.4 13.7*  HGB 11.2* 12.3  HCT 35.1* 37.6  MCV 77.7* 77.4*  PLT 446* 193   Basic Metabolic Panel:  Recent Labs  Lab 11/26/20 0413 11/27/20 0701 11/27/20 1544 11/28/20 0215 11/29/20 0556 11/30/20 0311 12/01/20 0453  NA 139  --    < > 144   < > 136 137  K 3.9  --    < > 3.3*   < > 3.6 3.4*  CL 109  --    < > 110   < > 104 102  CO2 15*  --    < > 23   < > 24 21*  GLUCOSE 301*  --    < > 283*   < > 135* 129*  BUN 10  --    < > 18   < > 20 19  CREATININE 0.72  --    < > 0.68   < > 0.61 0.72  CALCIUM 8.1*  --    < > 8.5*   < > 8.2* 8.8*  MG 2.3  --   --  2.1  --   --   --   PHOS 2.7 1.7*  --  2.5  --   --   --    < > = values in this interval not displayed.   Lipid Panel:  Recent Labs  Lab 11/25/20 0608 11/26/20 0413 12/01/20 0453  CHOL 139  --   --   TRIG 418*   < > 212*  HDL 15*  --   --   CHOLHDL 9.3  --   --   VLDL UNABLE TO CALCULATE IF TRIGLYCERIDE OVER 400 mg/dL  --   --   LDLCALC UNABLE TO CALCULATE IF TRIGLYCERIDE OVER 400 mg/dL  --   --    < > = values in this interval not displayed.   HgbA1c:  Recent Labs  Lab 11/25/20 0608  HGBA1C 10.6*   IMAGING past 24 hours No results found.  PHYSICAL EXAM  Temp:  [98 F (36.7 C)-98.7 F (37.1 C)] 98.7 F (37.1 C) (03/05 1200) Pulse Rate:  [60-90] 71 (03/05 1200) Resp:  [17-30] 18  (03/05 1200) BP: (124-164)/(47-79) 138/74 (03/05 1200) SpO2:  [91 %-100 %] 93 % (03/05 1200) Weight:  [69.8 kg] 69.8 kg (03/05 0500)  General - Well nourished, well developed, lethargic.  Ophthalmologic - fundi not visualized due to noncooperation.  Cardiovascular - Regular rhythm and rate, not in afib.  Neuro - lethargic, eyes close but able to open with voice, global aphasia, not able to follow simple commands, able to pantomime limited commands. Able to make sounds but not meaningful, she did attempt to repeat 3-word sentences but largely intangible. Not able  to name. Left gaze preference but able to have right gaze, blinking to visual threat on the left but not on the right, PERRL. Right significant facial droop. Tongue protrusion not cooperative. Left UE no drift and left LE 2+/5 with stimulation. However, right UE and LE flaccid. Sensation, coordination not cooperative and gait not tested.   ASSESSMENT/PLAN Ms. ALIXANDRA ALFIERI is a 70 y.o. female Hx of multiple cerebrovascular risk factors presenting with sudden onset of left gaze preference, right facial droop, hemiparesis and expressive aphasia still with left hemispheric dysfunction. CTA head and neck with left M2 occlusion and though outside the window for IV TPA was a candidate for intervention now s/p unsuccessful revascularization, though there was some distal movement of the clot from M2 to M3; TIKI3. Now intubated and mildly sedated but awake not moving right side or following commands.  She is also Covid positive with peroneal vein thrombosis on lower extremity Dopplers and she is on IV heparin.  Continue close neurological monitoring and heparin level.     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 However, atherosclerosis is also likely part of the etiology  CT head Acute infarct involving the left insular  cortex.  CTA head and neck Left M2 segment occlusion.   CT perfusion no core and 30cc ischemia.  IR unsuccessful IR with left M2 atherosclerosis  MRI  Acute infarction in the left MCA territory affecting the deep insula and left frontoparietal cortical and subcortical brain. Minimal petechial blood products or susceptibility artifact related to thrombosed vessels.   CT repeat in am  2D Echo 55 to 60%  LDL 70.6  HgbA1c 10.6  VTE prophylaxis - heparin IV  aspirin 81 mg daily prior to admission, now on heparin IV and ASA 325. Will switch to eliquis if CT no frank hematoma  Therapy recommendations: CIR  Disposition:  TBD  Atrial fibrillation RVR, new diagnosis  Amiodarone infusion  Rate control with Metoprolol 50 bid  On heparin IV   Will switch to eliquis if CT no frank hematoma  Acute DVT  LE venous doppler showed right peroneal vein DVT  Likely related to COVID hypercoagulable state  On heparin IV  Will switch to eliquis if CT no frank hematoma  COVID pneumonia  CCM on board  Extubated   Off BIPAP, on room air  Still on decadron, completed remdesivir  Hypertension  Home meds: norvasc, hyzaar, metoprolol 100 daily  Off cleviprex . SBP goal <160 but gradually down to goal in 5-7 days . Metoprolol 50mg  bid, hydrochlorothiazide 12.5mg  daily, lisinopril 5 . Hydralazine prn . Long-term BP goal 130-150 given left M2 occlusion  Hyperlipidemia  Home meds:  Rosuvastatin 10mg , resumed in hospital  LDL goal < 70  LDL 70.6  Triglycerides 294  Continue statin at discharge  Diabetes type II uncontrolled  Home meds:  glimepiride (AMARYL) metFORMIN (GLUCOPHAGE)   HgbA1c 10.6, goal < 7.0  Levemir 20units bid  CBGs  SSI  Close PCP follow up  Other Stroke Risk Factors  Advanced Age >/= 67   former Cigarette smoker  Other Active Problems  Hypokalemia, K 3.4->3.8  Leukocytosis WBC 4.7->16.5   This patient is critically ill due to left  MCA stroke, COVID infection, afib RVR, acute DVT, uncontrolled DM and at significant risk of neurological worsening, death form recurrent stroke, hemorrhagic conversion, COVID pneumonia, heart failure, PE, DKA. This patient's care requires  constant monitoring of vital signs, hemodynamics, respiratory and cardiac monitoring, review of multiple databases, neurological assessment, discussion with family, other specialists and medical decision making of high complexity. I spent 35 minutes of neurocritical care time in the care of this patient. I discussed with CCM resident who discussed with Dr. Carlis Abbott, [place].   Rosalin Hawking, MD PhD Stroke Neurology 12/01/2020 10:43 PM     To contact Stroke Continuity provider, please refer to [email or web address] After hours, contact General Neurology

## 2020-12-01 NOTE — Progress Notes (Signed)
ANTICOAGULATION CONSULT NOTE - Follow Up Consult  Pharmacy Consult for Heparin Indication: RLE DVT  Allergies  Allergen Reactions  . Morphine And Related Nausea And Vomiting    Patient Measurements: Height: 5\' 4"  (162.6 cm) Weight: 69.8 kg (153 lb 14.1 oz) IBW/kg (Calculated) : 54.7 Heparin Dosing Weight: 68.8 kg  Vital Signs: Temp: 98.7 F (37.1 C) (03/05 1200) Temp Source: Oral (03/05 1200) BP: 165/74 (03/05 1400) Pulse Rate: 72 (03/05 1400)  Labs: Recent Labs    11/29/20 0556 11/29/20 2011 11/30/20 0311 11/30/20 1818 12/01/20 0453 12/01/20 1216  HGB 11.2*  --  11.2*  --  12.3  --   HCT 36.6  --  35.1*  --  37.6  --   PLT 515*  --  446*  --  319  --   HEPARINUNFRC 0.59   < > 0.51 0.43 0.27* 0.49  CREATININE 0.60  --  0.61  --  0.72  --    < > = values in this interval not displayed.    Estimated Creatinine Clearance: 63.6 mL/min (by C-G formula based on SCr of 0.72 mg/dL).   Assessment: 89 yof continuing on heparin for RLE DVT, CVA.  Heparin level therapeutic (0.49) this afternoon. CBC wnl. No bleeding issues reported.  Goal of Therapy:  Heparin level 0.3-0.5 units/ml Monitor platelets by anticoagulation protocol: Yes   Plan:  Continue heparin drip at 1400 units/hr Monitor daily heparin level and CBC, s/sx bleeding   Arturo Morton, PharmD, BCPS Please check AMION for all [place] contact numbers Clinical Pharmacist 12/01/2020 2:55 PM

## 2020-12-02 ENCOUNTER — Inpatient Hospital Stay (HOSPITAL_COMMUNITY): Payer: Medicare Other

## 2020-12-02 DIAGNOSIS — I10 Essential (primary) hypertension: Secondary | ICD-10-CM | POA: Diagnosis not present

## 2020-12-02 DIAGNOSIS — I639 Cerebral infarction, unspecified: Secondary | ICD-10-CM | POA: Diagnosis not present

## 2020-12-02 LAB — CBC WITH DIFFERENTIAL/PLATELET
Abs Immature Granulocytes: 0.38 10*3/uL — ABNORMAL HIGH (ref 0.00–0.07)
Basophils Absolute: 0 10*3/uL (ref 0.0–0.1)
Basophils Relative: 0 %
Eosinophils Absolute: 0.1 10*3/uL (ref 0.0–0.5)
Eosinophils Relative: 0 %
HCT: 32.5 % — ABNORMAL LOW (ref 36.0–46.0)
Hemoglobin: 10.8 g/dL — ABNORMAL LOW (ref 12.0–15.0)
Immature Granulocytes: 2 %
Lymphocytes Relative: 9 %
Lymphs Abs: 1.6 10*3/uL (ref 0.7–4.0)
MCH: 25.5 pg — ABNORMAL LOW (ref 26.0–34.0)
MCHC: 33.2 g/dL (ref 30.0–36.0)
MCV: 76.8 fL — ABNORMAL LOW (ref 80.0–100.0)
Monocytes Absolute: 0.7 10*3/uL (ref 0.1–1.0)
Monocytes Relative: 4 %
Neutro Abs: 14.5 10*3/uL — ABNORMAL HIGH (ref 1.7–7.7)
Neutrophils Relative %: 85 %
Platelets: 412 10*3/uL — ABNORMAL HIGH (ref 150–400)
RBC: 4.23 MIL/uL (ref 3.87–5.11)
RDW: 17.1 % — ABNORMAL HIGH (ref 11.5–15.5)
WBC: 17.2 10*3/uL — ABNORMAL HIGH (ref 4.0–10.5)
nRBC: 0 % (ref 0.0–0.2)

## 2020-12-02 LAB — BASIC METABOLIC PANEL
Anion gap: 12 (ref 5–15)
BUN: 13 mg/dL (ref 8–23)
CO2: 20 mmol/L — ABNORMAL LOW (ref 22–32)
Calcium: 8.5 mg/dL — ABNORMAL LOW (ref 8.9–10.3)
Chloride: 104 mmol/L (ref 98–111)
Creatinine, Ser: 0.69 mg/dL (ref 0.44–1.00)
GFR, Estimated: >60 mL/min (ref >60)
Glucose, Bld: 138 mg/dL — ABNORMAL HIGH (ref 70–99)
Potassium: 3.7 mmol/L (ref 3.5–5.1)
Sodium: 136 mmol/L (ref 135–145)

## 2020-12-02 LAB — HEPARIN LEVEL (UNFRACTIONATED): Heparin Unfractionated: 0.42 IU/mL (ref 0.30–0.70)

## 2020-12-02 LAB — GLUCOSE, CAPILLARY
Glucose-Capillary: 98 mg/dL (ref 70–99)
Glucose-Capillary: 170 mg/dL — ABNORMAL HIGH (ref 70–99)
Glucose-Capillary: 122 mg/dL — ABNORMAL HIGH (ref 70–99)
Glucose-Capillary: 141 mg/dL — ABNORMAL HIGH (ref 70–99)

## 2020-12-02 MED ORDER — APIXABAN 5 MG PO TABS
5.0000 mg | ORAL_TABLET | Freq: Two times a day (BID) | ORAL | Status: DC
  Administered 2020-12-09 – 2020-12-10 (×2): 5 mg via ORAL
  Filled 2020-12-02 (×2): qty 1

## 2020-12-02 MED ORDER — DEXAMETHASONE 2 MG PO TABS
2.0000 mg | ORAL_TABLET | Freq: Every day | ORAL | Status: AC
  Administered 2020-12-02 – 2020-12-04 (×3): 2 mg via ORAL
  Filled 2020-12-02 (×3): qty 1

## 2020-12-02 MED ORDER — AMLODIPINE BESYLATE 5 MG PO TABS
5.0000 mg | ORAL_TABLET | Freq: Every day | ORAL | Status: DC
  Administered 2020-12-02: 5 mg via ORAL

## 2020-12-02 MED ORDER — INSULIN DETEMIR 100 UNIT/ML ~~LOC~~ SOLN
12.0000 [IU] | Freq: Every day | SUBCUTANEOUS | Status: DC
  Administered 2020-12-02 – 2020-12-08 (×7): 12 [IU] via SUBCUTANEOUS
  Filled 2020-12-02 (×8): qty 0.12

## 2020-12-02 MED ORDER — LORAZEPAM 2 MG/ML IJ SOLN
0.5000 mg | Freq: Once | INTRAMUSCULAR | Status: AC
  Administered 2020-12-02: 0.5 mg via INTRAVENOUS
  Filled 2020-12-02: qty 1

## 2020-12-02 MED ORDER — APIXABAN 5 MG PO TABS
10.0000 mg | ORAL_TABLET | Freq: Two times a day (BID) | ORAL | Status: AC
  Administered 2020-12-02 – 2020-12-09 (×14): 10 mg via ORAL
  Filled 2020-12-02 (×15): qty 2

## 2020-12-02 NOTE — Progress Notes (Signed)
PROGRESS NOTE                                                                                                                                                                                                             Patient Demographics:    Emily Lozano, is a 70 y.o. female, DOB - 1951-02-02, NID:[id number]  Outpatient Primary MD for the patient is Marinda Elk, MD    LOS - 8  Admit date - 11/24/2020    Chief Complaint  Patient presents with  . Code Stroke       Brief Narrative (HPI from H&P) - Emily Lozano is a 70 y.o. female Hx of multiple cerebrovascular risk factors including hypertension, atrial fibrillation, carotid artery disease, dyslipidemia, DM type II who presented with sudden onset of left gaze preference, right facial droop, hemiparesis and expressive aphasia still with left hemispheric dysfunction. CTA head and neck with left M2 occlusion and though outside the window for IV TPA was a candidate for intervention now s/p unsuccessful revascularization, though there was some distal movement of the clot from M2 to M3; TIKI3, also found to have COVID-19 pneumonia, was seen by critical care required intubation to protect her airway, case complicated by development of right lower extremity DVT requiring heparin drip.  She was transferred to my care on 12/02/2020 on day 8 of her hospital stay.   Subjective:    Emily Lozano today in bed appears to be in no distress, eating breakfast with assistance, answer a few questions but I do not know how reliable her answers are, denies any headache, no shortness of breath.   Assessment  & Plan :     1.  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  In a patient with underlying A. fib, hypertension, dyslipidemia and DM type II.  She has been under neuro service, has had thorough stroke work-up, underwent  unsuccessful revascularization attempt by IR, currently undergoing PT OT and speech therapy.  Still has dense right-sided weakness and expressive aphasia along with some dysphagia, currently on aspirin, statin along with heparin drip for anticoagulation, case discussed with neurology on 12/02/2020 repeat head CT if stable switch to Eliquis.  2.  Dyslipidemia.  LDL was 70 on rosuvastatin 10 mg.  Continue  3.  Paroxysmal A. fib  RVR.  Mali vas 2 score of greater than 5, continue beta-blocker along with heparin drip >> Eliquis.  Echo shows a EF of 55 to 60% no acute findings  4.  Essential hypertension.  Goal of SBP under 160, currently on beta-blocker HCTZ along with lisinopril 5 mg, will add 5 mg of Norvasc on 12/02/2020 on day 8 of her CVA.  5.  Right lower extremity DVT.  Continue full anticoagulation.  6.  History of smoking.  Counseled to quit.  7.  COVID-19 pneumonia with acute hypoxic respiratory failure.  Required intubation and ICU stay, has completed Remdesivir course, tapering down Decadron.  She had some post intubation laryngeal edema and stridor which is much improved, currently on room air.  8.  DM type II.  On Levemir along with sliding scale, continue, dose adjusted as steroids being tapered.  Lab Results  Component Value Date   HGBA1C 10.6 (H) 11/25/2020   Lab Results  Component Value Date   CHOL 139 11/25/2020   HDL 15 (L) 11/25/2020   LDLCALC UNABLE TO CALCULATE IF TRIGLYCERIDE OVER 400 mg/dL 11/25/2020   LDLDIRECT 70.6 11/28/2020   TRIG 212 (H) 12/01/2020   CHOLHDL 9.3 11/25/2020   CBG (last 3)  Recent Labs    12/01/20 1229 12/01/20 1809 12/02/20 0759  GLUCAP 139* 186* 98    Encouraged the patient to sit up in chair in the daytime use I-S and flutter valve for pulmonary toiletry and then prone in bed when at night.  Will advance activity and titrate down oxygen as possible.         Condition - Extremely Guarded  Family Communication :    Rodena Goldmann  [phone] on 12/02/20 at 9:28 AM x 2 phone hung up. Son Erlene Quan [phone] on 12/02/2020 at 9:29    Code Status : Full  Consults  :  Neuro, PCCM  PUD Prophylaxis :  PPI   Procedures  :     Intubated and extubated in ICU.  Unsuccessful attempt at left M2 occlusion removal by IR      Disposition Plan  :    Status is: Inpatient  Remains inpatient appropriate because:IV treatments appropriate due to intensity of illness or inability to take PO   Dispo: The patient is from: Home              Anticipated d/c is to: SNF              Patient currently is not medically stable to d/c.   Difficult to place patient No  DVT Prophylaxis  :  Heparin >> Eliquis  Lab Results  Component Value Date   PLT 412 (H) 12/02/2020    Diet :  Diet Order            DIET - DYS 1 Room service appropriate? No; Fluid consistency: Honey Thick  Diet effective now                  Inpatient Medications  Scheduled Meds: . aspirin  325 mg Oral Daily  . chlorhexidine  15 mL Mouth Rinse BID  . dexamethasone  6 mg Oral Daily  . hydrochlorothiazide  12.5 mg Oral Daily  . insulin aspart  0-15 Units Subcutaneous TID WC  . insulin detemir  20 Units Subcutaneous Daily  . lisinopril  5 mg Oral Daily  . mouth rinse  15 mL Mouth Rinse q12n4p  . metoprolol tartrate  50 mg Oral BID  . mometasone-formoterol  2  puff Inhalation BID  . pantoprazole  40 mg Oral QHS  . potassium chloride  20 mEq Oral Daily  . rosuvastatin  10 mg Oral Daily  . sodium chloride flush  3 mL Intravenous Once   Continuous Infusions: . sodium chloride 50 mL/hr at 12/02/20 0656  . heparin 1,400 Units/hr (12/02/20 0656)   PRN Meds:.acetaminophen **OR** acetaminophen (TYLENOL) oral liquid 160 mg/5 mL **OR** acetaminophen, hydrALAZINE, Resource ThickenUp Clear, senna-docusate  Antibiotics  :    Anti-infectives (From admission, onward)   Start     Dose/Rate Route Frequency Ordered Stop   11/25/20 1000  remdesivir 100 mg in  sodium chloride 0.9 % 100 mL IVPB       "Followed by" Linked Group Details   100 mg 200 mL/hr over 30 Minutes Intravenous Daily 11/24/20 2119 11/28/20 1119   11/24/20 2215  remdesivir 200 mg in sodium chloride 0.9% 250 mL IVPB       "Followed by" Linked Group Details   200 mg 580 mL/hr over 30 Minutes Intravenous Once 11/24/20 2119 11/25/20 0932       Time Spent in minutes  30   Lala Lund M.D on 12/02/2020 at 9:25 AM  To page go to [email or web address]   [facility] -  Office  [phone]    See all Orders from today for further details    Objective:   Vitals:   12/02/20 0000 12/02/20 0132 12/02/20 0400 12/02/20 0756  BP: 122/86  (!) 164/90 (!) 156/79  Pulse: 70  72   Resp: _0 Temp: 98.2 F (36.8 C)  97.6 F (36.4 C) 97.9 F (36.6 C)  TempSrc: Oral  Oral Axillary  SpO2: 94%  92%   Weight:  70 kg    Height:        Wt Readings from Last 3 Encounters:  12/02/20 70 kg  05/25/18 74.4 kg  12/10/17 76.7 kg     Intake/Output Summary (Last 24 hours) at 12/02/2020 0925 Last data filed at 12/02/2020 0844 Gross per 24 hour  Intake 1786.06 ml  Output 300 ml  Net 1486.06 ml     Physical Exam  Awake, dense right-sided hemiparesis, expressive aphasia, some right-sided neglect Elk.AT,PERRAL Supple Neck,No JVD, No cervical lymphadenopathy appriciated.  Symmetrical Chest wall movement, Good air movement bilaterally, CTAB RRR,No Gallops,Rubs or new Murmurs, No Parasternal Heave +ve B.Sounds, Abd Soft, No tenderness, No organomegaly appriciated, No rebound - guarding or rigidity. No Cyanosis,     Data Review:    CBC Recent Labs  Lab 11/28/20 0215 11/29/20 0556 11/30/20 0311 12/01/20 0453 12/02/20 0432  WBC 11.0* 11.3* 8.7 16.5* 17.2*  HGB 10.8* 11.2* 11.2* 12.3 10.8*  HCT 33.5* 36.6 35.1* 37.6 32.5*  PLT 481* 515* 446* 319 412*  MCV 76.8* 79.4* 77.7* 77.4* 76.8*  MCH 24.8* 24.3* 24.8* 25.3* 25.5*  MCHC 32.2 30.6 31.9 32.7 33.2  RDW 16.0* 16.5*  16.4* 16.8* 17.1*  LYMPHSABS 1.0 1.6 1.5 1.4 1.6  MONOABS 0.8 0.9 0.5 1.0 0.7  EOSABS 0.0 0.0 0.1 0.0 0.1  BASOSABS 0.1 0.1 0.0 0.0 0.0    Recent Labs  Lab 11/26/20 0413 11/27/20 1544 11/28/20 0215 11/29/20 0556 11/30/20 0311 12/01/20 0453 12/02/20 0432  NA 139   < > 144 142 136 137 136  K 3.9   < > 3.3* 3.8 3.6 3.4* 3.7  CL 109   < > 110 108 104 102 104  CO2 15*   < > _1 21* 20*  GLUCOSE 301*   < > 283* 116* 135* 129* 138*  BUN 10   < > _0 CREATININE 0.72   < > 0.68 0.60 0.61 0.72 0.69  CALCIUM 8.1*   < > 8.5* 8.6* 8.2* 8.8* 8.5*  MG 2.3  --  2.1  --   --   --   --    < > = values in this interval not displayed.    ------------------------------------------------------------------------------------------------------------------ Recent Labs    12/01/20 0453  TRIG 212*    Lab Results  Component Value Date   HGBA1C 10.6 (H) 11/25/2020   ------------------------------------------------------------------------------------------------------------------ No results for input(s): TSH, T4TOTAL, T3FREE, THYROIDAB in the last 72 hours.  Invalid input(s): FREET3  Cardiac Enzymes No results for input(s): CKMB, TROPONINI, MYOGLOBIN in the last 168 hours.  Invalid input(s): CK ------------------------------------------------------------------------------------------------------------------ No results found for: BNP  Micro Results Recent Results (from the past 240 hour(s))  Resp Panel by RT-PCR (Flu A&B, Covid) Nasopharyngeal Swab     Status: Abnormal   Collection Time: 11/24/20  3:58 PM   Specimen: Nasopharyngeal Swab; Nasopharyngeal(NP) swabs in vial transport medium  Result Value Ref Range Status   SARS Coronavirus 2 by RT PCR POSITIVE (A) NEGATIVE Final    Comment: RESULT CALLED TO, READ BACK BY AND VERIFIED WITH: RN D WOHL AT 1733 11/24/2020 BY L BENFIELD (NOTE) SARS-CoV-2 target nucleic acids are DETECTED.  The SARS-CoV-2 RNA is generally  detectable in upper respiratory specimens during the acute phase of infection. Positive results are indicative of the presence of the identified virus, but do not rule out bacterial infection or co-infection with other pathogens not detected by the test. Clinical correlation with patient history and other diagnostic information is necessary to determine patient infection status. The expected result is Negative.  Fact Sheet for Patients: EntrepreneurPulse.com.au  Fact Sheet for Healthcare Providers: IncredibleEmployment.be  This test is not yet approved or cleared by the [place] FDA and  has been authorized for detection and/or diagnosis of SARS-CoV-2 by FDA under an Emergency Use Authorization (EUA).  This EUA will remain in effect (meaning this tes t can be used) for the duration of  the COVID-19 declaration under Section 564(b)(1) of the Act, 21 U.S.C. section 360bbb-3(b)(1), unless the authorization is terminated or revoked sooner.     Influenza A by PCR NEGATIVE NEGATIVE Final   Influenza B by PCR NEGATIVE NEGATIVE Final    Comment: (NOTE) The Xpert Xpress SARS-CoV-2/FLU/RSV plus assay is intended as an aid in the diagnosis of influenza from Nasopharyngeal swab specimens and should not be used as a sole basis for treatment. Nasal washings and aspirates are unacceptable for Xpert Xpress SARS-CoV-2/FLU/RSV testing.  Fact Sheet for Patients: EntrepreneurPulse.com.au  Fact Sheet for Healthcare Providers: IncredibleEmployment.be  This test is not yet approved or cleared by the [place] FDA and has been authorized for detection and/or diagnosis of SARS-CoV-2 by FDA under an Emergency Use Authorization (EUA). This EUA will remain in effect (meaning this test can be used) for the duration of the COVID-19 declaration under Section 564(b)(1) of the Act, 21 U.S.C. section 360bbb-3(b)(1), unless the  authorization is terminated or revoked.  Performed at [facility], [place] [address] [place],  [address]   MRSA PCR Screening     Status: None   Collection Time: 11/24/20  7:53 PM   Specimen: Nasal Mucosa; Nasopharyngeal  Result Value Ref Range Status   MRSA by PCR NEGATIVE NEGATIVE Final  Comment:        The GeneXpert MRSA Assay (FDA approved for NASAL specimens only), is one component of a comprehensive MRSA colonization surveillance program. It is not intended to diagnose MRSA infection nor to guide or monitor treatment for MRSA infections. Performed at [facility], [place] [address] [place], [place] [address]   Culture, blood (routine x 2)     Status: None   Collection Time: 11/25/20  7:16 PM   Specimen: BLOOD LEFT HAND  Result Value Ref Range Status   Specimen Description BLOOD LEFT HAND  Final   Special Requests   Final    BOTTLES DRAWN AEROBIC AND ANAEROBIC Blood Culture results may not be optimal due to an inadequate volume of blood received in culture bottles   Culture   Final    NO GROWTH 5 DAYS Performed at [facility], [place] [address] [place], [place] [address]    Report Status 11/30/2020 FINAL  Final  Culture, blood (routine x 2)     Status: None   Collection Time: 11/25/20  7:31 PM   Specimen: BLOOD RIGHT HAND  Result Value Ref Range Status   Specimen Description BLOOD RIGHT HAND  Final   Special Requests   Final    BOTTLES DRAWN AEROBIC AND ANAEROBIC Blood Culture results may not be optimal due to an inadequate volume of blood received in culture bottles   Culture   Final    NO GROWTH 5 DAYS Performed at [facility], [place] [address] [place], [place] [address]    Report Status 11/30/2020 FINAL  Final    Radiology Reports MR BRAIN WO CONTRAST  Result Date: 11/25/2020 CLINICAL DATA:  Acute stroke presentation 11/24/2020. Left insular infarction. EXAM: MRI HEAD WITHOUT CONTRAST TECHNIQUE: Multiplanar, multiecho pulse  sequences of the brain and surrounding structures were obtained without intravenous contrast. COMPARISON:  CT and interventional studies over the last day. FINDINGS: Brain: Diffusion imaging shows acute infarction in the deep insula and left frontoparietal cortical and subcortical brain. Mild swelling in the region. There are either minimal petechial blood products or there is susceptibility artifact related to thrombosed vessels. No frank hematoma. No mass effect or shift. Elsewhere, the brainstem and cerebellum are unremarkable. Both cerebral hemispheres show moderate to severe chronic small-vessel ischemic changes throughout the white matter. No hydrocephalus. No extra-axial collection. Vascular: Major vessels at base of the brain show flow. Skull and upper cervical spine: Negative Sinuses/Orbits: Mucosal inflammatory changes of paranasal sinuses. Orbits negative. Other: None IMPRESSION: 1. Acute infarction in the left MCA territory affecting the deep insula and left frontoparietal cortical and subcortical brain. Minimal petechial blood products or susceptibility artifact related to thrombosed vessels. No frank hematoma. No mass effect or shift. 2. Moderate to severe chronic small-vessel ischemic changes elsewhere throughout the cerebral hemispheric white matter. Electronically Signed   By: Nelson Chimes M.D.   On: 11/25/2020 02:18   IR CT Head Ltd  Result Date: 11/27/2020 INDICATION: 70 year old female with past medical history significant for rheumatoid arthritis, diabetes mellitus, hypertension, anemia and melanoma presenting with acute onset of right hemiparesis, aphasia and left gaze preference. She was found to be covered positive today. Last known well at 10:30 a.m. on 11/24/2020; modified Rankin scale 0; NIHSS 7. No IV tPA given as she was outside the window. Head CT showed an acute infarct the in the left insula and CT angiogram showed intracranial atherosclerotic disease with a left M2/MCA occlusion.  EXAM: Ultrasound-guided vascular access  Diagnostic cerebral angiogram Mechanical thrombectomy Flat panel head CT COMPARISON:  CT/CT angiogram of the head and neck November 24, 2020. MEDICATIONS: Refer to anesthesia documentation. ANESTHESIA/SEDATION: The procedure was performed under general anesthesia CONTRAST:  For 75 mL of Omnipaque 240 mg/mL FLUOROSCOPY TIME:  Fluoroscopy Time: 24 minutes 54 seconds (1,520 mGy). COMPLICATIONS: SIR Level A - No therapy, no consequence. TECHNIQUE: Informed written consent was obtained from the patient's son after a thorough discussion of the procedural risks, benefits and alternatives. All questions were addressed. Maximal Sterile Barrier Technique was utilized including caps, mask, sterile gowns, sterile gloves, sterile drape, hand hygiene and skin antiseptic. A timeout was performed prior to the initiation of the procedure. The right groin was prepped and draped in the usual sterile fashion. Using a micropuncture kit and the modified Seldinger technique, access was gained to the right common femoral artery and an 8 French sheath was placed. Real-time ultrasound guidance was utilized for vascular access including the acquisition of a permanent ultrasound image documenting patency of the accessed vessel. Under fluoroscopy, a Zoom 88 guide catheter was navigated over a 6 [place] Berenstein 2 catheter and a 0.035" Terumo Glidewire into the aortic arch. The catheter was placed into the left common carotid artery and then advanced into the left internal carotid artery. The inner catheter was removed. Frontal and lateral angiograms of the head were obtained. FINDINGS: 1. Occlusion of the mid left M2/MCA middle division branch. 2. Intracranial atherosclerotic disease with multiple luminal irregularities along the left MCA and left ACA vascular tree more significant at the A3 segment where there is moderate stenosis. 3. Patent right common femoral artery. PROCEDURE: Under biplane roadmap,  a zoom 55 aspiration catheter was navigated over an Aristotle 18 microguidewire into the cavernous segment of the left ICA and then advanced to the level of occlusion. The aspiration catheter was connected to a penumbra aspiration pump. The guiding catheter balloon was inflated. The aspiration catheter were removed under constant aspiration. Follow-up left ICA angiograms showed persistent occlusion which appear to have progressed more distally. Under biplane roadmap, a zoom 55 aspiration catheter was navigated over an Aristotle 18 microguidewire into the cavernous segment of the left ICA and then advanced to the level of occlusion. The aspiration catheter was connected to a penumbra aspiration pump. The guiding catheter balloon was inflated. The aspiration catheter were removed under constant aspiration. Follow-up left ICA angiograms showed no significant change. Under biplane roadmap, a zoom 55 aspiration catheter was navigated over a phenom 21 microcatheter and a Aristotle 18 microguidewire into the cavernous segment of the right ICA. The microcatheter was then navigated over the wire into the left M3/MCA middle division branch. Then, a 4 x 40 mm solitaire stent retriever was deployed spanning the left M2 and proximal M3 segment. The device was allowed to intercalated with the clot for 4 minutes. The microcatheter was removed. The aspiration catheter was advanced to the level of occlusion and connected to a penumbra aspiration pump. The thrombectomy device and aspiration catheter were removed under constant aspiration. Follow-up left ICA angiogram showed significant change. Under biplane roadmap, a zoom 55 aspiration catheter was navigated over a phenom 21 microcatheter and a Aristotle 18 microguidewire into the cavernous segment of the right ICA. The microcatheter was then navigated over the wire into the left M3/MCA middle division branch. Then, a 4 x 40 mm solitaire stent retriever was deployed spanning the  left M2 and proximal M3 segment. The device was allowed to intercalated with the clot  for 4 minutes. The microcatheter was removed. The aspiration catheter was advanced to the level of occlusion and connected to a penumbra aspiration pump. The thrombectomy device and aspiration catheter were removed under constant aspiration. Follow-up angiogram showed persistent occlusion. Flat panel CT of the head was obtained and post processed in a separate workstation with concurrent attending physician supervision. Selected images were sent to PACS. Small contrast extravasation in the left sylvian fissure noted. Follow-up angiogram showed no evidence of active contrast extravasation with persistent occlusion of a left M2 branch. Delay flat panel CT of the head was obtained and post processed in a separate workstation with concurrent attending physician supervision. Selected images were sent to PACS. Left sylvian contrast extravasation appears stable. Delayed left ICA angiograms confirmed no evidence of contrast extravasation. The catheter was subsequently withdrawn. Left common femoral artery angiograms were obtained in frontal and lateral views. The puncture is at the level of the common femoral artery which has adequate caliber for closure device. The sheath was exchanged for a Perclose ProGlide which was utilized for access closure. Immediate hemostasis was achieved. IMPRESSION: Unsuccessful attempted recanalization of a mid left M2/MCA middle division branch complicated by small amount contrast extravasation in the left sylvian fissure. No active extravasation seen at the end of the procedure. Left MCA status remains TICI2b, which is similar to presentation. PLAN: Patient is to remain intubated given unsuccessful recanalization and COVID positive test. She will be transferred to ICU for continued care. Electronically Signed   By: Pedro Earls M.D.   On: 11/27/2020 09:25   IR CT Head Ltd  Result Date:  11/27/2020 INDICATION: 70 year old female with past medical history significant for rheumatoid arthritis, diabetes mellitus, hypertension, anemia and melanoma presenting with acute onset of right hemiparesis, aphasia and left gaze preference. She was found to be covered positive today. Last known well at 10:30 a.m. on 11/24/2020; modified Rankin scale 0; NIHSS 7. No IV tPA given as she was outside the window. Head CT showed an acute infarct the in the left insula and CT angiogram showed intracranial atherosclerotic disease with a left M2/MCA occlusion. EXAM: Ultrasound-guided vascular access Diagnostic cerebral angiogram Mechanical thrombectomy Flat panel head CT COMPARISON:  CT/CT angiogram of the head and neck November 24, 2020. MEDICATIONS: Refer to anesthesia documentation. ANESTHESIA/SEDATION: The procedure was performed under general anesthesia CONTRAST:  For 75 mL of Omnipaque 240 mg/mL FLUOROSCOPY TIME:  Fluoroscopy Time: 24 minutes 54 seconds (1,520 mGy). COMPLICATIONS: SIR Level A - No therapy, no consequence. TECHNIQUE: Informed written consent was obtained from the patient's son after a thorough discussion of the procedural risks, benefits and alternatives. All questions were addressed. Maximal Sterile Barrier Technique was utilized including caps, mask, sterile gowns, sterile gloves, sterile drape, hand hygiene and skin antiseptic. A timeout was performed prior to the initiation of the procedure. The right groin was prepped and draped in the usual sterile fashion. Using a micropuncture kit and the modified Seldinger technique, access was gained to the right common femoral artery and an 8 French sheath was placed. Real-time ultrasound guidance was utilized for vascular access including the acquisition of a permanent ultrasound image documenting patency of the accessed vessel. Under fluoroscopy, a Zoom 88 guide catheter was navigated over a 6 [place] Berenstein 2 catheter and a 0.035" Terumo Glidewire into  the aortic arch. The catheter was placed into the left common carotid artery and then advanced into the left internal carotid artery. The inner catheter was removed. Frontal and lateral angiograms  of the head were obtained. FINDINGS: 1. Occlusion of the mid left M2/MCA middle division branch. 2. Intracranial atherosclerotic disease with multiple luminal irregularities along the left MCA and left ACA vascular tree more significant at the A3 segment where there is moderate stenosis. 3. Patent right common femoral artery. PROCEDURE: Under biplane roadmap, a zoom 55 aspiration catheter was navigated over an Aristotle 18 microguidewire into the cavernous segment of the left ICA and then advanced to the level of occlusion. The aspiration catheter was connected to a penumbra aspiration pump. The guiding catheter balloon was inflated. The aspiration catheter were removed under constant aspiration. Follow-up left ICA angiograms showed persistent occlusion which appear to have progressed more distally. Under biplane roadmap, a zoom 55 aspiration catheter was navigated over an Aristotle 18 microguidewire into the cavernous segment of the left ICA and then advanced to the level of occlusion. The aspiration catheter was connected to a penumbra aspiration pump. The guiding catheter balloon was inflated. The aspiration catheter were removed under constant aspiration. Follow-up left ICA angiograms showed no significant change. Under biplane roadmap, a zoom 55 aspiration catheter was navigated over a phenom 21 microcatheter and a Aristotle 18 microguidewire into the cavernous segment of the right ICA. The microcatheter was then navigated over the wire into the left M3/MCA middle division branch. Then, a 4 x 40 mm solitaire stent retriever was deployed spanning the left M2 and proximal M3 segment. The device was allowed to intercalated with the clot for 4 minutes. The microcatheter was removed. The aspiration catheter was advanced to  the level of occlusion and connected to a penumbra aspiration pump. The thrombectomy device and aspiration catheter were removed under constant aspiration. Follow-up left ICA angiogram showed significant change. Under biplane roadmap, a zoom 55 aspiration catheter was navigated over a phenom 21 microcatheter and a Aristotle 18 microguidewire into the cavernous segment of the right ICA. The microcatheter was then navigated over the wire into the left M3/MCA middle division branch. Then, a 4 x 40 mm solitaire stent retriever was deployed spanning the left M2 and proximal M3 segment. The device was allowed to intercalated with the clot for 4 minutes. The microcatheter was removed. The aspiration catheter was advanced to the level of occlusion and connected to a penumbra aspiration pump. The thrombectomy device and aspiration catheter were removed under constant aspiration. Follow-up angiogram showed persistent occlusion. Flat panel CT of the head was obtained and post processed in a separate workstation with concurrent attending physician supervision. Selected images were sent to PACS. Small contrast extravasation in the left sylvian fissure noted. Follow-up angiogram showed no evidence of active contrast extravasation with persistent occlusion of a left M2 branch. Delay flat panel CT of the head was obtained and post processed in a separate workstation with concurrent attending physician supervision. Selected images were sent to PACS. Left sylvian contrast extravasation appears stable. Delayed left ICA angiograms confirmed no evidence of contrast extravasation. The catheter was subsequently withdrawn. Left common femoral artery angiograms were obtained in frontal and lateral views. The puncture is at the level of the common femoral artery which has adequate caliber for closure device. The sheath was exchanged for a Perclose ProGlide which was utilized for access closure. Immediate hemostasis was achieved. IMPRESSION:  Unsuccessful attempted recanalization of a mid left M2/MCA middle division branch complicated by small amount contrast extravasation in the left sylvian fissure. No active extravasation seen at the end of the procedure. Left MCA status remains TICI2b, which is similar to presentation.  PLAN: Patient is to remain intubated given unsuccessful recanalization and COVID positive test. She will be transferred to ICU for continued care. Electronically Signed   By: Pedro Earls M.D.   On: 11/27/2020 09:25   IR US Guide Vasc Access Right  Result Date: 11/27/2020 INDICATION: 70 year old female with past medical history significant for rheumatoid arthritis, diabetes mellitus, hypertension, anemia and melanoma presenting with acute onset of right hemiparesis, aphasia and left gaze preference. She was found to be covered positive today. Last known well at 10:30 a.m. on 11/24/2020; modified Rankin scale 0; NIHSS 7. No IV tPA given as she was outside the window. Head CT showed an acute infarct the in the left insula and CT angiogram showed intracranial atherosclerotic disease with a left M2/MCA occlusion. EXAM: Ultrasound-guided vascular access Diagnostic cerebral angiogram Mechanical thrombectomy Flat panel head CT COMPARISON:  CT/CT angiogram of the head and neck November 24, 2020. MEDICATIONS: Refer to anesthesia documentation. ANESTHESIA/SEDATION: The procedure was performed under general anesthesia CONTRAST:  For 75 mL of Omnipaque 240 mg/mL FLUOROSCOPY TIME:  Fluoroscopy Time: 24 minutes 54 seconds (1,520 mGy). COMPLICATIONS: SIR Level A - No therapy, no consequence. TECHNIQUE: Informed written consent was obtained from the patient's son after a thorough discussion of the procedural risks, benefits and alternatives. All questions were addressed. Maximal Sterile Barrier Technique was utilized including caps, mask, sterile gowns, sterile gloves, sterile drape, hand hygiene and skin antiseptic. A timeout was  performed prior to the initiation of the procedure. The right groin was prepped and draped in the usual sterile fashion. Using a micropuncture kit and the modified Seldinger technique, access was gained to the right common femoral artery and an 8 French sheath was placed. Real-time ultrasound guidance was utilized for vascular access including the acquisition of a permanent ultrasound image documenting patency of the accessed vessel. Under fluoroscopy, a Zoom 88 guide catheter was navigated over a 6 [place] Berenstein 2 catheter and a 0.035" Terumo Glidewire into the aortic arch. The catheter was placed into the left common carotid artery and then advanced into the left internal carotid artery. The inner catheter was removed. Frontal and lateral angiograms of the head were obtained. FINDINGS: 1. Occlusion of the mid left M2/MCA middle division branch. 2. Intracranial atherosclerotic disease with multiple luminal irregularities along the left MCA and left ACA vascular tree more significant at the A3 segment where there is moderate stenosis. 3. Patent right common femoral artery. PROCEDURE: Under biplane roadmap, a zoom 55 aspiration catheter was navigated over an Aristotle 18 microguidewire into the cavernous segment of the left ICA and then advanced to the level of occlusion. The aspiration catheter was connected to a penumbra aspiration pump. The guiding catheter balloon was inflated. The aspiration catheter were removed under constant aspiration. Follow-up left ICA angiograms showed persistent occlusion which appear to have progressed more distally. Under biplane roadmap, a zoom 55 aspiration catheter was navigated over an Aristotle 18 microguidewire into the cavernous segment of the left ICA and then advanced to the level of occlusion. The aspiration catheter was connected to a penumbra aspiration pump. The guiding catheter balloon was inflated. The aspiration catheter were removed under constant aspiration.  Follow-up left ICA angiograms showed no significant change. Under biplane roadmap, a zoom 55 aspiration catheter was navigated over a phenom 21 microcatheter and a Aristotle 18 microguidewire into the cavernous segment of the right ICA. The microcatheter was then navigated over the wire into the left M3/MCA middle division branch. Then, a 4  x 40 mm solitaire stent retriever was deployed spanning the left M2 and proximal M3 segment. The device was allowed to intercalated with the clot for 4 minutes. The microcatheter was removed. The aspiration catheter was advanced to the level of occlusion and connected to a penumbra aspiration pump. The thrombectomy device and aspiration catheter were removed under constant aspiration. Follow-up left ICA angiogram showed significant change. Under biplane roadmap, a zoom 55 aspiration catheter was navigated over a phenom 21 microcatheter and a Aristotle 18 microguidewire into the cavernous segment of the right ICA. The microcatheter was then navigated over the wire into the left M3/MCA middle division branch. Then, a 4 x 40 mm solitaire stent retriever was deployed spanning the left M2 and proximal M3 segment. The device was allowed to intercalated with the clot for 4 minutes. The microcatheter was removed. The aspiration catheter was advanced to the level of occlusion and connected to a penumbra aspiration pump. The thrombectomy device and aspiration catheter were removed under constant aspiration. Follow-up angiogram showed persistent occlusion. Flat panel CT of the head was obtained and post processed in a separate workstation with concurrent attending physician supervision. Selected images were sent to PACS. Small contrast extravasation in the left sylvian fissure noted. Follow-up angiogram showed no evidence of active contrast extravasation with persistent occlusion of a left M2 branch. Delay flat panel CT of the head was obtained and post processed in a separate workstation  with concurrent attending physician supervision. Selected images were sent to PACS. Left sylvian contrast extravasation appears stable. Delayed left ICA angiograms confirmed no evidence of contrast extravasation. The catheter was subsequently withdrawn. Left common femoral artery angiograms were obtained in frontal and lateral views. The puncture is at the level of the common femoral artery which has adequate caliber for closure device. The sheath was exchanged for a Perclose ProGlide which was utilized for access closure. Immediate hemostasis was achieved. IMPRESSION: Unsuccessful attempted recanalization of a mid left M2/MCA middle division branch complicated by small amount contrast extravasation in the left sylvian fissure. No active extravasation seen at the end of the procedure. Left MCA status remains TICI2b, which is similar to presentation. PLAN: Patient is to remain intubated given unsuccessful recanalization and COVID positive test. She will be transferred to ICU for continued care. Electronically Signed   By: Pedro Earls M.D.   On: 11/27/2020 09:25   CT CEREBRAL PERFUSION W CONTRAST  Result Date: 11/24/2020 CLINICAL DATA:  Neuro deficit, acute, stroke suspected EXAM: CT PERFUSION BRAIN TECHNIQUE: Multiphase CT imaging of the brain was performed following IV bolus contrast injection. Subsequent parametric perfusion maps were calculated using RAPID software. CONTRAST:  27m OMNIPAQUE IOHEXOL 350 MG/ML SOLN COMPARISON:  Concurrent CTA head and neck. FINDINGS: CT Brain Perfusion Findings: CBF (<30%) Volume: 06mPerfusion (Tmax>6.0s) volume: 3058mismatch Volume: 56m73mPECTS on noncontrast CT Head: 9 at 16:42 today. Infarct Core: 0 mL Infarction Location:Not applicable. IMPRESSION: Perfusion imaging demonstrates no infarct core. 30 mL region of ischemia involving the left MCA territory. Electronically Signed   By: ChikPrimitivo Gauze.   On: 11/24/2020 16:55   DG Chest Port 1  View  Result Date: 11/24/2020 CLINICAL DATA:  COVID positive EXAM: PORTABLE CHEST 1 VIEW COMPARISON:  None. FINDINGS: The heart size and mediastinal contours are mildly enlarged. ETT is 4 cm above the carina. NG tube is seen below the diaphragm. Aortic knob calcifications are seen. There is hazy patchy airspace opacity seen at the periphery of the right lower  lung and at the left lung base. No pleural effusion. IMPRESSION: ETT and NG tube in satisfactory position Bilateral multifocal airspace opacities, consistent with multifocal pneumonia Electronically Signed   By: Prudencio Pair M.D.   On: 11/24/2020 22:14   IR PERCUTANEOUS ART THROMBECTOMY/INFUSION INTRACRANIAL INC DIAG ANGIO  Result Date: 11/27/2020 INDICATION: 70 year old female with past medical history significant for rheumatoid arthritis, diabetes mellitus, hypertension, anemia and melanoma presenting with acute onset of right hemiparesis, aphasia and left gaze preference. She was found to be covered positive today. Last known well at 10:30 a.m. on 11/24/2020; modified Rankin scale 0; NIHSS 7. No IV tPA given as she was outside the window. Head CT showed an acute infarct the in the left insula and CT angiogram showed intracranial atherosclerotic disease with a left M2/MCA occlusion. EXAM: Ultrasound-guided vascular access Diagnostic cerebral angiogram Mechanical thrombectomy Flat panel head CT COMPARISON:  CT/CT angiogram of the head and neck November 24, 2020. MEDICATIONS: Refer to anesthesia documentation. ANESTHESIA/SEDATION: The procedure was performed under general anesthesia CONTRAST:  For 75 mL of Omnipaque 240 mg/mL FLUOROSCOPY TIME:  Fluoroscopy Time: 24 minutes 54 seconds (1,520 mGy). COMPLICATIONS: SIR Level A - No therapy, no consequence. TECHNIQUE: Informed written consent was obtained from the patient's son after a thorough discussion of the procedural risks, benefits and alternatives. All questions were addressed. Maximal Sterile Barrier  Technique was utilized including caps, mask, sterile gowns, sterile gloves, sterile drape, hand hygiene and skin antiseptic. A timeout was performed prior to the initiation of the procedure. The right groin was prepped and draped in the usual sterile fashion. Using a micropuncture kit and the modified Seldinger technique, access was gained to the right common femoral artery and an 8 French sheath was placed. Real-time ultrasound guidance was utilized for vascular access including the acquisition of a permanent ultrasound image documenting patency of the accessed vessel. Under fluoroscopy, a Zoom 88 guide catheter was navigated over a 6 [place] Berenstein 2 catheter and a 0.035" Terumo Glidewire into the aortic arch. The catheter was placed into the left common carotid artery and then advanced into the left internal carotid artery. The inner catheter was removed. Frontal and lateral angiograms of the head were obtained. FINDINGS: 1. Occlusion of the mid left M2/MCA middle division branch. 2. Intracranial atherosclerotic disease with multiple luminal irregularities along the left MCA and left ACA vascular tree more significant at the A3 segment where there is moderate stenosis. 3. Patent right common femoral artery. PROCEDURE: Under biplane roadmap, a zoom 55 aspiration catheter was navigated over an Aristotle 18 microguidewire into the cavernous segment of the left ICA and then advanced to the level of occlusion. The aspiration catheter was connected to a penumbra aspiration pump. The guiding catheter balloon was inflated. The aspiration catheter were removed under constant aspiration. Follow-up left ICA angiograms showed persistent occlusion which appear to have progressed more distally. Under biplane roadmap, a zoom 55 aspiration catheter was navigated over an Aristotle 18 microguidewire into the cavernous segment of the left ICA and then advanced to the level of occlusion. The aspiration catheter was connected to a  penumbra aspiration pump. The guiding catheter balloon was inflated. The aspiration catheter were removed under constant aspiration. Follow-up left ICA angiograms showed no significant change. Under biplane roadmap, a zoom 55 aspiration catheter was navigated over a phenom 21 microcatheter and a Aristotle 18 microguidewire into the cavernous segment of the right ICA. The microcatheter was then navigated over the wire into the left M3/MCA middle division  branch. Then, a 4 x 40 mm solitaire stent retriever was deployed spanning the left M2 and proximal M3 segment. The device was allowed to intercalated with the clot for 4 minutes. The microcatheter was removed. The aspiration catheter was advanced to the level of occlusion and connected to a penumbra aspiration pump. The thrombectomy device and aspiration catheter were removed under constant aspiration. Follow-up left ICA angiogram showed significant change. Under biplane roadmap, a zoom 55 aspiration catheter was navigated over a phenom 21 microcatheter and a Aristotle 18 microguidewire into the cavernous segment of the right ICA. The microcatheter was then navigated over the wire into the left M3/MCA middle division branch. Then, a 4 x 40 mm solitaire stent retriever was deployed spanning the left M2 and proximal M3 segment. The device was allowed to intercalated with the clot for 4 minutes. The microcatheter was removed. The aspiration catheter was advanced to the level of occlusion and connected to a penumbra aspiration pump. The thrombectomy device and aspiration catheter were removed under constant aspiration. Follow-up angiogram showed persistent occlusion. Flat panel CT of the head was obtained and post processed in a separate workstation with concurrent attending physician supervision. Selected images were sent to PACS. Small contrast extravasation in the left sylvian fissure noted. Follow-up angiogram showed no evidence of active contrast extravasation with  persistent occlusion of a left M2 branch. Delay flat panel CT of the head was obtained and post processed in a separate workstation with concurrent attending physician supervision. Selected images were sent to PACS. Left sylvian contrast extravasation appears stable. Delayed left ICA angiograms confirmed no evidence of contrast extravasation. The catheter was subsequently withdrawn. Left common femoral artery angiograms were obtained in frontal and lateral views. The puncture is at the level of the common femoral artery which has adequate caliber for closure device. The sheath was exchanged for a Perclose ProGlide which was utilized for access closure. Immediate hemostasis was achieved. IMPRESSION: Unsuccessful attempted recanalization of a mid left M2/MCA middle division branch complicated by small amount contrast extravasation in the left sylvian fissure. No active extravasation seen at the end of the procedure. Left MCA status remains TICI2b, which is similar to presentation. PLAN: Patient is to remain intubated given unsuccessful recanalization and COVID positive test. She will be transferred to ICU for continued care. Electronically Signed   By: Pedro Earls M.D.   On: 11/27/2020 09:25   CT HEAD CODE STROKE WO CONTRAST  Result Date: 11/24/2020 CLINICAL DATA:  Code stroke.  Neuro deficit, acute, stroke suspected EXAM: CT HEAD WITHOUT CONTRAST TECHNIQUE: Contiguous axial images were obtained from the base of the skull through the vertex without intravenous contrast. COMPARISON:  06/27/2020. FINDINGS: Brain: No intracranial hemorrhage. New focal hypodensity involving the left insula. No mass lesion. No midline shift, ventriculomegaly or extra-axial fluid collection. Chronic microvascular ischemic changes. Vascular: No hyperdense vessel or unexpected calcification. Minimal carotid siphon atherosclerotic calcifications. Skull: Negative for fracture or focal lesion. Sinuses/Orbits: No acute  finding.  Mild pansinus mucosal thickening. Other: Right parietal scalp nodule is unchanged. ASPECTS [facility] Stroke Program Early CT Score) - Ganglionic level infarction (caudate, lentiform nuclei, internal capsule, insula, M1-M3 cortex): 6 - Supraganglionic infarction (M4-M6 cortex): 3 Total score (0-10 with 10 being normal): 9 IMPRESSION: 1. 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 2. Chronic microvascular ischemic changes. 3. ASPECTS is 9 Code stroke imaging results were communicated on 11/24/2020 at 4:23 pm to provider Dr. Rory Percy via secure text paging. Electronically Signed   By:  Primitivo Gauze M.D.   On: 11/24/2020 16:24   VAS [place] LOWER EXTREMITY VENOUS (DVT)  Result Date: 11/26/2020  Lower Venous DVT Study Indications: Stroke, and Covid-19.  Comparison Study: No prior study on file Performing Technologist: Sharion Dove RVS  Examination Guidelines: A complete evaluation includes B-mode imaging, spectral Doppler, color Doppler, and power Doppler as needed of all accessible portions of each vessel. Bilateral testing is considered an integral part of a complete examination. Limited examinations for reoccurring indications may be performed as noted. The reflux portion of the exam is performed with the patient in reverse Trendelenburg.  +---------+---------------+---------+-----------+----------+--------------+ RIGHT    CompressibilityPhasicitySpontaneityPropertiesThrombus Aging +---------+---------------+---------+-----------+----------+--------------+ CFV      Full           Yes      Yes                                 +---------+---------------+---------+-----------+----------+--------------+ SFJ      Full                                                        +---------+---------------+---------+-----------+----------+--------------+ FV Prox  Full                                                         +---------+---------------+---------+-----------+----------+--------------+ FV Mid   Full                                                        +---------+---------------+---------+-----------+----------+--------------+ FV DistalFull                                                        +---------+---------------+---------+-----------+----------+--------------+ PFV      Full                                                        +---------+---------------+---------+-----------+----------+--------------+ POP      Full           Yes      Yes                                 +---------+---------------+---------+-----------+----------+--------------+ PTV      Full                                                        +---------+---------------+---------+-----------+----------+--------------+ PERO  None                                         Acute          +---------+---------------+---------+-----------+----------+--------------+   +---------+---------------+---------+-----------+----------+--------------+ LEFT     CompressibilityPhasicitySpontaneityPropertiesThrombus Aging +---------+---------------+---------+-----------+----------+--------------+ CFV      Full           Yes      Yes                                 +---------+---------------+---------+-----------+----------+--------------+ SFJ      Full                                                        +---------+---------------+---------+-----------+----------+--------------+ FV Prox  Full                                                        +---------+---------------+---------+-----------+----------+--------------+ FV Mid   Full                                                        +---------+---------------+---------+-----------+----------+--------------+ FV DistalFull                                                         +---------+---------------+---------+-----------+----------+--------------+ PFV      Full                                                        +---------+---------------+---------+-----------+----------+--------------+ POP      Full           Yes      Yes                                 +---------+---------------+---------+-----------+----------+--------------+ PTV      Full                                                        +---------+---------------+---------+-----------+----------+--------------+ PERO     Full                                                        +---------+---------------+---------+-----------+----------+--------------+  Summary: RIGHT: - Findings consistent with acute deep vein thrombosis involving the right peroneal veins.  LEFT: - There is no evidence of deep vein thrombosis in the lower extremity.  *See table(s) above for measurements and observations. Electronically signed by Servando Snare MD on 11/26/2020 at 5:59:41 PM.    Final    ECHOCARDIOGRAM LIMITED  Result Date: 11/25/2020    ECHOCARDIOGRAM REPORT   Patient Name:   Emily Lozano [facility] Date of Exam: 11/25/2020 Medical Rec #:  [id number]        Height:       64.0 in Accession #:    [id number]       Weight:       176.4 lb Date of Birth:  06-16-1951        BSA:          1.855 m Patient Age:    48 years         BP:           119/66 mmHg Patient Gender: F                HR:           77 bpm. Exam Location:  Inpatient Procedure: Limited Echo, Cardiac Doppler and Color Doppler Indications:    Stroke I63.9  History:        Patient has no prior history of Echocardiogram examinations.                 Risk Factors:Hypertension, Diabetes and Former Smoker.  Sonographer:    Vickie Epley RDCS Referring Phys: [id number] ASHISH ARORA  Sonographer Comments: Echo performed with patient supine and on artificial respirator. Covid positive. IMPRESSIONS  1. Left ventricular ejection fraction, by estimation, is 55 to 60%. The  left ventricle has normal function. The left ventricle has no regional wall motion abnormalities. Left ventricular diastolic parameters are consistent with Grade I diastolic dysfunction (impaired relaxation).  2. Right ventricular systolic function is normal. The right ventricular size is normal.  3. The mitral valve is normal in structure. Trivial mitral valve regurgitation. No evidence of mitral stenosis.  4. The aortic valve is tricuspid. Aortic valve regurgitation is trivial. Mild aortic valve sclerosis is present, with no evidence of aortic valve stenosis.  5. The inferior vena cava is normal in size with greater than 50% respiratory variability, suggesting right atrial pressure of 3 mmHg. FINDINGS  Left Ventricle: Left ventricular ejection fraction, by estimation, is 55 to 60%. The left ventricle has normal function. The left ventricle has no regional wall motion abnormalities. The left ventricular internal cavity size was normal in size. There is  no left ventricular hypertrophy. Left ventricular diastolic parameters are consistent with Grade I diastolic dysfunction (impaired relaxation). Right Ventricle: The right ventricular size is normal.Right ventricular systolic function is normal. Left Atrium: Left atrial size was normal in size. Right Atrium: Right atrial size was normal in size. Pericardium: There is no evidence of pericardial effusion. Mitral Valve: The mitral valve is normal in structure. Trivial mitral valve regurgitation. No evidence of mitral valve stenosis. Tricuspid Valve: The tricuspid valve is normal in structure. Tricuspid valve regurgitation is trivial. No evidence of tricuspid stenosis. Aortic Valve: The aortic valve is tricuspid. Aortic valve regurgitation is trivial. Mild aortic valve sclerosis is present, with no evidence of aortic valve stenosis. Pulmonic Valve: The pulmonic valve was not well visualized. Pulmonic valve regurgitation is trivial. No evidence of pulmonic stenosis.  Aorta: The aortic root is normal in size  and structure. Venous: The inferior vena cava is normal in size with greater than 50% respiratory variability, suggesting right atrial pressure of 3 mmHg.  LEFT VENTRICLE PLAX 2D LVIDd:         4.10 cm     Diastology LVIDs:         3.20 cm     LV e' medial:    6.09 cm/s LV PW:         1.00 cm     LV E/e' medial:  11.1 LV IVS:        0.90 cm     LV e' lateral:   9.03 cm/s LVOT diam:     2.00 cm     LV E/e' lateral: 7.5 LV SV:         42 LV SV Index:   23 LVOT Area:     3.14 cm  LV Volumes (MOD) LV vol d, MOD A2C: 88.5 ml LV vol d, MOD A4C: 80.0 ml LV vol s, MOD A2C: 46.6 ml LV vol s, MOD A4C: 41.6 ml LV SV MOD A2C:     41.9 ml LV SV MOD A4C:     80.0 ml LV SV MOD BP:      40.6 ml RIGHT VENTRICLE RV S prime:     13.80 cm/s TAPSE (M-mode): 1.5 cm LEFT ATRIUM         Index LA diam:    2.70 cm 1.46 cm/m  AORTIC VALVE LVOT Vmax:   71.80 cm/s LVOT Vmean:  56.700 cm/s LVOT VTI:    0.134 m  AORTA Ao Root diam: 3.70 cm MITRAL VALVE MV Area (PHT): 3.85 cm    SHUNTS MV Decel Time: 197 msec    Systemic VTI:  0.13 m MV E velocity: 67.70 cm/s  Systemic Diam: 2.00 cm MV A velocity: 81.00 cm/s MV E/A ratio:  0.84 Kirk Ruths MD Electronically signed by Kirk Ruths MD Signature Date/Time: 11/25/2020/10:54:57 AM    Final    CT ANGIO HEAD CODE STROKE  Result Date: 11/24/2020 CLINICAL DATA:  Neuro deficit, acute, stroke suspected EXAM: CT ANGIOGRAPHY HEAD AND NECK TECHNIQUE: Multidetector CT imaging of the head and neck was performed using the standard protocol during bolus administration of intravenous contrast. Multiplanar CT image reconstructions and MIPs were obtained to evaluate the vascular anatomy. Carotid stenosis measurements (when applicable) are obtained utilizing NASCET criteria, using the distal internal carotid diameter as the denominator. CONTRAST:  91m OMNIPAQUE IOHEXOL 350 MG/ML SOLN COMPARISON:  None. FINDINGS: CTA NECK FINDINGS Aortic arch: Standard branching. Mild  aortic arch atherosclerotic calcifications. Mild proximal left subclavian artery narrowing secondary to atheromatous plaque. Otherwise patent great vessel origins. Right carotid system: Patent. Minimal proximal ICA atherosclerotic calcifications without significant narrowing. Left carotid system: Patent. 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 Vertebral arteries: Mild left vertebral artery origin narrowing secondary to atheromatous disease. Otherwise patent and codominant. Skeleton: No acute finding.  Mild spondylosis. Other neck: No adenopathy.  No soft tissue mass. Upper chest: Patchy and nodular bilateral upper lung opacities concerning for infectious/inflammatory foci. Review of the MIP images confirms the above findings CTA HEAD FINDINGS Anterior circulation: Minimal carotid siphon atherosclerotic calcifications. Patent ICAs. Patent ophthalmic artery origins. Right A1 segment hypoplasia. Patent ACAs. Patent right MCA. Left M2 segment occlusion. Posterior circulation: Patent V4 segments. Patent right PICA. Patent basilar and superior cerebellar arteries. Patent bilateral PCAs. Venous sinuses: As permitted by contrast timing, patent. Anatomic variants: Please see above. Review of the MIP  images confirms the above findings IMPRESSION: CTA neck: 30-40% distal left CCA narrowing secondary to atheromatous plaque. Mild narrowing of the proximal left subclavian and left vertebral artery origin. Bilateral upper lung nodular/patchy opacities concerning for infectious/inflammatory foci. CTA head: Left M2 branch occlusion. No aneurysm or dissection. Code stroke imaging results were communicated on 11/24/2020 at 4:41 pm to provider Dr. Rory Percy via secure text paging. Electronically Signed   By: Primitivo Gauze M.D.   On: 11/24/2020 16:41   CT ANGIO NECK CODE STROKE  Result Date: 11/24/2020 CLINICAL DATA:  Neuro deficit, acute, stroke suspected EXAM: CT ANGIOGRAPHY HEAD AND NECK TECHNIQUE:  Multidetector CT imaging of the head and neck was performed using the standard protocol during bolus administration of intravenous contrast. Multiplanar CT image reconstructions and MIPs were obtained to evaluate the vascular anatomy. Carotid stenosis measurements (when applicable) are obtained utilizing NASCET criteria, using the distal internal carotid diameter as the denominator. CONTRAST:  80m OMNIPAQUE IOHEXOL 350 MG/ML SOLN COMPARISON:  None. FINDINGS: CTA NECK FINDINGS Aortic arch: Standard branching. Mild aortic arch atherosclerotic calcifications. Mild proximal left subclavian artery narrowing secondary to atheromatous plaque. Otherwise patent great vessel origins. Right carotid system: Patent. Minimal proximal ICA atherosclerotic calcifications without significant narrowing. Left carotid system: Patent. 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 Vertebral arteries: Mild left vertebral artery origin narrowing secondary to atheromatous disease. Otherwise patent and codominant. Skeleton: No acute finding.  Mild spondylosis. Other neck: No adenopathy.  No soft tissue mass. Upper chest: Patchy and nodular bilateral upper lung opacities concerning for infectious/inflammatory foci. Review of the MIP images confirms the above findings CTA HEAD FINDINGS Anterior circulation: Minimal carotid siphon atherosclerotic calcifications. Patent ICAs. Patent ophthalmic artery origins. Right A1 segment hypoplasia. Patent ACAs. Patent right MCA. Left M2 segment occlusion. Posterior circulation: Patent V4 segments. Patent right PICA. Patent basilar and superior cerebellar arteries. Patent bilateral PCAs. Venous sinuses: As permitted by contrast timing, patent. Anatomic variants: Please see above. Review of the MIP images confirms the above findings IMPRESSION: CTA neck: 30-40% distal left CCA narrowing secondary to atheromatous plaque. Mild narrowing of the proximal left subclavian and left vertebral  artery origin. Bilateral upper lung nodular/patchy opacities concerning for infectious/inflammatory foci. CTA head: Left M2 branch occlusion. No aneurysm or dissection. Code stroke imaging results were communicated on 11/24/2020 at 4:41 pm to provider Dr. ARory Percyvia secure text paging. Electronically Signed   By: CPrimitivo GauzeM.D.   On: 11/24/2020 16:41

## 2020-12-02 NOTE — Progress Notes (Signed)
ANTICOAGULATION CONSULT NOTE - Follow Up Consult  Pharmacy Consult for Heparin Indication: RLE DVT   Patient Measurements: Height: 5\' 4"  (162.6 cm) Weight: 70 kg (154 lb 5.2 oz) IBW/kg (Calculated) : 54.7 Heparin Dosing Weight: 68.8 kg  Vital Signs: Temp: 97.6 F (36.4 C) (03/06 0400) Temp Source: Oral (03/06 0400) BP: 164/90 (03/06 0400) Pulse Rate: 72 (03/06 0400)  Labs: Recent Labs    11/30/20 0311 11/30/20 1818 12/01/20 0453 12/01/20 1216 12/02/20 0432  HGB 11.2*  --  12.3  --  10.8*  HCT 35.1*  --  37.6  --  32.5*  PLT 446*  --  319  --  412*  HEPARINUNFRC 0.51   < > 0.27* 0.49 0.42  CREATININE 0.61  --  0.72  --  0.69   < > = values in this interval not displayed.    Estimated Creatinine Clearance: 63.7 mL/min (by C-G formula based on SCr of 0.69 mg/dL).   Assessment: 46 yof continuing on heparin for RLE DVT, CVA.  Heparin level therapeutic (0.42) this morning. Hgb downtrend slightly, but no reports of bleeding. Plt and renal function stable.   Goal of Therapy:  Heparin level 0.3-0.5 units/ml Monitor platelets by anticoagulation protocol: Yes   Plan:  Continue heparin drip at 1400 units/hr Monitor daily heparin level and CBC, s/sx bleeding  Cephus Slater, PharmD, [facility] Pharmacy Resident [phone] 12/02/2020 7:22 AM

## 2020-12-02 NOTE — Progress Notes (Signed)
CSW received consult to reach out to patients son Emily Lozano regarding some questions and concerns he had for [facility] team. CSW called and no answer. CSW left voicemail and awaiting callback.  TOC team will continue to follow.

## 2020-12-02 NOTE — Progress Notes (Addendum)
ANTICOAGULATION CONSULT NOTE - Follow Up Consult  Pharmacy Consult for Heparin > apixaban Indication: RLE DVT   Patient Measurements: Height: 5\' 4"  (162.6 cm) Weight: 70 kg (154 lb 5.2 oz) IBW/kg (Calculated) : 54.7 Heparin Dosing Weight: 68.8 kg  Vital Signs: Temp: 98.7 F (37.1 C) (03/06 1604) Temp Source: Axillary (03/06 1604) BP: 168/72 (03/06 1604)  Labs: Recent Labs    11/30/20 0311 11/30/20 1818 12/01/20 0453 12/01/20 1216 12/02/20 0432  HGB 11.2*  --  12.3  --  10.8*  HCT 35.1*  --  37.6  --  32.5*  PLT 446*  --  319  --  412*  HEPARINUNFRC 0.51   < > 0.27* 0.49 0.42  CREATININE 0.61  --  0.72  --  0.69   < > = values in this interval not displayed.    Estimated Creatinine Clearance: 63.7 mL/min (by C-G formula based on SCr of 0.69 mg/dL).   Assessment: 64 yof who presented with L M2 stroke on IV heparin for acute DVT. Now transitioning to apixaban. H/H low, Plt high. CT head is stable today.   Goal of Therapy:  Heparin level 0.3-0.5 units/ml Monitor platelets by anticoagulation protocol: Yes   Plan:  Stop heparin drip Start apixaban 10 mg twice daily x 7 days followed by apixaban 5 mg twice daily  Monitor daily heparin level and CBC, s/sx bleeding Educate on apixaban prior to discharge   Albertina Parr, PharmD., BCPS, BCCCP Clinical Pharmacist Please refer to [facility] for unit-specific pharmacist

## 2020-12-02 NOTE — Progress Notes (Signed)
STROKE TEAM PROGRESS NOTE   INTERVAL HISTORY No family at bedside. Pt transferred to [place] floor room. Neuro stable. She still has global aphasia and right hemiplegia. Repeat CT head showed no frank hematoma or SAH. OK to switch heparin IV to eliquis today.   Vitals:   12/02/20 0400 12/02/20 0756 12/02/20 1215 12/02/20 1604  BP: (!) 164/90 (!) 156/79 (!) 145/70 (!) 168/72  Pulse: 72     Resp: 20 20 20 16   Temp: 97.6 F (36.4 C) 97.9 F (36.6 C) 98.3 F (36.8 C) 98.7 F (37.1 C)  TempSrc: Oral Axillary Axillary Axillary  SpO2: 92% 90% 94%   Weight:      Height:       CBC:  Recent Labs  Lab 12/01/20 0453 12/02/20 0432  WBC 16.5* 17.2*  NEUTROABS 13.7* 14.5*  HGB 12.3 10.8*  HCT 37.6 32.5*  MCV 77.4* 76.8*  PLT 319 528*   Basic Metabolic Panel:  Recent Labs  Lab 11/26/20 0413 11/27/20 0701 11/27/20 1544 11/28/20 0215 11/29/20 0556 12/01/20 0453 12/02/20 0432  NA 139  --    < > 144   < > 137 136  K 3.9  --    < > 3.3*   < > 3.4* 3.7  CL 109  --    < > 110   < > 102 104  CO2 15*  --    < > 23   < > 21* 20*  GLUCOSE 301*  --    < > 283*   < > 129* 138*  BUN 10  --    < > 18   < > 19 13  CREATININE 0.72  --    < > 0.68   < > 0.72 0.69  CALCIUM 8.1*  --    < > 8.5*   < > 8.8* 8.5*  MG 2.3  --   --  2.1  --   --   --   PHOS 2.7 1.7*  --  2.5  --   --   --    < > = values in this interval not displayed.   Lipid Panel:  Recent Labs  Lab 12/01/20 0453  TRIG 212*   HgbA1c:  No results for input(s): HGBA1C in the last 168 hours. IMAGING past 24 hours CT HEAD WO CONTRAST  Result Date: 12/02/2020 CLINICAL DATA:  Stroke follow-up EXAM: CT HEAD WITHOUT CONTRAST TECHNIQUE: Contiguous axial images were obtained from the base of the skull through the vertex without intravenous contrast. COMPARISON:  Brain MRI 11/25/2020 FINDINGS: Brain: Large superior division left MCA territory infarct matching the area of diffusion abnormality on prior. Mild petechial hemorrhage is  present and non progressed from MRI. No midline shift. Background of chronic small vessel ischemia in the cerebral white matter. No hydrocephalus or masslike finding. Vascular: Unchanged Skull: Normal. Negative for fracture or focal lesion. Sinuses/Orbits: Negative IMPRESSION: 1. Large recent left MCA territory infarct with mild petechial hemorrhage. No progression since brain MRI 11/25/2020. 2. Extensive chronic small vessel ischemia. Electronically Signed   By: Monte Fantasia M.D.   On: 12/02/2020 11:14   DG Chest Port 1 View  Result Date: 12/02/2020 CLINICAL DATA:  Shortness of breath EXAM: PORTABLE CHEST 1 VIEW COMPARISON:  November 24, 2020 FINDINGS: The cardiomediastinal silhouette is unchanged in contour.Interval extubation. Atherosclerotic calcifications of the aorta. No pleural effusion. No pneumothorax. Patchy RIGHT basilar peripheral opacities, similar in comparison to prior. Mildly improved aeration of the LEFT lung base in  comparison to prior. Visualized abdomen is unremarkable. Multilevel degenerative changes of the thoracic spine. IMPRESSION: 1. Patchy RIGHT basilar peripheral opacities, similar in comparison to prior and consistent with the sequela of COVID-19 infection. 2. Mildly improved aeration of the LEFT lung base in comparison to prior. Electronically Signed   By: Valentino Saxon MD   On: 12/02/2020 11:26    PHYSICAL EXAM  Temp:  [97.6 F (36.4 C)-98.7 F (37.1 C)] 98.7 F (37.1 C) (03/06 1604) Pulse Rate:  [68-72] 72 (03/06 0400) Resp:  [16-20] 16 (03/06 1604) BP: (122-168)/(64-90) 168/72 (03/06 1604) SpO2:  [90 %-100 %] 94 % (03/06 1215) Weight:  [70 kg] 70 kg (03/06 0132)  General - Well nourished, well developed, mild restless, not in acute distress.  Ophthalmologic - fundi not visualized due to noncooperation.  Cardiovascular - Regular rhythm and rate, not in afib.  Neuro - lethargic, eyes open, global aphasia, not able to follow simple commands, able to  pantomime limited commands. Able to make sounds but not meaningful, she did attempt to repeat 3-word sentences but largely intangible. Not able to name. Left gaze preference but able to have right gaze, blinking to visual threat on the left but not on the right, PERRL. Right significant facial droop. Tongue protrusion not cooperative. Left UE no drift and left LE 3/5 with stimulation. However, right UE flaccid and right LE mild withdraw to pain. Sensation, coordination not cooperative and gait not tested.   ASSESSMENT/PLAN Ms. HILDEGARD HLAVAC is a 70 y.o. female Hx of multiple cerebrovascular risk factors presenting with sudden onset of left gaze preference, right facial droop, hemiparesis and expressive aphasia still with left hemispheric dysfunction.   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 However, atherosclerosis is also likely part of the etiology  CT head Acute infarct involving the left insular cortex.  CTA head and neck Left M2 segment occlusion.   CT perfusion no core and 30cc ischemia.  IR unsuccessful IR with left M2 atherosclerosis  MRI  Acute infarction in the left MCA territory affecting the deep insula and left frontoparietal cortical and subcortical brain. Minimal petechial blood products or susceptibility artifact related to thrombosed vessels.   CT repeat 3/6 stable no progression, mild petechial hemorrhage  2D Echo 55 to 60%  LDL 70.6  HgbA1c 10.6  VTE prophylaxis - heparin IV  aspirin 81 mg daily prior to admission, now on heparin IV. Will switch to eliquis  Therapy recommendations: CIR  Disposition:  TBD  Atrial fibrillation RVR, new diagnosis  Amiodarone infusion  Rate control with Metoprolol 50 bid  Now on heparin IV   Will switch to eliquis   Acute DVT  LE venous doppler showed right peroneal vein DVT  Likely related to COVID  hypercoagulable state  Now on heparin IV  Will switch to eliquis  COVID pneumonia  CCM on board  Extubated   Off BIPAP, on room air  Still on decadron, completed remdesivir  Hypertension  Home meds: norvasc, hyzaar, metoprolol 100 daily  Off cleviprex . SBP goal <160 but gradually down to goal in 5-7 days . Metoprolol 50mg  bid, hydrochlorothiazide 12.5mg  daily, lisinopril 5 . Hydralazine prn . Long-term BP goal 130-150 given left M2 occlusion  Hyperlipidemia  Home meds:  Rosuvastatin 10mg , resumed in hospital  LDL goal < 70  LDL 70.6  Triglycerides 294  Continue statin at discharge  Diabetes type II  uncontrolled  Home meds:  glimepiride (AMARYL) metFORMIN (GLUCOPHAGE)   HgbA1c 10.6, goal < 7.0  Levemir 20units bid  CBGs  SSI  Close PCP follow up  Other Stroke Risk Factors  Advanced Age >/= 72   former Cigarette smoker  Other Active Problems  Hypokalemia, K 3.4->3.7  Leukocytosis WBC 4.7->16.5->17.2  Hospital day #8  Neurology will sign off. Please call with questions. Pt will follow up with stroke clinic NP at [facility] in about 4 weeks. Thanks for the consult.   Rosalin Hawking, MD PhD Stroke Neurology 12/02/2020 7:28 PM     To contact Stroke Continuity provider, please refer to [email or web address] After hours, contact General Neurology

## 2020-12-03 ENCOUNTER — Encounter (HOSPITAL_COMMUNITY): Payer: Self-pay | Admitting: Student in an Organized Health Care Education/Training Program

## 2020-12-03 ENCOUNTER — Other Ambulatory Visit: Payer: Self-pay

## 2020-12-03 ENCOUNTER — Inpatient Hospital Stay (HOSPITAL_COMMUNITY): Payer: Medicare Other

## 2020-12-03 DIAGNOSIS — I10 Essential (primary) hypertension: Secondary | ICD-10-CM | POA: Diagnosis not present

## 2020-12-03 LAB — COMPREHENSIVE METABOLIC PANEL
ALT: 18 U/L (ref 0–44)
AST: 16 U/L (ref 15–41)
Albumin: 2.5 g/dL — ABNORMAL LOW (ref 3.5–5.0)
Alkaline Phosphatase: 82 U/L (ref 38–126)
Anion gap: 10 (ref 5–15)
BUN: 11 mg/dL (ref 8–23)
CO2: 22 mmol/L (ref 22–32)
Calcium: 8.5 mg/dL — ABNORMAL LOW (ref 8.9–10.3)
Chloride: 102 mmol/L (ref 98–111)
Creatinine, Ser: 0.7 mg/dL (ref 0.44–1.00)
GFR, Estimated: >60 mL/min (ref >60)
Glucose, Bld: 162 mg/dL — ABNORMAL HIGH (ref 70–99)
Potassium: 3.6 mmol/L (ref 3.5–5.1)
Sodium: 134 mmol/L — ABNORMAL LOW (ref 135–145)
Total Bilirubin: 0.9 mg/dL (ref 0.3–1.2)
Total Protein: 6.7 g/dL (ref 6.5–8.1)

## 2020-12-03 LAB — CBC WITH DIFFERENTIAL/PLATELET
Abs Immature Granulocytes: 0.57 10*3/uL — ABNORMAL HIGH (ref 0.00–0.07)
Basophils Absolute: 0.1 10*3/uL (ref 0.0–0.1)
Basophils Relative: 0 %
Eosinophils Absolute: 0.1 10*3/uL (ref 0.0–0.5)
Eosinophils Relative: 0 %
HCT: 36.5 % (ref 36.0–46.0)
Hemoglobin: 11.3 g/dL — ABNORMAL LOW (ref 12.0–15.0)
Immature Granulocytes: 4 %
Lymphocytes Relative: 8 %
Lymphs Abs: 1.3 10*3/uL (ref 0.7–4.0)
MCH: 24.2 pg — ABNORMAL LOW (ref 26.0–34.0)
MCHC: 31 g/dL (ref 30.0–36.0)
MCV: 78.3 fL — ABNORMAL LOW (ref 80.0–100.0)
Monocytes Absolute: 0.7 10*3/uL (ref 0.1–1.0)
Monocytes Relative: 4 %
Neutro Abs: 13.7 10*3/uL — ABNORMAL HIGH (ref 1.7–7.7)
Neutrophils Relative %: 84 %
Platelets: 437 10*3/uL — ABNORMAL HIGH (ref 150–400)
RBC: 4.66 MIL/uL (ref 3.87–5.11)
RDW: 17 % — ABNORMAL HIGH (ref 11.5–15.5)
WBC: 16.4 10*3/uL — ABNORMAL HIGH (ref 4.0–10.5)
nRBC: 0 % (ref 0.0–0.2)

## 2020-12-03 LAB — BRAIN NATRIURETIC PEPTIDE: B Natriuretic Peptide: 386.1 pg/mL — ABNORMAL HIGH (ref 0.0–100.0)

## 2020-12-03 LAB — PROCALCITONIN: Procalcitonin: <0.1 ng/mL

## 2020-12-03 LAB — GLUCOSE, CAPILLARY
Glucose-Capillary: 107 mg/dL — ABNORMAL HIGH (ref 70–99)
Glucose-Capillary: 126 mg/dL — ABNORMAL HIGH (ref 70–99)
Glucose-Capillary: 183 mg/dL — ABNORMAL HIGH (ref 70–99)
Glucose-Capillary: 209 mg/dL — ABNORMAL HIGH (ref 70–99)

## 2020-12-03 LAB — C-REACTIVE PROTEIN: CRP: 5.9 mg/dL — ABNORMAL HIGH (ref <1.0)

## 2020-12-03 LAB — MAGNESIUM: Magnesium: 2.3 mg/dL (ref 1.7–2.4)

## 2020-12-03 MED ORDER — AMLODIPINE BESYLATE 10 MG PO TABS
10.0000 mg | ORAL_TABLET | Freq: Every day | ORAL | Status: DC
  Administered 2020-12-03 – 2020-12-10 (×8): 10 mg via ORAL
  Filled 2020-12-03 (×3): qty 1
  Filled 2020-12-03: qty 2
  Filled 2020-12-03 (×4): qty 1

## 2020-12-03 MED ORDER — HYDRALAZINE HCL 20 MG/ML IJ SOLN
10.0000 mg | Freq: Three times a day (TID) | INTRAMUSCULAR | Status: DC | PRN
  Administered 2020-12-04: 10 mg via INTRAVENOUS
  Filled 2020-12-03: qty 1

## 2020-12-03 NOTE — Plan of Care (Signed)
  Problem: Clinical Measurements: Goal: Ability to maintain clinical measurements within normal limits will improve Outcome: Progressing Goal: Diagnostic test results will improve Outcome: Progressing   Problem: Activity: Goal: Risk for activity intolerance will decrease Outcome: Progressing   Problem: Nutrition: Goal: Adequate nutrition will be maintained Outcome: Progressing   Problem: Safety: Goal: Ability to remain free from injury will improve Outcome: Progressing   Problem: Education: Goal: Knowledge of disease or condition will improve Outcome: Progressing Goal: Knowledge of secondary prevention will improve Outcome: Progressing Goal: Knowledge of patient specific risk factors addressed and post discharge goals established will improve Outcome: Progressing Goal: Individualized Educational Video(s) Outcome: Progressing   Problem: Coping: Goal: Will identify appropriate support needs Outcome: Progressing   Problem: Self-Care: Goal: Ability to participate in self-care as condition permits will improve Outcome: Progressing Goal: Ability to communicate needs accurately will improve Outcome: Progressing   Problem: Nutrition: Goal: Risk of aspiration will decrease Outcome: Progressing Goal: Dietary intake will improve Outcome: Progressing

## 2020-12-03 NOTE — Progress Notes (Signed)
Physical Therapy Treatment Patient Details Name: Emily Lozano MRN: [id number] DOB: 01-Sep-1951 Today's Date: 12/03/2020    History of Present Illness 70 y.o. female presents with acute onset R weakness, aphasia, left gaze. CTA demonstrates L M2 occlusion. Pt admitted and underwent thrombectomy on 11/24/2020 (unsuccessful). PMH: significant for hypertension, hyperlipidemia, type 2 diabetes mellitus, depression, GERD, melanoma, sleep apnea, and recent exposure to COVID-19 with a negative test at her PCP yesterday but diagnosed with acute bronchitis.    PT Comments    Pt agreeable to sit on EoB with therapy. Pt requires modA with HoB elevated to utilize rail to pull herself to EoB requires assist for R LE management and trunk to upright. Pt with eyes closed most of the time, and likely is having visual impact/double vision. Pt able to work on balance and weightbearing on R UE sitting for about 15 minutes prior to requiring maxA to return to bed. Pt exhausted and asleep before bed set up. D/c plans remain appropriate at this time. PT will continue to follow acutely.    Follow Up Recommendations  CIR;Supervision/Assistance - 24 hour     Equipment Recommendations  Other (comment) (TBD)    Recommendations for Other Services Rehab consult     Precautions / Restrictions Precautions Precautions: Fall Precaution Comments: COVID postive Restrictions Weight Bearing Restrictions: No    Mobility  Bed Mobility Overal bed mobility: Needs Assistance Bed Mobility: Rolling;Supine to Sit;Sit to Supine Rolling: Mod assist;Max assist   Supine to sit: Mod assist Sit to supine: Max assist   General bed mobility comments: Mod A to bring BLEs towards EOB and then elevate trunk. cuing to come to and stay in sidelying however unable to maintain while LE managed into bed, and ultimately requires maxA for returning to supine, once there able to assist with R LE management as pt bridged pelvis to center in  bed.    Transfers                 General transfer comment: did not attempt due to decreased assist available     Modified Rankin (Stroke Patients Only) Modified Rankin (Stroke Patients Only) Pre-Morbid Rankin Score: No symptoms Modified Rankin: Severe disability     Balance Overall balance assessment: Needs assistance Sitting-balance support: Feet supported Sitting balance-Leahy Scale: Poor Sitting balance - Comments: pt able to utilize bed rail on L to pull self into upright posture without outside assist however fatigues quickly needing assist at end of session Postural control: Right lateral lean;Posterior lean                                  Cognition Arousal/Alertness: Awake/alert Behavior During Therapy: WFL for tasks assessed/performed Overall Cognitive Status: Impaired/Different from baseline Area of Impairment: Attention;Following commands;Awareness;Problem solving                   Current Attention Level: Sustained   Following Commands: Follows one step commands with increased time   Awareness: Intellectual Problem Solving: Slow processing;Requires verbal cues;Requires tactile cues General Comments: Follows > 50% of cues today, prefers to have eyes closed, possible double vision, requires increased time for processing      Exercises Other Exercises Other Exercises: R lateral lean onto outstretched R arm assist to keep elbow extended, pt able to regulate weightbearing with L hand on bedrail Other Exercises: upright posture with manual cues to R trunk Other Exercises: cervical rotation L  and R    General Comments General comments (skin integrity, edema, etc.): VSS on RA      Pertinent Vitals/Pain Pain Assessment: Faces Faces Pain Scale: Hurts a little bit Pain Location: generalized Pain Descriptors / Indicators: Grimacing;Moaning Pain Intervention(s): Limited activity within patient's tolerance;Monitored during  session;Repositioned           PT Goals (current goals can now be found in the care plan section) Acute Rehab PT Goals Patient Stated Goal: Pt unstable to state PT Goal Formulation: Patient unable to participate in goal setting Time For Goal Achievement: 12/10/20 Potential to Achieve Goals: Fair Progress towards PT goals: Progressing toward goals    Frequency    Min 3X/week      PT Plan Current plan remains appropriate    Co-evaluation              AM-PAC PT "6 Clicks" Mobility   Outcome Measure  Help needed turning from your back to your side while in a flat bed without using bedrails?: A Lot Help needed moving from lying on your back to sitting on the side of a flat bed without using bedrails?: A Lot Help needed moving to and from a bed to a chair (including a wheelchair)?: A Lot Help needed standing up from a chair using your arms (e.g., wheelchair or bedside chair)?: A Lot Help needed to walk in hospital room?: Total Help needed climbing 3-5 steps with a railing? : Total 6 Click Score: 10    End of Session   Activity Tolerance: Patient tolerated treatment well;Patient limited by fatigue Patient left: in bed;with call bell/phone within reach;with nursing/sitter in room;with bed alarm set Nurse Communication: Mobility status PT Visit Diagnosis: Hemiplegia and hemiparesis;Other abnormalities of gait and mobility (R26.89) Hemiplegia - Right/Left: Right Hemiplegia - dominant/non-dominant: Dominant Hemiplegia - caused by: Cerebral infarction     Time: 1400-1430 PT Time Calculation (min) (ACUTE ONLY): 30 min  Charges:  $Neuromuscular Re-education: 23-37 mins                     Elizabeth B. Migdalia Dk PT, DPT Acute Rehabilitation Services Pager [phone] Office [phone]    [place] 12/03/2020, 3:24 PM

## 2020-12-03 NOTE — Progress Notes (Signed)
SLP Cancellation Note  Patient Details Name: Emily Lozano MRN: [id number] DOB: [birth year]   Cancelled treatment:       Reason Eval/Treat Not Completed: Patient at procedure or test/unavailable. Unable to assess diet tolerance or continue language treatment at this time, as pt is busy with nursing. Will continue efforts.  [name] B. Quentin Ore, [facility], [place] Speech Language Pathologist Office: [phone]  [name] 12/03/2020, 3:28 PM

## 2020-12-03 NOTE — Progress Notes (Signed)
PROGRESS NOTE                                                                                                                                                                                                             Patient Demographics:    Emily Lozano, is a 70 y.o. female, DOB - 18-Oct-1950, KGY:[id number]  Outpatient Primary MD for the patient is Marinda Elk, MD    LOS - 9  Admit date - 11/24/2020    Chief Complaint  Patient presents with  . Code Stroke       Brief Narrative (HPI from H&P) - Emily Lozano is a 70 y.o. female Hx of multiple cerebrovascular risk factors including hypertension, atrial fibrillation, carotid artery disease, dyslipidemia, DM type II who presented with sudden onset of left gaze preference, right facial droop, hemiparesis and expressive aphasia still with left hemispheric dysfunction. CTA head and neck with left M2 occlusion and though outside the window for IV TPA was a candidate for intervention now s/p unsuccessful revascularization, though there was some distal movement of the clot from M2 to M3; TIKI3, also found to have COVID-19 pneumonia, was seen by critical care required intubation to protect her airway, case complicated by development of right lower extremity DVT requiring heparin drip.  She was transferred to my care on 12/02/2020 on day 8 of her hospital stay.   Subjective:   Patient remains in bed, leaning towards the left side, appears to be in no distress, denies any headache or chest pain.  Slightly more sleepy this morning.   Assessment  & Plan :     1.  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  In a patient with underlying A. fib, hypertension, dyslipidemia and DM type II.  She has been under neuro service, has had thorough stroke work-up, underwent unsuccessful revascularization attempt by IR,  currently undergoing PT OT and speech therapy, most likely will require placement.  Still has dense right-sided weakness and expressive aphasia along with some dysphagia, currently on aspirin, statin along with heparin drip for anticoagulation, case discussed with neurology on 12/02/2020 now on Eliquis.  2.  Dyslipidemia.  LDL was 70 on rosuvastatin 10 mg.  Continue  3.  Paroxysmal A. fib RVR.  Mali vas 2 score of greater than 5,  continue beta-blocker along with heparin drip >> Eliquis.  Echo shows a EF of 55 to 60% no acute findings  4.  Essential hypertension.  Goal of SBP under 160, currently on beta-blocker HCTZ along with lisinopril 5 mg, will add 5 mg of Norvasc on 12/02/2020 on day 8 of her CVA.  5.  Right lower extremity DVT.  Continue full anticoagulation > Eliquis.  6.  History of smoking.  Counseled to quit.  7.  COVID-19 pneumonia with acute hypoxic respiratory failure.  Required intubation and ICU stay, has completed Remdesivir course, tapering down Decadron.  She had some post intubation laryngeal edema and stridor which is much improved, currently on room air.  8.  DM type II.  On Levemir along with sliding scale, continue, dose adjusted as steroids being tapered.  Lab Results  Component Value Date   HGBA1C 10.6 (H) 11/25/2020   Lab Results  Component Value Date   CHOL 139 11/25/2020   HDL 15 (L) 11/25/2020   LDLCALC UNABLE TO CALCULATE IF TRIGLYCERIDE OVER 400 mg/dL 11/25/2020   LDLDIRECT 70.6 11/28/2020   TRIG 212 (H) 12/01/2020   CHOLHDL 9.3 11/25/2020   CBG (last 3)  Recent Labs    12/02/20 1755 12/02/20 2044 12/03/20 0720  GLUCAP 122* 141* 107*    Encouraged the patient to sit up in chair in the daytime use I-S and flutter valve for pulmonary toiletry and then prone in bed when at night.  Will advance activity and titrate down oxygen as possible.         Condition - Extremely Guarded  Family Communication :    Rodena Goldmann [phone] on 12/02/20 at 9:28 AM  x 2 phone hung up, 12/03/20 at 10:28 AM no response. Son Erlene Quan [phone] on 12/02/2020 at 9:29, 12/03/2020 at 10:29 AM message left  Code Status : Full  Consults  :  Neuro, PCCM  PUD Prophylaxis :  PPI   Procedures  :     Intubated and extubated in ICU.  Unsuccessful attempt at left M2 occlusion removal by IR      Disposition Plan  :    Status is: Inpatient  Remains inpatient appropriate because:IV treatments appropriate due to intensity of illness or inability to take PO   Dispo: The patient is from: Home              Anticipated d/c is to: SNF              Patient currently is not medically stable to d/c.   Difficult to place patient No  DVT Prophylaxis  :  Heparin >> Eliquis  Lab Results  Component Value Date   PLT 437 (H) 12/03/2020    Diet :  Diet Order            DIET - DYS 1 Room service appropriate? No; Fluid consistency: Honey Thick  Diet effective now                  Inpatient Medications  Scheduled Meds: . amLODipine  10 mg Oral Daily  . apixaban  10 mg Oral BID   Followed by  . [START ON 12/09/2020] apixaban  5 mg Oral BID  . chlorhexidine  15 mL Mouth Rinse BID  . dexamethasone  2 mg Oral Daily  . insulin aspart  0-15 Units Subcutaneous TID WC  . insulin detemir  12 Units Subcutaneous Daily  . mouth rinse  15 mL Mouth Rinse q12n4p  . metoprolol tartrate  50 mg Oral BID  . mometasone-formoterol  2 puff Inhalation BID  . pantoprazole  40 mg Oral QHS  . potassium chloride  20 mEq Oral Daily  . rosuvastatin  10 mg Oral Daily  . sodium chloride flush  3 mL Intravenous Once   Continuous Infusions: . sodium chloride 50 mL/hr at 12/03/20 0400   PRN Meds:.acetaminophen **OR** acetaminophen (TYLENOL) oral liquid 160 mg/5 mL **OR** acetaminophen, hydrALAZINE, Resource ThickenUp Clear, senna-docusate  Antibiotics  :    Anti-infectives (From admission, onward)   Start     Dose/Rate Route Frequency Ordered Stop   11/25/20 1000  remdesivir 100 mg  in sodium chloride 0.9 % 100 mL IVPB       "Followed by" Linked Group Details   100 mg 200 mL/hr over 30 Minutes Intravenous Daily 11/24/20 2119 11/28/20 1119   11/24/20 2215  remdesivir 200 mg in sodium chloride 0.9% 250 mL IVPB       "Followed by" Linked Group Details   200 mg 580 mL/hr over 30 Minutes Intravenous Once 11/24/20 2119 11/25/20 0932       Time Spent in minutes  30   Lala Lund M.D on 12/03/2020 at 10:26 AM  To page go to [email or web address]   [facility] -  Office  [phone]    See all Orders from today for further details    Objective:   Vitals:   12/02/20 1604 12/02/20 1950 12/03/20 0400 12/03/20 0524  BP: (!) 168/72 (!) 180/79  (!) 150/73  Pulse:  76 75 62  Resp: 16 20 (!) 28 20  Temp: 98.7 F (37.1 C) 98 F (36.7 C)  98.8 F (37.1 C)  TempSrc: Axillary Axillary  Axillary  SpO2:  96% 90% 97%  Weight:      Height:        Wt Readings from Last 3 Encounters:  12/02/20 70 kg  05/25/18 74.4 kg  12/10/17 76.7 kg     Intake/Output Summary (Last 24 hours) at 12/03/2020 1026 Last data filed at 12/03/2020 1015 Gross per 24 hour  Intake 1212.18 ml  Output 1700 ml  Net -487.82 ml     Physical Exam  Awake, dense right-sided hemiparesis, expressive aphasia, right-sided neglect [place].AT,PERRAL Supple Neck,No JVD, No cervical lymphadenopathy appriciated.  Symmetrical Chest wall movement, Good air movement bilaterally, CTAB RRR,No Gallops, Rubs or new Murmurs, No Parasternal Heave +ve B.Sounds, Abd Soft, No tenderness, No organomegaly appriciated, No rebound - guarding or rigidity. No Cyanosis, Clubbing or edema, No new Rash or bruise     Data Review:    CBC Recent Labs  Lab 11/29/20 0556 11/30/20 0311 12/01/20 0453 12/02/20 0432 12/03/20 0118  WBC 11.3* 8.7 16.5* 17.2* 16.4*  HGB 11.2* 11.2* 12.3 10.8* 11.3*  HCT 36.6 35.1* 37.6 32.5* 36.5  PLT 515* 446* 319 412* 437*  MCV 79.4* 77.7* 77.4* 76.8* 78.3*  MCH 24.3* 24.8* 25.3* 25.5*  24.2*  MCHC 30.6 31.9 32.7 33.2 31.0  RDW 16.5* 16.4* 16.8* 17.1* 17.0*  LYMPHSABS 1.6 1.5 1.4 1.6 1.3  MONOABS 0.9 0.5 1.0 0.7 0.7  EOSABS 0.0 0.1 0.0 0.1 0.1  BASOSABS 0.1 0.0 0.0 0.0 0.1    Recent Labs  Lab 11/28/20 0215 11/29/20 0556 11/30/20 0311 12/01/20 0453 12/02/20 0432 12/03/20 0118  NA 144 142 136 137 136 134*  K 3.3* 3.8 3.6 3.4* 3.7 3.6  CL 110 108 104 102 104 102  CO2 _0 21* 20* 22  GLUCOSE 283* 116* 135* 129* 138*  162*  BUN _0 CREATININE 0.68 0.60 0.61 0.72 0.69 0.70  CALCIUM 8.5* 8.6* 8.2* 8.8* 8.5* 8.5*  AST  --   --   --   --   --  16  ALT  --   --   --   --   --  18  ALKPHOS  --   --   --   --   --  82  BILITOT  --   --   --   --   --  0.9  ALBUMIN  --   --   --   --   --  2.5*  MG 2.1  --   --   --   --  2.3  CRP  --   --   --   --   --  5.9*  BNP  --   --   --   --   --  386.1*    ------------------------------------------------------------------------------------------------------------------ Recent Labs    12/01/20 0453  TRIG 212*    Lab Results  Component Value Date   HGBA1C 10.6 (H) 11/25/2020   ------------------------------------------------------------------------------------------------------------------ No results for input(s): TSH, T4TOTAL, T3FREE, THYROIDAB in the last 72 hours.  Invalid input(s): FREET3  Cardiac Enzymes No results for input(s): CKMB, TROPONINI, MYOGLOBIN in the last 168 hours.  Invalid input(s): CK ------------------------------------------------------------------------------------------------------------------    Component Value Date/Time   BNP 386.1 (H) 12/03/2020 0118    Micro Results Recent Results (from the past 240 hour(s))  Resp Panel by RT-PCR (Flu A&B, Covid) Nasopharyngeal Swab     Status: Abnormal   Collection Time: 11/24/20  3:58 PM   Specimen: Nasopharyngeal Swab; Nasopharyngeal(NP) swabs in vial transport medium  Result Value Ref Range Status   SARS Coronavirus 2 by  RT PCR POSITIVE (A) NEGATIVE Final    Comment: RESULT CALLED TO, READ BACK BY AND VERIFIED WITH: RN D WOHL AT 1733 11/24/2020 BY L BENFIELD (NOTE) SARS-CoV-2 target nucleic acids are DETECTED.  The SARS-CoV-2 RNA is generally detectable in upper respiratory specimens during the acute phase of infection. Positive results are indicative of the presence of the identified virus, but do not rule out bacterial infection or co-infection with other pathogens not detected by the test. Clinical correlation with patient history and other diagnostic information is necessary to determine patient infection status. The expected result is Negative.  Fact Sheet for Patients: EntrepreneurPulse.com.au  Fact Sheet for Healthcare Providers: IncredibleEmployment.be  This test is not yet approved or cleared by the [place] FDA and  has been authorized for detection and/or diagnosis of SARS-CoV-2 by FDA under an Emergency Use Authorization (EUA).  This EUA will remain in effect (meaning this tes t can be used) for the duration of  the COVID-19 declaration under Section 564(b)(1) of the Act, 21 U.S.C. section 360bbb-3(b)(1), unless the authorization is terminated or revoked sooner.     Influenza A by PCR NEGATIVE NEGATIVE Final   Influenza B by PCR NEGATIVE NEGATIVE Final    Comment: (NOTE) The Xpert Xpress SARS-CoV-2/FLU/RSV plus assay is intended as an aid in the diagnosis of influenza from Nasopharyngeal swab specimens and should not be used as a sole basis for treatment. Nasal washings and aspirates are unacceptable for Xpert Xpress SARS-CoV-2/FLU/RSV testing.  Fact Sheet for Patients: EntrepreneurPulse.com.au  Fact Sheet for Healthcare Providers: IncredibleEmployment.be  This test is not yet approved or cleared by the [place] FDA and has been authorized for detection and/or diagnosis of SARS-CoV-2  by FDA under  an Emergency Use Authorization (EUA). This EUA will remain in effect (meaning this test can be used) for the duration of the COVID-19 declaration under Section 564(b)(1) of the Act, 21 U.S.C. section 360bbb-3(b)(1), unless the authorization is terminated or revoked.  Performed at [facility], [place] [address] [place], [place] [address]   MRSA PCR Screening     Status: None   Collection Time: 11/24/20  7:53 PM   Specimen: Nasal Mucosa; Nasopharyngeal  Result Value Ref Range Status   MRSA by PCR NEGATIVE NEGATIVE Final    Comment:        The GeneXpert MRSA Assay (FDA approved for NASAL specimens only), is one component of a comprehensive MRSA colonization surveillance program. It is not intended to diagnose MRSA infection nor to guide or monitor treatment for MRSA infections. Performed at [facility], [place] [address], [place], [place] [address]   Culture, blood (routine x 2)     Status: None   Collection Time: 11/25/20  7:16 PM   Specimen: BLOOD LEFT HAND  Result Value Ref Range Status   Specimen Description BLOOD LEFT HAND  Final   Special Requests   Final    BOTTLES DRAWN AEROBIC AND ANAEROBIC Blood Culture results may not be optimal due to an inadequate volume of blood received in culture bottles   Culture   Final    NO GROWTH 5 DAYS Performed at [facility], [place] [address] [place], [place] [address]    Report Status 11/30/2020 FINAL  Final  Culture, blood (routine x 2)     Status: None   Collection Time: 11/25/20  7:31 PM   Specimen: BLOOD RIGHT HAND  Result Value Ref Range Status   Specimen Description BLOOD RIGHT HAND  Final   Special Requests   Final    BOTTLES DRAWN AEROBIC AND ANAEROBIC Blood Culture results may not be optimal due to an inadequate volume of blood received in culture bottles   Culture   Final    NO GROWTH 5 DAYS Performed at [facility], [place] [address] [place],  [address]    Report Status 11/30/2020 FINAL   Final    Radiology Reports CT HEAD WO CONTRAST  Result Date: 12/02/2020 CLINICAL DATA:  Stroke follow-up EXAM: CT HEAD WITHOUT CONTRAST TECHNIQUE: Contiguous axial images were obtained from the base of the skull through the vertex without intravenous contrast. COMPARISON:  Brain MRI 11/25/2020 FINDINGS: Brain: Large superior division left MCA territory infarct matching the area of diffusion abnormality on prior. Mild petechial hemorrhage is present and non progressed from MRI. No midline shift. Background of chronic small vessel ischemia in the cerebral white matter. No hydrocephalus or masslike finding. Vascular: Unchanged Skull: Normal. Negative for fracture or focal lesion. Sinuses/Orbits: Negative IMPRESSION: 1. Large recent left MCA territory infarct with mild petechial hemorrhage. No progression since brain MRI 11/25/2020. 2. Extensive chronic small vessel ischemia. Electronically Signed   By: Monte Fantasia M.D.   On: 12/02/2020 11:14   MR BRAIN WO CONTRAST  Result Date: 11/25/2020 CLINICAL DATA:  Acute stroke presentation 11/24/2020. Left insular infarction. EXAM: MRI HEAD WITHOUT CONTRAST TECHNIQUE: Multiplanar, multiecho pulse sequences of the brain and surrounding structures were obtained without intravenous contrast. COMPARISON:  CT and interventional studies over the last day. FINDINGS: Brain: Diffusion imaging shows acute infarction in the deep insula and left frontoparietal cortical and subcortical brain. Mild swelling in the region. There are either minimal petechial  blood products or there is susceptibility artifact related to thrombosed vessels. No frank hematoma. No mass effect or shift. Elsewhere, the brainstem and cerebellum are unremarkable. Both cerebral hemispheres show moderate to severe chronic small-vessel ischemic changes throughout the white matter. No hydrocephalus. No extra-axial collection. Vascular: Major vessels at base of the brain show flow. Skull and upper cervical  spine: Negative Sinuses/Orbits: Mucosal inflammatory changes of paranasal sinuses. Orbits negative. Other: None IMPRESSION: 1. Acute infarction in the left MCA territory affecting the deep insula and left frontoparietal cortical and subcortical brain. Minimal petechial blood products or susceptibility artifact related to thrombosed vessels. No frank hematoma. No mass effect or shift. 2. Moderate to severe chronic small-vessel ischemic changes elsewhere throughout the cerebral hemispheric white matter. Electronically Signed   By: Nelson Chimes M.D.   On: 11/25/2020 02:18   IR CT Head Ltd  Result Date: 11/27/2020 INDICATION: 70 year old female with past medical history significant for rheumatoid arthritis, diabetes mellitus, hypertension, anemia and melanoma presenting with acute onset of right hemiparesis, aphasia and left gaze preference. She was found to be covered positive today. Last known well at 10:30 a.m. on 11/24/2020; modified Rankin scale 0; NIHSS 7. No IV tPA given as she was outside the window. Head CT showed an acute infarct the in the left insula and CT angiogram showed intracranial atherosclerotic disease with a left M2/MCA occlusion. EXAM: Ultrasound-guided vascular access Diagnostic cerebral angiogram Mechanical thrombectomy Flat panel head CT COMPARISON:  CT/CT angiogram of the head and neck November 24, 2020. MEDICATIONS: Refer to anesthesia documentation. ANESTHESIA/SEDATION: The procedure was performed under general anesthesia CONTRAST:  For 75 mL of Omnipaque 240 mg/mL FLUOROSCOPY TIME:  Fluoroscopy Time: 24 minutes 54 seconds (1,520 mGy). COMPLICATIONS: SIR Level A - No therapy, no consequence. TECHNIQUE: Informed written consent was obtained from the patient's son after a thorough discussion of the procedural risks, benefits and alternatives. All questions were addressed. Maximal Sterile Barrier Technique was utilized including caps, mask, sterile gowns, sterile gloves, sterile drape, hand  hygiene and skin antiseptic. A timeout was performed prior to the initiation of the procedure. The right groin was prepped and draped in the usual sterile fashion. Using a micropuncture kit and the modified Seldinger technique, access was gained to the right common femoral artery and an 8 French sheath was placed. Real-time ultrasound guidance was utilized for vascular access including the acquisition of a permanent ultrasound image documenting patency of the accessed vessel. Under fluoroscopy, a Zoom 88 guide catheter was navigated over a 6 [place] Berenstein 2 catheter and a 0.035" Terumo Glidewire into the aortic arch. The catheter was placed into the left common carotid artery and then advanced into the left internal carotid artery. The inner catheter was removed. Frontal and lateral angiograms of the head were obtained. FINDINGS: 1. Occlusion of the mid left M2/MCA middle division branch. 2. Intracranial atherosclerotic disease with multiple luminal irregularities along the left MCA and left ACA vascular tree more significant at the A3 segment where there is moderate stenosis. 3. Patent right common femoral artery. PROCEDURE: Under biplane roadmap, a zoom 55 aspiration catheter was navigated over an Aristotle 18 microguidewire into the cavernous segment of the left ICA and then advanced to the level of occlusion. The aspiration catheter was connected to a penumbra aspiration pump. The guiding catheter balloon was inflated. The aspiration catheter were removed under constant aspiration. Follow-up left ICA angiograms showed persistent occlusion which appear to have progressed more distally. Under biplane roadmap, a zoom 55 aspiration catheter  was navigated over an Aristotle 18 microguidewire into the cavernous segment of the left ICA and then advanced to the level of occlusion. The aspiration catheter was connected to a penumbra aspiration pump. The guiding catheter balloon was inflated. The aspiration catheter  were removed under constant aspiration. Follow-up left ICA angiograms showed no significant change. Under biplane roadmap, a zoom 55 aspiration catheter was navigated over a phenom 21 microcatheter and a Aristotle 18 microguidewire into the cavernous segment of the right ICA. The microcatheter was then navigated over the wire into the left M3/MCA middle division branch. Then, a 4 x 40 mm solitaire stent retriever was deployed spanning the left M2 and proximal M3 segment. The device was allowed to intercalated with the clot for 4 minutes. The microcatheter was removed. The aspiration catheter was advanced to the level of occlusion and connected to a penumbra aspiration pump. The thrombectomy device and aspiration catheter were removed under constant aspiration. Follow-up left ICA angiogram showed significant change. Under biplane roadmap, a zoom 55 aspiration catheter was navigated over a phenom 21 microcatheter and a Aristotle 18 microguidewire into the cavernous segment of the right ICA. The microcatheter was then navigated over the wire into the left M3/MCA middle division branch. Then, a 4 x 40 mm solitaire stent retriever was deployed spanning the left M2 and proximal M3 segment. The device was allowed to intercalated with the clot for 4 minutes. The microcatheter was removed. The aspiration catheter was advanced to the level of occlusion and connected to a penumbra aspiration pump. The thrombectomy device and aspiration catheter were removed under constant aspiration. Follow-up angiogram showed persistent occlusion. Flat panel CT of the head was obtained and post processed in a separate workstation with concurrent attending physician supervision. Selected images were sent to PACS. Small contrast extravasation in the left sylvian fissure noted. Follow-up angiogram showed no evidence of active contrast extravasation with persistent occlusion of a left M2 branch. Delay flat panel CT of the head was obtained and  post processed in a separate workstation with concurrent attending physician supervision. Selected images were sent to PACS. Left sylvian contrast extravasation appears stable. Delayed left ICA angiograms confirmed no evidence of contrast extravasation. The catheter was subsequently withdrawn. Left common femoral artery angiograms were obtained in frontal and lateral views. The puncture is at the level of the common femoral artery which has adequate caliber for closure device. The sheath was exchanged for a Perclose ProGlide which was utilized for access closure. Immediate hemostasis was achieved. IMPRESSION: Unsuccessful attempted recanalization of a mid left M2/MCA middle division branch complicated by small amount contrast extravasation in the left sylvian fissure. No active extravasation seen at the end of the procedure. Left MCA status remains TICI2b, which is similar to presentation. PLAN: Patient is to remain intubated given unsuccessful recanalization and COVID positive test. She will be transferred to ICU for continued care. Electronically Signed   By: Pedro Earls M.D.   On: 11/27/2020 09:25   IR CT Head Ltd  Result Date: 11/27/2020 INDICATION: 70 year old female with past medical history significant for rheumatoid arthritis, diabetes mellitus, hypertension, anemia and melanoma presenting with acute onset of right hemiparesis, aphasia and left gaze preference. She was found to be covered positive today. Last known well at 10:30 a.m. on 11/24/2020; modified Rankin scale 0; NIHSS 7. No IV tPA given as she was outside the window. Head CT showed an acute infarct the in the left insula and CT angiogram showed intracranial atherosclerotic disease with  a left M2/MCA occlusion. EXAM: Ultrasound-guided vascular access Diagnostic cerebral angiogram Mechanical thrombectomy Flat panel head CT COMPARISON:  CT/CT angiogram of the head and neck November 24, 2020. MEDICATIONS: Refer to anesthesia  documentation. ANESTHESIA/SEDATION: The procedure was performed under general anesthesia CONTRAST:  For 75 mL of Omnipaque 240 mg/mL FLUOROSCOPY TIME:  Fluoroscopy Time: 24 minutes 54 seconds (1,520 mGy). COMPLICATIONS: SIR Level A - No therapy, no consequence. TECHNIQUE: Informed written consent was obtained from the patient's son after a thorough discussion of the procedural risks, benefits and alternatives. All questions were addressed. Maximal Sterile Barrier Technique was utilized including caps, mask, sterile gowns, sterile gloves, sterile drape, hand hygiene and skin antiseptic. A timeout was performed prior to the initiation of the procedure. The right groin was prepped and draped in the usual sterile fashion. Using a micropuncture kit and the modified Seldinger technique, access was gained to the right common femoral artery and an 8 French sheath was placed. Real-time ultrasound guidance was utilized for vascular access including the acquisition of a permanent ultrasound image documenting patency of the accessed vessel. Under fluoroscopy, a Zoom 88 guide catheter was navigated over a 6 [place] Berenstein 2 catheter and a 0.035" Terumo Glidewire into the aortic arch. The catheter was placed into the left common carotid artery and then advanced into the left internal carotid artery. The inner catheter was removed. Frontal and lateral angiograms of the head were obtained. FINDINGS: 1. Occlusion of the mid left M2/MCA middle division branch. 2. Intracranial atherosclerotic disease with multiple luminal irregularities along the left MCA and left ACA vascular tree more significant at the A3 segment where there is moderate stenosis. 3. Patent right common femoral artery. PROCEDURE: Under biplane roadmap, a zoom 55 aspiration catheter was navigated over an Aristotle 18 microguidewire into the cavernous segment of the left ICA and then advanced to the level of occlusion. The aspiration catheter was connected to a  penumbra aspiration pump. The guiding catheter balloon was inflated. The aspiration catheter were removed under constant aspiration. Follow-up left ICA angiograms showed persistent occlusion which appear to have progressed more distally. Under biplane roadmap, a zoom 55 aspiration catheter was navigated over an Aristotle 18 microguidewire into the cavernous segment of the left ICA and then advanced to the level of occlusion. The aspiration catheter was connected to a penumbra aspiration pump. The guiding catheter balloon was inflated. The aspiration catheter were removed under constant aspiration. Follow-up left ICA angiograms showed no significant change. Under biplane roadmap, a zoom 55 aspiration catheter was navigated over a phenom 21 microcatheter and a Aristotle 18 microguidewire into the cavernous segment of the right ICA. The microcatheter was then navigated over the wire into the left M3/MCA middle division branch. Then, a 4 x 40 mm solitaire stent retriever was deployed spanning the left M2 and proximal M3 segment. The device was allowed to intercalated with the clot for 4 minutes. The microcatheter was removed. The aspiration catheter was advanced to the level of occlusion and connected to a penumbra aspiration pump. The thrombectomy device and aspiration catheter were removed under constant aspiration. Follow-up left ICA angiogram showed significant change. Under biplane roadmap, a zoom 55 aspiration catheter was navigated over a phenom 21 microcatheter and a Aristotle 18 microguidewire into the cavernous segment of the right ICA. The microcatheter was then navigated over the wire into the left M3/MCA middle division branch. Then, a 4 x 40 mm solitaire stent retriever was deployed spanning the left M2 and proximal M3 segment. The  device was allowed to intercalated with the clot for 4 minutes. The microcatheter was removed. The aspiration catheter was advanced to the level of occlusion and connected to a  penumbra aspiration pump. The thrombectomy device and aspiration catheter were removed under constant aspiration. Follow-up angiogram showed persistent occlusion. Flat panel CT of the head was obtained and post processed in a separate workstation with concurrent attending physician supervision. Selected images were sent to PACS. Small contrast extravasation in the left sylvian fissure noted. Follow-up angiogram showed no evidence of active contrast extravasation with persistent occlusion of a left M2 branch. Delay flat panel CT of the head was obtained and post processed in a separate workstation with concurrent attending physician supervision. Selected images were sent to PACS. Left sylvian contrast extravasation appears stable. Delayed left ICA angiograms confirmed no evidence of contrast extravasation. The catheter was subsequently withdrawn. Left common femoral artery angiograms were obtained in frontal and lateral views. The puncture is at the level of the common femoral artery which has adequate caliber for closure device. The sheath was exchanged for a Perclose ProGlide which was utilized for access closure. Immediate hemostasis was achieved. IMPRESSION: Unsuccessful attempted recanalization of a mid left M2/MCA middle division branch complicated by small amount contrast extravasation in the left sylvian fissure. No active extravasation seen at the end of the procedure. Left MCA status remains TICI2b, which is similar to presentation. PLAN: Patient is to remain intubated given unsuccessful recanalization and COVID positive test. She will be transferred to ICU for continued care. Electronically Signed   By: Pedro Earls M.D.   On: 11/27/2020 09:25   IR US Guide Vasc Access Right  Result Date: 11/27/2020 INDICATION: 70 year old female with past medical history significant for rheumatoid arthritis, diabetes mellitus, hypertension, anemia and melanoma presenting with acute onset of right  hemiparesis, aphasia and left gaze preference. She was found to be covered positive today. Last known well at 10:30 a.m. on 11/24/2020; modified Rankin scale 0; NIHSS 7. No IV tPA given as she was outside the window. Head CT showed an acute infarct the in the left insula and CT angiogram showed intracranial atherosclerotic disease with a left M2/MCA occlusion. EXAM: Ultrasound-guided vascular access Diagnostic cerebral angiogram Mechanical thrombectomy Flat panel head CT COMPARISON:  CT/CT angiogram of the head and neck November 24, 2020. MEDICATIONS: Refer to anesthesia documentation. ANESTHESIA/SEDATION: The procedure was performed under general anesthesia CONTRAST:  For 75 mL of Omnipaque 240 mg/mL FLUOROSCOPY TIME:  Fluoroscopy Time: 24 minutes 54 seconds (1,520 mGy). COMPLICATIONS: SIR Level A - No therapy, no consequence. TECHNIQUE: Informed written consent was obtained from the patient's son after a thorough discussion of the procedural risks, benefits and alternatives. All questions were addressed. Maximal Sterile Barrier Technique was utilized including caps, mask, sterile gowns, sterile gloves, sterile drape, hand hygiene and skin antiseptic. A timeout was performed prior to the initiation of the procedure. The right groin was prepped and draped in the usual sterile fashion. Using a micropuncture kit and the modified Seldinger technique, access was gained to the right common femoral artery and an 8 French sheath was placed. Real-time ultrasound guidance was utilized for vascular access including the acquisition of a permanent ultrasound image documenting patency of the accessed vessel. Under fluoroscopy, a Zoom 88 guide catheter was navigated over a 6 [place] Berenstein 2 catheter and a 0.035" Terumo Glidewire into the aortic arch. The catheter was placed into the left common carotid artery and then advanced into the left internal carotid  artery. The inner catheter was removed. Frontal and lateral  angiograms of the head were obtained. FINDINGS: 1. Occlusion of the mid left M2/MCA middle division branch. 2. Intracranial atherosclerotic disease with multiple luminal irregularities along the left MCA and left ACA vascular tree more significant at the A3 segment where there is moderate stenosis. 3. Patent right common femoral artery. PROCEDURE: Under biplane roadmap, a zoom 55 aspiration catheter was navigated over an Aristotle 18 microguidewire into the cavernous segment of the left ICA and then advanced to the level of occlusion. The aspiration catheter was connected to a penumbra aspiration pump. The guiding catheter balloon was inflated. The aspiration catheter were removed under constant aspiration. Follow-up left ICA angiograms showed persistent occlusion which appear to have progressed more distally. Under biplane roadmap, a zoom 55 aspiration catheter was navigated over an Aristotle 18 microguidewire into the cavernous segment of the left ICA and then advanced to the level of occlusion. The aspiration catheter was connected to a penumbra aspiration pump. The guiding catheter balloon was inflated. The aspiration catheter were removed under constant aspiration. Follow-up left ICA angiograms showed no significant change. Under biplane roadmap, a zoom 55 aspiration catheter was navigated over a phenom 21 microcatheter and a Aristotle 18 microguidewire into the cavernous segment of the right ICA. The microcatheter was then navigated over the wire into the left M3/MCA middle division branch. Then, a 4 x 40 mm solitaire stent retriever was deployed spanning the left M2 and proximal M3 segment. The device was allowed to intercalated with the clot for 4 minutes. The microcatheter was removed. The aspiration catheter was advanced to the level of occlusion and connected to a penumbra aspiration pump. The thrombectomy device and aspiration catheter were removed under constant aspiration. Follow-up left ICA angiogram  showed significant change. Under biplane roadmap, a zoom 55 aspiration catheter was navigated over a phenom 21 microcatheter and a Aristotle 18 microguidewire into the cavernous segment of the right ICA. The microcatheter was then navigated over the wire into the left M3/MCA middle division branch. Then, a 4 x 40 mm solitaire stent retriever was deployed spanning the left M2 and proximal M3 segment. The device was allowed to intercalated with the clot for 4 minutes. The microcatheter was removed. The aspiration catheter was advanced to the level of occlusion and connected to a penumbra aspiration pump. The thrombectomy device and aspiration catheter were removed under constant aspiration. Follow-up angiogram showed persistent occlusion. Flat panel CT of the head was obtained and post processed in a separate workstation with concurrent attending physician supervision. Selected images were sent to PACS. Small contrast extravasation in the left sylvian fissure noted. Follow-up angiogram showed no evidence of active contrast extravasation with persistent occlusion of a left M2 branch. Delay flat panel CT of the head was obtained and post processed in a separate workstation with concurrent attending physician supervision. Selected images were sent to PACS. Left sylvian contrast extravasation appears stable. Delayed left ICA angiograms confirmed no evidence of contrast extravasation. The catheter was subsequently withdrawn. Left common femoral artery angiograms were obtained in frontal and lateral views. The puncture is at the level of the common femoral artery which has adequate caliber for closure device. The sheath was exchanged for a Perclose ProGlide which was utilized for access closure. Immediate hemostasis was achieved. IMPRESSION: Unsuccessful attempted recanalization of a mid left M2/MCA middle division branch complicated by small amount contrast extravasation in the left sylvian fissure. No active extravasation  seen at the end of the  procedure. Left MCA status remains TICI2b, which is similar to presentation. PLAN: Patient is to remain intubated given unsuccessful recanalization and COVID positive test. She will be transferred to ICU for continued care. Electronically Signed   By: Pedro Earls M.D.   On: 11/27/2020 09:25   CT CEREBRAL PERFUSION W CONTRAST  Result Date: 11/24/2020 CLINICAL DATA:  Neuro deficit, acute, stroke suspected EXAM: CT PERFUSION BRAIN TECHNIQUE: Multiphase CT imaging of the brain was performed following IV bolus contrast injection. Subsequent parametric perfusion maps were calculated using RAPID software. CONTRAST:  29m OMNIPAQUE IOHEXOL 350 MG/ML SOLN COMPARISON:  Concurrent CTA head and neck. FINDINGS: CT Brain Perfusion Findings: CBF (<30%) Volume: 037mPerfusion (Tmax>6.0s) volume: 3090mismatch Volume: 80m29mPECTS on noncontrast CT Head: 9 at 16:42 today. Infarct Core: 0 mL Infarction Location:Not applicable. IMPRESSION: Perfusion imaging demonstrates no infarct core. 30 mL region of ischemia involving the left MCA territory. Electronically Signed   By: ChikPrimitivo Gauze.   On: 11/24/2020 16:55   DG Chest Port 1 View  Result Date: 12/03/2020 CLINICAL DATA:  Shortness of breath.  COVID-19 positive EXAM: PORTABLE CHEST 1 VIEW COMPARISON:  December 02, 2020 FINDINGS: Patchy airspace opacity is noted in the lung bases, more on the right than on the left. Lungs elsewhere clear. Heart is upper normal in size with pulmonary vascularity normal. No adenopathy. There is aortic atherosclerosis. There is calcification in each carotid artery. No bone lesions. IMPRESSION: Persistent airspace opacity bases, more on the right than the left, likely due to atypical organism pneumonia. No new opacity evident. Stable cardiac silhouette. Aortic Atherosclerosis (ICD10-I70.0). There are foci of carotid artery calcification bilaterally. Electronically Signed   By: WillLowella Grip M.D.    On: 12/03/2020 08:41   DG Chest Port 1 View  Result Date: 12/02/2020 CLINICAL DATA:  Shortness of breath EXAM: PORTABLE CHEST 1 VIEW COMPARISON:  November 24, 2020 FINDINGS: The cardiomediastinal silhouette is unchanged in contour.Interval extubation. Atherosclerotic calcifications of the aorta. No pleural effusion. No pneumothorax. Patchy RIGHT basilar peripheral opacities, similar in comparison to prior. Mildly improved aeration of the LEFT lung base in comparison to prior. Visualized abdomen is unremarkable. Multilevel degenerative changes of the thoracic spine. IMPRESSION: 1. Patchy RIGHT basilar peripheral opacities, similar in comparison to prior and consistent with the sequela of COVID-19 infection. 2. Mildly improved aeration of the LEFT lung base in comparison to prior. Electronically Signed   By: StepValentino Saxon  On: 12/02/2020 11:26   DG Chest Port 1 View  Result Date: 11/24/2020 CLINICAL DATA:  COVID positive EXAM: PORTABLE CHEST 1 VIEW COMPARISON:  None. FINDINGS: The heart size and mediastinal contours are mildly enlarged. ETT is 4 cm above the carina. NG tube is seen below the diaphragm. Aortic knob calcifications are seen. There is hazy patchy airspace opacity seen at the periphery of the right lower lung and at the left lung base. No pleural effusion. IMPRESSION: ETT and NG tube in satisfactory position Bilateral multifocal airspace opacities, consistent with multifocal pneumonia Electronically Signed   By: BindPrudencio Pair.   On: 11/24/2020 22:14   IR PERCUTANEOUS ART THROMBECTOMY/INFUSION INTRACRANIAL INC DIAG ANGIO  Result Date: 11/27/2020 INDICATION: 69 y47r old female with past medical history significant for rheumatoid arthritis, diabetes mellitus, hypertension, anemia and melanoma presenting with acute onset of right hemiparesis, aphasia and left gaze preference. She was found to be covered positive today. Last known well at 10:30 a.m. on 11/24/2020; modified Rankin scale 0;  NIHSS  7. No IV tPA given as she was outside the window. Head CT showed an acute infarct the in the left insula and CT angiogram showed intracranial atherosclerotic disease with a left M2/MCA occlusion. EXAM: Ultrasound-guided vascular access Diagnostic cerebral angiogram Mechanical thrombectomy Flat panel head CT COMPARISON:  CT/CT angiogram of the head and neck November 24, 2020. MEDICATIONS: Refer to anesthesia documentation. ANESTHESIA/SEDATION: The procedure was performed under general anesthesia CONTRAST:  For 75 mL of Omnipaque 240 mg/mL FLUOROSCOPY TIME:  Fluoroscopy Time: 24 minutes 54 seconds (1,520 mGy). COMPLICATIONS: SIR Level A - No therapy, no consequence. TECHNIQUE: Informed written consent was obtained from the patient's son after a thorough discussion of the procedural risks, benefits and alternatives. All questions were addressed. Maximal Sterile Barrier Technique was utilized including caps, mask, sterile gowns, sterile gloves, sterile drape, hand hygiene and skin antiseptic. A timeout was performed prior to the initiation of the procedure. The right groin was prepped and draped in the usual sterile fashion. Using a micropuncture kit and the modified Seldinger technique, access was gained to the right common femoral artery and an 8 French sheath was placed. Real-time ultrasound guidance was utilized for vascular access including the acquisition of a permanent ultrasound image documenting patency of the accessed vessel. Under fluoroscopy, a Zoom 88 guide catheter was navigated over a 6 [place] Berenstein 2 catheter and a 0.035" Terumo Glidewire into the aortic arch. The catheter was placed into the left common carotid artery and then advanced into the left internal carotid artery. The inner catheter was removed. Frontal and lateral angiograms of the head were obtained. FINDINGS: 1. Occlusion of the mid left M2/MCA middle division branch. 2. Intracranial atherosclerotic disease with multiple luminal  irregularities along the left MCA and left ACA vascular tree more significant at the A3 segment where there is moderate stenosis. 3. Patent right common femoral artery. PROCEDURE: Under biplane roadmap, a zoom 55 aspiration catheter was navigated over an Aristotle 18 microguidewire into the cavernous segment of the left ICA and then advanced to the level of occlusion. The aspiration catheter was connected to a penumbra aspiration pump. The guiding catheter balloon was inflated. The aspiration catheter were removed under constant aspiration. Follow-up left ICA angiograms showed persistent occlusion which appear to have progressed more distally. Under biplane roadmap, a zoom 55 aspiration catheter was navigated over an Aristotle 18 microguidewire into the cavernous segment of the left ICA and then advanced to the level of occlusion. The aspiration catheter was connected to a penumbra aspiration pump. The guiding catheter balloon was inflated. The aspiration catheter were removed under constant aspiration. Follow-up left ICA angiograms showed no significant change. Under biplane roadmap, a zoom 55 aspiration catheter was navigated over a phenom 21 microcatheter and a Aristotle 18 microguidewire into the cavernous segment of the right ICA. The microcatheter was then navigated over the wire into the left M3/MCA middle division branch. Then, a 4 x 40 mm solitaire stent retriever was deployed spanning the left M2 and proximal M3 segment. The device was allowed to intercalated with the clot for 4 minutes. The microcatheter was removed. The aspiration catheter was advanced to the level of occlusion and connected to a penumbra aspiration pump. The thrombectomy device and aspiration catheter were removed under constant aspiration. Follow-up left ICA angiogram showed significant change. Under biplane roadmap, a zoom 55 aspiration catheter was navigated over a phenom 21 microcatheter and a Aristotle 18 microguidewire into the  cavernous segment of the right ICA. The microcatheter was then navigated  over the wire into the left M3/MCA middle division branch. Then, a 4 x 40 mm solitaire stent retriever was deployed spanning the left M2 and proximal M3 segment. The device was allowed to intercalated with the clot for 4 minutes. The microcatheter was removed. The aspiration catheter was advanced to the level of occlusion and connected to a penumbra aspiration pump. The thrombectomy device and aspiration catheter were removed under constant aspiration. Follow-up angiogram showed persistent occlusion. Flat panel CT of the head was obtained and post processed in a separate workstation with concurrent attending physician supervision. Selected images were sent to PACS. Small contrast extravasation in the left sylvian fissure noted. Follow-up angiogram showed no evidence of active contrast extravasation with persistent occlusion of a left M2 branch. Delay flat panel CT of the head was obtained and post processed in a separate workstation with concurrent attending physician supervision. Selected images were sent to PACS. Left sylvian contrast extravasation appears stable. Delayed left ICA angiograms confirmed no evidence of contrast extravasation. The catheter was subsequently withdrawn. Left common femoral artery angiograms were obtained in frontal and lateral views. The puncture is at the level of the common femoral artery which has adequate caliber for closure device. The sheath was exchanged for a Perclose ProGlide which was utilized for access closure. Immediate hemostasis was achieved. IMPRESSION: Unsuccessful attempted recanalization of a mid left M2/MCA middle division branch complicated by small amount contrast extravasation in the left sylvian fissure. No active extravasation seen at the end of the procedure. Left MCA status remains TICI2b, which is similar to presentation. PLAN: Patient is to remain intubated given unsuccessful  recanalization and COVID positive test. She will be transferred to ICU for continued care. Electronically Signed   By: Pedro Earls M.D.   On: 11/27/2020 09:25   CT HEAD CODE STROKE WO CONTRAST  Result Date: 11/24/2020 CLINICAL DATA:  Code stroke.  Neuro deficit, acute, stroke suspected EXAM: CT HEAD WITHOUT CONTRAST TECHNIQUE: Contiguous axial images were obtained from the base of the skull through the vertex without intravenous contrast. COMPARISON:  06/27/2020. FINDINGS: Brain: No intracranial hemorrhage. New focal hypodensity involving the left insula. No mass lesion. No midline shift, ventriculomegaly or extra-axial fluid collection. Chronic microvascular ischemic changes. Vascular: No hyperdense vessel or unexpected calcification. Minimal carotid siphon atherosclerotic calcifications. Skull: Negative for fracture or focal lesion. Sinuses/Orbits: No acute finding.  Mild pansinus mucosal thickening. Other: Right parietal scalp nodule is unchanged. ASPECTS [facility] Stroke Program Early CT Score) - Ganglionic level infarction (caudate, lentiform nuclei, internal capsule, insula, M1-M3 cortex): 6 - Supraganglionic infarction (M4-M6 cortex): 3 Total score (0-10 with 10 being normal): 9 IMPRESSION: 1. 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 2. Chronic microvascular ischemic changes. 3. ASPECTS is 9 Code stroke imaging results were communicated on 11/24/2020 at 4:23 pm to provider Dr. Rory Percy via secure text paging. Electronically Signed   By: Primitivo Gauze M.D.   On: 11/24/2020 16:24   VAS [place] LOWER EXTREMITY VENOUS (DVT)  Result Date: 11/26/2020  Lower Venous DVT Study Indications: Stroke, and Covid-19.  Comparison Study: No prior study on file Performing Technologist: Sharion Dove RVS  Examination Guidelines: A complete evaluation includes B-mode imaging, spectral Doppler, color Doppler, and power Doppler as needed of all accessible portions of each vessel. Bilateral testing is  considered an integral part of a complete examination. Limited examinations for reoccurring indications may be performed as noted. The reflux portion of the exam is performed with the patient in reverse Trendelenburg.  +---------+---------------+---------+-----------+----------+--------------+  RIGHT    CompressibilityPhasicitySpontaneityPropertiesThrombus Aging +---------+---------------+---------+-----------+----------+--------------+ CFV      Full           Yes      Yes                                 +---------+---------------+---------+-----------+----------+--------------+ SFJ      Full                                                        +---------+---------------+---------+-----------+----------+--------------+ FV Prox  Full                                                        +---------+---------------+---------+-----------+----------+--------------+ FV Mid   Full                                                        +---------+---------------+---------+-----------+----------+--------------+ FV DistalFull                                                        +---------+---------------+---------+-----------+----------+--------------+ PFV      Full                                                        +---------+---------------+---------+-----------+----------+--------------+ POP      Full           Yes      Yes                                 +---------+---------------+---------+-----------+----------+--------------+ PTV      Full                                                        +---------+---------------+---------+-----------+----------+--------------+ PERO     None                                         Acute          +---------+---------------+---------+-----------+----------+--------------+   +---------+---------------+---------+-----------+----------+--------------+ LEFT      CompressibilityPhasicitySpontaneityPropertiesThrombus Aging +---------+---------------+---------+-----------+----------+--------------+ CFV      Full           Yes      Yes                                 +---------+---------------+---------+-----------+----------+--------------+  SFJ      Full                                                        +---------+---------------+---------+-----------+----------+--------------+ FV Prox  Full                                                        +---------+---------------+---------+-----------+----------+--------------+ FV Mid   Full                                                        +---------+---------------+---------+-----------+----------+--------------+ FV DistalFull                                                        +---------+---------------+---------+-----------+----------+--------------+ PFV      Full                                                        +---------+---------------+---------+-----------+----------+--------------+ POP      Full           Yes      Yes                                 +---------+---------------+---------+-----------+----------+--------------+ PTV      Full                                                        +---------+---------------+---------+-----------+----------+--------------+ PERO     Full                                                        +---------+---------------+---------+-----------+----------+--------------+     Summary: RIGHT: - Findings consistent with acute deep vein thrombosis involving the right peroneal veins.  LEFT: - There is no evidence of deep vein thrombosis in the lower extremity.  *See table(s) above for measurements and observations. Electronically signed by Servando Snare MD on 11/26/2020 at 5:59:41 PM.    Final    ECHOCARDIOGRAM LIMITED  Result Date: 11/25/2020    ECHOCARDIOGRAM REPORT   Patient Name:   Emily Lozano  Date of Exam: 11/25/2020 Medical Rec #:  [id number]        Height:       64.0 in Accession #:    [id number]  Weight:       176.4 lb Date of Birth:  [birth year]        BSA:          1.855 m Patient Age:    31 years         BP:           119/66 mmHg Patient Gender: F                HR:           77 bpm. Exam Location:  Inpatient Procedure: Limited Echo, Cardiac Doppler and Color Doppler Indications:    Stroke I63.9  History:        Patient has no prior history of Echocardiogram examinations.                 Risk Factors:Hypertension, Diabetes and Former Smoker.  Sonographer:    Vickie Epley RDCS Referring Phys: [id number] ASHISH ARORA  Sonographer Comments: Echo performed with patient supine and on artificial respirator. Covid positive. IMPRESSIONS  1. Left ventricular ejection fraction, by estimation, is 55 to 60%. The left ventricle has normal function. The left ventricle has no regional wall motion abnormalities. Left ventricular diastolic parameters are consistent with Grade I diastolic dysfunction (impaired relaxation).  2. Right ventricular systolic function is normal. The right ventricular size is normal.  3. The mitral valve is normal in structure. Trivial mitral valve regurgitation. No evidence of mitral stenosis.  4. The aortic valve is tricuspid. Aortic valve regurgitation is trivial. Mild aortic valve sclerosis is present, with no evidence of aortic valve stenosis.  5. The inferior vena cava is normal in size with greater than 50% respiratory variability, suggesting right atrial pressure of 3 mmHg. FINDINGS  Left Ventricle: Left ventricular ejection fraction, by estimation, is 55 to 60%. The left ventricle has normal function. The left ventricle has no regional wall motion abnormalities. The left ventricular internal cavity size was normal in size. There is  no left ventricular hypertrophy. Left ventricular diastolic parameters are consistent with Grade I diastolic dysfunction (impaired relaxation). Right  Ventricle: The right ventricular size is normal.Right ventricular systolic function is normal. Left Atrium: Left atrial size was normal in size. Right Atrium: Right atrial size was normal in size. Pericardium: There is no evidence of pericardial effusion. Mitral Valve: The mitral valve is normal in structure. Trivial mitral valve regurgitation. No evidence of mitral valve stenosis. Tricuspid Valve: The tricuspid valve is normal in structure. Tricuspid valve regurgitation is trivial. No evidence of tricuspid stenosis. Aortic Valve: The aortic valve is tricuspid. Aortic valve regurgitation is trivial. Mild aortic valve sclerosis is present, with no evidence of aortic valve stenosis. Pulmonic Valve: The pulmonic valve was not well visualized. Pulmonic valve regurgitation is trivial. No evidence of pulmonic stenosis. Aorta: The aortic root is normal in size and structure. Venous: The inferior vena cava is normal in size with greater than 50% respiratory variability, suggesting right atrial pressure of 3 mmHg.  LEFT VENTRICLE PLAX 2D LVIDd:         4.10 cm     Diastology LVIDs:         3.20 cm     LV e' medial:    6.09 cm/s LV PW:         1.00 cm     LV E/e' medial:  11.1 LV IVS:        0.90 cm     LV e' lateral:   9.03 cm/s LVOT diam:  2.00 cm     LV E/e' lateral: 7.5 LV SV:         42 LV SV Index:   23 LVOT Area:     3.14 cm  LV Volumes (MOD) LV vol d, MOD A2C: 88.5 ml LV vol d, MOD A4C: 80.0 ml LV vol s, MOD A2C: 46.6 ml LV vol s, MOD A4C: 41.6 ml LV SV MOD A2C:     41.9 ml LV SV MOD A4C:     80.0 ml LV SV MOD BP:      40.6 ml RIGHT VENTRICLE RV S prime:     13.80 cm/s TAPSE (M-mode): 1.5 cm LEFT ATRIUM         Index LA diam:    2.70 cm 1.46 cm/m  AORTIC VALVE LVOT Vmax:   71.80 cm/s LVOT Vmean:  56.700 cm/s LVOT VTI:    0.134 m  AORTA Ao Root diam: 3.70 cm MITRAL VALVE MV Area (PHT): 3.85 cm    SHUNTS MV Decel Time: 197 msec    Systemic VTI:  0.13 m MV E velocity: 67.70 cm/s  Systemic Diam: 2.00 cm MV A  velocity: 81.00 cm/s MV E/A ratio:  0.84 Kirk Ruths MD Electronically signed by Kirk Ruths MD Signature Date/Time: 11/25/2020/10:54:57 AM    Final    CT ANGIO HEAD CODE STROKE  Result Date: 11/24/2020 CLINICAL DATA:  Neuro deficit, acute, stroke suspected EXAM: CT ANGIOGRAPHY HEAD AND NECK TECHNIQUE: Multidetector CT imaging of the head and neck was performed using the standard protocol during bolus administration of intravenous contrast. Multiplanar CT image reconstructions and MIPs were obtained to evaluate the vascular anatomy. Carotid stenosis measurements (when applicable) are obtained utilizing NASCET criteria, using the distal internal carotid diameter as the denominator. CONTRAST:  69m OMNIPAQUE IOHEXOL 350 MG/ML SOLN COMPARISON:  None. FINDINGS: CTA NECK FINDINGS Aortic arch: Standard branching. Mild aortic arch atherosclerotic calcifications. Mild proximal left subclavian artery narrowing secondary to atheromatous plaque. Otherwise patent great vessel origins. Right carotid system: Patent. Minimal proximal ICA atherosclerotic calcifications without significant narrowing. Left carotid system: Patent. 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 Vertebral arteries: Mild left vertebral artery origin narrowing secondary to atheromatous disease. Otherwise patent and codominant. Skeleton: No acute finding.  Mild spondylosis. Other neck: No adenopathy.  No soft tissue mass. Upper chest: Patchy and nodular bilateral upper lung opacities concerning for infectious/inflammatory foci. Review of the MIP images confirms the above findings CTA HEAD FINDINGS Anterior circulation: Minimal carotid siphon atherosclerotic calcifications. Patent ICAs. Patent ophthalmic artery origins. Right A1 segment hypoplasia. Patent ACAs. Patent right MCA. Left M2 segment occlusion. Posterior circulation: Patent V4 segments. Patent right PICA. Patent basilar and superior cerebellar arteries. Patent bilateral  PCAs. Venous sinuses: As permitted by contrast timing, patent. Anatomic variants: Please see above. Review of the MIP images confirms the above findings IMPRESSION: CTA neck: 30-40% distal left CCA narrowing secondary to atheromatous plaque. Mild narrowing of the proximal left subclavian and left vertebral artery origin. Bilateral upper lung nodular/patchy opacities concerning for infectious/inflammatory foci. CTA head: Left M2 branch occlusion. No aneurysm or dissection. Code stroke imaging results were communicated on 11/24/2020 at 4:41 pm to provider Dr. ARory Percyvia secure text paging. Electronically Signed   By: CPrimitivo GauzeM.D.   On: 11/24/2020 16:41   CT ANGIO NECK CODE STROKE  Result Date: 11/24/2020 CLINICAL DATA:  Neuro deficit, acute, stroke suspected EXAM: CT ANGIOGRAPHY HEAD AND NECK TECHNIQUE: Multidetector CT imaging of the head and neck was  performed using the standard protocol during bolus administration of intravenous contrast. Multiplanar CT image reconstructions and MIPs were obtained to evaluate the vascular anatomy. Carotid stenosis measurements (when applicable) are obtained utilizing NASCET criteria, using the distal internal carotid diameter as the denominator. CONTRAST:  84m OMNIPAQUE IOHEXOL 350 MG/ML SOLN COMPARISON:  None. FINDINGS: CTA NECK FINDINGS Aortic arch: Standard branching. Mild aortic arch atherosclerotic calcifications. Mild proximal left subclavian artery narrowing secondary to atheromatous plaque. Otherwise patent great vessel origins. Right carotid system: Patent. Minimal proximal ICA atherosclerotic calcifications without significant narrowing. Left carotid system: Patent. 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 Vertebral arteries: Mild left vertebral artery origin narrowing secondary to atheromatous disease. Otherwise patent and codominant. Skeleton: No acute finding.  Mild spondylosis. Other neck: No adenopathy.  No soft tissue mass.  Upper chest: Patchy and nodular bilateral upper lung opacities concerning for infectious/inflammatory foci. Review of the MIP images confirms the above findings CTA HEAD FINDINGS Anterior circulation: Minimal carotid siphon atherosclerotic calcifications. Patent ICAs. Patent ophthalmic artery origins. Right A1 segment hypoplasia. Patent ACAs. Patent right MCA. Left M2 segment occlusion. Posterior circulation: Patent V4 segments. Patent right PICA. Patent basilar and superior cerebellar arteries. Patent bilateral PCAs. Venous sinuses: As permitted by contrast timing, patent. Anatomic variants: Please see above. Review of the MIP images confirms the above findings IMPRESSION: CTA neck: 30-40% distal left CCA narrowing secondary to atheromatous plaque. Mild narrowing of the proximal left subclavian and left vertebral artery origin. Bilateral upper lung nodular/patchy opacities concerning for infectious/inflammatory foci. CTA head: Left M2 branch occlusion. No aneurysm or dissection. Code stroke imaging results were communicated on 11/24/2020 at 4:41 pm to provider Dr. ARory Percyvia secure text paging. Electronically Signed   By: CPrimitivo GauzeM.D.   On: 11/24/2020 16:41

## 2020-12-04 DIAGNOSIS — I10 Essential (primary) hypertension: Secondary | ICD-10-CM | POA: Diagnosis not present

## 2020-12-04 DIAGNOSIS — I639 Cerebral infarction, unspecified: Secondary | ICD-10-CM | POA: Diagnosis not present

## 2020-12-04 LAB — PROCALCITONIN: Procalcitonin: 0.12 ng/mL

## 2020-12-04 LAB — COMPREHENSIVE METABOLIC PANEL
ALT: 20 U/L (ref 0–44)
AST: 16 U/L (ref 15–41)
Albumin: 2.8 g/dL — ABNORMAL LOW (ref 3.5–5.0)
Alkaline Phosphatase: 87 U/L (ref 38–126)
Anion gap: 13 (ref 5–15)
BUN: 15 mg/dL (ref 8–23)
CO2: 20 mmol/L — ABNORMAL LOW (ref 22–32)
Calcium: 8.8 mg/dL — ABNORMAL LOW (ref 8.9–10.3)
Chloride: 103 mmol/L (ref 98–111)
Creatinine, Ser: 0.69 mg/dL (ref 0.44–1.00)
GFR, Estimated: >60 mL/min (ref >60)
Glucose, Bld: 120 mg/dL — ABNORMAL HIGH (ref 70–99)
Potassium: 3.5 mmol/L (ref 3.5–5.1)
Sodium: 136 mmol/L (ref 135–145)
Total Bilirubin: 0.7 mg/dL (ref 0.3–1.2)
Total Protein: 7.6 g/dL (ref 6.5–8.1)

## 2020-12-04 LAB — CBC WITH DIFFERENTIAL/PLATELET
Abs Immature Granulocytes: 0.46 10*3/uL — ABNORMAL HIGH (ref 0.00–0.07)
Basophils Absolute: 0.1 10*3/uL (ref 0.0–0.1)
Basophils Relative: 1 %
Eosinophils Absolute: 0.2 10*3/uL (ref 0.0–0.5)
Eosinophils Relative: 2 %
HCT: 36.8 % (ref 36.0–46.0)
Hemoglobin: 11.4 g/dL — ABNORMAL LOW (ref 12.0–15.0)
Immature Granulocytes: 4 %
Lymphocytes Relative: 19 %
Lymphs Abs: 2.2 10*3/uL (ref 0.7–4.0)
MCH: 24.7 pg — ABNORMAL LOW (ref 26.0–34.0)
MCHC: 31 g/dL (ref 30.0–36.0)
MCV: 79.8 fL — ABNORMAL LOW (ref 80.0–100.0)
Monocytes Absolute: 0.6 10*3/uL (ref 0.1–1.0)
Monocytes Relative: 5 %
Neutro Abs: 8.4 10*3/uL — ABNORMAL HIGH (ref 1.7–7.7)
Neutrophils Relative %: 69 %
Platelets: 482 10*3/uL — ABNORMAL HIGH (ref 150–400)
RBC: 4.61 MIL/uL (ref 3.87–5.11)
RDW: 17.4 % — ABNORMAL HIGH (ref 11.5–15.5)
WBC: 12 10*3/uL — ABNORMAL HIGH (ref 4.0–10.5)
nRBC: 0 % (ref 0.0–0.2)

## 2020-12-04 LAB — GLUCOSE, CAPILLARY
Glucose-Capillary: 92 mg/dL (ref 70–99)
Glucose-Capillary: 151 mg/dL — ABNORMAL HIGH (ref 70–99)
Glucose-Capillary: 244 mg/dL — ABNORMAL HIGH (ref 70–99)
Glucose-Capillary: 176 mg/dL — ABNORMAL HIGH (ref 70–99)

## 2020-12-04 LAB — C-REACTIVE PROTEIN: CRP: 1.6 mg/dL — ABNORMAL HIGH (ref <1.0)

## 2020-12-04 LAB — MAGNESIUM: Magnesium: 2.3 mg/dL (ref 1.7–2.4)

## 2020-12-04 LAB — BRAIN NATRIURETIC PEPTIDE: B Natriuretic Peptide: 174 pg/mL — ABNORMAL HIGH (ref 0.0–100.0)

## 2020-12-04 MED ORDER — HALOPERIDOL LACTATE 5 MG/ML IJ SOLN
1.0000 mg | Freq: Four times a day (QID) | INTRAMUSCULAR | Status: DC | PRN
  Administered 2020-12-04 – 2020-12-05 (×3): 1 mg via INTRAVENOUS
  Filled 2020-12-04 (×3): qty 1

## 2020-12-04 NOTE — Progress Notes (Signed)
Occupational Therapy Treatment Patient Details Name: Emily Lozano MRN: [id number] DOB: 1951-05-06 Today's Date: 12/04/2020    History of present illness 70 y.o. female presents with acute onset R weakness, aphasia, left gaze. CTA demonstrates L M2 occlusion. Pt admitted and underwent thrombectomy on 11/24/2020 (unsuccessful). PMH: significant for hypertension, hyperlipidemia, type 2 diabetes mellitus, depression, GERD, melanoma, sleep apnea, and recent exposure to COVID-19 with a negative test at her PCP yesterday but diagnosed with acute bronchitis.   OT comments  Entered room with pt's legs off bed and pt moaning. Pt assisted with repositioning back onto bed. Pt able to roll in bed using rail with facilitation. Support given to RLE for pt to bridge and pull self up in bed using strong LUE. Attmepted to leave in chair position however pt shaking head no. Pt will benefit from rehab at SNF. Will further assess R UE and potential use of K tape to help support shoulder.   Follow Up Recommendations  SNF;Supervision/Assistance - 24 hour (per SW note)    Equipment Recommendations  Other (comment);3 in 1 bedside commode;Wheelchair (measurements OT);Wheelchair cushion (measurements OT)    Recommendations for Other Services      Precautions / Restrictions Precautions Precautions: Fall Precaution Comments: COVID postive       Mobility Bed Mobility Overal bed mobility: Needs Assistance             General bed mobility comments: Pt able to pull  on rail using LUE to reposition self in bed. RLE placed into bridge position and pt able to push through leg to help reposition in bed. Attmepted to place bed in chair position however pt shaking head "no"    Transfers                      Balance                                           ADL either performed or assessed with clinical judgement   ADL   Eating/Feeding: NPO   Grooming: Moderate  assistance Grooming Details (indicate cue type and reason): wiping face/attempting oral care                                     Vision       Perception     Praxis      Cognition Arousal/Alertness: Awake/alert Behavior During Therapy: Restless Overall Cognitive Status: Difficult to assess                     Current Attention Level: Sustained   Following Commands: Follows one step commands with increased time     Problem Solving: Slow processing;Difficulty sequencing          Exercises     Shoulder Instructions       General Comments      Pertinent Vitals/ Pain       Pain Assessment: Faces Faces Pain Scale: Hurts little more Pain Location: generalized Pain Descriptors / Indicators: Grimacing;Moaning Pain Intervention(s): Limited activity within patient's tolerance  Home Living  Prior Functioning/Environment              Frequency  Min 2X/week        Progress Toward Goals  OT Goals(current goals can now be found in the care plan section)  Progress towards OT goals: Progressing toward goals  Acute Rehab OT Goals Patient Stated Goal: Pt unstable to state OT Goal Formulation: Patient unable to participate in goal setting Time For Goal Achievement: 12/10/20 Potential to Achieve Goals: Good ADL Goals Pt Will Perform Grooming: with mod assist;sitting Pt Will Perform Upper Body Dressing: with mod assist;sitting Pt Will Transfer to Toilet: with mod assist;ambulating;bedside commode;with +2 assist Additional ADL Goal #1: Pt will tolerate sitting at EOB for 10 minutes with Mod A in preparation for ADLs Additional ADL Goal #2: Pt will follow one step commands during ADLs with MIn cues  Plan Discharge plan remains appropriate    Co-evaluation                 AM-PAC OT "6 Clicks" Daily Activity     Outcome Measure   Help from another person eating meals?:  Total Help from another person taking care of personal grooming?: A Lot Help from another person toileting, which includes using toliet, bedpan, or urinal?: Total Help from another person bathing (including washing, rinsing, drying)?: A Lot Help from another person to put on and taking off regular upper body clothing?: A Lot Help from another person to put on and taking off regular lower body clothing?: A Lot 6 Click Score: 10    End of Session    OT Visit Diagnosis: Unsteadiness on feet (R26.81);Other abnormalities of gait and mobility (R26.89);Muscle weakness (generalized) (M62.81);Pain;Hemiplegia and hemiparesis Hemiplegia - Right/Left: Right Hemiplegia - dominant/non-dominant: Dominant Hemiplegia - caused by: Cerebral infarction Pain - part of body:  (generalized discomfort)   Activity Tolerance Patient tolerated treatment well   Patient Left in bed;with call bell/phone within reach;with bed alarm set   Nurse Communication Mobility status (Pt incontinent of BM)        Time: 6962-9528 OT Time Calculation (min): 12 min  Charges: OT General Charges $OT Visit: 1 Visit OT Treatments $Self Care/Home Management : 8-22 mins  Maurie Boettcher, OT/L   Acute OT Clinical Specialist [place] Pager [phone] Office [phone]    [facility] 12/04/2020, 6:33 PM

## 2020-12-04 NOTE — Consult Note (Signed)
Physical Medicine and Rehabilitation Consult Reason for Consult: Right side weakness with aphasia Referring Physician: [facility]   HPI: Emily Lozano is a 70 y.o. right-handed female with history of hypertension, hyperlipidemia, type 2 diabetes mellitus and recent exposure to COVID-19 with negative test.  Presented 11/24/2020 with right side weakness left gaze preference and aphasia of acute onset.  Admission chemistries SARS coronavirus positive, glucose 285, hemoglobin 11.7, hemoglobin A1c 10.4, lactic acid 2.4.  Cranial CT scan showed acute left insular cortex.  Patient did not receive TPA.  CT angiogram of head and neck showed 30 to 40% distal left CCA narrowing secondary to Atheromatous plaque.  Left M2 branch occlusion.  No aneurysm or dissection.  She underwent successful revascularization per interventional radiology.  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  Echocardiogram with ejection fraction 55 to 10% grade 1 diastolic dysfunction no regional wall motion abnormalities.  Hospital course complicated by PAF with RVR.  Venous Doppler study showed acute DVT involving the right peroneal vein currently maintained on Eliquis for CVA prophylaxis as well as DVT.  Dysphagia #1 honey thick liquid diet.  In regards to patient COVID-19 pneumonia she did require intubation for short time completed course of remdesivir as well as tapering of Decadron.  Therapy evaluations completed due to patient's right side weakness and aphasia recommendations for physical medicine rehab consult. She appears agitated and is unable to follow commands.    Review of Systems  Unable to perform ROS: Language   Past Medical History:  Diagnosis Date  . Anemia   . Arthritis   . 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   . Depression   . Diabetes mellitus without complication ([place])   . Hypertension   . Melanoma ([place]) 2014    resected from Left forearm.    Past Surgical History:  Procedure Laterality Date  . ABDOMINAL HYSTERECTOMY    . BREAST LUMPECTOMY    . CARPAL TUNNEL RELEASE    . ESOPHAGEAL MANOMETRY N/A 12/05/2015   Procedure: ESOPHAGEAL MANOMETRY (EM);  Surgeon: Josefine Class, MD;  Location: [facility] ENDOSCOPY;  Service: Endoscopy;  Laterality: N/A;  . ESOPHAGOGASTRODUODENOSCOPY (EGD) WITH PROPOFOL N/A 11/26/2015   Procedure: ESOPHAGOGASTRODUODENOSCOPY (EGD) WITH PROPOFOL;  Surgeon: Lollie Sails, MD;  Location: [facility] ENDOSCOPY;  Service: Endoscopy;  Laterality: N/A;  . ESOPHAGOGASTRODUODENOSCOPY (EGD) WITH PROPOFOL N/A 04/30/2017   Procedure: ESOPHAGOGASTRODUODENOSCOPY (EGD) WITH PROPOFOL;  Surgeon: Lollie Sails, MD;  Location: [facility] ENDOSCOPY;  Service: Endoscopy;  Laterality: N/A;  . EXCISION MELANOMA WITH SENTINEL LYMPH NODE BIOPSY    . IR CT HEAD LTD  11/24/2020  . IR CT HEAD LTD  11/24/2020  . IR PERCUTANEOUS ART THROMBECTOMY/INFUSION INTRACRANIAL INC DIAG ANGIO  11/24/2020  . IR US GUIDE VASC ACCESS RIGHT  11/24/2020  . RADIOLOGY WITH ANESTHESIA N/A 11/24/2020   Procedure: IR WITH ANESTHESIA;  Surgeon: Radiologist, Medication, MD;  Location: [facility];  Service: Radiology;  Laterality: N/A;   History reviewed. No pertinent family history. Social History:  reports that she has quit smoking. She has never used smokeless tobacco. She reports that she does not drink alcohol and does not use drugs. Allergies:  Allergies  Allergen Reactions  . Ativan [Lorazepam]   . Morphine And Related Nausea And Vomiting   Medications Prior to Admission  Medication Sig Dispense Refill  . amLODipine (NORVASC) 5 MG tablet Take 5 mg by mouth daily.    Marland Kitchen  aspirin EC 81 MG tablet Take 81 mg by mouth daily.    Marland Kitchen buPROPion (WELLBUTRIN XL) 300 MG 24 hr tablet Take 300 mg by mouth daily.    Marland Kitchen Dextromethorphan-guaiFENesin (ROBITUSSIN COUGH+CHEST CONG DM) 20-200 MG/20ML LIQD Take 20 mLs by mouth 2 (two) times daily as needed  (cough/congestion).    Marland Kitchen dicyclomine (BENTYL) 10 MG capsule Take 10 mg by mouth 3 (three) times daily as needed (diarrhea).    Marland Kitchen DM-APAP-CPM (CORICIDIN HBP PO) Take 1 tablet by mouth 2 (two) times daily as needed (cough congestion).    Marland Kitchen glimepiride (AMARYL) 4 MG tablet Take 4 mg by mouth See admin instructions. Take one tablet (4 mg) by mouth every morning, take an additional tablet (4 mg) for CBG 250-300    . hydroxychloroquine (PLAQUENIL) 200 MG tablet Take 200-400 mg by mouth See admin instructions. Take 2 tablets (400 mg) by mouth 1st day, then take 1 tablet (200 mg) 2nd day, then repeat alternating days    . losartan-hydrochlorothiazide (HYZAAR) 100-12.5 MG tablet Take 1 tablet by mouth daily.    . metFORMIN (GLUCOPHAGE) 500 MG tablet Take 500 mg by mouth 2 (two) times daily with a meal.    . metoprolol succinate (TOPROL-XL) 100 MG 24 hr tablet Take 100 mg by mouth daily. Take with or immediately following a meal.    . naproxen sodium (ALEVE) 220 MG tablet Take 220 mg by mouth 2 (two) times daily as needed (pain/headache).    . pantoprazole (PROTONIX) 40 MG tablet Take 40 mg by mouth 2 (two) times daily.    . rosuvastatin (CRESTOR) 10 MG tablet Take 10 mg by mouth daily.    . Ascorbic Acid (VITAMIN C) 1000 MG tablet Take 1,000 mg by mouth daily.    Marland Kitchen azithromycin (ZITHROMAX) 250 MG tablet Take 250-500 mg by mouth See admin instructions. Take 2 tablets (500 mg) by mouth 1st day, then take 1 tablet (250 mg) daily on days 2-5    . benzonatate (TESSALON) 200 MG capsule Take 200 mg by mouth 3 (three) times daily as needed for cough.    . predniSONE (DELTASONE) 20 MG tablet Take 20 mg by mouth daily.    . Zinc 50 MG TABS Take 50 mg by mouth daily.      Home: Home Living Family/patient expects to be discharged to:: Private residence Living Arrangements: Children Additional Comments: Pt unable to provide due to aphasia and intubation. RN reports pt has son who visited earlier  Functional  History: Prior Function Comments: Pt unable to provide information about PLOF Functional Status:  Mobility: Bed Mobility Overal bed mobility: Needs Assistance Bed Mobility: Rolling,Supine to Sit,Sit to Supine Rolling: Mod assist,Max assist Supine to sit: Mod assist Sit to supine: Max assist General bed mobility comments: Mod A to bring BLEs towards EOB and then elevate trunk. cuing to come to and stay in sidelying however unable to maintain while LE managed into bed, and ultimately requires maxA for returning to supine, once there able to assist with R LE management as pt bridged pelvis to center in bed. Transfers Overall transfer level: Needs assistance Equipment used: 2 person hand held assist Transfers: Sit to/from Stand Sit to Stand: Max assist,+2 physical assistance,+2 safety/equipment General transfer comment: did not attempt due to decreased assist available Ambulation/Gait General Gait Details: NT    ADL: ADL Overall ADL's : Needs assistance/impaired Eating/Feeding: NPO Grooming: Maximal assistance Lower Body Dressing: Maximal assistance,Bed level Lower Body Dressing Details (indicate cue type and  reason): Max A to bring ankles to knees and maintain figure four position. Donning socks over toes and then cuing pt to compelte task. Pt requiring hns over hadn to reachwith LUE and pull sock over heel Toilet Transfer: Maximal assistance,+2 for physical assistance (sit<>stand at EOB and then shifting hips) Toileting- Clothing Manipulation and Hygiene: Total assistance,Bed level Toileting - Clothing Manipulation Details (indicate cue type and reason): Total A for peri care at bed level Functional mobility during ADLs: Maximal assistance,+2 for physical assistance (sit<>stand at EOB and then shifting hips) General ADL Comments: Pt performing bed mobility to then sit at EOB with Mod-Max A for support  Cognition: Cognition Overall Cognitive Status: Impaired/Different from  baseline Arousal/Alertness: Awake/alert Orientation Level: Oriented to person Attention: Focused,Sustained Focused Attention: Appears intact Sustained Attention: Appears intact Cognition Arousal/Alertness: Awake/alert Behavior During Therapy: WFL for tasks assessed/performed Overall Cognitive Status: Impaired/Different from baseline Area of Impairment: Attention,Following commands,Awareness,Problem solving Current Attention Level: Sustained Following Commands: Follows one step commands with increased time Awareness: Intellectual Problem Solving: Slow processing,Requires verbal cues,Requires tactile cues General Comments: Follows > 50% of cues today, prefers to have eyes closed, possible double vision, requires increased time for processing Difficult to assess due to: Impaired communication  Blood pressure (!) 148/74, pulse 66, temperature 98.5 F (36.9 C), temperature source Oral, resp. rate 20, height 5\' 4"  (1.626 m), weight 69.4 kg, SpO2 94 %. Physical Exam Gen: no distress, normal appearing HEENT: oral mucosa pink and moist, NCAT Cardio: Reg rate Chest: normal effort, normal rate of breathing Abd: soft, non-distended Ext: Bilateral hand mitts in place Psych: pleasant, normal affect Skin: intact Neurological:     Comments: Patient is alert in no acute distress.  Globally aphasic.  She did not follow commands.    Results for orders placed or performed during the hospital encounter of 11/24/20 (from the past 24 hour(s))  Glucose, capillary     Status: Abnormal   Collection Time: 12/03/20 11:21 AM  Result Value Ref Range   Glucose-Capillary 126 (H) 70 - 99 mg/dL  Glucose, capillary     Status: Abnormal   Collection Time: 12/03/20  5:09 PM  Result Value Ref Range   Glucose-Capillary 183 (H) 70 - 99 mg/dL  Glucose, capillary     Status: Abnormal   Collection Time: 12/03/20  8:26 PM  Result Value Ref Range   Glucose-Capillary 209 (H) 70 - 99 mg/dL  Brain natriuretic peptide      Status: Abnormal   Collection Time: 12/04/20  7:24 AM  Result Value Ref Range   B Natriuretic Peptide 174.0 (H) 0.0 - 100.0 pg/mL  Magnesium     Status: None   Collection Time: 12/04/20  7:24 AM  Result Value Ref Range   Magnesium 2.3 1.7 - 2.4 mg/dL  C-reactive protein     Status: Abnormal   Collection Time: 12/04/20  7:24 AM  Result Value Ref Range   CRP 1.6 (H) <1.0 mg/dL  Comprehensive metabolic panel     Status: Abnormal   Collection Time: 12/04/20  7:24 AM  Result Value Ref Range   Sodium 136 135 - 145 mmol/L   Potassium 3.5 3.5 - 5.1 mmol/L   Chloride 103 98 - 111 mmol/L   CO2 20 (L) 22 - 32 mmol/L   Glucose, Bld 120 (H) 70 - 99 mg/dL   BUN 15 8 - 23 mg/dL   Creatinine, Ser 0.69 0.44 - 1.00 mg/dL   Calcium 8.8 (L) 8.9 - 10.3 mg/dL   Total  Protein 7.6 6.5 - 8.1 g/dL   Albumin 2.8 (L) 3.5 - 5.0 g/dL   AST 16 15 - 41 U/L   ALT 20 0 - 44 U/L   Alkaline Phosphatase 87 38 - 126 U/L   Total Bilirubin 0.7 0.3 - 1.2 mg/dL   GFR, Estimated >60 >60 mL/min   Anion gap 13 5 - 15  CBC with Differential/Platelet     Status: Abnormal   Collection Time: 12/04/20  7:24 AM  Result Value Ref Range   WBC 12.0 (H) 4.0 - 10.5 K/uL   RBC 4.61 3.87 - 5.11 MIL/uL   Hemoglobin 11.4 (L) 12.0 - 15.0 g/dL   HCT 36.8 36.0 - 46.0 %   MCV 79.8 (L) 80.0 - 100.0 fL   MCH 24.7 (L) 26.0 - 34.0 pg   MCHC 31.0 30.0 - 36.0 g/dL   RDW 17.4 (H) 11.5 - 15.5 %   Platelets 482 (H) 150 - 400 K/uL   nRBC 0.0 0.0 - 0.2 %   Neutrophils Relative % 69 %   Neutro Abs 8.4 (H) 1.7 - 7.7 K/uL   Lymphocytes Relative 19 %   Lymphs Abs 2.2 0.7 - 4.0 K/uL   Monocytes Relative 5 %   Monocytes Absolute 0.6 0.1 - 1.0 K/uL   Eosinophils Relative 2 %   Eosinophils Absolute 0.2 0.0 - 0.5 K/uL   Basophils Relative 1 %   Basophils Absolute 0.1 0.0 - 0.1 K/uL   Immature Granulocytes 4 %   Abs Immature Granulocytes 0.46 (H) 0.00 - 0.07 K/uL  Procalcitonin     Status: None   Collection Time: 12/04/20  7:24 AM  Result  Value Ref Range   Procalcitonin 0.12 ng/mL  Glucose, capillary     Status: None   Collection Time: 12/04/20  7:42 AM  Result Value Ref Range   Glucose-Capillary 92 70 - 99 mg/dL   DG Chest Port 1 View  Result Date: 12/03/2020 CLINICAL DATA:  Shortness of breath.  COVID-19 positive EXAM: PORTABLE CHEST 1 VIEW COMPARISON:  December 02, 2020 FINDINGS: Patchy airspace opacity is noted in the lung bases, more on the right than on the left. Lungs elsewhere clear. Heart is upper normal in size with pulmonary vascularity normal. No adenopathy. There is aortic atherosclerosis. There is calcification in each carotid artery. No bone lesions. IMPRESSION: Persistent airspace opacity bases, more on the right than the left, likely due to atypical organism pneumonia. No new opacity evident. Stable cardiac silhouette. Aortic Atherosclerosis (ICD10-I70.0). There are foci of carotid artery calcification bilaterally. Electronically Signed   By: Lowella Grip III M.D.   On: 12/03/2020 08:41    Assessment and Plan: 1) L M2 occlusion: Patient will require intensive rehab before d/c home 2) Fatigue: She currently does not demonstrate the tolerance for CIR. We will continue to monitor as patient improves. 3) Global aphasia: she will require intensive SLP 4) COVID-19 positive: she will need to no longer require precuations before she can be admitted to CIR  Thank you for this consult. Admission coordinator to follow.   I have personally performed a face to face diagnostic evaluation, including, but not limited to relevant history and physical exam findings, of this patient and developed relevant assessment and plan.  Additionally, I have reviewed and concur with the physician assistant's documentation above.  Leeroy Cha, MD  Lavon Paganini Dietrich, PA-C 12/04/2020

## 2020-12-04 NOTE — Progress Notes (Signed)
PROGRESS NOTE                                                                                                                                                                                                             Patient Demographics:    Emily Lozano, is a 70 y.o. female, DOB - [birth year], ZYS:[id number]  Outpatient Primary MD for the patient is Marinda Elk, MD    LOS - 10  Admit date - 11/24/2020    Chief Complaint  Patient presents with  . Code Stroke       Brief Narrative (HPI from H&P) - [name] is a 70 y.o. female Hx of multiple cerebrovascular risk factors including hypertension, atrial fibrillation, carotid artery disease, dyslipidemia, DM type II who presented with sudden onset of left gaze preference, right facial droop, hemiparesis and expressive aphasia still with left hemispheric dysfunction. CTA head and neck with left M2 occlusion and though outside the window for IV TPA was a candidate for intervention now s/p unsuccessful revascularization, though there was some distal movement of the clot from M2 to M3; TIKI3, also found to have COVID-19 pneumonia, was seen by critical care required intubation to protect her airway, case complicated by development of right lower extremity DVT requiring heparin drip.  She was transferred to my care on 12/02/2020 on day 8 of her hospital stay.   Subjective:   Patient in bed, continues to have expressive and receptive aphasia, unable to follow commands reliably, neglecting her right side but overall in no distress, does not move right side much.   Assessment  & Plan :     1.  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  In a patient with underlying A. fib, hypertension, dyslipidemia and DM type II.  She has been under neuro service, has had thorough stroke work-up, underwent unsuccessful  revascularization attempt by IR, currently undergoing PT OT and speech therapy, most likely will require placement.  Still has dense right-sided weakness and expressive aphasia along with some dysphagia, currently on aspirin, statin along with Eliquis for anticoagulation.  Will require placement to CIR versus SNF.  2.  Dyslipidemia.  LDL was 70 on rosuvastatin 10 mg.  Continue  3.  Paroxysmal A. fib RVR.  Mali vas 2 score of greater than  5, continue beta-blocker along with heparin drip >> Eliquis.  Echo shows a EF of 55 to 60% no acute findings  4.  Essential hypertension.  Goal of SBP under 160, currently on beta-blocker HCTZ along with lisinopril 5 mg, will add 5 mg of Norvasc on 12/02/2020 on day 8 of her CVA.  5.  Right lower extremity DVT.  Continue Eliquis.  6.  History of smoking.  Counseled to quit.  7.  COVID-19 pneumonia with acute hypoxic respiratory failure.  Required intubation and ICU stay, has completed Remdesivir course, tapering down Decadron.  She had some post intubation laryngeal edema and stridor which is much improved, currently on room air.  8.  DM type II.  On Levemir along with sliding scale, continue, dose adjusted as steroids being tapered.  Lab Results  Component Value Date   HGBA1C 10.6 (H) 11/25/2020   Lab Results  Component Value Date   CHOL 139 11/25/2020   HDL 15 (L) 11/25/2020   LDLCALC UNABLE TO CALCULATE IF TRIGLYCERIDE OVER 400 mg/dL 11/25/2020   LDLDIRECT 70.6 11/28/2020   TRIG 212 (H) 12/01/2020   CHOLHDL 9.3 11/25/2020   CBG (last 3)  Recent Labs    12/03/20 1709 12/03/20 2026 12/04/20 0742  GLUCAP 183* 209* 92    Encouraged the patient to sit up in chair in the daytime use I-S and flutter valve for pulmonary toiletry and then prone in bed when at night.  Will advance activity and titrate down oxygen as possible.         Condition - Extremely Guarded  Family Communication :    Rodena Goldmann [phone] on 12/02/20 at 9:28 AM x 2 phone  hung up, 12/03/20 at 10:28 AM no response. Son Erlene Quan [phone] on 12/02/2020 at 9:29, 12/03/2020 at 10:29 AM message left  Code Status : Full  Consults  :  Neuro, PCCM  PUD Prophylaxis :  PPI   Procedures  :     Intubated and extubated in ICU.  Unsuccessful attempt at left M2 occlusion removal by IR      Disposition Plan  :    Status is: Inpatient  Remains inpatient appropriate because:IV treatments appropriate due to intensity of illness or inability to take PO   Dispo: The patient is from: Home              Anticipated d/c is to: SNF              Patient currently is not medically stable to d/c.   Difficult to place patient No  DVT Prophylaxis  :  Heparin >> Eliquis  Lab Results  Component Value Date   PLT 482 (H) 12/04/2020    Diet :  Diet Order            DIET - DYS 1 Room service appropriate? No; Fluid consistency: Honey Thick  Diet effective now                  Inpatient Medications  Scheduled Meds: . amLODipine  10 mg Oral Daily  . apixaban  10 mg Oral BID   Followed by  . [START ON 12/09/2020] apixaban  5 mg Oral BID  . chlorhexidine  15 mL Mouth Rinse BID  . insulin aspart  0-15 Units Subcutaneous TID WC  . insulin detemir  12 Units Subcutaneous Daily  . mouth rinse  15 mL Mouth Rinse q12n4p  . metoprolol tartrate  50 mg Oral BID  . mometasone-formoterol  2 puff  Inhalation BID  . pantoprazole  40 mg Oral QHS  . potassium chloride  20 mEq Oral Daily  . rosuvastatin  10 mg Oral Daily  . sodium chloride flush  3 mL Intravenous Once   Continuous Infusions:  PRN Meds:.acetaminophen **OR** acetaminophen (TYLENOL) oral liquid 160 mg/5 mL **OR** acetaminophen, hydrALAZINE, Resource ThickenUp Clear, senna-docusate  Antibiotics  :    Anti-infectives (From admission, onward)   Start     Dose/Rate Route Frequency Ordered Stop   11/25/20 1000  remdesivir 100 mg in sodium chloride 0.9 % 100 mL IVPB       "Followed by" Linked Group Details   100  mg 200 mL/hr over 30 Minutes Intravenous Daily 11/24/20 2119 11/28/20 1119   11/24/20 2215  remdesivir 200 mg in sodium chloride 0.9% 250 mL IVPB       "Followed by" Linked Group Details   200 mg 580 mL/hr over 30 Minutes Intravenous Once 11/24/20 2119 11/25/20 0932       Time Spent in minutes  30   Lala Lund M.D on 12/04/2020 at 11:09 AM  To page go to [email or web address]   [facility] -  Office  [phone]    See all Orders from today for further details    Objective:   Vitals:   12/04/20 0436 12/04/20 0454 12/04/20 0747 12/04/20 1000  BP:  (!) 155/66 (!) 154/80 (!) 148/74  Pulse:  61 (!) 54 66  Resp:  20    Temp:  98.5 F (36.9 C)    TempSrc:  Oral    SpO2:  96% 95% 94%  Weight: 69.4 kg     Height:        Wt Readings from Last 3 Encounters:  12/04/20 69.4 kg  05/25/18 74.4 kg  12/10/17 76.7 kg     Intake/Output Summary (Last 24 hours) at 12/04/2020 1109 Last data filed at 12/04/2020 0900 Gross per 24 hour  Intake 1106.91 ml  Output 850 ml  Net 256.91 ml     Physical Exam  Awake, dense right-sided hemiparesis, expressive aphasia, right-sided neglect [place].AT,PERRAL Supple Neck,No JVD, No cervical lymphadenopathy appriciated.  Symmetrical Chest wall movement, Good air movement bilaterally, CTAB RRR,No Gallops, Rubs or new Murmurs, No Parasternal Heave +ve B.Sounds, Abd Soft, No tenderness, No organomegaly appriciated, No rebound - guarding or rigidity. No Cyanosis, Clubbing or edema, No new Rash or bruise    Data Review:    CBC Recent Labs  Lab 11/30/20 0311 12/01/20 0453 12/02/20 0432 12/03/20 0118 12/04/20 0724  WBC 8.7 16.5* 17.2* 16.4* 12.0*  HGB 11.2* 12.3 10.8* 11.3* 11.4*  HCT 35.1* 37.6 32.5* 36.5 36.8  PLT 446* 319 412* 437* 482*  MCV 77.7* 77.4* 76.8* 78.3* 79.8*  MCH 24.8* 25.3* 25.5* 24.2* 24.7*  MCHC 31.9 32.7 33.2 31.0 31.0  RDW 16.4* 16.8* 17.1* 17.0* 17.4*  LYMPHSABS 1.5 1.4 1.6 1.3 2.2  MONOABS 0.5 1.0 0.7 0.7 0.6   EOSABS 0.1 0.0 0.1 0.1 0.2  BASOSABS 0.0 0.0 0.0 0.1 0.1    Recent Labs  Lab 11/28/20 0215 11/29/20 0556 11/30/20 0311 12/01/20 0453 12/02/20 0432 12/03/20 0118 12/04/20 0724  NA 144   < > 136 137 136 134* 136  K 3.3*   < > 3.6 3.4* 3.7 3.6 3.5  CL 110   < > 104 102 104 102 103  CO2 23   < > 24 21* 20* 22 20*  GLUCOSE 283*   < > 135* 129* 138* 162* 120*  BUN  18   < > 20 19 13 11 15   CREATININE 0.68   < > 0.61 0.72 0.69 0.70 0.69  CALCIUM 8.5*   < > 8.2* 8.8* 8.5* 8.5* 8.8*  AST  --   --   --   --   --  16 16  ALT  --   --   --   --   --  18 20  ALKPHOS  --   --   --   --   --  82 87  BILITOT  --   --   --   --   --  0.9 0.7  ALBUMIN  --   --   --   --   --  2.5* 2.8*  MG 2.1  --   --   --   --  2.3 2.3  CRP  --   --   --   --   --  5.9* 1.6*  PROCALCITON  --   --   --   --   --  <0.10  --   BNP  --   --   --   --   --  386.1* 174.0*   < > = values in this interval not displayed.    ------------------------------------------------------------------------------------------------------------------ No results for input(s): CHOL, HDL, LDLCALC, TRIG, CHOLHDL, LDLDIRECT in the last 72 hours.  Lab Results  Component Value Date   HGBA1C 10.6 (H) 11/25/2020   ------------------------------------------------------------------------------------------------------------------ No results for input(s): TSH, T4TOTAL, T3FREE, THYROIDAB in the last 72 hours.  Invalid input(s): FREET3  Cardiac Enzymes No results for input(s): CKMB, TROPONINI, MYOGLOBIN in the last 168 hours.  Invalid input(s): CK ------------------------------------------------------------------------------------------------------------------    Component Value Date/Time   BNP 174.0 (H) 12/04/2020 0724    Micro Results Recent Results (from the past 240 hour(s))  Resp Panel by RT-PCR (Flu A&B, Covid) Nasopharyngeal Swab     Status: Abnormal   Collection Time: 11/24/20  3:58 PM   Specimen: Nasopharyngeal Swab;  Nasopharyngeal(NP) swabs in vial transport medium  Result Value Ref Range Status   SARS Coronavirus 2 by RT PCR POSITIVE (A) NEGATIVE Final    Comment: RESULT CALLED TO, READ BACK BY AND VERIFIED WITH: RN D WOHL AT 1733 11/24/2020 BY L BENFIELD (NOTE) SARS-CoV-2 target nucleic acids are DETECTED.  The SARS-CoV-2 RNA is generally detectable in upper respiratory specimens during the acute phase of infection. Positive results are indicative of the presence of the identified virus, but do not rule out bacterial infection or co-infection with other pathogens not detected by the test. Clinical correlation with patient history and other diagnostic information is necessary to determine patient infection status. The expected result is Negative.  Fact Sheet for Patients: EntrepreneurPulse.com.au  Fact Sheet for Healthcare Providers: IncredibleEmployment.be  This test is not yet approved or cleared by the [place] FDA and  has been authorized for detection and/or diagnosis of SARS-CoV-2 by FDA under an Emergency Use Authorization (EUA).  This EUA will remain in effect (meaning this tes t can be used) for the duration of  the COVID-19 declaration under Section 564(b)(1) of the Act, 21 U.S.C. section 360bbb-3(b)(1), unless the authorization is terminated or revoked sooner.     Influenza A by PCR NEGATIVE NEGATIVE Final   Influenza B by PCR NEGATIVE NEGATIVE Final    Comment: (NOTE) The Xpert Xpress SARS-CoV-2/FLU/RSV plus assay is intended as an aid in the diagnosis of influenza from Nasopharyngeal swab specimens and should not be used as a  sole basis for treatment. Nasal washings and aspirates are unacceptable for Xpert Xpress SARS-CoV-2/FLU/RSV testing.  Fact Sheet for Patients: EntrepreneurPulse.com.au  Fact Sheet for Healthcare Providers: IncredibleEmployment.be  This test is not yet approved or cleared by  the [place] FDA and has been authorized for detection and/or diagnosis of SARS-CoV-2 by FDA under an Emergency Use Authorization (EUA). This EUA will remain in effect (meaning this test can be used) for the duration of the COVID-19 declaration under Section 564(b)(1) of the Act, 21 U.S.C. section 360bbb-3(b)(1), unless the authorization is terminated or revoked.  Performed at [facility], [place] [address], [place], [place] [address]   MRSA PCR Screening     Status: None   Collection Time: 11/24/20  7:53 PM   Specimen: Nasal Mucosa; Nasopharyngeal  Result Value Ref Range Status   MRSA by PCR NEGATIVE NEGATIVE Final    Comment:        The GeneXpert MRSA Assay (FDA approved for NASAL specimens only), is one component of a comprehensive MRSA colonization surveillance program. It is not intended to diagnose MRSA infection nor to guide or monitor treatment for MRSA infections. Performed at [facility], [place] [address] [place], [place] [address]   Culture, blood (routine x 2)     Status: None   Collection Time: 11/25/20  7:16 PM   Specimen: BLOOD LEFT HAND  Result Value Ref Range Status   Specimen Description BLOOD LEFT HAND  Final   Special Requests   Final    BOTTLES DRAWN AEROBIC AND ANAEROBIC Blood Culture results may not be optimal due to an inadequate volume of blood received in culture bottles   Culture   Final    NO GROWTH 5 DAYS Performed at [facility], [place] [address] [place], [place] [address]    Report Status 11/30/2020 FINAL  Final  Culture, blood (routine x 2)     Status: None   Collection Time: 11/25/20  7:31 PM   Specimen: BLOOD RIGHT HAND  Result Value Ref Range Status   Specimen Description BLOOD RIGHT HAND  Final   Special Requests   Final    BOTTLES DRAWN AEROBIC AND ANAEROBIC Blood Culture results may not be optimal due to an inadequate volume of blood received in culture bottles   Culture   Final    NO GROWTH 5 DAYS Performed  at [facility], [place] [address], [place], [place] [address]    Report Status 11/30/2020 FINAL  Final    Radiology Reports CT HEAD WO CONTRAST  Result Date: 12/02/2020 CLINICAL DATA:  Stroke follow-up EXAM: CT HEAD WITHOUT CONTRAST TECHNIQUE: Contiguous axial images were obtained from the base of the skull through the vertex without intravenous contrast. COMPARISON:  Brain MRI 11/25/2020 FINDINGS: Brain: Large superior division left MCA territory infarct matching the area of diffusion abnormality on prior. Mild petechial hemorrhage is present and non progressed from MRI. No midline shift. Background of chronic small vessel ischemia in the cerebral white matter. No hydrocephalus or masslike finding. Vascular: Unchanged Skull: Normal. Negative for fracture or focal lesion. Sinuses/Orbits: Negative IMPRESSION: 1. Large recent left MCA territory infarct with mild petechial hemorrhage. No progression since brain MRI 11/25/2020. 2. Extensive chronic small vessel ischemia. Electronically Signed   By: Monte Fantasia M.D.   On: 12/02/2020 11:14   MR BRAIN WO CONTRAST  Result Date: 11/25/2020 CLINICAL DATA:  Acute stroke presentation 11/24/2020. Left insular infarction. EXAM: MRI HEAD WITHOUT CONTRAST TECHNIQUE: Multiplanar,  multiecho pulse sequences of the brain and surrounding structures were obtained without intravenous contrast. COMPARISON:  CT and interventional studies over the last day. FINDINGS: Brain: Diffusion imaging shows acute infarction in the deep insula and left frontoparietal cortical and subcortical brain. Mild swelling in the region. There are either minimal petechial blood products or there is susceptibility artifact related to thrombosed vessels. No frank hematoma. No mass effect or shift. Elsewhere, the brainstem and cerebellum are unremarkable. Both cerebral hemispheres show moderate to severe chronic small-vessel ischemic changes throughout the white matter. No hydrocephalus. No  extra-axial collection. Vascular: Major vessels at base of the brain show flow. Skull and upper cervical spine: Negative Sinuses/Orbits: Mucosal inflammatory changes of paranasal sinuses. Orbits negative. Other: None IMPRESSION: 1. Acute infarction in the left MCA territory affecting the deep insula and left frontoparietal cortical and subcortical brain. Minimal petechial blood products or susceptibility artifact related to thrombosed vessels. No frank hematoma. No mass effect or shift. 2. Moderate to severe chronic small-vessel ischemic changes elsewhere throughout the cerebral hemispheric white matter. Electronically Signed   By: Nelson Chimes M.D.   On: 11/25/2020 02:18   IR CT Head Ltd  Result Date: 11/27/2020 INDICATION: 70 year old female with past medical history significant for rheumatoid arthritis, diabetes mellitus, hypertension, anemia and melanoma presenting with acute onset of right hemiparesis, aphasia and left gaze preference. She was found to be covered positive today. Last known well at 10:30 a.m. on 11/24/2020; modified Rankin scale 0; NIHSS 7. No IV tPA given as she was outside the window. Head CT showed an acute infarct the in the left insula and CT angiogram showed intracranial atherosclerotic disease with a left M2/MCA occlusion. EXAM: Ultrasound-guided vascular access Diagnostic cerebral angiogram Mechanical thrombectomy Flat panel head CT COMPARISON:  CT/CT angiogram of the head and neck November 24, 2020. MEDICATIONS: Refer to anesthesia documentation. ANESTHESIA/SEDATION: The procedure was performed under general anesthesia CONTRAST:  For 75 mL of Omnipaque 240 mg/mL FLUOROSCOPY TIME:  Fluoroscopy Time: 24 minutes 54 seconds (1,520 mGy). COMPLICATIONS: SIR Level A - No therapy, no consequence. TECHNIQUE: Informed written consent was obtained from the patient's son after a thorough discussion of the procedural risks, benefits and alternatives. All questions were addressed. Maximal Sterile  Barrier Technique was utilized including caps, mask, sterile gowns, sterile gloves, sterile drape, hand hygiene and skin antiseptic. A timeout was performed prior to the initiation of the procedure. The right groin was prepped and draped in the usual sterile fashion. Using a micropuncture kit and the modified Seldinger technique, access was gained to the right common femoral artery and an 8 French sheath was placed. Real-time ultrasound guidance was utilized for vascular access including the acquisition of a permanent ultrasound image documenting patency of the accessed vessel. Under fluoroscopy, a Zoom 88 guide catheter was navigated over a 6 [place] Berenstein 2 catheter and a 0.035" Terumo Glidewire into the aortic arch. The catheter was placed into the left common carotid artery and then advanced into the left internal carotid artery. The inner catheter was removed. Frontal and lateral angiograms of the head were obtained. FINDINGS: 1. Occlusion of the mid left M2/MCA middle division branch. 2. Intracranial atherosclerotic disease with multiple luminal irregularities along the left MCA and left ACA vascular tree more significant at the A3 segment where there is moderate stenosis. 3. Patent right common femoral artery. PROCEDURE: Under biplane roadmap, a zoom 55 aspiration catheter was navigated over an Aristotle 18 microguidewire into the cavernous segment of the left ICA and then  advanced to the level of occlusion. The aspiration catheter was connected to a penumbra aspiration pump. The guiding catheter balloon was inflated. The aspiration catheter were removed under constant aspiration. Follow-up left ICA angiograms showed persistent occlusion which appear to have progressed more distally. Under biplane roadmap, a zoom 55 aspiration catheter was navigated over an Aristotle 18 microguidewire into the cavernous segment of the left ICA and then advanced to the level of occlusion. The aspiration catheter was  connected to a penumbra aspiration pump. The guiding catheter balloon was inflated. The aspiration catheter were removed under constant aspiration. Follow-up left ICA angiograms showed no significant change. Under biplane roadmap, a zoom 55 aspiration catheter was navigated over a phenom 21 microcatheter and a Aristotle 18 microguidewire into the cavernous segment of the right ICA. The microcatheter was then navigated over the wire into the left M3/MCA middle division branch. Then, a 4 x 40 mm solitaire stent retriever was deployed spanning the left M2 and proximal M3 segment. The device was allowed to intercalated with the clot for 4 minutes. The microcatheter was removed. The aspiration catheter was advanced to the level of occlusion and connected to a penumbra aspiration pump. The thrombectomy device and aspiration catheter were removed under constant aspiration. Follow-up left ICA angiogram showed significant change. Under biplane roadmap, a zoom 55 aspiration catheter was navigated over a phenom 21 microcatheter and a Aristotle 18 microguidewire into the cavernous segment of the right ICA. The microcatheter was then navigated over the wire into the left M3/MCA middle division branch. Then, a 4 x 40 mm solitaire stent retriever was deployed spanning the left M2 and proximal M3 segment. The device was allowed to intercalated with the clot for 4 minutes. The microcatheter was removed. The aspiration catheter was advanced to the level of occlusion and connected to a penumbra aspiration pump. The thrombectomy device and aspiration catheter were removed under constant aspiration. Follow-up angiogram showed persistent occlusion. Flat panel CT of the head was obtained and post processed in a separate workstation with concurrent attending physician supervision. Selected images were sent to PACS. Small contrast extravasation in the left sylvian fissure noted. Follow-up angiogram showed no evidence of active contrast  extravasation with persistent occlusion of a left M2 branch. Delay flat panel CT of the head was obtained and post processed in a separate workstation with concurrent attending physician supervision. Selected images were sent to PACS. Left sylvian contrast extravasation appears stable. Delayed left ICA angiograms confirmed no evidence of contrast extravasation. The catheter was subsequently withdrawn. Left common femoral artery angiograms were obtained in frontal and lateral views. The puncture is at the level of the common femoral artery which has adequate caliber for closure device. The sheath was exchanged for a Perclose ProGlide which was utilized for access closure. Immediate hemostasis was achieved. IMPRESSION: Unsuccessful attempted recanalization of a mid left M2/MCA middle division branch complicated by small amount contrast extravasation in the left sylvian fissure. No active extravasation seen at the end of the procedure. Left MCA status remains TICI2b, which is similar to presentation. PLAN: Patient is to remain intubated given unsuccessful recanalization and COVID positive test. She will be transferred to ICU for continued care. Electronically Signed   By: Pedro Earls M.D.   On: 11/27/2020 09:25   IR CT Head Ltd  Result Date: 11/27/2020 INDICATION: 70 year old female with past medical history significant for rheumatoid arthritis, diabetes mellitus, hypertension, anemia and melanoma presenting with acute onset of right hemiparesis, aphasia and left gaze  preference. She was found to be covered positive today. Last known well at 10:30 a.m. on 11/24/2020; modified Rankin scale 0; NIHSS 7. No IV tPA given as she was outside the window. Head CT showed an acute infarct the in the left insula and CT angiogram showed intracranial atherosclerotic disease with a left M2/MCA occlusion. EXAM: Ultrasound-guided vascular access Diagnostic cerebral angiogram Mechanical thrombectomy Flat panel head  CT COMPARISON:  CT/CT angiogram of the head and neck November 24, 2020. MEDICATIONS: Refer to anesthesia documentation. ANESTHESIA/SEDATION: The procedure was performed under general anesthesia CONTRAST:  For 75 mL of Omnipaque 240 mg/mL FLUOROSCOPY TIME:  Fluoroscopy Time: 24 minutes 54 seconds (1,520 mGy). COMPLICATIONS: SIR Level A - No therapy, no consequence. TECHNIQUE: Informed written consent was obtained from the patient's son after a thorough discussion of the procedural risks, benefits and alternatives. All questions were addressed. Maximal Sterile Barrier Technique was utilized including caps, mask, sterile gowns, sterile gloves, sterile drape, hand hygiene and skin antiseptic. A timeout was performed prior to the initiation of the procedure. The right groin was prepped and draped in the usual sterile fashion. Using a micropuncture kit and the modified Seldinger technique, access was gained to the right common femoral artery and an 8 French sheath was placed. Real-time ultrasound guidance was utilized for vascular access including the acquisition of a permanent ultrasound image documenting patency of the accessed vessel. Under fluoroscopy, a Zoom 88 guide catheter was navigated over a 6 [place] Berenstein 2 catheter and a 0.035" Terumo Glidewire into the aortic arch. The catheter was placed into the left common carotid artery and then advanced into the left internal carotid artery. The inner catheter was removed. Frontal and lateral angiograms of the head were obtained. FINDINGS: 1. Occlusion of the mid left M2/MCA middle division branch. 2. Intracranial atherosclerotic disease with multiple luminal irregularities along the left MCA and left ACA vascular tree more significant at the A3 segment where there is moderate stenosis. 3. Patent right common femoral artery. PROCEDURE: Under biplane roadmap, a zoom 55 aspiration catheter was navigated over an Aristotle 18 microguidewire into the cavernous segment of  the left ICA and then advanced to the level of occlusion. The aspiration catheter was connected to a penumbra aspiration pump. The guiding catheter balloon was inflated. The aspiration catheter were removed under constant aspiration. Follow-up left ICA angiograms showed persistent occlusion which appear to have progressed more distally. Under biplane roadmap, a zoom 55 aspiration catheter was navigated over an Aristotle 18 microguidewire into the cavernous segment of the left ICA and then advanced to the level of occlusion. The aspiration catheter was connected to a penumbra aspiration pump. The guiding catheter balloon was inflated. The aspiration catheter were removed under constant aspiration. Follow-up left ICA angiograms showed no significant change. Under biplane roadmap, a zoom 55 aspiration catheter was navigated over a phenom 21 microcatheter and a Aristotle 18 microguidewire into the cavernous segment of the right ICA. The microcatheter was then navigated over the wire into the left M3/MCA middle division branch. Then, a 4 x 40 mm solitaire stent retriever was deployed spanning the left M2 and proximal M3 segment. The device was allowed to intercalated with the clot for 4 minutes. The microcatheter was removed. The aspiration catheter was advanced to the level of occlusion and connected to a penumbra aspiration pump. The thrombectomy device and aspiration catheter were removed under constant aspiration. Follow-up left ICA angiogram showed significant change. Under biplane roadmap, a zoom 55 aspiration catheter was navigated over  a phenom 21 microcatheter and a Aristotle 18 microguidewire into the cavernous segment of the right ICA. The microcatheter was then navigated over the wire into the left M3/MCA middle division branch. Then, a 4 x 40 mm solitaire stent retriever was deployed spanning the left M2 and proximal M3 segment. The device was allowed to intercalated with the clot for 4 minutes. The  microcatheter was removed. The aspiration catheter was advanced to the level of occlusion and connected to a penumbra aspiration pump. The thrombectomy device and aspiration catheter were removed under constant aspiration. Follow-up angiogram showed persistent occlusion. Flat panel CT of the head was obtained and post processed in a separate workstation with concurrent attending physician supervision. Selected images were sent to PACS. Small contrast extravasation in the left sylvian fissure noted. Follow-up angiogram showed no evidence of active contrast extravasation with persistent occlusion of a left M2 branch. Delay flat panel CT of the head was obtained and post processed in a separate workstation with concurrent attending physician supervision. Selected images were sent to PACS. Left sylvian contrast extravasation appears stable. Delayed left ICA angiograms confirmed no evidence of contrast extravasation. The catheter was subsequently withdrawn. Left common femoral artery angiograms were obtained in frontal and lateral views. The puncture is at the level of the common femoral artery which has adequate caliber for closure device. The sheath was exchanged for a Perclose ProGlide which was utilized for access closure. Immediate hemostasis was achieved. IMPRESSION: Unsuccessful attempted recanalization of a mid left M2/MCA middle division branch complicated by small amount contrast extravasation in the left sylvian fissure. No active extravasation seen at the end of the procedure. Left MCA status remains TICI2b, which is similar to presentation. PLAN: Patient is to remain intubated given unsuccessful recanalization and COVID positive test. She will be transferred to ICU for continued care. Electronically Signed   By: Pedro Earls M.D.   On: 11/27/2020 09:25   IR US Guide Vasc Access Right  Result Date: 11/27/2020 INDICATION: 70 year old female with past medical history significant for  rheumatoid arthritis, diabetes mellitus, hypertension, anemia and melanoma presenting with acute onset of right hemiparesis, aphasia and left gaze preference. She was found to be covered positive today. Last known well at 10:30 a.m. on 11/24/2020; modified Rankin scale 0; NIHSS 7. No IV tPA given as she was outside the window. Head CT showed an acute infarct the in the left insula and CT angiogram showed intracranial atherosclerotic disease with a left M2/MCA occlusion. EXAM: Ultrasound-guided vascular access Diagnostic cerebral angiogram Mechanical thrombectomy Flat panel head CT COMPARISON:  CT/CT angiogram of the head and neck November 24, 2020. MEDICATIONS: Refer to anesthesia documentation. ANESTHESIA/SEDATION: The procedure was performed under general anesthesia CONTRAST:  For 75 mL of Omnipaque 240 mg/mL FLUOROSCOPY TIME:  Fluoroscopy Time: 24 minutes 54 seconds (1,520 mGy). COMPLICATIONS: SIR Level A - No therapy, no consequence. TECHNIQUE: Informed written consent was obtained from the patient's son after a thorough discussion of the procedural risks, benefits and alternatives. All questions were addressed. Maximal Sterile Barrier Technique was utilized including caps, mask, sterile gowns, sterile gloves, sterile drape, hand hygiene and skin antiseptic. A timeout was performed prior to the initiation of the procedure. The right groin was prepped and draped in the usual sterile fashion. Using a micropuncture kit and the modified Seldinger technique, access was gained to the right common femoral artery and an 8 French sheath was placed. Real-time ultrasound guidance was utilized for vascular access including the acquisition of a  permanent ultrasound image documenting patency of the accessed vessel. Under fluoroscopy, a Zoom 88 guide catheter was navigated over a 6 [place] Berenstein 2 catheter and a 0.035" Terumo Glidewire into the aortic arch. The catheter was placed into the left common carotid artery and  then advanced into the left internal carotid artery. The inner catheter was removed. Frontal and lateral angiograms of the head were obtained. FINDINGS: 1. Occlusion of the mid left M2/MCA middle division branch. 2. Intracranial atherosclerotic disease with multiple luminal irregularities along the left MCA and left ACA vascular tree more significant at the A3 segment where there is moderate stenosis. 3. Patent right common femoral artery. PROCEDURE: Under biplane roadmap, a zoom 55 aspiration catheter was navigated over an Aristotle 18 microguidewire into the cavernous segment of the left ICA and then advanced to the level of occlusion. The aspiration catheter was connected to a penumbra aspiration pump. The guiding catheter balloon was inflated. The aspiration catheter were removed under constant aspiration. Follow-up left ICA angiograms showed persistent occlusion which appear to have progressed more distally. Under biplane roadmap, a zoom 55 aspiration catheter was navigated over an Aristotle 18 microguidewire into the cavernous segment of the left ICA and then advanced to the level of occlusion. The aspiration catheter was connected to a penumbra aspiration pump. The guiding catheter balloon was inflated. The aspiration catheter were removed under constant aspiration. Follow-up left ICA angiograms showed no significant change. Under biplane roadmap, a zoom 55 aspiration catheter was navigated over a phenom 21 microcatheter and a Aristotle 18 microguidewire into the cavernous segment of the right ICA. The microcatheter was then navigated over the wire into the left M3/MCA middle division branch. Then, a 4 x 40 mm solitaire stent retriever was deployed spanning the left M2 and proximal M3 segment. The device was allowed to intercalated with the clot for 4 minutes. The microcatheter was removed. The aspiration catheter was advanced to the level of occlusion and connected to a penumbra aspiration pump. The  thrombectomy device and aspiration catheter were removed under constant aspiration. Follow-up left ICA angiogram showed significant change. Under biplane roadmap, a zoom 55 aspiration catheter was navigated over a phenom 21 microcatheter and a Aristotle 18 microguidewire into the cavernous segment of the right ICA. The microcatheter was then navigated over the wire into the left M3/MCA middle division branch. Then, a 4 x 40 mm solitaire stent retriever was deployed spanning the left M2 and proximal M3 segment. The device was allowed to intercalated with the clot for 4 minutes. The microcatheter was removed. The aspiration catheter was advanced to the level of occlusion and connected to a penumbra aspiration pump. The thrombectomy device and aspiration catheter were removed under constant aspiration. Follow-up angiogram showed persistent occlusion. Flat panel CT of the head was obtained and post processed in a separate workstation with concurrent attending physician supervision. Selected images were sent to PACS. Small contrast extravasation in the left sylvian fissure noted. Follow-up angiogram showed no evidence of active contrast extravasation with persistent occlusion of a left M2 branch. Delay flat panel CT of the head was obtained and post processed in a separate workstation with concurrent attending physician supervision. Selected images were sent to PACS. Left sylvian contrast extravasation appears stable. Delayed left ICA angiograms confirmed no evidence of contrast extravasation. The catheter was subsequently withdrawn. Left common femoral artery angiograms were obtained in frontal and lateral views. The puncture is at the level of the common femoral artery which has adequate caliber for closure  device. The sheath was exchanged for a Perclose ProGlide which was utilized for access closure. Immediate hemostasis was achieved. IMPRESSION: Unsuccessful attempted recanalization of a mid left M2/MCA middle  division branch complicated by small amount contrast extravasation in the left sylvian fissure. No active extravasation seen at the end of the procedure. Left MCA status remains TICI2b, which is similar to presentation. PLAN: Patient is to remain intubated given unsuccessful recanalization and COVID positive test. She will be transferred to ICU for continued care. Electronically Signed   By: Pedro Earls M.D.   On: 11/27/2020 09:25   CT CEREBRAL PERFUSION W CONTRAST  Result Date: 11/24/2020 CLINICAL DATA:  Neuro deficit, acute, stroke suspected EXAM: CT PERFUSION BRAIN TECHNIQUE: Multiphase CT imaging of the brain was performed following IV bolus contrast injection. Subsequent parametric perfusion maps were calculated using RAPID software. CONTRAST:  106m OMNIPAQUE IOHEXOL 350 MG/ML SOLN COMPARISON:  Concurrent CTA head and neck. FINDINGS: CT Brain Perfusion Findings: CBF (<30%) Volume: 039mPerfusion (Tmax>6.0s) volume: 3065mismatch Volume: 3m60mPECTS on noncontrast CT Head: 9 at 16:42 today. Infarct Core: 0 mL Infarction Location:Not applicable. IMPRESSION: Perfusion imaging demonstrates no infarct core. 30 mL region of ischemia involving the left MCA territory. Electronically Signed   By: ChikPrimitivo Gauze.   On: 11/24/2020 16:55   DG Chest Port 1 View  Result Date: 12/03/2020 CLINICAL DATA:  Shortness of breath.  COVID-19 positive EXAM: PORTABLE CHEST 1 VIEW COMPARISON:  December 02, 2020 FINDINGS: Patchy airspace opacity is noted in the lung bases, more on the right than on the left. Lungs elsewhere clear. Heart is upper normal in size with pulmonary vascularity normal. No adenopathy. There is aortic atherosclerosis. There is calcification in each carotid artery. No bone lesions. IMPRESSION: Persistent airspace opacity bases, more on the right than the left, likely due to atypical organism pneumonia. No new opacity evident. Stable cardiac silhouette. Aortic Atherosclerosis  (ICD10-I70.0). There are foci of carotid artery calcification bilaterally. Electronically Signed   By: WillLowella Grip M.D.   On: 12/03/2020 08:41   DG Chest Port 1 View  Result Date: 12/02/2020 CLINICAL DATA:  Shortness of breath EXAM: PORTABLE CHEST 1 VIEW COMPARISON:  November 24, 2020 FINDINGS: The cardiomediastinal silhouette is unchanged in contour.Interval extubation. Atherosclerotic calcifications of the aorta. No pleural effusion. No pneumothorax. Patchy RIGHT basilar peripheral opacities, similar in comparison to prior. Mildly improved aeration of the LEFT lung base in comparison to prior. Visualized abdomen is unremarkable. Multilevel degenerative changes of the thoracic spine. IMPRESSION: 1. Patchy RIGHT basilar peripheral opacities, similar in comparison to prior and consistent with the sequela of COVID-19 infection. 2. Mildly improved aeration of the LEFT lung base in comparison to prior. Electronically Signed   By: StepValentino Saxon  On: 12/02/2020 11:26   DG Chest Port 1 View  Result Date: 11/24/2020 CLINICAL DATA:  COVID positive EXAM: PORTABLE CHEST 1 VIEW COMPARISON:  None. FINDINGS: The heart size and mediastinal contours are mildly enlarged. ETT is 4 cm above the carina. NG tube is seen below the diaphragm. Aortic knob calcifications are seen. There is hazy patchy airspace opacity seen at the periphery of the right lower lung and at the left lung base. No pleural effusion. IMPRESSION: ETT and NG tube in satisfactory position Bilateral multifocal airspace opacities, consistent with multifocal pneumonia Electronically Signed   By: BindPrudencio Pair.   On: 11/24/2020 22:14   IR PERCUTANEOUS ART THROMBECTOMY/INFUSION INTRACRANIAL INC DIAG ANGIO  Result Date:  11/27/2020 INDICATION: 70 year old female with past medical history significant for rheumatoid arthritis, diabetes mellitus, hypertension, anemia and melanoma presenting with acute onset of right hemiparesis, aphasia and left  gaze preference. She was found to be covered positive today. Last known well at 10:30 a.m. on 11/24/2020; modified Rankin scale 0; NIHSS 7. No IV tPA given as she was outside the window. Head CT showed an acute infarct the in the left insula and CT angiogram showed intracranial atherosclerotic disease with a left M2/MCA occlusion. EXAM: Ultrasound-guided vascular access Diagnostic cerebral angiogram Mechanical thrombectomy Flat panel head CT COMPARISON:  CT/CT angiogram of the head and neck November 24, 2020. MEDICATIONS: Refer to anesthesia documentation. ANESTHESIA/SEDATION: The procedure was performed under general anesthesia CONTRAST:  For 75 mL of Omnipaque 240 mg/mL FLUOROSCOPY TIME:  Fluoroscopy Time: 24 minutes 54 seconds (1,520 mGy). COMPLICATIONS: SIR Level A - No therapy, no consequence. TECHNIQUE: Informed written consent was obtained from the patient's son after a thorough discussion of the procedural risks, benefits and alternatives. All questions were addressed. Maximal Sterile Barrier Technique was utilized including caps, mask, sterile gowns, sterile gloves, sterile drape, hand hygiene and skin antiseptic. A timeout was performed prior to the initiation of the procedure. The right groin was prepped and draped in the usual sterile fashion. Using a micropuncture kit and the modified Seldinger technique, access was gained to the right common femoral artery and an 8 French sheath was placed. Real-time ultrasound guidance was utilized for vascular access including the acquisition of a permanent ultrasound image documenting patency of the accessed vessel. Under fluoroscopy, a Zoom 88 guide catheter was navigated over a 6 [place] Berenstein 2 catheter and a 0.035" Terumo Glidewire into the aortic arch. The catheter was placed into the left common carotid artery and then advanced into the left internal carotid artery. The inner catheter was removed. Frontal and lateral angiograms of the head were obtained.  FINDINGS: 1. Occlusion of the mid left M2/MCA middle division branch. 2. Intracranial atherosclerotic disease with multiple luminal irregularities along the left MCA and left ACA vascular tree more significant at the A3 segment where there is moderate stenosis. 3. Patent right common femoral artery. PROCEDURE: Under biplane roadmap, a zoom 55 aspiration catheter was navigated over an Aristotle 18 microguidewire into the cavernous segment of the left ICA and then advanced to the level of occlusion. The aspiration catheter was connected to a penumbra aspiration pump. The guiding catheter balloon was inflated. The aspiration catheter were removed under constant aspiration. Follow-up left ICA angiograms showed persistent occlusion which appear to have progressed more distally. Under biplane roadmap, a zoom 55 aspiration catheter was navigated over an Aristotle 18 microguidewire into the cavernous segment of the left ICA and then advanced to the level of occlusion. The aspiration catheter was connected to a penumbra aspiration pump. The guiding catheter balloon was inflated. The aspiration catheter were removed under constant aspiration. Follow-up left ICA angiograms showed no significant change. Under biplane roadmap, a zoom 55 aspiration catheter was navigated over a phenom 21 microcatheter and a Aristotle 18 microguidewire into the cavernous segment of the right ICA. The microcatheter was then navigated over the wire into the left M3/MCA middle division branch. Then, a 4 x 40 mm solitaire stent retriever was deployed spanning the left M2 and proximal M3 segment. The device was allowed to intercalated with the clot for 4 minutes. The microcatheter was removed. The aspiration catheter was advanced to the level of occlusion and connected to a penumbra aspiration pump.  The thrombectomy device and aspiration catheter were removed under constant aspiration. Follow-up left ICA angiogram showed significant change. Under  biplane roadmap, a zoom 55 aspiration catheter was navigated over a phenom 21 microcatheter and a Aristotle 18 microguidewire into the cavernous segment of the right ICA. The microcatheter was then navigated over the wire into the left M3/MCA middle division branch. Then, a 4 x 40 mm solitaire stent retriever was deployed spanning the left M2 and proximal M3 segment. The device was allowed to intercalated with the clot for 4 minutes. The microcatheter was removed. The aspiration catheter was advanced to the level of occlusion and connected to a penumbra aspiration pump. The thrombectomy device and aspiration catheter were removed under constant aspiration. Follow-up angiogram showed persistent occlusion. Flat panel CT of the head was obtained and post processed in a separate workstation with concurrent attending physician supervision. Selected images were sent to PACS. Small contrast extravasation in the left sylvian fissure noted. Follow-up angiogram showed no evidence of active contrast extravasation with persistent occlusion of a left M2 branch. Delay flat panel CT of the head was obtained and post processed in a separate workstation with concurrent attending physician supervision. Selected images were sent to PACS. Left sylvian contrast extravasation appears stable. Delayed left ICA angiograms confirmed no evidence of contrast extravasation. The catheter was subsequently withdrawn. Left common femoral artery angiograms were obtained in frontal and lateral views. The puncture is at the level of the common femoral artery which has adequate caliber for closure device. The sheath was exchanged for a Perclose ProGlide which was utilized for access closure. Immediate hemostasis was achieved. IMPRESSION: Unsuccessful attempted recanalization of a mid left M2/MCA middle division branch complicated by small amount contrast extravasation in the left sylvian fissure. No active extravasation seen at the end of the  procedure. Left MCA status remains TICI2b, which is similar to presentation. PLAN: Patient is to remain intubated given unsuccessful recanalization and COVID positive test. She will be transferred to ICU for continued care. Electronically Signed   By: Pedro Earls M.D.   On: 11/27/2020 09:25   CT HEAD CODE STROKE WO CONTRAST  Result Date: 11/24/2020 CLINICAL DATA:  Code stroke.  Neuro deficit, acute, stroke suspected EXAM: CT HEAD WITHOUT CONTRAST TECHNIQUE: Contiguous axial images were obtained from the base of the skull through the vertex without intravenous contrast. COMPARISON:  06/27/2020. FINDINGS: Brain: No intracranial hemorrhage. New focal hypodensity involving the left insula. No mass lesion. No midline shift, ventriculomegaly or extra-axial fluid collection. Chronic microvascular ischemic changes. Vascular: No hyperdense vessel or unexpected calcification. Minimal carotid siphon atherosclerotic calcifications. Skull: Negative for fracture or focal lesion. Sinuses/Orbits: No acute finding.  Mild pansinus mucosal thickening. Other: Right parietal scalp nodule is unchanged. ASPECTS [facility] Stroke Program Early CT Score) - Ganglionic level infarction (caudate, lentiform nuclei, internal capsule, insula, M1-M3 cortex): 6 - Supraganglionic infarction (M4-M6 cortex): 3 Total score (0-10 with 10 being normal): 9 IMPRESSION: 1. 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 2. Chronic microvascular ischemic changes. 3. ASPECTS is 9 Code stroke imaging results were communicated on 11/24/2020 at 4:23 pm to provider Dr. Rory Percy via secure text paging. Electronically Signed   By: Primitivo Gauze M.D.   On: 11/24/2020 16:24   VAS [place] LOWER EXTREMITY VENOUS (DVT)  Result Date: 11/26/2020  Lower Venous DVT Study Indications: Stroke, and Covid-19.  Comparison Study: No prior study on file Performing Technologist: Sharion Dove RVS  Examination Guidelines: A complete evaluation includes B-mode  imaging,  spectral Doppler, color Doppler, and power Doppler as needed of all accessible portions of each vessel. Bilateral testing is considered an integral part of a complete examination. Limited examinations for reoccurring indications may be performed as noted. The reflux portion of the exam is performed with the patient in reverse Trendelenburg.  +---------+---------------+---------+-----------+----------+--------------+ RIGHT    CompressibilityPhasicitySpontaneityPropertiesThrombus Aging +---------+---------------+---------+-----------+----------+--------------+ CFV      Full           Yes      Yes                                 +---------+---------------+---------+-----------+----------+--------------+ SFJ      Full                                                        +---------+---------------+---------+-----------+----------+--------------+ FV Prox  Full                                                        +---------+---------------+---------+-----------+----------+--------------+ FV Mid   Full                                                        +---------+---------------+---------+-----------+----------+--------------+ FV DistalFull                                                        +---------+---------------+---------+-----------+----------+--------------+ PFV      Full                                                        +---------+---------------+---------+-----------+----------+--------------+ POP      Full           Yes      Yes                                 +---------+---------------+---------+-----------+----------+--------------+ PTV      Full                                                        +---------+---------------+---------+-----------+----------+--------------+ PERO     None                                         Acute          +---------+---------------+---------+-----------+----------+--------------+    +---------+---------------+---------+-----------+----------+--------------+  LEFT     CompressibilityPhasicitySpontaneityPropertiesThrombus Aging +---------+---------------+---------+-----------+----------+--------------+ CFV      Full           Yes      Yes                                 +---------+---------------+---------+-----------+----------+--------------+ SFJ      Full                                                        +---------+---------------+---------+-----------+----------+--------------+ FV Prox  Full                                                        +---------+---------------+---------+-----------+----------+--------------+ FV Mid   Full                                                        +---------+---------------+---------+-----------+----------+--------------+ FV DistalFull                                                        +---------+---------------+---------+-----------+----------+--------------+ PFV      Full                                                        +---------+---------------+---------+-----------+----------+--------------+ POP      Full           Yes      Yes                                 +---------+---------------+---------+-----------+----------+--------------+ PTV      Full                                                        +---------+---------------+---------+-----------+----------+--------------+ PERO     Full                                                        +---------+---------------+---------+-----------+----------+--------------+     Summary: RIGHT: - Findings consistent with acute deep vein thrombosis involving the right peroneal veins.  LEFT: - There is no evidence of deep vein thrombosis in the lower extremity.  *See table(s) above for measurements and observations. Electronically signed by Servando Snare MD  on 11/26/2020 at 5:59:41 PM.    Final    ECHOCARDIOGRAM  LIMITED  Result Date: 11/25/2020    ECHOCARDIOGRAM REPORT   Patient Name:   JINA OLENICK [facility] Date of Exam: 11/25/2020 Medical Rec #:  [id number]        Height:       64.0 in Accession #:    [id number]       Weight:       176.4 lb Date of Birth:  [birth year]        BSA:          1.855 m Patient Age:    61 years         BP:           119/66 mmHg Patient Gender: F                HR:           77 bpm. Exam Location:  Inpatient Procedure: Limited Echo, Cardiac Doppler and Color Doppler Indications:    Stroke I63.9  History:        Patient has no prior history of Echocardiogram examinations.                 Risk Factors:Hypertension, Diabetes and Former Smoker.  Sonographer:    Vickie Epley RDCS Referring Phys: [id number] ASHISH ARORA  Sonographer Comments: Echo performed with patient supine and on artificial respirator. Covid positive. IMPRESSIONS  1. Left ventricular ejection fraction, by estimation, is 55 to 60%. The left ventricle has normal function. The left ventricle has no regional wall motion abnormalities. Left ventricular diastolic parameters are consistent with Grade I diastolic dysfunction (impaired relaxation).  2. Right ventricular systolic function is normal. The right ventricular size is normal.  3. The mitral valve is normal in structure. Trivial mitral valve regurgitation. No evidence of mitral stenosis.  4. The aortic valve is tricuspid. Aortic valve regurgitation is trivial. Mild aortic valve sclerosis is present, with no evidence of aortic valve stenosis.  5. The inferior vena cava is normal in size with greater than 50% respiratory variability, suggesting right atrial pressure of 3 mmHg. FINDINGS  Left Ventricle: Left ventricular ejection fraction, by estimation, is 55 to 60%. The left ventricle has normal function. The left ventricle has no regional wall motion abnormalities. The left ventricular internal cavity size was normal in size. There is  no left ventricular hypertrophy. Left ventricular  diastolic parameters are consistent with Grade I diastolic dysfunction (impaired relaxation). Right Ventricle: The right ventricular size is normal.Right ventricular systolic function is normal. Left Atrium: Left atrial size was normal in size. Right Atrium: Right atrial size was normal in size. Pericardium: There is no evidence of pericardial effusion. Mitral Valve: The mitral valve is normal in structure. Trivial mitral valve regurgitation. No evidence of mitral valve stenosis. Tricuspid Valve: The tricuspid valve is normal in structure. Tricuspid valve regurgitation is trivial. No evidence of tricuspid stenosis. Aortic Valve: The aortic valve is tricuspid. Aortic valve regurgitation is trivial. Mild aortic valve sclerosis is present, with no evidence of aortic valve stenosis. Pulmonic Valve: The pulmonic valve was not well visualized. Pulmonic valve regurgitation is trivial. No evidence of pulmonic stenosis. Aorta: The aortic root is normal in size and structure. Venous: The inferior vena cava is normal in size with greater than 50% respiratory variability, suggesting right atrial pressure of 3 mmHg.  LEFT VENTRICLE PLAX 2D LVIDd:         4.10 cm  Diastology LVIDs:         3.20 cm     LV e' medial:    6.09 cm/s LV PW:         1.00 cm     LV E/e' medial:  11.1 LV IVS:        0.90 cm     LV e' lateral:   9.03 cm/s LVOT diam:     2.00 cm     LV E/e' lateral: 7.5 LV SV:         42 LV SV Index:   23 LVOT Area:     3.14 cm  LV Volumes (MOD) LV vol d, MOD A2C: 88.5 ml LV vol d, MOD A4C: 80.0 ml LV vol s, MOD A2C: 46.6 ml LV vol s, MOD A4C: 41.6 ml LV SV MOD A2C:     41.9 ml LV SV MOD A4C:     80.0 ml LV SV MOD BP:      40.6 ml RIGHT VENTRICLE RV S prime:     13.80 cm/s TAPSE (M-mode): 1.5 cm LEFT ATRIUM         Index LA diam:    2.70 cm 1.46 cm/m  AORTIC VALVE LVOT Vmax:   71.80 cm/s LVOT Vmean:  56.700 cm/s LVOT VTI:    0.134 m  AORTA Ao Root diam: 3.70 cm MITRAL VALVE MV Area (PHT): 3.85 cm    SHUNTS MV Decel  Time: 197 msec    Systemic VTI:  0.13 m MV E velocity: 67.70 cm/s  Systemic Diam: 2.00 cm MV A velocity: 81.00 cm/s MV E/A ratio:  0.84 Kirk Ruths MD Electronically signed by Kirk Ruths MD Signature Date/Time: 11/25/2020/10:54:57 AM    Final    CT ANGIO HEAD CODE STROKE  Result Date: 11/24/2020 CLINICAL DATA:  Neuro deficit, acute, stroke suspected EXAM: CT ANGIOGRAPHY HEAD AND NECK TECHNIQUE: Multidetector CT imaging of the head and neck was performed using the standard protocol during bolus administration of intravenous contrast. Multiplanar CT image reconstructions and MIPs were obtained to evaluate the vascular anatomy. Carotid stenosis measurements (when applicable) are obtained utilizing NASCET criteria, using the distal internal carotid diameter as the denominator. CONTRAST:  81m OMNIPAQUE IOHEXOL 350 MG/ML SOLN COMPARISON:  None. FINDINGS: CTA NECK FINDINGS Aortic arch: Standard branching. Mild aortic arch atherosclerotic calcifications. Mild proximal left subclavian artery narrowing secondary to atheromatous plaque. Otherwise patent great vessel origins. Right carotid system: Patent. Minimal proximal ICA atherosclerotic calcifications without significant narrowing. Left carotid system: Patent. 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 Vertebral arteries: Mild left vertebral artery origin narrowing secondary to atheromatous disease. Otherwise patent and codominant. Skeleton: No acute finding.  Mild spondylosis. Other neck: No adenopathy.  No soft tissue mass. Upper chest: Patchy and nodular bilateral upper lung opacities concerning for infectious/inflammatory foci. Review of the MIP images confirms the above findings CTA HEAD FINDINGS Anterior circulation: Minimal carotid siphon atherosclerotic calcifications. Patent ICAs. Patent ophthalmic artery origins. Right A1 segment hypoplasia. Patent ACAs. Patent right MCA. Left M2 segment occlusion. Posterior circulation: Patent V4  segments. Patent right PICA. Patent basilar and superior cerebellar arteries. Patent bilateral PCAs. Venous sinuses: As permitted by contrast timing, patent. Anatomic variants: Please see above. Review of the MIP images confirms the above findings IMPRESSION: CTA neck: 30-40% distal left CCA narrowing secondary to atheromatous plaque. Mild narrowing of the proximal left subclavian and left vertebral artery origin. Bilateral upper lung nodular/patchy opacities concerning for infectious/inflammatory foci. CTA head: Left M2 branch  occlusion. No aneurysm or dissection. Code stroke imaging results were communicated on 11/24/2020 at 4:41 pm to provider Dr. Rory Percy via secure text paging. Electronically Signed   By: Primitivo Gauze M.D.   On: 11/24/2020 16:41   CT ANGIO NECK CODE STROKE  Result Date: 11/24/2020 CLINICAL DATA:  Neuro deficit, acute, stroke suspected EXAM: CT ANGIOGRAPHY HEAD AND NECK TECHNIQUE: Multidetector CT imaging of the head and neck was performed using the standard protocol during bolus administration of intravenous contrast. Multiplanar CT image reconstructions and MIPs were obtained to evaluate the vascular anatomy. Carotid stenosis measurements (when applicable) are obtained utilizing NASCET criteria, using the distal internal carotid diameter as the denominator. CONTRAST:  46m OMNIPAQUE IOHEXOL 350 MG/ML SOLN COMPARISON:  None. FINDINGS: CTA NECK FINDINGS Aortic arch: Standard branching. Mild aortic arch atherosclerotic calcifications. Mild proximal left subclavian artery narrowing secondary to atheromatous plaque. Otherwise patent great vessel origins. Right carotid system: Patent. Minimal proximal ICA atherosclerotic calcifications without significant narrowing. Left carotid system: Patent. 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 Vertebral arteries: Mild left vertebral artery origin narrowing secondary to atheromatous disease. Otherwise patent and codominant.  Skeleton: No acute finding.  Mild spondylosis. Other neck: No adenopathy.  No soft tissue mass. Upper chest: Patchy and nodular bilateral upper lung opacities concerning for infectious/inflammatory foci. Review of the MIP images confirms the above findings CTA HEAD FINDINGS Anterior circulation: Minimal carotid siphon atherosclerotic calcifications. Patent ICAs. Patent ophthalmic artery origins. Right A1 segment hypoplasia. Patent ACAs. Patent right MCA. Left M2 segment occlusion. Posterior circulation: Patent V4 segments. Patent right PICA. Patent basilar and superior cerebellar arteries. Patent bilateral PCAs. Venous sinuses: As permitted by contrast timing, patent. Anatomic variants: Please see above. Review of the MIP images confirms the above findings IMPRESSION: CTA neck: 30-40% distal left CCA narrowing secondary to atheromatous plaque. Mild narrowing of the proximal left subclavian and left vertebral artery origin. Bilateral upper lung nodular/patchy opacities concerning for infectious/inflammatory foci. CTA head: Left M2 branch occlusion. No aneurysm or dissection. Code stroke imaging results were communicated on 11/24/2020 at 4:41 pm to provider Dr. ARory Percyvia secure text paging. Electronically Signed   By: CPrimitivo GauzeM.D.   On: 11/24/2020 16:41

## 2020-12-04 NOTE — Progress Notes (Signed)
CSW left voicemail and HIPPA compliant text for patient's son, Erlene Quan, regarding SNF placement (as patient is still in COVID precautions and cannot go to [facility]).  Nadia Rayyan LCSW

## 2020-12-04 NOTE — NC FL2 (Signed)
[address] MEDICAID FL2 LEVEL OF CARE SCREENING TOOL     IDENTIFICATION  Patient Name: Emily Lozano Birthdate: [birth year] Sex: female Admission Date (Current Location): 11/24/2020  [facility] and [place] Number:  Engineering geologist and Address:  [facility] [place]. [facility], [place] [address], [employer], [place] [address]      Provider Number: [id number]  Attending Physician Name and Address:  Thurnell Lose, MD  Relative Name and Phone Number:  Erlene Quan, son , [phone]    Current Level of Care: Hospital Recommended Level of Care: [place] Prior Approval Number:    Date Approved/Denied:   PASRR Number: [id number]  Discharge Plan: SNF    Current Diagnoses: Patient Active Problem List   Diagnosis Date Noted   Pneumonia due to COVID-19 virus    Acute ischemic stroke ([place]) 11/24/2020    Orientation RESPIRATION BLADDER Height & Weight     Self  Normal Continent,External catheter Weight: 153 lb (69.4 kg) Height:  5\' 4"  (162.6 cm)  BEHAVIORAL SYMPTOMS/MOOD NEUROLOGICAL BOWEL NUTRITION STATUS      Incontinent Diet  AMBULATORY STATUS COMMUNICATION OF NEEDS Skin   Extensive Assist Verbally Surgical wounds (Closed incision on groin)                       Personal Care Assistance Level of Assistance  Bathing,Feeding,Dressing Bathing Assistance: Maximum assistance Feeding assistance: Maximum assistance Dressing Assistance: Maximum assistance     Functional Limitations Info             [place]  PT (By licensed PT),OT (By licensed OT)     PT Frequency: 5x/week OT Frequency: 5x/week            Contractures Contractures Info: Not present    Additional Factors Info  Code Status,Allergies,Isolation Precautions,Insulin Sliding Scale Code Status Info: Full Allergies Info: Ativan (Lorazepam), Morphine And Related   Insulin Sliding Scale Info: See dc summary Isolation Precautions Info: COVID + on  11/24/20. Pfizer COVID-19 Vaccine 01/25/2020 , 01/04/2020     Current Medications (12/04/2020):  This is the current hospital active medication list Current Facility-Administered Medications  Medication Dose Route Frequency Provider Last Rate Last Admin   acetaminophen (TYLENOL) tablet 650 mg  650 mg Oral Q4H PRN Amie Portland, MD   650 mg at 12/04/20 0959   Or   acetaminophen (TYLENOL) 160 MG/5ML solution 650 mg  650 mg Per Tube Q4H PRN Amie Portland, MD   650 mg at 11/28/20 0423   Or   acetaminophen (TYLENOL) suppository 650 mg  650 mg Rectal Q4H PRN Amie Portland, MD       amLODipine (NORVASC) tablet 10 mg  10 mg Oral Daily Thurnell Lose, MD   10 mg at 12/04/20 0958   apixaban (ELIQUIS) tablet 10 mg  10 mg Oral BID Lavenia Atlas, RPH   10 mg at 12/04/20 2202   Followed by   Derrill Memo ON 12/09/2020] apixaban (ELIQUIS) tablet 5 mg  5 mg Oral BID Lavenia Atlas, RPH       chlorhexidine (PERIDEX) 0.12 % solution 15 mL  15 mL Mouth Rinse BID Garvin Fila, MD   15 mL at 12/04/20 0958   hydrALAZINE (APRESOLINE) injection 10 mg  10 mg Intravenous Q8H PRN Thurnell Lose, MD   10 mg at 12/04/20 1251   insulin aspart (novoLOG) injection 0-15 Units  0-15 Units Subcutaneous TID WC Jean Rosenthal, MD   3  Units at 12/04/20 1204   insulin detemir (LEVEMIR) injection 12 Units  12 Units Subcutaneous Daily Thurnell Lose, MD   12 Units at 12/04/20 0957   MEDLINE mouth rinse  15 mL Mouth Rinse q12n4p Garvin Fila, MD   15 mL at 12/04/20 1205   metoprolol tartrate (LOPRESSOR) tablet 50 mg  50 mg Oral BID Garvin Fila, MD   50 mg at 12/04/20 0959   mometasone-formoterol (DULERA) 100-5 MCG/ACT inhaler 2 puff  2 puff Inhalation BID Brand Males, MD   2 puff at 12/04/20 0744   pantoprazole (PROTONIX) EC tablet 40 mg  40 mg Oral QHS Garvin Fila, MD   40 mg at 12/03/20 2123   potassium chloride SA (KLOR-CON) CR tablet 20 mEq  20 mEq Oral Daily Noemi Chapel P, DO   20 mEq  at 12/04/20 4854   Resource ThickenUp Clear   Oral PRN Garvin Fila, MD       rosuvastatin (CRESTOR) tablet 10 mg  10 mg Oral Daily Rosalin Hawking, MD   10 mg at 12/04/20 6270   senna-docusate (Senokot-S) tablet 1 tablet  1 tablet Per Tube QHS PRN Amie Portland, MD   1 tablet at 11/30/20 1142   sodium chloride flush (NS) 0.9 % injection 3 mL  3 mL Intravenous Once Hayden Rasmussen, MD         Discharge Medications: Please see discharge summary for a list of discharge medications.  Relevant Imaging Results:  Relevant Lab Results:   Additional Information SSN: [id number]  [place], [place]

## 2020-12-04 NOTE — TOC Initial Note (Signed)
Transition of Care [facility]) - Initial/Assessment Note    Patient Details  Name: Emily Lozano MRN: [id number] Date of Birth: December 17, 1950  Transition of Care [facility]) CM/SW Contact:    Benard Halsted, LCSW Phone Number: 12/04/2020, 4:39 PM  Clinical Narrative:                 CSW received consult for possible SNF placement at time of discharge. CSW spoke with patient's son, Emily Lozano. He reported that patient's spouse is currently unable to care for patient at their home given patient's current physical needs and fall risk. Patient expressed understanding of PT recommendation and is agreeable to SNF placement at time of discharge since [facility] is unable to accept patient. Patient's son reported preference for somewhere in [place]. CSW discussed insurance authorization process and provided Medicare SNF ratings list. CSW explained SNF barriers due to COVID positive status. Patient has received the COVID vaccines. Emily Lozano stated he will research the facilities tonight and let CSW know tomorrow.   Expected Discharge Plan: Skilled Nursing Facility Barriers to Discharge: Continued Medical Work up,SNF Pending bed offer,SNF Covid   Patient Goals and CMS Choice Patient states their goals for this hospitalization and ongoing recovery are:: rehab CMS Medicare.gov Compare Post Acute Care list provided to:: Patient Represenative (must comment) Choice offered to / list presented to : Adult Children  Expected Discharge Plan and Services Expected Discharge Plan: [place] In-house Referral: Clinical Social Work   Post Acute Care Choice: [place] Living arrangements for the past 2 months: [place]                                      Prior Living Arrangements/Services Living arrangements for the past 2 months: Single Family Home Lives with:: Self Patient language and need for interpreter reviewed:: Yes Do you feel safe going back to the place where you  live?: Yes      Need for Family Participation in Patient Care: Yes (Comment) Care giver support system in place?: Yes (comment)   Criminal Activity/Legal Involvement Pertinent to Current Situation/Hospitalization: No - Comment as needed  Activities of Daily Living   ADL Screening (condition at time of admission) Is the patient deaf or have difficulty hearing?: No Does the patient have difficulty seeing, even when wearing glasses/contacts?: No Does the patient have difficulty concentrating, remembering, or making decisions?: Yes Does the patient have difficulty dressing or bathing?: Yes Does the patient have difficulty walking or climbing stairs?: Yes  Permission Sought/Granted Permission sought to share information with : Facility Contact Representative,Family Supports Permission granted to share information with : Yes, Verbal Permission Granted  Share Information with NAME: Emily Lozano  Permission granted to share info w AGENCY: SNFs  Permission granted to share info w Relationship: Son  Permission granted to share info w Contact Information: [phone]  Emotional Assessment   Attitude/Demeanor/Rapport: Unable to Assess Affect (typically observed): Unable to Assess Orientation: : Oriented to Self Alcohol / Substance Use: Not Applicable Psych Involvement: No (comment)  Admission diagnosis:  Stroke [facility]) [I63.9] Acute ischemic stroke ([place]) [I63.9] Acute CVA (cerebrovascular accident) ([place]) [I63.9] Pneumonia due to COVID-19 virus [U07.1, J12.82] Patient Active Problem List   Diagnosis Date Noted  . Pneumonia due to COVID-19 virus   . Acute ischemic stroke ([place]) 11/24/2020   PCP:  Marinda Elk, MD Pharmacy:   [id number] [place], [place] - [address] [place] [address] Phone: [phone] Fax: [phone]     Social Determinants of Health (SDOH) Interventions    Readmission Risk Interventions No flowsheet data found.

## 2020-12-05 ENCOUNTER — Inpatient Hospital Stay (HOSPITAL_COMMUNITY): Payer: Medicare Other

## 2020-12-05 DIAGNOSIS — J1282 Pneumonia due to coronavirus disease 2019: Secondary | ICD-10-CM | POA: Diagnosis not present

## 2020-12-05 DIAGNOSIS — U071 COVID-19: Secondary | ICD-10-CM | POA: Diagnosis not present

## 2020-12-05 DIAGNOSIS — I639 Cerebral infarction, unspecified: Secondary | ICD-10-CM | POA: Diagnosis not present

## 2020-12-05 LAB — GLUCOSE, CAPILLARY
Glucose-Capillary: 159 mg/dL — ABNORMAL HIGH (ref 70–99)
Glucose-Capillary: 196 mg/dL — ABNORMAL HIGH (ref 70–99)
Glucose-Capillary: 192 mg/dL — ABNORMAL HIGH (ref 70–99)
Glucose-Capillary: 158 mg/dL — ABNORMAL HIGH (ref 70–99)
Glucose-Capillary: 163 mg/dL — ABNORMAL HIGH (ref 70–99)

## 2020-12-05 LAB — COMPREHENSIVE METABOLIC PANEL
ALT: 20 U/L (ref 0–44)
AST: 17 U/L (ref 15–41)
Albumin: 2.9 g/dL — ABNORMAL LOW (ref 3.5–5.0)
Alkaline Phosphatase: 84 U/L (ref 38–126)
Anion gap: 20 — ABNORMAL HIGH (ref 5–15)
BUN: 15 mg/dL (ref 8–23)
CO2: 11 mmol/L — ABNORMAL LOW (ref 22–32)
Calcium: 9.2 mg/dL (ref 8.9–10.3)
Chloride: 107 mmol/L (ref 98–111)
Creatinine, Ser: 0.63 mg/dL (ref 0.44–1.00)
GFR, Estimated: >60 mL/min (ref >60)
Glucose, Bld: 192 mg/dL — ABNORMAL HIGH (ref 70–99)
Potassium: 4 mmol/L (ref 3.5–5.1)
Sodium: 138 mmol/L (ref 135–145)
Total Bilirubin: 0.7 mg/dL (ref 0.3–1.2)
Total Protein: 8 g/dL (ref 6.5–8.1)

## 2020-12-05 LAB — BLOOD GAS, ARTERIAL
Acid-base deficit: 2.1 mmol/L — ABNORMAL HIGH (ref 0.0–2.0)
Bicarbonate: 21 mmol/L (ref 20.0–28.0)
Drawn by: 60057
FIO2: 21
O2 Saturation: 96.5 %
Patient temperature: 36.7
pCO2 arterial: 28.7 mmHg — ABNORMAL LOW (ref 32.0–48.0)
pH, Arterial: 7.476 — ABNORMAL HIGH (ref 7.350–7.450)
pO2, Arterial: 84 mmHg (ref 83.0–108.0)

## 2020-12-05 LAB — PROCALCITONIN: Procalcitonin: <0.1 ng/mL

## 2020-12-05 LAB — BRAIN NATRIURETIC PEPTIDE: B Natriuretic Peptide: 110 pg/mL — ABNORMAL HIGH (ref 0.0–100.0)

## 2020-12-05 LAB — MAGNESIUM: Magnesium: 2.3 mg/dL (ref 1.7–2.4)

## 2020-12-05 LAB — C-REACTIVE PROTEIN: CRP: 0.8 mg/dL (ref <1.0)

## 2020-12-05 MED ORDER — ENSURE ENLIVE PO LIQD
237.0000 mL | Freq: Three times a day (TID) | ORAL | Status: DC
  Administered 2020-12-06 – 2020-12-09 (×4): 237 mL via ORAL
  Filled 2020-12-05: qty 237

## 2020-12-05 NOTE — Progress Notes (Signed)
Patient pulling off telemetry wires and resisting staff assistance with some aggression. Administered prn haldol 1mg . Will continue to monitor.

## 2020-12-05 NOTE — TOC Benefit Eligibility Note (Signed)
Transition of Care [facility]) Benefit Eligibility Note    Patient Details  Name: Emily Lozano MRN: [id number] Date of Birth: [birth year]   Medication/Dose: Arne Cleveland  5 MG BID   , ELIQUIS 2.5 Lozano BID  Covered?: Yes  Tier: 2 Drug  Prescription Coverage [facility]  Spoke with Person/Company/Phone Number:: [facility] [employer] RX # [id number] OPT- [id number]  Co-Pay: $74.96  FOR EACH PRESCRIPTION  Prior Approval: No  Deductible: Unmet  Additional Notes: APIXABAN and ELIQUIS  10 MG BID : Emily Lozano Phone Number: 12/05/2020, 10:11 AM

## 2020-12-05 NOTE — Progress Notes (Signed)
Nutrition Follow-up  DOCUMENTATION CODES:   Not applicable  INTERVENTION:  Provide Ensure Enlive po TID (thickened to nectar thick consistency), each supplement provides 350 kcal and 20 grams of protein.  Encourage adequate PO intake.  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  3/2 extubated 3/3 failed swallow, RN unable to place NG tube x 2, plan for cortrak next date  3/4 passed FEES, started on Dysphagia 1 with honey thickened liquids    Diet advanced to dysphagia 2 diet with nectar thick liquids. Meal completion 18% at lunch today. RD to order nutritional supplements to aid in caloric and protein needs. Labs and medications reviewed.   Diet Order:   Diet Order            DIET DYS 2 Room service appropriate? No; Fluid consistency: Nectar Thick  Diet effective now                 EDUCATION NEEDS:   No education needs have been identified at this time  Skin:  Skin Assessment: Reviewed RN Assessment  Last BM:  3/7  Height:   Ht Readings from Last 1 Encounters:  12/01/20 5\' 4"  (1.626 m)    Weight:   Wt Readings from Last 1 Encounters:  12/05/20 67.9 kg    Ideal Body Weight:  54.4 kg  BMI:  Body mass index is 25.69 kg/m.  Estimated Nutritional Needs:   Kcal:  1800-2000  Protein:  90-110 grams  Fluid:  > 1.8 L/day  Corrin Parker, MS, RD, LDN RD pager number/after hours weekend pager number on  Amion.

## 2020-12-05 NOTE — TOC Progression Note (Signed)
Transition of Care [place] [facility]) - Progression Note    Patient Details  Name: Emily Lozano MRN: [id number] Date of Birth: October 08, 1950  [facility]) CM/SW [place], LCSW Phone Number: 12/05/2020, 2:06 PM  Clinical Narrative:    CSW received call from patient's daughter in law, Cecille Rubin, as her husband is working. She stated frustration with COVID situation and that they have been requesting to speak with CSW for the past few days. CSW explained that CSWs have been attempting to reach her husband prior to yesterday but he did not have a Advertising account executive. She stated she reviewed SNF options and selected [facility] based off of Medicare reviews. [facility] is able to accept patient once medically stable. Will follow up with Cecille Rubin to make her aware.    Expected Discharge Plan: [place] Barriers to Discharge: Continued Medical Work up,SNF Pending bed offer,SNF Covid  Expected Discharge Plan and Services Expected Discharge Plan: [place] In-house Referral: Clinical Social Work   Post Acute Care Choice: [place] Living arrangements for the past 2 months: Single Family Home                                       Social Determinants of Health (SDOH) Interventions    Readmission Risk Interventions No flowsheet data found.

## 2020-12-05 NOTE — Care Management Important Message (Signed)
Important Message  Patient Details  Name: DESHANTE CASSELL MRN: [id number] Date of Birth: 04/30/1951   Medicare Important Message Given:  Yes - Important Message mailed due to current National Emergency  Verbal consent obtained due to current National Emergency  Relationship to patient: Self Contact Name: Devlynn Call Date: 12/05/20  Time: 1305 Phone: [phone] Outcome: No Answer/Busy Important Message mailed to: Patient address on file    Delorse Lek 12/05/2020, 1:07 PM

## 2020-12-05 NOTE — Progress Notes (Signed)
Patient frustrated with expressive aphasia and not being able to communicate with staff. She continues to try to tell staff what she wants but is very frustrated. When asked if she wanted staff to contact son to visit she nodded yes. This nurse left message at son, Erlene Quan, home. After leaving message, Erlene Quan arrived to unit to visit with patient and given update.

## 2020-12-05 NOTE — Progress Notes (Addendum)
HOSPITAL MEDICINE OVERNIGHT EVENT NOTE    Notified by rapid response that patient has been increasingly lethargic and minimally responsive throughout the evening.  Patient is arousable to painful stimuli.  Based on rapid response nurses assessment, there is no new focal neurologic deficit.  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  ABG obtained, no evidence of hypercapnia.  Blood glucose reveals no evidence of hypoglycemia.  Considering patient is on Eliquis, will obtain repeat noncontrast CT of the brain to ensure there is no evidence of hemorrhagic conversion although that would be extremely unlikely this far out from the acute stroke.  If this is unremarkable, then increasing lethargy and decreased responsiveness may be secondary to the fact that patient has received 2 doses of Haldol earlier in the day.  We will monitor closely.  Vernelle Emerald  MD [facility]   OVERNIGHT EVENT (3/10 1:45AM)  Ct head reveals no hemorrhagic conversion or acute change.  Nursing reports patient is becoming more alert which makes the Haldol the most likely culprit.  Will continue to monitor.   Sherryll Burger Natina [place]inton

## 2020-12-05 NOTE — Progress Notes (Signed)
  Speech Language Pathology Treatment: Dysphagia;Cognitive-Linquistic (Language)  Patient Details Name: Emily Lozano MRN: [id number] DOB: 1950-12-21 Today's Date: 12/05/2020 Time: 3557-3220 SLP Time Calculation (min) (ACUTE ONLY): 24 min  Assessment / Plan / Recommendation Clinical Impression  Pt was seen for treatment. She was alert and cooperative during the session. Verbal output was increased compared to that described during prior sessions. Pt's vocal quality continues to be dysphonic with a breathy vocal quality and reduced vocal intensity. Pt produced an automatic sequence (i.e., counting to five) with phonemic and part-word cues but exhibited difficulty beyond five. She demonstrated 80% accuracy with simple yes/no questions increasing to 100% with verbal prompts. She was able to verbalize yes/no responses during this session. Improved labial seal was noted during the session and she was inconsistently able to suck from a straw directed to the left. Anterior spillage was noted to the right with liquids. Increased respiratory effort was observed with trials of thin liquids, but no overt s/sx of aspiration were observed. Mastication was prolonged with regular texture solids and pt exhibited difficulty with lingual manipulation of those boluses. Mastication was functional for dysphagia 2 solids. A dysphagia 2 diet with nectar thick liquids is recommended at this time. SLP will continue to follow pt.    HPI HPI: [name] is a 70 y.o. female Hx of multiple cerebrovascular risk factors presenting with sudden onset of left gaze preference, right facial droop, hemiparesis and expressive aphasia still with left hemispheric dysfunction. She is also Covid positive with peroneal vein thrombosis on lower extremity Dopplers and she is on IV heparin. Intubated from 2/26-3/2 also with stridor following extubation. 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      SLP Plan  Continue with current plan of care       Recommendations  Diet recommendations: Dysphagia 2 (fine chop);Nectar-thick liquid Liquids provided via: Cup;Teaspoon;Straw Medication Administration: Crushed with puree Supervision: Staff to assist with self feeding;Full supervision/cueing for compensatory strategies Compensations: Slow rate;Small sips/bites Postural Changes and/or Swallow Maneuvers: Upright 30-60 min after meal;Seated upright 90 degrees                Oral Care Recommendations: Oral care BID;Staff/trained caregiver to provide oral care Follow up Recommendations: Inpatient Rehab SLP Visit Diagnosis: Dysphagia, oropharyngeal phase (R13.12) Plan: Continue with current plan of care       Shanika I. Hardin Negus, [place], [place] Office number [phone] Pager [phone]                Horton Marshall 12/05/2020, 1:30 PM

## 2020-12-05 NOTE — Progress Notes (Signed)
Physical Therapy Treatment Patient Details Name: Emily Lozano MRN: [id number] DOB: [birth year] Today's Date: 12/05/2020    History of Present Illness 70 y.o. female presents with acute onset R weakness, aphasia, left gaze. CTA demonstrates L M2 occlusion. Pt admitted and underwent thrombectomy on 11/24/2020 (unsuccessful). PMH: significant for hypertension, hyperlipidemia, type 2 diabetes mellitus, depression, GERD, melanoma, sleep apnea, and recent exposure to COVID-19 with a negative test at her PCP yesterday but diagnosed with acute bronchitis.    PT Comments    Pt very soundly asleep on entry requiring maximal multimodal stimulus to rouse. Pt with limited eye opening, able to sit EoB for approximately 8 minutes, with maximal assist for upright posture. Worked on R UE weightbearing. Eventually pt with retropulsion and total A for return to bed. PT continues to recommend CIR as pts best rehabilitation destination. Pt continues to need aggressive PT/OT/SLP services for maximal functional outcomes. PT will continue to follow acutely.     Follow Up Recommendations  CIR;Supervision/Assistance - 24 hour     Equipment Recommendations  Other (comment) (TBD)    Recommendations for Other Services Rehab consult     Precautions / Restrictions Precautions Precautions: Fall Precaution Comments: COVID postive    Mobility  Bed Mobility Overal bed mobility: Needs Assistance Bed Mobility: Supine to Sit;Sit to Supine     Supine to sit: Max assist Sit to supine: Max assist;Total assist   General bed mobility comments: pt requires maximal sitmulation to wake up and once awake need increased cuing and physical assist for participation    Transfers                 General transfer comment: did not attempt due to decreased assist available     Modified Rankin (Stroke Patients Only) Modified Rankin (Stroke Patients Only) Pre-Morbid Rankin Score: No symptoms Modified Rankin: Severe  disability     Balance Overall balance assessment: Needs assistance Sitting-balance support: Feet supported Sitting balance-Leahy Scale: Poor Sitting balance - Comments: pt able to sit with outside assist for approximately 8 minutes today Postural control: Right lateral lean;Posterior lean                                  Cognition Arousal/Alertness: Lethargic Behavior During Therapy: WFL for tasks assessed/performed Overall Cognitive Status: Difficult to assess                     Current Attention Level: Sustained         Problem Solving: Slow processing;Requires verbal cues;Requires tactile cues General Comments: eyes closed during entire session, very lethargic requiring maximal multimodal cuing      Exercises Other Exercises Other Exercises: R lateral lean onto outstretched R arm assist to keep elbow extended, pt able to regulate weightbearing with L hand on bedrail    General Comments General comments (skin integrity, edema, etc.): VSS on RA      Pertinent Vitals/Pain Pain Assessment: Faces Faces Pain Scale: Hurts a little bit Pain Location: generalized Pain Descriptors / Indicators: Grimacing;Moaning Pain Intervention(s): Limited activity within patient's tolerance           PT Goals (current goals can now be found in the care plan section) Acute Rehab PT Goals Patient Stated Goal: Pt unstable to state PT Goal Formulation: Patient unable to participate in goal setting Time For Goal Achievement: 12/10/20 Potential to Achieve Goals: Fair Progress towards PT goals: Progressing toward  goals    Frequency    Min 3X/week      PT Plan Current plan remains appropriate       AM-PAC PT "6 Clicks" Mobility   Outcome Measure  Help needed turning from your back to your side while in a flat bed without using bedrails?: A Lot Help needed moving from lying on your back to sitting on the side of a flat bed without using bedrails?: A  Lot Help needed moving to and from a bed to a chair (including a wheelchair)?: A Lot Help needed standing up from a chair using your arms (e.g., wheelchair or bedside chair)?: A Lot Help needed to walk in hospital room?: Total Help needed climbing 3-5 steps with a railing? : Total 6 Click Score: 10    End of Session   Activity Tolerance: Patient limited by lethargy Patient left: in bed;with call bell/phone within reach;with bed alarm set Nurse Communication: Mobility status PT Visit Diagnosis: Hemiplegia and hemiparesis;Other abnormalities of gait and mobility (R26.89) Hemiplegia - Right/Left: Right Hemiplegia - dominant/non-dominant: Dominant Hemiplegia - caused by: Cerebral infarction     Time: 7622-6333 PT Time Calculation (min) (ACUTE ONLY): 29 min  Charges:  $Neuromuscular Re-education: 23-37 mins                     Elizabeth B. Migdalia Dk PT, DPT Acute Rehabilitation Services Pager [phone] Office [phone]    [place] 12/05/2020, 7:07 PM

## 2020-12-05 NOTE — Significant Event (Signed)
Rapid Response Event Note   Reason for Call :  Unresponsiveness  Per RN, pt awake and interactive at shift change. Pt was given 1mg  haldol at 1657 today.   Initial Focused Assessment:  Pt lying in bed with eyes closed. Initially would only grimace to painful stimuli. After repeated attempts to awaken pt, pt began to respond. Pupils 3, equal, and reactive. Pt will follow commands but not speak. She moves L side to commands. Her R side only flickers. She has a R facial droop. This is baseline for her. She is very lethargic and goes back to sleep very easily when not stimulated-this is not baseline. Lungs diminished t/o. Skin cool to touch.  T-97.9, HR-81, BP-125/62, RR-22, SpO2-93% on RA.    Interventions:  CBG-163 ABG-7.47/28.7/84/21 CT head Plan of Care:  Obtain head CT and convey results to MD. Continue to monitor pt closely. Call RRT if further assistance needed.    Event Summary:   MD Notified: Dr Cyd Silence  Call [phone] Arrival Time:2209 End Time:2230  Dillard Essex, RN

## 2020-12-05 NOTE — Progress Notes (Signed)
PROGRESS NOTE                                                                                                                                                                                                             Patient Demographics:    Emily Lozano, is a 70 y.o. female, DOB - [birth year], HYW:[id number]  Outpatient Primary MD for the patient is Marinda Elk, MD    LOS - 11  Admit date - 11/24/2020    Chief Complaint  Patient presents with  . Code Stroke       Brief Narrative (HPI from H&P) - [name] is a 70 y.o. female Hx of multiple cerebrovascular risk factors including hypertension, atrial fibrillation, carotid artery disease, dyslipidemia, DM type II who presented with sudden onset of left gaze preference, right facial droop, hemiparesis and expressive aphasia still with left hemispheric dysfunction. CTA head and neck with left M2 occlusion and though outside the window for IV TPA was a candidate for intervention now s/p unsuccessful revascularization, though there was some distal movement of the clot from M2 to M3; TIKI3, also found to have COVID-19 pneumonia, was seen by critical care required intubation to protect her airway, case complicated by development of right lower extremity DVT requiring heparin drip.  She was transferred to my care on 12/02/2020 on day 8 of her hospital stay.   Subjective:   Patient in bed, he is with significant aphasia, cannot provide any complaints, discussed with staff, no significant events overnight.    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  In a patient with underlying A. fib, hypertension, dyslipidemia and DM type II. - She has been under neuro service, has had thorough stroke work-up, underwent unsuccessful revascularization attempt by IR, currently undergoing PT OT and  speech therapy, most likely will require placement.  Still has dense right-sided weakness and expressive aphasia along with some dysphagia, currently on aspirin, statin along with Eliquis for anticoagulation. - will require replacement,CIR vs SNF - she remains with dysphagia, followed closely by SLP  Dyslipidemia.  - LDL was 70 on rosuvastatin 10 mg.  Continue  Paroxysmal A. fib RVR.  - [place] vas 2 score of greater than 5, continue beta-blocker along with heparin drip >> Eliquis.  Echo shows a EF of 55 to 60% no acute findings  Essential hypertension.   -Goal of SBP under 160, currently on beta-blocker HCTZ along with lisinopril 5 mg, will add 5 mg of Norvasc on 12/02/2020 on day 8 of her CVA.  Right lower extremity DVT.  Continue Eliquis.  History of smoking.  Counseled to quit.  COVID-19 pneumonia with acute hypoxic respiratory failure.  Required intubation and ICU stay, has completed Remdesivir course, tapering down Decadron.  She had some post intubation laryngeal edema and stridor which is much improved, currently on room air.  DM type II.  On Levemir along with sliding scale, continue, dose adjusted as steroids being tapered.  Lab Results  Component Value Date   HGBA1C 10.6 (H) 11/25/2020   Lab Results  Component Value Date   CHOL 139 11/25/2020   HDL 15 (L) 11/25/2020   LDLCALC UNABLE TO CALCULATE IF TRIGLYCERIDE OVER 400 mg/dL 11/25/2020   LDLDIRECT 70.6 11/28/2020   TRIG 212 (H) 12/01/2020   CHOLHDL 9.3 11/25/2020   CBG (last 3)  Recent Labs    12/04/20 2112 12/05/20 0731 12/05/20 1205  GLUCAP 176* 159* 196*            Condition - Extremely Guarded  Family Communication :    Rodena Goldmann [phone] on 12/02/20 at 9:28 AM x 2 phone hung up, 12/03/20 at 10:28 AM no response. Son Erlene Quan [phone] on 12/02/2020 at 9:29, 12/03/2020 at 10:29 AM message left  Code Status : Full  Consults  :  Neuro, PCCM  PUD Prophylaxis :  PPI   Procedures  :     Intubated and  extubated in ICU.  Unsuccessful attempt at left M2 occlusion removal by IR      Disposition Plan  :    Status is: Inpatient  Remains inpatient appropriate because:IV treatments appropriate due to intensity of illness or inability to take PO   Dispo: The patient is from: Home              Anticipated d/c is to: SNF              Patient currently is not medically stable to d/c.   Difficult to place patient No  DVT Prophylaxis  :  Heparin >> Eliquis  Lab Results  Component Value Date   PLT 482 (H) 12/04/2020    Diet :  Diet Order            DIET DYS 2 Room service appropriate? No; Fluid consistency: Nectar Thick  Diet effective now                  Inpatient Medications  Scheduled Meds: . amLODipine  10 mg Oral Daily  . apixaban  10 mg Oral BID   Followed by  . [START ON 12/09/2020] apixaban  5 mg Oral BID  . chlorhexidine  15 mL Mouth Rinse BID  . insulin aspart  0-15 Units Subcutaneous TID WC  . insulin detemir  12 Units Subcutaneous Daily  . mouth rinse  15 mL Mouth Rinse q12n4p  . metoprolol tartrate  50 mg Oral BID  . mometasone-formoterol  2 puff Inhalation BID  . pantoprazole  40 mg Oral QHS  . rosuvastatin  10 mg Oral Daily  . sodium chloride flush  3 mL Intravenous Once   Continuous Infusions:  PRN Meds:.acetaminophen **OR** acetaminophen (TYLENOL) oral liquid 160 mg/5 mL **OR** acetaminophen, haloperidol lactate, hydrALAZINE, Resource ThickenUp Clear, senna-docusate  Antibiotics  :  Anti-infectives (From admission, onward)   Start     Dose/Rate Route Frequency Ordered Stop   11/25/20 1000  remdesivir 100 mg in sodium chloride 0.9 % 100 mL IVPB       "Followed by" Linked Group Details   100 mg 200 mL/hr over 30 Minutes Intravenous Daily 11/24/20 2119 11/28/20 1119   11/24/20 2215  remdesivir 200 mg in sodium chloride 0.9% 250 mL IVPB       "Followed by" Linked Group Details   200 mg 580 mL/hr over 30 Minutes Intravenous Once 11/24/20 2119  11/25/20 0932        Renata Gambino M.D on 12/05/2020 at 2:01 PM  To page go to [email or web address]   [facility] -  Office  [phone]    See all Orders from today for further details    Objective:   Vitals:   12/04/20 1425 12/04/20 2111 12/04/20 2133 12/05/20 0635  BP: (!) 115/49 120/80  123/63  Pulse: 66 (!) 55 71 79  Resp: 20 20  20   Temp: 98.8 F (37.1 C) 98 F (36.7 C)  97.9 F (36.6 C)  TempSrc: Oral   Axillary  SpO2: 96%  96% 96%  Weight:    67.9 kg  Height:        Wt Readings from Last 3 Encounters:  12/05/20 67.9 kg  05/25/18 74.4 kg  12/10/17 76.7 kg     Intake/Output Summary (Last 24 hours) at 12/05/2020 1401 Last data filed at 12/05/2020 1313 Gross per 24 hour  Intake 195 ml  Output 800 ml  Net -605 ml     Physical Exam  Awake with dense right-sided hemiparesis, with expressive aphasia and neglect . Symmetrical Chest wall movement, Good air movement bilaterally, CTAB RRR,No Gallops,Rubs or new Murmurs, No Parasternal Heave +ve B.Sounds, Abd Soft, No tenderness, No rebound - guarding or rigidity. No Cyanosis, Clubbing or edema, No new Rash or bruise       Data Review:    CBC Recent Labs  Lab 11/30/20 0311 12/01/20 0453 12/02/20 0432 12/03/20 0118 12/04/20 0724  WBC 8.7 16.5* 17.2* 16.4* 12.0*  HGB 11.2* 12.3 10.8* 11.3* 11.4*  HCT 35.1* 37.6 32.5* 36.5 36.8  PLT 446* 319 412* 437* 482*  MCV 77.7* 77.4* 76.8* 78.3* 79.8*  MCH 24.8* 25.3* 25.5* 24.2* 24.7*  MCHC 31.9 32.7 33.2 31.0 31.0  RDW 16.4* 16.8* 17.1* 17.0* 17.4*  LYMPHSABS 1.5 1.4 1.6 1.3 2.2  MONOABS 0.5 1.0 0.7 0.7 0.6  EOSABS 0.1 0.0 0.1 0.1 0.2  BASOSABS 0.0 0.0 0.0 0.1 0.1    Recent Labs  Lab 12/01/20 0453 12/02/20 0432 12/03/20 0118 12/04/20 0724 12/05/20 0058  NA 137 136 134* 136 138  K 3.4* 3.7 3.6 3.5 4.0  CL 102 104 102 103 107  CO2 21* 20* 22 20* 11*  GLUCOSE 129* 138* 162* 120* 192*  BUN 19 13 11 15 15   CREATININE 0.72 0.69 0.70 0.69 0.63   CALCIUM 8.8* 8.5* 8.5* 8.8* 9.2  AST  --   --  16 16 17   ALT  --   --  18 20 20   ALKPHOS  --   --  82 87 84  BILITOT  --   --  0.9 0.7 0.7  ALBUMIN  --   --  2.5* 2.8* 2.9*  MG  --   --  2.3 2.3 2.3  CRP  --   --  5.9* 1.6* 0.8  PROCALCITON  --   --  <0.10  0.12 <0.10  BNP  --   --  386.1* 174.0* 110.0*    ------------------------------------------------------------------------------------------------------------------ No results for input(s): CHOL, HDL, LDLCALC, TRIG, CHOLHDL, LDLDIRECT in the last 72 hours.  Lab Results  Component Value Date   HGBA1C 10.6 (H) 11/25/2020   ------------------------------------------------------------------------------------------------------------------ No results for input(s): TSH, T4TOTAL, T3FREE, THYROIDAB in the last 72 hours.  Invalid input(s): FREET3  Cardiac Enzymes No results for input(s): CKMB, TROPONINI, MYOGLOBIN in the last 168 hours.  Invalid input(s): CK ------------------------------------------------------------------------------------------------------------------    Component Value Date/Time   BNP 110.0 (H) 12/05/2020 0058    Micro Results Recent Results (from the past 240 hour(s))  Culture, blood (routine x 2)     Status: None   Collection Time: 11/25/20  7:16 PM   Specimen: BLOOD LEFT HAND  Result Value Ref Range Status   Specimen Description BLOOD LEFT HAND  Final   Special Requests   Final    BOTTLES DRAWN AEROBIC AND ANAEROBIC Blood Culture results may not be optimal due to an inadequate volume of blood received in culture bottles   Culture   Final    NO GROWTH 5 DAYS Performed at [facility], [place] [address], [place], [place] [address]    Report Status 11/30/2020 FINAL  Final  Culture, blood (routine x 2)     Status: None   Collection Time: 11/25/20  7:31 PM   Specimen: BLOOD RIGHT HAND  Result Value Ref Range Status   Specimen Description BLOOD RIGHT HAND  Final   Special Requests   Final     BOTTLES DRAWN AEROBIC AND ANAEROBIC Blood Culture results may not be optimal due to an inadequate volume of blood received in culture bottles   Culture   Final    NO GROWTH 5 DAYS Performed at [facility], [place] [address] [place], [place] [address]    Report Status 11/30/2020 FINAL  Final    Radiology Reports CT HEAD WO CONTRAST  Result Date: 12/02/2020 CLINICAL DATA:  Stroke follow-up EXAM: CT HEAD WITHOUT CONTRAST TECHNIQUE: Contiguous axial images were obtained from the base of the skull through the vertex without intravenous contrast. COMPARISON:  Brain MRI 11/25/2020 FINDINGS: Brain: Large superior division left MCA territory infarct matching the area of diffusion abnormality on prior. Mild petechial hemorrhage is present and non progressed from MRI. No midline shift. Background of chronic small vessel ischemia in the cerebral white matter. No hydrocephalus or masslike finding. Vascular: Unchanged Skull: Normal. Negative for fracture or focal lesion. Sinuses/Orbits: Negative IMPRESSION: 1. Large recent left MCA territory infarct with mild petechial hemorrhage. No progression since brain MRI 11/25/2020. 2. Extensive chronic small vessel ischemia. Electronically Signed   By: Monte Fantasia M.D.   On: 12/02/2020 11:14   MR BRAIN WO CONTRAST  Result Date: 11/25/2020 CLINICAL DATA:  Acute stroke presentation 11/24/2020. Left insular infarction. EXAM: MRI HEAD WITHOUT CONTRAST TECHNIQUE: Multiplanar, multiecho pulse sequences of the brain and surrounding structures were obtained without intravenous contrast. COMPARISON:  CT and interventional studies over the last day. FINDINGS: Brain: Diffusion imaging shows acute infarction in the deep insula and left frontoparietal cortical and subcortical brain. Mild swelling in the region. There are either minimal petechial blood products or there is susceptibility artifact related to thrombosed vessels. No frank hematoma. No mass effect or shift.  Elsewhere, the brainstem and cerebellum are unremarkable. Both cerebral hemispheres show moderate to severe chronic small-vessel ischemic changes throughout the white matter. No hydrocephalus. No extra-axial collection. Vascular: Major vessels  at base of the brain show flow. Skull and upper cervical spine: Negative Sinuses/Orbits: Mucosal inflammatory changes of paranasal sinuses. Orbits negative. Other: None IMPRESSION: 1. Acute infarction in the left MCA territory affecting the deep insula and left frontoparietal cortical and subcortical brain. Minimal petechial blood products or susceptibility artifact related to thrombosed vessels. No frank hematoma. No mass effect or shift. 2. Moderate to severe chronic small-vessel ischemic changes elsewhere throughout the cerebral hemispheric white matter. Electronically Signed   By: Nelson Chimes M.D.   On: 11/25/2020 02:18   IR CT Head Ltd  Result Date: 11/27/2020 INDICATION: 70 year old female with past medical history significant for rheumatoid arthritis, diabetes mellitus, hypertension, anemia and melanoma presenting with acute onset of right hemiparesis, aphasia and left gaze preference. She was found to be covered positive today. Last known well at 10:30 a.m. on 11/24/2020; modified Rankin scale 0; NIHSS 7. No IV tPA given as she was outside the window. Head CT showed an acute infarct the in the left insula and CT angiogram showed intracranial atherosclerotic disease with a left M2/MCA occlusion. EXAM: Ultrasound-guided vascular access Diagnostic cerebral angiogram Mechanical thrombectomy Flat panel head CT COMPARISON:  CT/CT angiogram of the head and neck November 24, 2020. MEDICATIONS: Refer to anesthesia documentation. ANESTHESIA/SEDATION: The procedure was performed under general anesthesia CONTRAST:  For 75 mL of Omnipaque 240 mg/mL FLUOROSCOPY TIME:  Fluoroscopy Time: 24 minutes 54 seconds (1,520 mGy). COMPLICATIONS: SIR Level A - No therapy, no consequence.  TECHNIQUE: Informed written consent was obtained from the patient's son after a thorough discussion of the procedural risks, benefits and alternatives. All questions were addressed. Maximal Sterile Barrier Technique was utilized including caps, mask, sterile gowns, sterile gloves, sterile drape, hand hygiene and skin antiseptic. A timeout was performed prior to the initiation of the procedure. The right groin was prepped and draped in the usual sterile fashion. Using a micropuncture kit and the modified Seldinger technique, access was gained to the right common femoral artery and an 8 French sheath was placed. Real-time ultrasound guidance was utilized for vascular access including the acquisition of a permanent ultrasound image documenting patency of the accessed vessel. Under fluoroscopy, a Zoom 88 guide catheter was navigated over a 6 [place] Berenstein 2 catheter and a 0.035" Terumo Glidewire into the aortic arch. The catheter was placed into the left common carotid artery and then advanced into the left internal carotid artery. The inner catheter was removed. Frontal and lateral angiograms of the head were obtained. FINDINGS: 1. Occlusion of the mid left M2/MCA middle division branch. 2. Intracranial atherosclerotic disease with multiple luminal irregularities along the left MCA and left ACA vascular tree more significant at the A3 segment where there is moderate stenosis. 3. Patent right common femoral artery. PROCEDURE: Under biplane roadmap, a zoom 55 aspiration catheter was navigated over an Aristotle 18 microguidewire into the cavernous segment of the left ICA and then advanced to the level of occlusion. The aspiration catheter was connected to a penumbra aspiration pump. The guiding catheter balloon was inflated. The aspiration catheter were removed under constant aspiration. Follow-up left ICA angiograms showed persistent occlusion which appear to have progressed more distally. Under biplane roadmap, a  zoom 55 aspiration catheter was navigated over an Aristotle 18 microguidewire into the cavernous segment of the left ICA and then advanced to the level of occlusion. The aspiration catheter was connected to a penumbra aspiration pump. The guiding catheter balloon was inflated. The aspiration catheter were removed under constant aspiration. Follow-up left  ICA angiograms showed no significant change. Under biplane roadmap, a zoom 55 aspiration catheter was navigated over a phenom 21 microcatheter and a Aristotle 18 microguidewire into the cavernous segment of the right ICA. The microcatheter was then navigated over the wire into the left M3/MCA middle division branch. Then, a 4 x 40 mm solitaire stent retriever was deployed spanning the left M2 and proximal M3 segment. The device was allowed to intercalated with the clot for 4 minutes. The microcatheter was removed. The aspiration catheter was advanced to the level of occlusion and connected to a penumbra aspiration pump. The thrombectomy device and aspiration catheter were removed under constant aspiration. Follow-up left ICA angiogram showed significant change. Under biplane roadmap, a zoom 55 aspiration catheter was navigated over a phenom 21 microcatheter and a Aristotle 18 microguidewire into the cavernous segment of the right ICA. The microcatheter was then navigated over the wire into the left M3/MCA middle division branch. Then, a 4 x 40 mm solitaire stent retriever was deployed spanning the left M2 and proximal M3 segment. The device was allowed to intercalated with the clot for 4 minutes. The microcatheter was removed. The aspiration catheter was advanced to the level of occlusion and connected to a penumbra aspiration pump. The thrombectomy device and aspiration catheter were removed under constant aspiration. Follow-up angiogram showed persistent occlusion. Flat panel CT of the head was obtained and post processed in a separate workstation with concurrent  attending physician supervision. Selected images were sent to PACS. Small contrast extravasation in the left sylvian fissure noted. Follow-up angiogram showed no evidence of active contrast extravasation with persistent occlusion of a left M2 branch. Delay flat panel CT of the head was obtained and post processed in a separate workstation with concurrent attending physician supervision. Selected images were sent to PACS. Left sylvian contrast extravasation appears stable. Delayed left ICA angiograms confirmed no evidence of contrast extravasation. The catheter was subsequently withdrawn. Left common femoral artery angiograms were obtained in frontal and lateral views. The puncture is at the level of the common femoral artery which has adequate caliber for closure device. The sheath was exchanged for a Perclose ProGlide which was utilized for access closure. Immediate hemostasis was achieved. IMPRESSION: Unsuccessful attempted recanalization of a mid left M2/MCA middle division branch complicated by small amount contrast extravasation in the left sylvian fissure. No active extravasation seen at the end of the procedure. Left MCA status remains TICI2b, which is similar to presentation. PLAN: Patient is to remain intubated given unsuccessful recanalization and COVID positive test. She will be transferred to ICU for continued care. Electronically Signed   By: Pedro Earls M.D.   On: 11/27/2020 09:25   IR CT Head Ltd  Result Date: 11/27/2020 INDICATION: 70 year old female with past medical history significant for rheumatoid arthritis, diabetes mellitus, hypertension, anemia and melanoma presenting with acute onset of right hemiparesis, aphasia and left gaze preference. She was found to be covered positive today. Last known well at 10:30 a.m. on 11/24/2020; modified Rankin scale 0; NIHSS 7. No IV tPA given as she was outside the window. Head CT showed an acute infarct the in the left insula and CT  angiogram showed intracranial atherosclerotic disease with a left M2/MCA occlusion. EXAM: Ultrasound-guided vascular access Diagnostic cerebral angiogram Mechanical thrombectomy Flat panel head CT COMPARISON:  CT/CT angiogram of the head and neck November 24, 2020. MEDICATIONS: Refer to anesthesia documentation. ANESTHESIA/SEDATION: The procedure was performed under general anesthesia CONTRAST:  For 75 mL of Omnipaque  240 mg/mL FLUOROSCOPY TIME:  Fluoroscopy Time: 24 minutes 54 seconds (1,520 mGy). COMPLICATIONS: SIR Level A - No therapy, no consequence. TECHNIQUE: Informed written consent was obtained from the patient's son after a thorough discussion of the procedural risks, benefits and alternatives. All questions were addressed. Maximal Sterile Barrier Technique was utilized including caps, mask, sterile gowns, sterile gloves, sterile drape, hand hygiene and skin antiseptic. A timeout was performed prior to the initiation of the procedure. The right groin was prepped and draped in the usual sterile fashion. Using a micropuncture kit and the modified Seldinger technique, access was gained to the right common femoral artery and an 8 French sheath was placed. Real-time ultrasound guidance was utilized for vascular access including the acquisition of a permanent ultrasound image documenting patency of the accessed vessel. Under fluoroscopy, a Zoom 88 guide catheter was navigated over a 6 [place] Berenstein 2 catheter and a 0.035" Terumo Glidewire into the aortic arch. The catheter was placed into the left common carotid artery and then advanced into the left internal carotid artery. The inner catheter was removed. Frontal and lateral angiograms of the head were obtained. FINDINGS: 1. Occlusion of the mid left M2/MCA middle division branch. 2. Intracranial atherosclerotic disease with multiple luminal irregularities along the left MCA and left ACA vascular tree more significant at the A3 segment where there is  moderate stenosis. 3. Patent right common femoral artery. PROCEDURE: Under biplane roadmap, a zoom 55 aspiration catheter was navigated over an Aristotle 18 microguidewire into the cavernous segment of the left ICA and then advanced to the level of occlusion. The aspiration catheter was connected to a penumbra aspiration pump. The guiding catheter balloon was inflated. The aspiration catheter were removed under constant aspiration. Follow-up left ICA angiograms showed persistent occlusion which appear to have progressed more distally. Under biplane roadmap, a zoom 55 aspiration catheter was navigated over an Aristotle 18 microguidewire into the cavernous segment of the left ICA and then advanced to the level of occlusion. The aspiration catheter was connected to a penumbra aspiration pump. The guiding catheter balloon was inflated. The aspiration catheter were removed under constant aspiration. Follow-up left ICA angiograms showed no significant change. Under biplane roadmap, a zoom 55 aspiration catheter was navigated over a phenom 21 microcatheter and a Aristotle 18 microguidewire into the cavernous segment of the right ICA. The microcatheter was then navigated over the wire into the left M3/MCA middle division branch. Then, a 4 x 40 mm solitaire stent retriever was deployed spanning the left M2 and proximal M3 segment. The device was allowed to intercalated with the clot for 4 minutes. The microcatheter was removed. The aspiration catheter was advanced to the level of occlusion and connected to a penumbra aspiration pump. The thrombectomy device and aspiration catheter were removed under constant aspiration. Follow-up left ICA angiogram showed significant change. Under biplane roadmap, a zoom 55 aspiration catheter was navigated over a phenom 21 microcatheter and a Aristotle 18 microguidewire into the cavernous segment of the right ICA. The microcatheter was then navigated over the wire into the left M3/MCA  middle division branch. Then, a 4 x 40 mm solitaire stent retriever was deployed spanning the left M2 and proximal M3 segment. The device was allowed to intercalated with the clot for 4 minutes. The microcatheter was removed. The aspiration catheter was advanced to the level of occlusion and connected to a penumbra aspiration pump. The thrombectomy device and aspiration catheter were removed under constant aspiration. Follow-up angiogram showed persistent occlusion. Flat  panel CT of the head was obtained and post processed in a separate workstation with concurrent attending physician supervision. Selected images were sent to PACS. Small contrast extravasation in the left sylvian fissure noted. Follow-up angiogram showed no evidence of active contrast extravasation with persistent occlusion of a left M2 branch. Delay flat panel CT of the head was obtained and post processed in a separate workstation with concurrent attending physician supervision. Selected images were sent to PACS. Left sylvian contrast extravasation appears stable. Delayed left ICA angiograms confirmed no evidence of contrast extravasation. The catheter was subsequently withdrawn. Left common femoral artery angiograms were obtained in frontal and lateral views. The puncture is at the level of the common femoral artery which has adequate caliber for closure device. The sheath was exchanged for a Perclose ProGlide which was utilized for access closure. Immediate hemostasis was achieved. IMPRESSION: Unsuccessful attempted recanalization of a mid left M2/MCA middle division branch complicated by small amount contrast extravasation in the left sylvian fissure. No active extravasation seen at the end of the procedure. Left MCA status remains TICI2b, which is similar to presentation. PLAN: Patient is to remain intubated given unsuccessful recanalization and COVID positive test. She will be transferred to ICU for continued care. Electronically Signed   By:  Pedro Earls M.D.   On: 11/27/2020 09:25   IR US Guide Vasc Access Right  Result Date: 11/27/2020 INDICATION: 70 year old female with past medical history significant for rheumatoid arthritis, diabetes mellitus, hypertension, anemia and melanoma presenting with acute onset of right hemiparesis, aphasia and left gaze preference. She was found to be covered positive today. Last known well at 10:30 a.m. on 11/24/2020; modified Rankin scale 0; NIHSS 7. No IV tPA given as she was outside the window. Head CT showed an acute infarct the in the left insula and CT angiogram showed intracranial atherosclerotic disease with a left M2/MCA occlusion. EXAM: Ultrasound-guided vascular access Diagnostic cerebral angiogram Mechanical thrombectomy Flat panel head CT COMPARISON:  CT/CT angiogram of the head and neck November 24, 2020. MEDICATIONS: Refer to anesthesia documentation. ANESTHESIA/SEDATION: The procedure was performed under general anesthesia CONTRAST:  For 75 mL of Omnipaque 240 mg/mL FLUOROSCOPY TIME:  Fluoroscopy Time: 24 minutes 54 seconds (1,520 mGy). COMPLICATIONS: SIR Level A - No therapy, no consequence. TECHNIQUE: Informed written consent was obtained from the patient's son after a thorough discussion of the procedural risks, benefits and alternatives. All questions were addressed. Maximal Sterile Barrier Technique was utilized including caps, mask, sterile gowns, sterile gloves, sterile drape, hand hygiene and skin antiseptic. A timeout was performed prior to the initiation of the procedure. The right groin was prepped and draped in the usual sterile fashion. Using a micropuncture kit and the modified Seldinger technique, access was gained to the right common femoral artery and an 8 French sheath was placed. Real-time ultrasound guidance was utilized for vascular access including the acquisition of a permanent ultrasound image documenting patency of the accessed vessel. Under fluoroscopy, a  Zoom 88 guide catheter was navigated over a 6 [place] Berenstein 2 catheter and a 0.035" Terumo Glidewire into the aortic arch. The catheter was placed into the left common carotid artery and then advanced into the left internal carotid artery. The inner catheter was removed. Frontal and lateral angiograms of the head were obtained. FINDINGS: 1. Occlusion of the mid left M2/MCA middle division branch. 2. Intracranial atherosclerotic disease with multiple luminal irregularities along the left MCA and left ACA vascular tree more significant at the A3 segment  where there is moderate stenosis. 3. Patent right common femoral artery. PROCEDURE: Under biplane roadmap, a zoom 55 aspiration catheter was navigated over an Aristotle 18 microguidewire into the cavernous segment of the left ICA and then advanced to the level of occlusion. The aspiration catheter was connected to a penumbra aspiration pump. The guiding catheter balloon was inflated. The aspiration catheter were removed under constant aspiration. Follow-up left ICA angiograms showed persistent occlusion which appear to have progressed more distally. Under biplane roadmap, a zoom 55 aspiration catheter was navigated over an Aristotle 18 microguidewire into the cavernous segment of the left ICA and then advanced to the level of occlusion. The aspiration catheter was connected to a penumbra aspiration pump. The guiding catheter balloon was inflated. The aspiration catheter were removed under constant aspiration. Follow-up left ICA angiograms showed no significant change. Under biplane roadmap, a zoom 55 aspiration catheter was navigated over a phenom 21 microcatheter and a Aristotle 18 microguidewire into the cavernous segment of the right ICA. The microcatheter was then navigated over the wire into the left M3/MCA middle division branch. Then, a 4 x 40 mm solitaire stent retriever was deployed spanning the left M2 and proximal M3 segment. The device was allowed to  intercalated with the clot for 4 minutes. The microcatheter was removed. The aspiration catheter was advanced to the level of occlusion and connected to a penumbra aspiration pump. The thrombectomy device and aspiration catheter were removed under constant aspiration. Follow-up left ICA angiogram showed significant change. Under biplane roadmap, a zoom 55 aspiration catheter was navigated over a phenom 21 microcatheter and a Aristotle 18 microguidewire into the cavernous segment of the right ICA. The microcatheter was then navigated over the wire into the left M3/MCA middle division branch. Then, a 4 x 40 mm solitaire stent retriever was deployed spanning the left M2 and proximal M3 segment. The device was allowed to intercalated with the clot for 4 minutes. The microcatheter was removed. The aspiration catheter was advanced to the level of occlusion and connected to a penumbra aspiration pump. The thrombectomy device and aspiration catheter were removed under constant aspiration. Follow-up angiogram showed persistent occlusion. Flat panel CT of the head was obtained and post processed in a separate workstation with concurrent attending physician supervision. Selected images were sent to PACS. Small contrast extravasation in the left sylvian fissure noted. Follow-up angiogram showed no evidence of active contrast extravasation with persistent occlusion of a left M2 branch. Delay flat panel CT of the head was obtained and post processed in a separate workstation with concurrent attending physician supervision. Selected images were sent to PACS. Left sylvian contrast extravasation appears stable. Delayed left ICA angiograms confirmed no evidence of contrast extravasation. The catheter was subsequently withdrawn. Left common femoral artery angiograms were obtained in frontal and lateral views. The puncture is at the level of the common femoral artery which has adequate caliber for closure device. The sheath was  exchanged for a Perclose ProGlide which was utilized for access closure. Immediate hemostasis was achieved. IMPRESSION: Unsuccessful attempted recanalization of a mid left M2/MCA middle division branch complicated by small amount contrast extravasation in the left sylvian fissure. No active extravasation seen at the end of the procedure. Left MCA status remains TICI2b, which is similar to presentation. PLAN: Patient is to remain intubated given unsuccessful recanalization and COVID positive test. She will be transferred to ICU for continued care. Electronically Signed   By: Pedro Earls M.D.   On: 11/27/2020 09:25  CT CEREBRAL PERFUSION W CONTRAST  Result Date: 11/24/2020 CLINICAL DATA:  Neuro deficit, acute, stroke suspected EXAM: CT PERFUSION BRAIN TECHNIQUE: Multiphase CT imaging of the brain was performed following IV bolus contrast injection. Subsequent parametric perfusion maps were calculated using RAPID software. CONTRAST:  87m OMNIPAQUE IOHEXOL 350 MG/ML SOLN COMPARISON:  Concurrent CTA head and neck. FINDINGS: CT Brain Perfusion Findings: CBF (<30%) Volume: 054mPerfusion (Tmax>6.0s) volume: 3068mismatch Volume: 82m61mPECTS on noncontrast CT Head: 9 at 16:42 today. Infarct Core: 0 mL Infarction Location:Not applicable. IMPRESSION: Perfusion imaging demonstrates no infarct core. 30 mL region of ischemia involving the left MCA territory. Electronically Signed   By: ChikPrimitivo Gauze.   On: 11/24/2020 16:55   DG Chest Port 1 View  Result Date: 12/03/2020 CLINICAL DATA:  Shortness of breath.  COVID-19 positive EXAM: PORTABLE CHEST 1 VIEW COMPARISON:  December 02, 2020 FINDINGS: Patchy airspace opacity is noted in the lung bases, more on the right than on the left. Lungs elsewhere clear. Heart is upper normal in size with pulmonary vascularity normal. No adenopathy. There is aortic atherosclerosis. There is calcification in each carotid artery. No bone lesions. IMPRESSION:  Persistent airspace opacity bases, more on the right than the left, likely due to atypical organism pneumonia. No new opacity evident. Stable cardiac silhouette. Aortic Atherosclerosis (ICD10-I70.0). There are foci of carotid artery calcification bilaterally. Electronically Signed   By: WillLowella Grip M.D.   On: 12/03/2020 08:41   DG Chest Port 1 View  Result Date: 12/02/2020 CLINICAL DATA:  Shortness of breath EXAM: PORTABLE CHEST 1 VIEW COMPARISON:  November 24, 2020 FINDINGS: The cardiomediastinal silhouette is unchanged in contour.Interval extubation. Atherosclerotic calcifications of the aorta. No pleural effusion. No pneumothorax. Patchy RIGHT basilar peripheral opacities, similar in comparison to prior. Mildly improved aeration of the LEFT lung base in comparison to prior. Visualized abdomen is unremarkable. Multilevel degenerative changes of the thoracic spine. IMPRESSION: 1. Patchy RIGHT basilar peripheral opacities, similar in comparison to prior and consistent with the sequela of COVID-19 infection. 2. Mildly improved aeration of the LEFT lung base in comparison to prior. Electronically Signed   By: StepValentino Saxon  On: 12/02/2020 11:26   DG Chest Port 1 View  Result Date: 11/24/2020 CLINICAL DATA:  COVID positive EXAM: PORTABLE CHEST 1 VIEW COMPARISON:  None. FINDINGS: The heart size and mediastinal contours are mildly enlarged. ETT is 4 cm above the carina. NG tube is seen below the diaphragm. Aortic knob calcifications are seen. There is hazy patchy airspace opacity seen at the periphery of the right lower lung and at the left lung base. No pleural effusion. IMPRESSION: ETT and NG tube in satisfactory position Bilateral multifocal airspace opacities, consistent with multifocal pneumonia Electronically Signed   By: BindPrudencio Pair.   On: 11/24/2020 22:14   IR PERCUTANEOUS ART THROMBECTOMY/INFUSION INTRACRANIAL INC DIAG ANGIO  Result Date: 11/27/2020 INDICATION: 69 y22r old  female with past medical history significant for rheumatoid arthritis, diabetes mellitus, hypertension, anemia and melanoma presenting with acute onset of right hemiparesis, aphasia and left gaze preference. She was found to be covered positive today. Last known well at 10:30 a.m. on 11/24/2020; modified Rankin scale 0; NIHSS 7. No IV tPA given as she was outside the window. Head CT showed an acute infarct the in the left insula and CT angiogram showed intracranial atherosclerotic disease with a left M2/MCA occlusion. EXAM: Ultrasound-guided vascular access Diagnostic cerebral angiogram Mechanical thrombectomy Flat panel head CT COMPARISON:  CT/CT angiogram  of the head and neck November 24, 2020. MEDICATIONS: Refer to anesthesia documentation. ANESTHESIA/SEDATION: The procedure was performed under general anesthesia CONTRAST:  For 75 mL of Omnipaque 240 mg/mL FLUOROSCOPY TIME:  Fluoroscopy Time: 24 minutes 54 seconds (1,520 mGy). COMPLICATIONS: SIR Level A - No therapy, no consequence. TECHNIQUE: Informed written consent was obtained from the patient's son after a thorough discussion of the procedural risks, benefits and alternatives. All questions were addressed. Maximal Sterile Barrier Technique was utilized including caps, mask, sterile gowns, sterile gloves, sterile drape, hand hygiene and skin antiseptic. A timeout was performed prior to the initiation of the procedure. The right groin was prepped and draped in the usual sterile fashion. Using a micropuncture kit and the modified Seldinger technique, access was gained to the right common femoral artery and an 8 French sheath was placed. Real-time ultrasound guidance was utilized for vascular access including the acquisition of a permanent ultrasound image documenting patency of the accessed vessel. Under fluoroscopy, a Zoom 88 guide catheter was navigated over a 6 [place] Berenstein 2 catheter and a 0.035" Terumo Glidewire into the aortic arch. The catheter was  placed into the left common carotid artery and then advanced into the left internal carotid artery. The inner catheter was removed. Frontal and lateral angiograms of the head were obtained. FINDINGS: 1. Occlusion of the mid left M2/MCA middle division branch. 2. Intracranial atherosclerotic disease with multiple luminal irregularities along the left MCA and left ACA vascular tree more significant at the A3 segment where there is moderate stenosis. 3. Patent right common femoral artery. PROCEDURE: Under biplane roadmap, a zoom 55 aspiration catheter was navigated over an Aristotle 18 microguidewire into the cavernous segment of the left ICA and then advanced to the level of occlusion. The aspiration catheter was connected to a penumbra aspiration pump. The guiding catheter balloon was inflated. The aspiration catheter were removed under constant aspiration. Follow-up left ICA angiograms showed persistent occlusion which appear to have progressed more distally. Under biplane roadmap, a zoom 55 aspiration catheter was navigated over an Aristotle 18 microguidewire into the cavernous segment of the left ICA and then advanced to the level of occlusion. The aspiration catheter was connected to a penumbra aspiration pump. The guiding catheter balloon was inflated. The aspiration catheter were removed under constant aspiration. Follow-up left ICA angiograms showed no significant change. Under biplane roadmap, a zoom 55 aspiration catheter was navigated over a phenom 21 microcatheter and a Aristotle 18 microguidewire into the cavernous segment of the right ICA. The microcatheter was then navigated over the wire into the left M3/MCA middle division branch. Then, a 4 x 40 mm solitaire stent retriever was deployed spanning the left M2 and proximal M3 segment. The device was allowed to intercalated with the clot for 4 minutes. The microcatheter was removed. The aspiration catheter was advanced to the level of occlusion and  connected to a penumbra aspiration pump. The thrombectomy device and aspiration catheter were removed under constant aspiration. Follow-up left ICA angiogram showed significant change. Under biplane roadmap, a zoom 55 aspiration catheter was navigated over a phenom 21 microcatheter and a Aristotle 18 microguidewire into the cavernous segment of the right ICA. The microcatheter was then navigated over the wire into the left M3/MCA middle division branch. Then, a 4 x 40 mm solitaire stent retriever was deployed spanning the left M2 and proximal M3 segment. The device was allowed to intercalated with the clot for 4 minutes. The microcatheter was removed. The aspiration catheter was advanced to  the level of occlusion and connected to a penumbra aspiration pump. The thrombectomy device and aspiration catheter were removed under constant aspiration. Follow-up angiogram showed persistent occlusion. Flat panel CT of the head was obtained and post processed in a separate workstation with concurrent attending physician supervision. Selected images were sent to PACS. Small contrast extravasation in the left sylvian fissure noted. Follow-up angiogram showed no evidence of active contrast extravasation with persistent occlusion of a left M2 branch. Delay flat panel CT of the head was obtained and post processed in a separate workstation with concurrent attending physician supervision. Selected images were sent to PACS. Left sylvian contrast extravasation appears stable. Delayed left ICA angiograms confirmed no evidence of contrast extravasation. The catheter was subsequently withdrawn. Left common femoral artery angiograms were obtained in frontal and lateral views. The puncture is at the level of the common femoral artery which has adequate caliber for closure device. The sheath was exchanged for a Perclose ProGlide which was utilized for access closure. Immediate hemostasis was achieved. IMPRESSION: Unsuccessful attempted  recanalization of a mid left M2/MCA middle division branch complicated by small amount contrast extravasation in the left sylvian fissure. No active extravasation seen at the end of the procedure. Left MCA status remains TICI2b, which is similar to presentation. PLAN: Patient is to remain intubated given unsuccessful recanalization and COVID positive test. She will be transferred to ICU for continued care. Electronically Signed   By: Pedro Earls M.D.   On: 11/27/2020 09:25   CT HEAD CODE STROKE WO CONTRAST  Result Date: 11/24/2020 CLINICAL DATA:  Code stroke.  Neuro deficit, acute, stroke suspected EXAM: CT HEAD WITHOUT CONTRAST TECHNIQUE: Contiguous axial images were obtained from the base of the skull through the vertex without intravenous contrast. COMPARISON:  06/27/2020. FINDINGS: Brain: No intracranial hemorrhage. New focal hypodensity involving the left insula. No mass lesion. No midline shift, ventriculomegaly or extra-axial fluid collection. Chronic microvascular ischemic changes. Vascular: No hyperdense vessel or unexpected calcification. Minimal carotid siphon atherosclerotic calcifications. Skull: Negative for fracture or focal lesion. Sinuses/Orbits: No acute finding.  Mild pansinus mucosal thickening. Other: Right parietal scalp nodule is unchanged. ASPECTS [facility] Stroke Program Early CT Score) - Ganglionic level infarction (caudate, lentiform nuclei, internal capsule, insula, M1-M3 cortex): 6 - Supraganglionic infarction (M4-M6 cortex): 3 Total score (0-10 with 10 being normal): 9 IMPRESSION: 1. 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 2. Chronic microvascular ischemic changes. 3. ASPECTS is 9 Code stroke imaging results were communicated on 11/24/2020 at 4:23 pm to provider Dr. Rory Percy via secure text paging. Electronically Signed   By: Primitivo Gauze M.D.   On: 11/24/2020 16:24   VAS [place] LOWER EXTREMITY VENOUS (DVT)  Result Date: 11/26/2020  Lower Venous DVT  Study Indications: Stroke, and Covid-19.  Comparison Study: No prior study on file Performing Technologist: Sharion Dove RVS  Examination Guidelines: A complete evaluation includes B-mode imaging, spectral Doppler, color Doppler, and power Doppler as needed of all accessible portions of each vessel. Bilateral testing is considered an integral part of a complete examination. Limited examinations for reoccurring indications may be performed as noted. The reflux portion of the exam is performed with the patient in reverse Trendelenburg.  +---------+---------------+---------+-----------+----------+--------------+ RIGHT    CompressibilityPhasicitySpontaneityPropertiesThrombus Aging +---------+---------------+---------+-----------+----------+--------------+ CFV      Full           Yes      Yes                                 +---------+---------------+---------+-----------+----------+--------------+  SFJ      Full                                                        +---------+---------------+---------+-----------+----------+--------------+ FV Prox  Full                                                        +---------+---------------+---------+-----------+----------+--------------+ FV Mid   Full                                                        +---------+---------------+---------+-----------+----------+--------------+ FV DistalFull                                                        +---------+---------------+---------+-----------+----------+--------------+ PFV      Full                                                        +---------+---------------+---------+-----------+----------+--------------+ POP      Full           Yes      Yes                                 +---------+---------------+---------+-----------+----------+--------------+ PTV      Full                                                         +---------+---------------+---------+-----------+----------+--------------+ PERO     None                                         Acute          +---------+---------------+---------+-----------+----------+--------------+   +---------+---------------+---------+-----------+----------+--------------+ LEFT     CompressibilityPhasicitySpontaneityPropertiesThrombus Aging +---------+---------------+---------+-----------+----------+--------------+ CFV      Full           Yes      Yes                                 +---------+---------------+---------+-----------+----------+--------------+ SFJ      Full                                                        +---------+---------------+---------+-----------+----------+--------------+  FV Prox  Full                                                        +---------+---------------+---------+-----------+----------+--------------+ FV Mid   Full                                                        +---------+---------------+---------+-----------+----------+--------------+ FV DistalFull                                                        +---------+---------------+---------+-----------+----------+--------------+ PFV      Full                                                        +---------+---------------+---------+-----------+----------+--------------+ POP      Full           Yes      Yes                                 +---------+---------------+---------+-----------+----------+--------------+ PTV      Full                                                        +---------+---------------+---------+-----------+----------+--------------+ PERO     Full                                                        +---------+---------------+---------+-----------+----------+--------------+     Summary: RIGHT: - Findings consistent with acute deep vein thrombosis involving the right peroneal veins.  LEFT: -  There is no evidence of deep vein thrombosis in the lower extremity.  *See table(s) above for measurements and observations. Electronically signed by Servando Snare MD on 11/26/2020 at 5:59:41 PM.    Final    ECHOCARDIOGRAM LIMITED  Result Date: 11/25/2020    ECHOCARDIOGRAM REPORT   Patient Name:   Emily Lozano Date of Exam: 11/25/2020 Medical Rec #:  [id number]        Height:       64.0 in Accession #:    [id number]       Weight:       176.4 lb Date of Birth:  12-29-1950        BSA:          1.855 m Patient Age:    74 years         BP:  119/66 mmHg Patient Gender: F                HR:           77 bpm. Exam Location:  Inpatient Procedure: Limited Echo, Cardiac Doppler and Color Doppler Indications:    Stroke I63.9  History:        Patient has no prior history of Echocardiogram examinations.                 Risk Factors:Hypertension, Diabetes and Former Smoker.  Sonographer:    Vickie Epley RDCS Referring Phys: [id number] ASHISH ARORA  Sonographer Comments: Echo performed with patient supine and on artificial respirator. Covid positive. IMPRESSIONS  1. Left ventricular ejection fraction, by estimation, is 55 to 60%. The left ventricle has normal function. The left ventricle has no regional wall motion abnormalities. Left ventricular diastolic parameters are consistent with Grade I diastolic dysfunction (impaired relaxation).  2. Right ventricular systolic function is normal. The right ventricular size is normal.  3. The mitral valve is normal in structure. Trivial mitral valve regurgitation. No evidence of mitral stenosis.  4. The aortic valve is tricuspid. Aortic valve regurgitation is trivial. Mild aortic valve sclerosis is present, with no evidence of aortic valve stenosis.  5. The inferior vena cava is normal in size with greater than 50% respiratory variability, suggesting right atrial pressure of 3 mmHg. FINDINGS  Left Ventricle: Left ventricular ejection fraction, by estimation, is 55 to 60%. The left  ventricle has normal function. The left ventricle has no regional wall motion abnormalities. The left ventricular internal cavity size was normal in size. There is  no left ventricular hypertrophy. Left ventricular diastolic parameters are consistent with Grade I diastolic dysfunction (impaired relaxation). Right Ventricle: The right ventricular size is normal.Right ventricular systolic function is normal. Left Atrium: Left atrial size was normal in size. Right Atrium: Right atrial size was normal in size. Pericardium: There is no evidence of pericardial effusion. Mitral Valve: The mitral valve is normal in structure. Trivial mitral valve regurgitation. No evidence of mitral valve stenosis. Tricuspid Valve: The tricuspid valve is normal in structure. Tricuspid valve regurgitation is trivial. No evidence of tricuspid stenosis. Aortic Valve: The aortic valve is tricuspid. Aortic valve regurgitation is trivial. Mild aortic valve sclerosis is present, with no evidence of aortic valve stenosis. Pulmonic Valve: The pulmonic valve was not well visualized. Pulmonic valve regurgitation is trivial. No evidence of pulmonic stenosis. Aorta: The aortic root is normal in size and structure. Venous: The inferior vena cava is normal in size with greater than 50% respiratory variability, suggesting right atrial pressure of 3 mmHg.  LEFT VENTRICLE PLAX 2D LVIDd:         4.10 cm     Diastology LVIDs:         3.20 cm     LV e' medial:    6.09 cm/s LV PW:         1.00 cm     LV E/e' medial:  11.1 LV IVS:        0.90 cm     LV e' lateral:   9.03 cm/s LVOT diam:     2.00 cm     LV E/e' lateral: 7.5 LV SV:         42 LV SV Index:   23 LVOT Area:     3.14 cm  LV Volumes (MOD) LV vol d, MOD A2C: 88.5 ml LV vol d, MOD A4C: 80.0 ml LV vol s,  MOD A2C: 46.6 ml LV vol s, MOD A4C: 41.6 ml LV SV MOD A2C:     41.9 ml LV SV MOD A4C:     80.0 ml LV SV MOD BP:      40.6 ml RIGHT VENTRICLE RV S prime:     13.80 cm/s TAPSE (M-mode): 1.5 cm LEFT ATRIUM          Index LA diam:    2.70 cm 1.46 cm/m  AORTIC VALVE LVOT Vmax:   71.80 cm/s LVOT Vmean:  56.700 cm/s LVOT VTI:    0.134 m  AORTA Ao Root diam: 3.70 cm MITRAL VALVE MV Area (PHT): 3.85 cm    SHUNTS MV Decel Time: 197 msec    Systemic VTI:  0.13 m MV E velocity: 67.70 cm/s  Systemic Diam: 2.00 cm MV A velocity: 81.00 cm/s MV E/A ratio:  0.84 Kirk Ruths MD Electronically signed by Kirk Ruths MD Signature Date/Time: 11/25/2020/10:54:57 AM    Final    CT ANGIO HEAD CODE STROKE  Result Date: 11/24/2020 CLINICAL DATA:  Neuro deficit, acute, stroke suspected EXAM: CT ANGIOGRAPHY HEAD AND NECK TECHNIQUE: Multidetector CT imaging of the head and neck was performed using the standard protocol during bolus administration of intravenous contrast. Multiplanar CT image reconstructions and MIPs were obtained to evaluate the vascular anatomy. Carotid stenosis measurements (when applicable) are obtained utilizing NASCET criteria, using the distal internal carotid diameter as the denominator. CONTRAST:  60m OMNIPAQUE IOHEXOL 350 MG/ML SOLN COMPARISON:  None. FINDINGS: CTA NECK FINDINGS Aortic arch: Standard branching. Mild aortic arch atherosclerotic calcifications. Mild proximal left subclavian artery narrowing secondary to atheromatous plaque. Otherwise patent great vessel origins. Right carotid system: Patent. Minimal proximal ICA atherosclerotic calcifications without significant narrowing. Left carotid system: Patent. 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 Vertebral arteries: Mild left vertebral artery origin narrowing secondary to atheromatous disease. Otherwise patent and codominant. Skeleton: No acute finding.  Mild spondylosis. Other neck: No adenopathy.  No soft tissue mass. Upper chest: Patchy and nodular bilateral upper lung opacities concerning for infectious/inflammatory foci. Review of the MIP images confirms the above findings CTA HEAD FINDINGS Anterior circulation: Minimal  carotid siphon atherosclerotic calcifications. Patent ICAs. Patent ophthalmic artery origins. Right A1 segment hypoplasia. Patent ACAs. Patent right MCA. Left M2 segment occlusion. Posterior circulation: Patent V4 segments. Patent right PICA. Patent basilar and superior cerebellar arteries. Patent bilateral PCAs. Venous sinuses: As permitted by contrast timing, patent. Anatomic variants: Please see above. Review of the MIP images confirms the above findings IMPRESSION: CTA neck: 30-40% distal left CCA narrowing secondary to atheromatous plaque. Mild narrowing of the proximal left subclavian and left vertebral artery origin. Bilateral upper lung nodular/patchy opacities concerning for infectious/inflammatory foci. CTA head: Left M2 branch occlusion. No aneurysm or dissection. Code stroke imaging results were communicated on 11/24/2020 at 4:41 pm to provider Dr. ARory Percyvia secure text paging. Electronically Signed   By: CPrimitivo GauzeM.D.   On: 11/24/2020 16:41   CT ANGIO NECK CODE STROKE  Result Date: 11/24/2020 CLINICAL DATA:  Neuro deficit, acute, stroke suspected EXAM: CT ANGIOGRAPHY HEAD AND NECK TECHNIQUE: Multidetector CT imaging of the head and neck was performed using the standard protocol during bolus administration of intravenous contrast. Multiplanar CT image reconstructions and MIPs were obtained to evaluate the vascular anatomy. Carotid stenosis measurements (when applicable) are obtained utilizing NASCET criteria, using the distal internal carotid diameter as the denominator. CONTRAST:  673mOMNIPAQUE IOHEXOL 350 MG/ML SOLN COMPARISON:  None. FINDINGS: CTA  NECK FINDINGS Aortic arch: Standard branching. Mild aortic arch atherosclerotic calcifications. Mild proximal left subclavian artery narrowing secondary to atheromatous plaque. Otherwise patent great vessel origins. Right carotid system: Patent. Minimal proximal ICA atherosclerotic calcifications without significant narrowing. Left carotid  system: Patent. 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 Vertebral arteries: Mild left vertebral artery origin narrowing secondary to atheromatous disease. Otherwise patent and codominant. Skeleton: No acute finding.  Mild spondylosis. Other neck: No adenopathy.  No soft tissue mass. Upper chest: Patchy and nodular bilateral upper lung opacities concerning for infectious/inflammatory foci. Review of the MIP images confirms the above findings CTA HEAD FINDINGS Anterior circulation: Minimal carotid siphon atherosclerotic calcifications. Patent ICAs. Patent ophthalmic artery origins. Right A1 segment hypoplasia. Patent ACAs. Patent right MCA. Left M2 segment occlusion. Posterior circulation: Patent V4 segments. Patent right PICA. Patent basilar and superior cerebellar arteries. Patent bilateral PCAs. Venous sinuses: As permitted by contrast timing, patent. Anatomic variants: Please see above. Review of the MIP images confirms the above findings IMPRESSION: CTA neck: 30-40% distal left CCA narrowing secondary to atheromatous plaque. Mild narrowing of the proximal left subclavian and left vertebral artery origin. Bilateral upper lung nodular/patchy opacities concerning for infectious/inflammatory foci. CTA head: Left M2 branch occlusion. No aneurysm or dissection. Code stroke imaging results were communicated on 11/24/2020 at 4:41 pm to provider Dr. Rory Percy via secure text paging. Electronically Signed   By: Primitivo Gauze M.D.   On: 11/24/2020 16:41

## 2020-12-06 DIAGNOSIS — I639 Cerebral infarction, unspecified: Secondary | ICD-10-CM | POA: Diagnosis not present

## 2020-12-06 DIAGNOSIS — U071 COVID-19: Secondary | ICD-10-CM | POA: Diagnosis not present

## 2020-12-06 DIAGNOSIS — J1282 Pneumonia due to coronavirus disease 2019: Secondary | ICD-10-CM | POA: Diagnosis not present

## 2020-12-06 LAB — CBC WITH DIFFERENTIAL/PLATELET
Abs Immature Granulocytes: 0.15 10*3/uL — ABNORMAL HIGH (ref 0.00–0.07)
Basophils Absolute: 0.1 10*3/uL (ref 0.0–0.1)
Basophils Relative: 1 %
Eosinophils Absolute: 0.1 10*3/uL (ref 0.0–0.5)
Eosinophils Relative: 1 %
HCT: 41.3 % (ref 36.0–46.0)
Hemoglobin: 13.2 g/dL (ref 12.0–15.0)
Immature Granulocytes: 2 %
Lymphocytes Relative: 19 %
Lymphs Abs: 1.7 10*3/uL (ref 0.7–4.0)
MCH: 25.2 pg — ABNORMAL LOW (ref 26.0–34.0)
MCHC: 32 g/dL (ref 30.0–36.0)
MCV: 78.8 fL — ABNORMAL LOW (ref 80.0–100.0)
Monocytes Absolute: 0.9 10*3/uL (ref 0.1–1.0)
Monocytes Relative: 10 %
Neutro Abs: 5.9 10*3/uL (ref 1.7–7.7)
Neutrophils Relative %: 67 %
Platelets: 408 10*3/uL — ABNORMAL HIGH (ref 150–400)
RBC: 5.24 MIL/uL — ABNORMAL HIGH (ref 3.87–5.11)
RDW: 18.5 % — ABNORMAL HIGH (ref 11.5–15.5)
WBC: 8.8 10*3/uL (ref 4.0–10.5)
nRBC: 0.2 % (ref 0.0–0.2)

## 2020-12-06 LAB — GLUCOSE, CAPILLARY
Glucose-Capillary: 185 mg/dL — ABNORMAL HIGH (ref 70–99)
Glucose-Capillary: 183 mg/dL — ABNORMAL HIGH (ref 70–99)
Glucose-Capillary: 162 mg/dL — ABNORMAL HIGH (ref 70–99)
Glucose-Capillary: 178 mg/dL — ABNORMAL HIGH (ref 70–99)

## 2020-12-06 LAB — COMPREHENSIVE METABOLIC PANEL
ALT: 15 U/L (ref 0–44)
AST: 16 U/L (ref 15–41)
Albumin: 2.5 g/dL — ABNORMAL LOW (ref 3.5–5.0)
Alkaline Phosphatase: 67 U/L (ref 38–126)
Anion gap: 9 (ref 5–15)
BUN: 22 mg/dL (ref 8–23)
CO2: 21 mmol/L — ABNORMAL LOW (ref 22–32)
Calcium: 8.6 mg/dL — ABNORMAL LOW (ref 8.9–10.3)
Chloride: 106 mmol/L (ref 98–111)
Creatinine, Ser: 0.9 mg/dL (ref 0.44–1.00)
GFR, Estimated: >60 mL/min (ref >60)
Glucose, Bld: 216 mg/dL — ABNORMAL HIGH (ref 70–99)
Potassium: 4.3 mmol/L (ref 3.5–5.1)
Sodium: 136 mmol/L (ref 135–145)
Total Bilirubin: 0.6 mg/dL (ref 0.3–1.2)
Total Protein: 6.4 g/dL — ABNORMAL LOW (ref 6.5–8.1)

## 2020-12-06 LAB — BRAIN NATRIURETIC PEPTIDE: B Natriuretic Peptide: 37.1 pg/mL (ref 0.0–100.0)

## 2020-12-06 LAB — C-REACTIVE PROTEIN: CRP: 0.7 mg/dL (ref <1.0)

## 2020-12-06 LAB — MAGNESIUM: Magnesium: 2.3 mg/dL (ref 1.7–2.4)

## 2020-12-06 NOTE — Progress Notes (Signed)
   12/05/20 2209  Assess: MEWS Score  Temp (!) 97.4 F (36.3 C)  BP (!) 92/54  Pulse Rate 65  ECG Heart Rate 66  Resp (!) 21  Level of Consciousness Unresponsive  SpO2 96 %  O2 Device Room Air  Patient Activity (if Appropriate) In bed  O2 Flow Rate (L/min) 0 L/min  Assess: MEWS Score  MEWS Temp 0  MEWS Systolic 1  MEWS Pulse 0  MEWS RR 1  MEWS LOC 3  MEWS Score 5  MEWS Score Color Red  Assess: if the MEWS score is Yellow or Red  Were vital signs taken at a resting state? Yes  Focused Assessment Change from prior assessment (see assessment flowsheet)  Early Detection of Sepsis Score *See Row Information* Low  MEWS guidelines implemented *See Row Information* Yes  Treat  MEWS Interventions Escalated (See documentation below)  Take Vital Signs  Increase Vital Sign Frequency  Red: Q 1hr X 4 then Q 4hr X 4, if remains red, continue Q 4hrs  Escalate  MEWS: Escalate Red: discuss with charge nurse/RN and provider, consider discussing with RRT  Notify: Charge Nurse/RN  Name of Charge Nurse/RN Notified Isaiha  Date Charge Nurse/RN Notified 12/05/20  Time Charge Nurse/RN Notified 2208  Notify: Provider  Provider Name/Title Darl Householder  Date Provider Notified 12/05/20  Time Provider Notified 2219  Notification Type Page  Notification Reason Change in status  Notify: Rapid Response  Name of Rapid Response RN Notified Mindy  Date Rapid Response Notified 12/05/20  Time Rapid Response Notified 2204  Document  Patient Outcome Stabilized after interventions  Progress note created (see row info) Yes    When RRT RN was at bedside, patient LOC increased to responsive to painful stimulation (sternal rubbing and nail bed pressure).  Refer to flowsheets for more information.

## 2020-12-06 NOTE — Progress Notes (Signed)
Inpatient Rehab Admissions Coordinator:   Spoke to Dr. Waldron Labs and Cedric Fishman, CSW.  Pt will require 21 day isolation for Covid.  If pt remains in house I will f/u with her when she comes off of precautions.   Shann Medal, PT, DPT Admissions Coordinator [phone] 12/06/20  12:40 PM

## 2020-12-06 NOTE — Progress Notes (Addendum)
  Speech Language Pathology Treatment: Dysphagia;Cognitive-Linquistic  Patient Details Name: Emily Lozano MRN: [id number] DOB: 10-Jun-1951 Today's Date: 12/06/2020 Time: 3474-2595 SLP Time Calculation (min) (ACUTE ONLY): 14.68 min  Assessment / Plan / Recommendation Clinical Impression  Pt was seen for treatment and was seated upright in the recliner. Pt stated "I'm alright" when asked how she was feeling with continued dysphonia, but with some slight improvement in voicing. She accurately responded to yes/no questions during functional tasks, but repetition was intermittently needed. She became frustrated when clarification was requested since reduced vocal intensity and vocal quality reduced speech intelligibility. SLP will provide a communication board to augment communication prior to/during the next session. Pt exhibited increased difficulty maintaining alertness during this session and pt's RN reported that the pt did not sleep well last night. Pt's RN and NT denied any overt s/sx of aspiration with solids or liquids, but stated that her intake has been poor. Pt tolerated nectar thick liquids via cup without overt s/sx of aspiration. She exhibited increased difficulty sucking from a straw. Pt refused all solids during the session. The session was terminated prematurely due to pt's indication of fatigue and her increased difficulty maintaining alertness as the session progressed. SLP will continue to follow pt.   HPI HPI: [name] is a 70 y.o. female Hx of multiple cerebrovascular risk factors presenting with sudden onset of left gaze preference, right facial droop, hemiparesis and expressive aphasia still with left hemispheric dysfunction. She is also Covid positive with peroneal vein thrombosis on lower extremity Dopplers and she is on IV heparin. Intubated from 2/26-3/2 also with stridor following extubation. 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 Rapid response on 3/9 due to unresponsiveness; CT head revealed no hemorrhagic conversion or acute change      SLP Plan  Continue with current plan of care       Recommendations  Diet recommendations: Dysphagia 2 (fine chop);Nectar-thick liquid Liquids provided via: Cup;Teaspoon;Straw Medication Administration: Crushed with puree Supervision: Staff to assist with self feeding;Full supervision/cueing for compensatory strategies Compensations: Slow rate;Small sips/bites (present food towards left.) Postural Changes and/or Swallow Maneuvers: Upright 30-60 min after meal;Seated upright 90 degrees                Oral Care Recommendations: Oral care BID;Staff/trained caregiver to provide oral care Follow up Recommendations: Inpatient Rehab SLP Visit Diagnosis: Dysphagia, oropharyngeal phase (R13.12) Plan: Continue with current plan of care       Johni Narine I. Hardin Negus, [place], [place] Office number [phone] Pager [place] 12/06/2020, 10:39 AM

## 2020-12-06 NOTE — Progress Notes (Signed)
Occupational Therapy Treatment Patient Details Name: Emily Lozano MRN: [id number] DOB: 15-Jun-1951 Today's Date: 12/06/2020    History of present illness 70 y.o. female presents with acute onset R weakness, aphasia, left gaze. CTA demonstrates L M2 occlusion. Pt admitted and underwent thrombectomy on 11/24/2020 (unsuccessful). PMH: significant for hypertension, hyperlipidemia, type 2 diabetes mellitus, depression, GERD, melanoma, sleep apnea, and recent exposure to COVID-19 with a negative test at her PCP yesterday but diagnosed with acute bronchitis.   OT comments  Pt with slow progress towards OT goals, limited by lethargy this AM. Guided pt in sitting balance and weightbearing exercises sitting EOB with initial Max A required but progressed to Min A with pt holding to footboard with L UE. Pt endorses being able to feel LOB but unable to correct it without physical assist. Guided pt in pivot to recliner with Max A x 2, R knee blocking to maximize safety. Pt continues to require extensive assist for dressing tasks and presents with R neglect, difficulty shifting gaze beyond midline without multimodal cues. Applied KT tape around R shoulder joint to improve stability and repositioned R UE on pillow. Plan to further assess shoulder joint integrity, sitting balance, and ADLs during next session.    Follow Up Recommendations  SNF;Supervision/Assistance - 24 hour    Equipment Recommendations  3 in 1 bedside commode;Wheelchair (measurements OT);Wheelchair cushion (measurements OT)    Recommendations for Other Services      Precautions / Restrictions Precautions Precautions: Fall Precaution Comments: COVID postive Restrictions Weight Bearing Restrictions: No       Mobility Bed Mobility Overal bed mobility: Needs Assistance Bed Mobility: Rolling;Sidelying to Sit Rolling: Mod assist Sidelying to sit: Max assist       General bed mobility comments: Mod A for rolling, able to reach with  L UE across and initiate. Max A for getting LE off of bed and lift trunk. Pt attempting to assist by pushing L UE against bed    Transfers Overall transfer level: Needs assistance Equipment used: 2 person hand held assist Transfers: Sit to/from [employer] Transfers Sit to Stand: Max assist;+2 physical assistance;+2 safety/equipment Stand pivot transfers: Max assist;+2 physical assistance;+2 safety/equipment       General transfer comment: Max A x 2 for stand and pivot to chair. Unable to sequence LEs, R LE unable to support weight well - knee blockinig    Balance Overall balance assessment: Needs assistance Sitting-balance support: Feet supported;Single extremity supported;No upper extremity supported Sitting balance-Leahy Scale: Poor Sitting balance - Comments: Max A for maintaining balance initially without UE support and with UE on bed. Increased to Min A with L UE holding to footboard. Postural control: Right lateral lean;Posterior lean Standing balance support: During functional activity;Single extremity supported Standing balance-Leahy Scale: Poor Standing balance comment: Reliant on physical A and blocking of BLEs.                           ADL either performed or assessed with clinical judgement   ADL Overall ADL's : Needs assistance/impaired     Grooming: Maximal assistance;Sitting Grooming Details (indicate cue type and reason): Max A due to level of arousal. able to reach to top of head with L UE in attempts to fix hair         Upper Body Dressing : Bed level;Maximal assistance Upper Body Dressing Details (indicate cue type and reason): Max A to don new gown in bed Lower Body Dressing: Maximal assistance;Bed  level Lower Body Dressing Details (indicate cue type and reason): Max A to don underwear, socks on in bed. Pt able to assist by lifting L LE. unable to lift R LE               General ADL Comments: Entering as NT finishing up bath for pt  bed level. Session focused on balance EOB and transfer to chair     Vision   Vision Assessment?: Yes Tracking/Visual Pursuits: Requires cues, head turns, or add eye shifts to track;Unable to hold eye position out of midline;Impaired - to be further tested in functional context Visual Fields: Right visual field deficit;Impaired-to be further tested in functional context Additional Comments: Able to look straight ahead, unable to turn head to R without assist. With cues, able to move eyes 10* to R to look at therapist but unable to scan further   Perception     Praxis      Cognition Arousal/Alertness: Lethargic Behavior During Therapy: Flat affect Overall Cognitive Status: Difficult to assess Area of Impairment: Attention;Following commands;Awareness;Problem solving;Safety/judgement                   Current Attention Level: Sustained   Following Commands: Follows one step commands inconsistently;Follows one step commands with increased time Safety/Judgement: Decreased awareness of deficits Awareness: Intellectual Problem Solving: Slow processing;Requires verbal cues;Requires tactile cues General Comments: eyes closed for majority of session. Inconsistent following of commands, did answer yes/no questions consistently. Overall very lethargic, R neglect        Exercises Exercises: Other exercises Other Exercises Other Exercises: Weightbearing to R elbow, pushing and pulling self with external assist to facilitate balance correction   Shoulder Instructions       General Comments VSS on RA. Applied KT tape to support shoulder joint to prevent subluxation due to no active movement noted. Repositioned R UE on pillow    Pertinent Vitals/ Pain       Pain Assessment: Faces Faces Pain Scale: No hurt  Home Living                                          Prior Functioning/Environment              Frequency  Min 2X/week        Progress Toward  Goals  OT Goals(current goals can now be found in the care plan section)  Progress towards OT goals: OT to reassess next treatment  Acute Rehab OT Goals Patient Stated Goal: Pt unstable to state OT Goal Formulation: Patient unable to participate in goal setting Time For Goal Achievement: 12/10/20 Potential to Achieve Goals: Good ADL Goals Pt Will Perform Grooming: with mod assist;sitting Pt Will Perform Upper Body Dressing: with mod assist;sitting Pt Will Transfer to Toilet: with mod assist;ambulating;bedside commode;with +2 assist Additional ADL Goal #1: Pt will tolerate sitting at EOB for 10 minutes with Mod A in preparation for ADLs Additional ADL Goal #2: Pt will follow one step commands during ADLs with MIn cues  Plan Discharge plan needs to be updated    Co-evaluation                 AM-PAC OT "6 Clicks" Daily Activity     Outcome Measure   Help from another person eating meals?: A Lot Help from another person taking care of personal grooming?: A Lot Help from  another person toileting, which includes using toliet, bedpan, or urinal?: Total Help from another person bathing (including washing, rinsing, drying)?: A Lot Help from another person to put on and taking off regular upper body clothing?: A Lot Help from another person to put on and taking off regular lower body clothing?: A Lot 6 Click Score: 11    End of Session    OT Visit Diagnosis: Unsteadiness on feet (R26.81);Other abnormalities of gait and mobility (R26.89);Muscle weakness (generalized) (M62.81);Pain;Hemiplegia and hemiparesis Hemiplegia - Right/Left: Right Hemiplegia - dominant/non-dominant: Dominant Hemiplegia - caused by: Cerebral infarction   Activity Tolerance Patient limited by lethargy   Patient Left in chair;with call bell/phone within reach;with chair alarm set   Nurse Communication Mobility status;Other (comment) (KT tape)        Time: 3664-4034 OT Time Calculation (min): 39  min  Charges: OT General Charges $OT Visit: 1 Visit OT Treatments $Self Care/Home Management : 8-22 mins $Therapeutic Activity: 23-37 mins  Malachy Chamber, OTR/L Acute Rehab Services Office: [phone]   Layla Maw 12/06/2020, 9:53 AM

## 2020-12-06 NOTE — Progress Notes (Signed)
PROGRESS NOTE                                                                                                                                                                                                             Patient Demographics:    Emily Lozano, is a 70 y.o. female, DOB - Aug 04, 1951, OZD:[id number]  Outpatient Primary MD for the patient is Marinda Elk, MD    LOS - 12  Admit date - 11/24/2020    Chief Complaint  Patient presents with  . Code Stroke       Brief Narrative (HPI from H&P)  - [name] is a 70 y.o. female Hx of multiple cerebrovascular risk factors including hypertension, atrial fibrillation, carotid artery disease, dyslipidemia, DM type II who presented with sudden onset of left gaze preference, right facial droop, hemiparesis and expressive aphasia still with left hemispheric dysfunction. CTA head and neck with left M2 occlusion and though outside the window for IV TPA was a candidate for intervention now s/p unsuccessful revascularization, though there was some distal movement of the clot from M2 to M3; TIKI3, also found to have COVID-19 pneumonia, was seen by critical care required intubation to protect her airway, case complicated by development of right lower extremity DVT requiring heparin drip.  She was transferred to my care on 12/02/2020 on day 8 of her hospital stay.   Subjective:   Patient in bed, she is with significant aphasia, cannot provide any complaints, overnight she had rapid response for altered mental status, her work-up including CT head with no acute findings.  .   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  In a patient with underlying A. fib, hypertension, dyslipidemia and DM type II. - She has been under neuro service, has had thorough stroke work-up, underwent unsuccessful  revascularization attempt by IR, currently undergoing PT OT and speech therapy, most likely will require placement.  Still has dense right-sided weakness and expressive aphasia along with some dysphagia, currently on aspirin, statin along with Eliquis for anticoagulation. - will require replacement,CIR vs SNF - she remains with dysphagia, followed closely by SLP, she is on dysphagia 2 diet with nectar thick liquid.D/W staff she will be encouraged to increase her oral intake.  Dyslipidemia.  -  LDL was 70 on rosuvastatin 10 mg.  Continue  Paroxysmal A. fib RVR.  - [place] vas 2 score of greater than 5, continue beta-blocker along with heparin drip >> Eliquis.  Echo shows a EF of 55 to 60% no acute findings  Essential hypertension.   -Blood pressure currently controlled on Norvasc and metoprolol .  Right lower extremity DVT.   - Continue Eliquis.  History of smoking.  Counseled to quit.  COVID-19 pneumonia with acute hypoxic respiratory failure.  Required intubation and ICU stay, has completed Remdesivir course, tapering down Decadron.  She had some post intubation laryngeal edema and stridor which is much improved, currently on room air. -She will need 21 days of isolation given evidence of COVID-19 of pneumonia on imaging on presentation requiring intubation, and she was treated with 10 days of  steroids.  DM type II.  On Levemir along with sliding scale, continue, dose adjusted as steroids being tapered.  Lab Results  Component Value Date   HGBA1C 10.6 (H) 11/25/2020   Lab Results  Component Value Date   CHOL 139 11/25/2020   HDL 15 (L) 11/25/2020   LDLCALC UNABLE TO CALCULATE IF TRIGLYCERIDE OVER 400 mg/dL 11/25/2020   LDLDIRECT 70.6 11/28/2020   TRIG 212 (H) 12/01/2020   CHOLHDL 9.3 11/25/2020   CBG (last 3)  Recent Labs    12/05/20 2214 12/06/20 0731 12/06/20 1258  GLUCAP 163* 185* 183*            Condition - Extremely Guarded  Family Communication :  None at  bedside  Code Status : Full  Consults  :  Neuro, PCCM  PUD Prophylaxis :  PPI   Procedures  :     Intubated and extubated in ICU.  Unsuccessful attempt at left M2 occlusion removal by IR      Disposition Plan  :    Status is: Inpatient  Remains inpatient appropriate because:IV treatments appropriate due to intensity of illness or inability to take PO   Dispo: The patient is from: Home              Anticipated d/c is to: SNF              Patient currently is not medically stable to d/c.   Difficult to place patient No  DVT Prophylaxis  :  Heparin >> Eliquis  Lab Results  Component Value Date   PLT 408 (H) 12/06/2020    Diet :  Diet Order            DIET DYS 2 Room service appropriate? No; Fluid consistency: Nectar Thick  Diet effective now                  Inpatient Medications  Scheduled Meds: . amLODipine  10 mg Oral Daily  . apixaban  10 mg Oral BID   Followed by  . [START ON 12/09/2020] apixaban  5 mg Oral BID  . chlorhexidine  15 mL Mouth Rinse BID  . feeding supplement  237 mL Oral TID BM  . insulin aspart  0-15 Units Subcutaneous TID WC  . insulin detemir  12 Units Subcutaneous Daily  . mouth rinse  15 mL Mouth Rinse q12n4p  . metoprolol tartrate  50 mg Oral BID  . mometasone-formoterol  2 puff Inhalation BID  . pantoprazole  40 mg Oral QHS  . rosuvastatin  10 mg Oral Daily  . sodium chloride flush  3 mL Intravenous Once   Continuous Infusions:  PRN Meds:.acetaminophen **OR** acetaminophen (TYLENOL) oral liquid 160 mg/5 mL **OR** acetaminophen, hydrALAZINE, Resource ThickenUp Clear, senna-docusate  Antibiotics  :    Anti-infectives (From admission, onward)   Start     Dose/Rate Route Frequency Ordered Stop   11/25/20 1000  remdesivir 100 mg in sodium chloride 0.9 % 100 mL IVPB       "Followed by" Linked Group Details   100 mg 200 mL/hr over 30 Minutes Intravenous Daily 11/24/20 2119 11/28/20 1119   11/24/20 2215  remdesivir 200 mg in  sodium chloride 0.9% 250 mL IVPB       "Followed by" Linked Group Details   200 mg 580 mL/hr over 30 Minutes Intravenous Once 11/24/20 2119 11/25/20 0932        Dawood Elgergawy M.D on 12/06/2020 at 2:44 PM  To page go to [email or web address]   [facility] -  Office  [phone]    See all Orders from today for further details    Objective:   Vitals:   12/06/20 0300 12/06/20 0500 12/06/20 0600 12/06/20 0928  BP: (!) 113/52  (!) 141/74 131/78  Pulse:   77   Resp: 19  (!) 23   Temp:   98.7 F (37.1 C)   TempSrc:   Axillary   SpO2:   95%   Weight:  68.3 kg    Height:        Wt Readings from Last 3 Encounters:  12/06/20 68.3 kg  05/25/18 74.4 kg  12/10/17 76.7 kg     Intake/Output Summary (Last 24 hours) at 12/06/2020 1444 Last data filed at 12/06/2020 0600 Gross per 24 hour  Intake -  Output 400 ml  Net -400 ml     Physical Exam  Awake with dense right-sided hemiparesis, with expressive aphasia and neglect . Symmetrical Chest wall movement, Good air movement bilaterally, CTAB RRR,No Gallops,Rubs or new Murmurs, No Parasternal Heave +ve B.Sounds, Abd Soft, No tenderness, No rebound - guarding or rigidity. No Cyanosis, Clubbing or edema, No new Rash or bruise        Data Review:    CBC Recent Labs  Lab 12/01/20 0453 12/02/20 0432 12/03/20 0118 12/04/20 0724 12/06/20 0232  WBC 16.5* 17.2* 16.4* 12.0* 8.8  HGB 12.3 10.8* 11.3* 11.4* 13.2  HCT 37.6 32.5* 36.5 36.8 41.3  PLT 319 412* 437* 482* 408*  MCV 77.4* 76.8* 78.3* 79.8* 78.8*  MCH 25.3* 25.5* 24.2* 24.7* 25.2*  MCHC 32.7 33.2 31.0 31.0 32.0  RDW 16.8* 17.1* 17.0* 17.4* 18.5*  LYMPHSABS 1.4 1.6 1.3 2.2 1.7  MONOABS 1.0 0.7 0.7 0.6 0.9  EOSABS 0.0 0.1 0.1 0.2 0.1  BASOSABS 0.0 0.0 0.1 0.1 0.1    Recent Labs  Lab 12/02/20 0432 12/03/20 0118 12/04/20 0724 12/05/20 0058 12/06/20 0232  NA 136 134* 136 138 136  K 3.7 3.6 3.5 4.0 4.3  CL 104 102 103 107 106  CO2 20* 22 20* 11* 21*   GLUCOSE 138* 162* 120* 192* 216*  BUN $Re'13 11 15 15 22  'RfK$ CREATININE 0.69 0.70 0.69 0.63 0.90  CALCIUM 8.5* 8.5* 8.8* 9.2 8.6*  AST  --  $R'16 16 17 16  'IN$ ALT  --  $R'18 20 20 15  'bB$ ALKPHOS  --  82 87 84 67  BILITOT  --  0.9 0.7 0.7 0.6  ALBUMIN  --  2.5* 2.8* 2.9* 2.5*  MG  --  2.3 2.3 2.3 2.3  CRP  --  5.9* 1.6* 0.8 0.7  PROCALCITON  --  <  0.10 0.12 <0.10  --   BNP  --  386.1* 174.0* 110.0* 37.1    ------------------------------------------------------------------------------------------------------------------ No results for input(s): CHOL, HDL, LDLCALC, TRIG, CHOLHDL, LDLDIRECT in the last 72 hours.  Lab Results  Component Value Date   HGBA1C 10.6 (H) 11/25/2020   ------------------------------------------------------------------------------------------------------------------ No results for input(s): TSH, T4TOTAL, T3FREE, THYROIDAB in the last 72 hours.  Invalid input(s): FREET3  Cardiac Enzymes No results for input(s): CKMB, TROPONINI, MYOGLOBIN in the last 168 hours.  Invalid input(s): CK ------------------------------------------------------------------------------------------------------------------    Component Value Date/Time   BNP 37.1 12/06/2020 0232    Micro Results No results found for this or any previous visit (from the past 240 hour(s)).  Radiology Reports CT HEAD WO CONTRAST  Result Date: 12/06/2020 CLINICAL DATA:  Stroke follow-up EXAM: CT HEAD WITHOUT CONTRAST TECHNIQUE: Contiguous axial images were obtained from the base of the skull through the vertex without intravenous contrast. COMPARISON:  12/02/2020 FINDINGS: Brain: Expected evolution of left MCA territory infarct with decreased amount of cytotoxic edema. No hemorrhage. There is no midline shift or other mass effect. Severe chronic white matter disease is unchanged. No hydrocephalus. Vascular: No hyperdense vessel or unexpected calcification. Skull: Normal. Negative for fracture or focal lesion.  Sinuses/Orbits: No acute finding. Other: None. IMPRESSION: Decreased edema at site of left MCA territory infarct without hemorrhage or mass effect. Electronically Signed   By: Ulyses Jarred M.D.   On: 12/06/2020 00:28   CT HEAD WO CONTRAST  Result Date: 12/02/2020 CLINICAL DATA:  Stroke follow-up EXAM: CT HEAD WITHOUT CONTRAST TECHNIQUE: Contiguous axial images were obtained from the base of the skull through the vertex without intravenous contrast. COMPARISON:  Brain MRI 11/25/2020 FINDINGS: Brain: Large superior division left MCA territory infarct matching the area of diffusion abnormality on prior. Mild petechial hemorrhage is present and non progressed from MRI. No midline shift. Background of chronic small vessel ischemia in the cerebral white matter. No hydrocephalus or masslike finding. Vascular: Unchanged Skull: Normal. Negative for fracture or focal lesion. Sinuses/Orbits: Negative IMPRESSION: 1. Large recent left MCA territory infarct with mild petechial hemorrhage. No progression since brain MRI 11/25/2020. 2. Extensive chronic small vessel ischemia. Electronically Signed   By: Monte Fantasia M.D.   On: 12/02/2020 11:14   MR BRAIN WO CONTRAST  Result Date: 11/25/2020 CLINICAL DATA:  Acute stroke presentation 11/24/2020. Left insular infarction. EXAM: MRI HEAD WITHOUT CONTRAST TECHNIQUE: Multiplanar, multiecho pulse sequences of the brain and surrounding structures were obtained without intravenous contrast. COMPARISON:  CT and interventional studies over the last day. FINDINGS: Brain: Diffusion imaging shows acute infarction in the deep insula and left frontoparietal cortical and subcortical brain. Mild swelling in the region. There are either minimal petechial blood products or there is susceptibility artifact related to thrombosed vessels. No frank hematoma. No mass effect or shift. Elsewhere, the brainstem and cerebellum are unremarkable. Both cerebral hemispheres show moderate to severe  chronic small-vessel ischemic changes throughout the white matter. No hydrocephalus. No extra-axial collection. Vascular: Major vessels at base of the brain show flow. Skull and upper cervical spine: Negative Sinuses/Orbits: Mucosal inflammatory changes of paranasal sinuses. Orbits negative. Other: None IMPRESSION: 1. Acute infarction in the left MCA territory affecting the deep insula and left frontoparietal cortical and subcortical brain. Minimal petechial blood products or susceptibility artifact related to thrombosed vessels. No frank hematoma. No mass effect or shift. 2. Moderate to severe chronic small-vessel ischemic changes elsewhere throughout the cerebral hemispheric white matter. Electronically Signed   By: Nelson Chimes  M.D.   On: 11/25/2020 02:18   IR [place]  Result Date: 11/27/2020 INDICATION: 70 year old female with past medical history significant for rheumatoid arthritis, diabetes mellitus, hypertension, anemia and melanoma presenting with acute onset of right hemiparesis, aphasia and left gaze preference. She was found to be covered positive today. Last known well at 10:30 a.m. on 11/24/2020; modified Rankin scale 0; NIHSS 7. No IV tPA given as she was outside the window. Head CT showed an acute infarct the in the left insula and CT angiogram showed intracranial atherosclerotic disease with a left M2/MCA occlusion. EXAM: Ultrasound-guided vascular access Diagnostic cerebral angiogram Mechanical thrombectomy Flat panel head CT COMPARISON:  CT/CT angiogram of the head and neck November 24, 2020. MEDICATIONS: Refer to anesthesia documentation. ANESTHESIA/SEDATION: The procedure was performed under general anesthesia CONTRAST:  For 75 mL of Omnipaque 240 mg/mL FLUOROSCOPY TIME:  Fluoroscopy Time: 24 minutes 54 seconds (1,520 mGy). COMPLICATIONS: SIR Level A - No therapy, no consequence. TECHNIQUE: Informed written consent was obtained from the patient's son after a thorough discussion of the  procedural risks, benefits and alternatives. All questions were addressed. Maximal Sterile Barrier Technique was utilized including caps, mask, sterile gowns, sterile gloves, sterile drape, hand hygiene and skin antiseptic. A timeout was performed prior to the initiation of the procedure. The right groin was prepped and draped in the usual sterile fashion. Using a micropuncture kit and the modified Seldinger technique, access was gained to the right common femoral artery and an 8 French sheath was placed. Real-time ultrasound guidance was utilized for vascular access including the acquisition of a permanent ultrasound image documenting patency of the accessed vessel. Under fluoroscopy, a Zoom 88 guide catheter was navigated over a 6 [place] Berenstein 2 catheter and a 0.035" Terumo Glidewire into the aortic arch. The catheter was placed into the left common carotid artery and then advanced into the left internal carotid artery. The inner catheter was removed. Frontal and lateral angiograms of the head were obtained. FINDINGS: 1. Occlusion of the mid left M2/MCA middle division branch. 2. Intracranial atherosclerotic disease with multiple luminal irregularities along the left MCA and left ACA vascular tree more significant at the A3 segment where there is moderate stenosis. 3. Patent right common femoral artery. PROCEDURE: Under biplane roadmap, a zoom 55 aspiration catheter was navigated over an Aristotle 18 microguidewire into the cavernous segment of the left ICA and then advanced to the level of occlusion. The aspiration catheter was connected to a penumbra aspiration pump. The guiding catheter balloon was inflated. The aspiration catheter were removed under constant aspiration. Follow-up left ICA angiograms showed persistent occlusion which appear to have progressed more distally. Under biplane roadmap, a zoom 55 aspiration catheter was navigated over an Aristotle 18 microguidewire into the cavernous segment of  the left ICA and then advanced to the level of occlusion. The aspiration catheter was connected to a penumbra aspiration pump. The guiding catheter balloon was inflated. The aspiration catheter were removed under constant aspiration. Follow-up left ICA angiograms showed no significant change. Under biplane roadmap, a zoom 55 aspiration catheter was navigated over a phenom 21 microcatheter and a Aristotle 18 microguidewire into the cavernous segment of the right ICA. The microcatheter was then navigated over the wire into the left M3/MCA middle division branch. Then, a 4 x 40 mm solitaire stent retriever was deployed spanning the left M2 and proximal M3 segment. The device was allowed to intercalated with the clot for 4 minutes. The microcatheter was removed. The aspiration  catheter was advanced to the level of occlusion and connected to a penumbra aspiration pump. The thrombectomy device and aspiration catheter were removed under constant aspiration. Follow-up left ICA angiogram showed significant change. Under biplane roadmap, a zoom 55 aspiration catheter was navigated over a phenom 21 microcatheter and a Aristotle 18 microguidewire into the cavernous segment of the right ICA. The microcatheter was then navigated over the wire into the left M3/MCA middle division branch. Then, a 4 x 40 mm solitaire stent retriever was deployed spanning the left M2 and proximal M3 segment. The device was allowed to intercalated with the clot for 4 minutes. The microcatheter was removed. The aspiration catheter was advanced to the level of occlusion and connected to a penumbra aspiration pump. The thrombectomy device and aspiration catheter were removed under constant aspiration. Follow-up angiogram showed persistent occlusion. Flat panel CT of the head was obtained and post processed in a separate workstation with concurrent attending physician supervision. Selected images were sent to PACS. Small contrast extravasation in the left  sylvian fissure noted. Follow-up angiogram showed no evidence of active contrast extravasation with persistent occlusion of a left M2 branch. Delay flat panel CT of the head was obtained and post processed in a separate workstation with concurrent attending physician supervision. Selected images were sent to PACS. Left sylvian contrast extravasation appears stable. Delayed left ICA angiograms confirmed no evidence of contrast extravasation. The catheter was subsequently withdrawn. Left common femoral artery angiograms were obtained in frontal and lateral views. The puncture is at the level of the common femoral artery which has adequate caliber for closure device. The sheath was exchanged for a Perclose ProGlide which was utilized for access closure. Immediate hemostasis was achieved. IMPRESSION: Unsuccessful attempted recanalization of a mid left M2/MCA middle division branch complicated by small amount contrast extravasation in the left sylvian fissure. No active extravasation seen at the end of the procedure. Left MCA status remains TICI2b, which is similar to presentation. PLAN: Patient is to remain intubated given unsuccessful recanalization and COVID positive test. She will be transferred to ICU for continued care. Electronically Signed   By: Pedro Earls M.D.   On: 11/27/2020 09:25   IR CT [facility]  Result Date: 11/27/2020 INDICATION: 70 year old female with past medical history significant for rheumatoid arthritis, diabetes mellitus, hypertension, anemia and melanoma presenting with acute onset of right hemiparesis, aphasia and left gaze preference. She was found to be covered positive today. Last known well at 10:30 a.m. on 11/24/2020; modified Rankin scale 0; NIHSS 7. No IV tPA given as she was outside the window. Head CT showed an acute infarct the in the left insula and CT angiogram showed intracranial atherosclerotic disease with a left M2/MCA occlusion. EXAM: Ultrasound-guided  vascular access Diagnostic cerebral angiogram Mechanical thrombectomy Flat panel head CT COMPARISON:  CT/CT angiogram of the head and neck November 24, 2020. MEDICATIONS: Refer to anesthesia documentation. ANESTHESIA/SEDATION: The procedure was performed under general anesthesia CONTRAST:  For 75 mL of Omnipaque 240 mg/mL FLUOROSCOPY TIME:  Fluoroscopy Time: 24 minutes 54 seconds (1,520 mGy). COMPLICATIONS: SIR Level A - No therapy, no consequence. TECHNIQUE: Informed written consent was obtained from the patient's son after a thorough discussion of the procedural risks, benefits and alternatives. All questions were addressed. Maximal Sterile Barrier Technique was utilized including caps, mask, sterile gowns, sterile gloves, sterile drape, hand hygiene and skin antiseptic. A timeout was performed prior to the initiation of the procedure. The right groin was prepped and draped in  the usual sterile fashion. Using a micropuncture kit and the modified Seldinger technique, access was gained to the right common femoral artery and an 8 French sheath was placed. Real-time ultrasound guidance was utilized for vascular access including the acquisition of a permanent ultrasound image documenting patency of the accessed vessel. Under fluoroscopy, a Zoom 88 guide catheter was navigated over a 6 [place] Berenstein 2 catheter and a 0.035" Terumo Glidewire into the aortic arch. The catheter was placed into the left common carotid artery and then advanced into the left internal carotid artery. The inner catheter was removed. Frontal and lateral angiograms of the head were obtained. FINDINGS: 1. Occlusion of the mid left M2/MCA middle division branch. 2. Intracranial atherosclerotic disease with multiple luminal irregularities along the left MCA and left ACA vascular tree more significant at the A3 segment where there is moderate stenosis. 3. Patent right common femoral artery. PROCEDURE: Under biplane roadmap, a zoom 55 aspiration  catheter was navigated over an Aristotle 18 microguidewire into the cavernous segment of the left ICA and then advanced to the level of occlusion. The aspiration catheter was connected to a penumbra aspiration pump. The guiding catheter balloon was inflated. The aspiration catheter were removed under constant aspiration. Follow-up left ICA angiograms showed persistent occlusion which appear to have progressed more distally. Under biplane roadmap, a zoom 55 aspiration catheter was navigated over an Aristotle 18 microguidewire into the cavernous segment of the left ICA and then advanced to the level of occlusion. The aspiration catheter was connected to a penumbra aspiration pump. The guiding catheter balloon was inflated. The aspiration catheter were removed under constant aspiration. Follow-up left ICA angiograms showed no significant change. Under biplane roadmap, a zoom 55 aspiration catheter was navigated over a phenom 21 microcatheter and a Aristotle 18 microguidewire into the cavernous segment of the right ICA. The microcatheter was then navigated over the wire into the left M3/MCA middle division branch. Then, a 4 x 40 mm solitaire stent retriever was deployed spanning the left M2 and proximal M3 segment. The device was allowed to intercalated with the clot for 4 minutes. The microcatheter was removed. The aspiration catheter was advanced to the level of occlusion and connected to a penumbra aspiration pump. The thrombectomy device and aspiration catheter were removed under constant aspiration. Follow-up left ICA angiogram showed significant change. Under biplane roadmap, a zoom 55 aspiration catheter was navigated over a phenom 21 microcatheter and a Aristotle 18 microguidewire into the cavernous segment of the right ICA. The microcatheter was then navigated over the wire into the left M3/MCA middle division branch. Then, a 4 x 40 mm solitaire stent retriever was deployed spanning the left M2 and proximal M3  segment. The device was allowed to intercalated with the clot for 4 minutes. The microcatheter was removed. The aspiration catheter was advanced to the level of occlusion and connected to a penumbra aspiration pump. The thrombectomy device and aspiration catheter were removed under constant aspiration. Follow-up angiogram showed persistent occlusion. Flat panel CT of the head was obtained and post processed in a separate workstation with concurrent attending physician supervision. Selected images were sent to PACS. Small contrast extravasation in the left sylvian fissure noted. Follow-up angiogram showed no evidence of active contrast extravasation with persistent occlusion of a left M2 branch. Delay flat panel CT of the head was obtained and post processed in a separate workstation with concurrent attending physician supervision. Selected images were sent to PACS. Left sylvian contrast extravasation appears stable. Delayed left  ICA angiograms confirmed no evidence of contrast extravasation. The catheter was subsequently withdrawn. Left common femoral artery angiograms were obtained in frontal and lateral views. The puncture is at the level of the common femoral artery which has adequate caliber for closure device. The sheath was exchanged for a Perclose ProGlide which was utilized for access closure. Immediate hemostasis was achieved. IMPRESSION: Unsuccessful attempted recanalization of a mid left M2/MCA middle division branch complicated by small amount contrast extravasation in the left sylvian fissure. No active extravasation seen at the end of the procedure. Left MCA status remains TICI2b, which is similar to presentation. PLAN: Patient is to remain intubated given unsuccessful recanalization and COVID positive test. She will be transferred to ICU for continued care. Electronically Signed   By: Pedro Earls M.D.   On: 11/27/2020 09:25   IR US Guide Vasc Access Right  Result Date:  11/27/2020 INDICATION: 70 year old female with past medical history significant for rheumatoid arthritis, diabetes mellitus, hypertension, anemia and melanoma presenting with acute onset of right hemiparesis, aphasia and left gaze preference. She was found to be covered positive today. Last known well at 10:30 a.m. on 11/24/2020; modified Rankin scale 0; NIHSS 7. No IV tPA given as she was outside the window. Head CT showed an acute infarct the in the left insula and CT angiogram showed intracranial atherosclerotic disease with a left M2/MCA occlusion. EXAM: Ultrasound-guided vascular access Diagnostic cerebral angiogram Mechanical thrombectomy Flat panel head CT COMPARISON:  CT/CT angiogram of the head and neck November 24, 2020. MEDICATIONS: Refer to anesthesia documentation. ANESTHESIA/SEDATION: The procedure was performed under general anesthesia CONTRAST:  For 75 mL of Omnipaque 240 mg/mL FLUOROSCOPY TIME:  Fluoroscopy Time: 24 minutes 54 seconds (1,520 mGy). COMPLICATIONS: SIR Level A - No therapy, no consequence. TECHNIQUE: Informed written consent was obtained from the patient's son after a thorough discussion of the procedural risks, benefits and alternatives. All questions were addressed. Maximal Sterile Barrier Technique was utilized including caps, mask, sterile gowns, sterile gloves, sterile drape, hand hygiene and skin antiseptic. A timeout was performed prior to the initiation of the procedure. The right groin was prepped and draped in the usual sterile fashion. Using a micropuncture kit and the modified Seldinger technique, access was gained to the right common femoral artery and an 8 French sheath was placed. Real-time ultrasound guidance was utilized for vascular access including the acquisition of a permanent ultrasound image documenting patency of the accessed vessel. Under fluoroscopy, a Zoom 88 guide catheter was navigated over a 6 [place] Berenstein 2 catheter and a 0.035" Terumo Glidewire into  the aortic arch. The catheter was placed into the left common carotid artery and then advanced into the left internal carotid artery. The inner catheter was removed. Frontal and lateral angiograms of the head were obtained. FINDINGS: 1. Occlusion of the mid left M2/MCA middle division branch. 2. Intracranial atherosclerotic disease with multiple luminal irregularities along the left MCA and left ACA vascular tree more significant at the A3 segment where there is moderate stenosis. 3. Patent right common femoral artery. PROCEDURE: Under biplane roadmap, a zoom 55 aspiration catheter was navigated over an Aristotle 18 microguidewire into the cavernous segment of the left ICA and then advanced to the level of occlusion. The aspiration catheter was connected to a penumbra aspiration pump. The guiding catheter balloon was inflated. The aspiration catheter were removed under constant aspiration. Follow-up left ICA angiograms showed persistent occlusion which appear to have progressed more distally. Under biplane roadmap, a zoom  55 aspiration catheter was navigated over an Aristotle 18 microguidewire into the cavernous segment of the left ICA and then advanced to the level of occlusion. The aspiration catheter was connected to a penumbra aspiration pump. The guiding catheter balloon was inflated. The aspiration catheter were removed under constant aspiration. Follow-up left ICA angiograms showed no significant change. Under biplane roadmap, a zoom 55 aspiration catheter was navigated over a phenom 21 microcatheter and a Aristotle 18 microguidewire into the cavernous segment of the right ICA. The microcatheter was then navigated over the wire into the left M3/MCA middle division branch. Then, a 4 x 40 mm solitaire stent retriever was deployed spanning the left M2 and proximal M3 segment. The device was allowed to intercalated with the clot for 4 minutes. The microcatheter was removed. The aspiration catheter was advanced to  the level of occlusion and connected to a penumbra aspiration pump. The thrombectomy device and aspiration catheter were removed under constant aspiration. Follow-up left ICA angiogram showed significant change. Under biplane roadmap, a zoom 55 aspiration catheter was navigated over a phenom 21 microcatheter and a Aristotle 18 microguidewire into the cavernous segment of the right ICA. The microcatheter was then navigated over the wire into the left M3/MCA middle division branch. Then, a 4 x 40 mm solitaire stent retriever was deployed spanning the left M2 and proximal M3 segment. The device was allowed to intercalated with the clot for 4 minutes. The microcatheter was removed. The aspiration catheter was advanced to the level of occlusion and connected to a penumbra aspiration pump. The thrombectomy device and aspiration catheter were removed under constant aspiration. Follow-up angiogram showed persistent occlusion. Flat panel CT of the head was obtained and post processed in a separate workstation with concurrent attending physician supervision. Selected images were sent to PACS. Small contrast extravasation in the left sylvian fissure noted. Follow-up angiogram showed no evidence of active contrast extravasation with persistent occlusion of a left M2 branch. Delay flat panel CT of the head was obtained and post processed in a separate workstation with concurrent attending physician supervision. Selected images were sent to PACS. Left sylvian contrast extravasation appears stable. Delayed left ICA angiograms confirmed no evidence of contrast extravasation. The catheter was subsequently withdrawn. Left common femoral artery angiograms were obtained in frontal and lateral views. The puncture is at the level of the common femoral artery which has adequate caliber for closure device. The sheath was exchanged for a Perclose ProGlide which was utilized for access closure. Immediate hemostasis was achieved. IMPRESSION:  Unsuccessful attempted recanalization of a mid left M2/MCA middle division branch complicated by small amount contrast extravasation in the left sylvian fissure. No active extravasation seen at the end of the procedure. Left MCA status remains TICI2b, which is similar to presentation. PLAN: Patient is to remain intubated given unsuccessful recanalization and COVID positive test. She will be transferred to ICU for continued care. Electronically Signed   By: Pedro Earls M.D.   On: 11/27/2020 09:25   CT CEREBRAL PERFUSION W CONTRAST  Result Date: 11/24/2020 CLINICAL DATA:  Neuro deficit, acute, stroke suspected EXAM: CT PERFUSION BRAIN TECHNIQUE: Multiphase CT imaging of the brain was performed following IV bolus contrast injection. Subsequent parametric perfusion maps were calculated using RAPID software. CONTRAST:  72mL OMNIPAQUE IOHEXOL 350 MG/ML SOLN COMPARISON:  Concurrent CTA head and neck. FINDINGS: CT Brain Perfusion Findings: CBF (<30%) Volume: 29mL Perfusion (Tmax>6.0s) volume: 58mL Mismatch Volume: 102mL ASPECTS on noncontrast CT Head: 9 at 16:42 today. Infarct  Core: 0 mL Infarction Location:Not applicable. IMPRESSION: Perfusion imaging demonstrates no infarct core. 30 mL region of ischemia involving the left MCA territory. Electronically Signed   By: Primitivo Gauze M.D.   On: 11/24/2020 16:55   DG Chest Port 1 View  Result Date: 12/03/2020 CLINICAL DATA:  Shortness of breath.  COVID-19 positive EXAM: PORTABLE CHEST 1 VIEW COMPARISON:  December 02, 2020 FINDINGS: Patchy airspace opacity is noted in the lung bases, more on the right than on the left. Lungs elsewhere clear. Heart is upper normal in size with pulmonary vascularity normal. No adenopathy. There is aortic atherosclerosis. There is calcification in each carotid artery. No bone lesions. IMPRESSION: Persistent airspace opacity bases, more on the right than the left, likely due to atypical organism pneumonia. No new opacity  evident. Stable cardiac silhouette. Aortic Atherosclerosis (ICD10-I70.0). There are foci of carotid artery calcification bilaterally. Electronically Signed   By: Lowella Grip III M.D.   On: 12/03/2020 08:41   DG Chest Port 1 View  Result Date: 12/02/2020 CLINICAL DATA:  Shortness of breath EXAM: PORTABLE CHEST 1 VIEW COMPARISON:  November 24, 2020 FINDINGS: The cardiomediastinal silhouette is unchanged in contour.Interval extubation. Atherosclerotic calcifications of the aorta. No pleural effusion. No pneumothorax. Patchy RIGHT basilar peripheral opacities, similar in comparison to prior. Mildly improved aeration of the LEFT lung base in comparison to prior. Visualized abdomen is unremarkable. Multilevel degenerative changes of the thoracic spine. IMPRESSION: 1. Patchy RIGHT basilar peripheral opacities, similar in comparison to prior and consistent with the sequela of COVID-19 infection. 2. Mildly improved aeration of the LEFT lung base in comparison to prior. Electronically Signed   By: Valentino Saxon MD   On: 12/02/2020 11:26   DG Chest Port 1 View  Result Date: 11/24/2020 CLINICAL DATA:  COVID positive EXAM: PORTABLE CHEST 1 VIEW COMPARISON:  None. FINDINGS: The heart size and mediastinal contours are mildly enlarged. ETT is 4 cm above the carina. NG tube is seen below the diaphragm. Aortic knob calcifications are seen. There is hazy patchy airspace opacity seen at the periphery of the right lower lung and at the left lung base. No pleural effusion. IMPRESSION: ETT and NG tube in satisfactory position Bilateral multifocal airspace opacities, consistent with multifocal pneumonia Electronically Signed   By: Prudencio Pair M.D.   On: 11/24/2020 22:14   IR PERCUTANEOUS ART THROMBECTOMY/INFUSION INTRACRANIAL INC DIAG ANGIO  Result Date: 11/27/2020 INDICATION: 70 year old female with past medical history significant for rheumatoid arthritis, diabetes mellitus, hypertension, anemia and melanoma  presenting with acute onset of right hemiparesis, aphasia and left gaze preference. She was found to be covered positive today. Last known well at 10:30 a.m. on 11/24/2020; modified Rankin scale 0; NIHSS 7. No IV tPA given as she was outside the window. Head CT showed an acute infarct the in the left insula and CT angiogram showed intracranial atherosclerotic disease with a left M2/MCA occlusion. EXAM: Ultrasound-guided vascular access Diagnostic cerebral angiogram Mechanical thrombectomy Flat panel head CT COMPARISON:  CT/CT angiogram of the head and neck November 24, 2020. MEDICATIONS: Refer to anesthesia documentation. ANESTHESIA/SEDATION: The procedure was performed under general anesthesia CONTRAST:  For 75 mL of Omnipaque 240 mg/mL FLUOROSCOPY TIME:  Fluoroscopy Time: 24 minutes 54 seconds (1,520 mGy). COMPLICATIONS: SIR Level A - No therapy, no consequence. TECHNIQUE: Informed written consent was obtained from the patient's son after a thorough discussion of the procedural risks, benefits and alternatives. All questions were addressed. Maximal Sterile Barrier Technique was utilized including caps, mask, sterile  gowns, sterile gloves, sterile drape, hand hygiene and skin antiseptic. A timeout was performed prior to the initiation of the procedure. The right groin was prepped and draped in the usual sterile fashion. Using a micropuncture kit and the modified Seldinger technique, access was gained to the right common femoral artery and an 8 French sheath was placed. Real-time ultrasound guidance was utilized for vascular access including the acquisition of a permanent ultrasound image documenting patency of the accessed vessel. Under fluoroscopy, a Zoom 88 guide catheter was navigated over a 6 [place] Berenstein 2 catheter and a 0.035" Terumo Glidewire into the aortic arch. The catheter was placed into the left common carotid artery and then advanced into the left internal carotid artery. The inner catheter was  removed. Frontal and lateral angiograms of the head were obtained. FINDINGS: 1. Occlusion of the mid left M2/MCA middle division branch. 2. Intracranial atherosclerotic disease with multiple luminal irregularities along the left MCA and left ACA vascular tree more significant at the A3 segment where there is moderate stenosis. 3. Patent right common femoral artery. PROCEDURE: Under biplane roadmap, a zoom 55 aspiration catheter was navigated over an Aristotle 18 microguidewire into the cavernous segment of the left ICA and then advanced to the level of occlusion. The aspiration catheter was connected to a penumbra aspiration pump. The guiding catheter balloon was inflated. The aspiration catheter were removed under constant aspiration. Follow-up left ICA angiograms showed persistent occlusion which appear to have progressed more distally. Under biplane roadmap, a zoom 55 aspiration catheter was navigated over an Aristotle 18 microguidewire into the cavernous segment of the left ICA and then advanced to the level of occlusion. The aspiration catheter was connected to a penumbra aspiration pump. The guiding catheter balloon was inflated. The aspiration catheter were removed under constant aspiration. Follow-up left ICA angiograms showed no significant change. Under biplane roadmap, a zoom 55 aspiration catheter was navigated over a phenom 21 microcatheter and a Aristotle 18 microguidewire into the cavernous segment of the right ICA. The microcatheter was then navigated over the wire into the left M3/MCA middle division branch. Then, a 4 x 40 mm solitaire stent retriever was deployed spanning the left M2 and proximal M3 segment. The device was allowed to intercalated with the clot for 4 minutes. The microcatheter was removed. The aspiration catheter was advanced to the level of occlusion and connected to a penumbra aspiration pump. The thrombectomy device and aspiration catheter were removed under constant aspiration.  Follow-up left ICA angiogram showed significant change. Under biplane roadmap, a zoom 55 aspiration catheter was navigated over a phenom 21 microcatheter and a Aristotle 18 microguidewire into the cavernous segment of the right ICA. The microcatheter was then navigated over the wire into the left M3/MCA middle division branch. Then, a 4 x 40 mm solitaire stent retriever was deployed spanning the left M2 and proximal M3 segment. The device was allowed to intercalated with the clot for 4 minutes. The microcatheter was removed. The aspiration catheter was advanced to the level of occlusion and connected to a penumbra aspiration pump. The thrombectomy device and aspiration catheter were removed under constant aspiration. Follow-up angiogram showed persistent occlusion. Flat panel CT of the head was obtained and post processed in a separate workstation with concurrent attending physician supervision. Selected images were sent to PACS. Small contrast extravasation in the left sylvian fissure noted. Follow-up angiogram showed no evidence of active contrast extravasation with persistent occlusion of a left M2 branch. Delay flat panel CT of the  head was obtained and post processed in a separate workstation with concurrent attending physician supervision. Selected images were sent to PACS. Left sylvian contrast extravasation appears stable. Delayed left ICA angiograms confirmed no evidence of contrast extravasation. The catheter was subsequently withdrawn. Left common femoral artery angiograms were obtained in frontal and lateral views. The puncture is at the level of the common femoral artery which has adequate caliber for closure device. The sheath was exchanged for a Perclose ProGlide which was utilized for access closure. Immediate hemostasis was achieved. IMPRESSION: Unsuccessful attempted recanalization of a mid left M2/MCA middle division branch complicated by small amount contrast extravasation in the left sylvian  fissure. No active extravasation seen at the end of the procedure. Left MCA status remains TICI2b, which is similar to presentation. PLAN: Patient is to remain intubated given unsuccessful recanalization and COVID positive test. She will be transferred to ICU for continued care. Electronically Signed   By: Pedro Earls M.D.   On: 11/27/2020 09:25   CT HEAD CODE STROKE WO CONTRAST  Result Date: 11/24/2020 CLINICAL DATA:  Code stroke.  Neuro deficit, acute, stroke suspected EXAM: CT HEAD WITHOUT CONTRAST TECHNIQUE: Contiguous axial images were obtained from the base of the skull through the vertex without intravenous contrast. COMPARISON:  06/27/2020. FINDINGS: Brain: No intracranial hemorrhage. New focal hypodensity involving the left insula. No mass lesion. No midline shift, ventriculomegaly or extra-axial fluid collection. Chronic microvascular ischemic changes. Vascular: No hyperdense vessel or unexpected calcification. Minimal carotid siphon atherosclerotic calcifications. Skull: Negative for fracture or focal lesion. Sinuses/Orbits: No acute finding.  Mild pansinus mucosal thickening. Other: Right parietal scalp nodule is unchanged. ASPECTS [facility] Stroke Program Early CT Score) - Ganglionic level infarction (caudate, lentiform nuclei, internal capsule, insula, M1-M3 cortex): 6 - Supraganglionic infarction (M4-M6 cortex): 3 Total score (0-10 with 10 being normal): 9 IMPRESSION: 1. 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 2. Chronic microvascular ischemic changes. 3. ASPECTS is 9 Code stroke imaging results were communicated on 11/24/2020 at 4:23 pm to provider Dr. Rory Percy via secure text paging. Electronically Signed   By: Primitivo Gauze M.D.   On: 11/24/2020 16:24   VAS [place] LOWER EXTREMITY VENOUS (DVT)  Result Date: 11/26/2020  Lower Venous DVT Study Indications: Stroke, and Covid-19.  Comparison Study: No prior study on file Performing Technologist: Sharion Dove RVS   Examination Guidelines: A complete evaluation includes B-mode imaging, spectral Doppler, color Doppler, and power Doppler as needed of all accessible portions of each vessel. Bilateral testing is considered an integral part of a complete examination. Limited examinations for reoccurring indications may be performed as noted. The reflux portion of the exam is performed with the patient in reverse Trendelenburg.  +---------+---------------+---------+-----------+----------+--------------+ RIGHT    CompressibilityPhasicitySpontaneityPropertiesThrombus Aging +---------+---------------+---------+-----------+----------+--------------+ CFV      Full           Yes      Yes                                 +---------+---------------+---------+-----------+----------+--------------+ SFJ      Full                                                        +---------+---------------+---------+-----------+----------+--------------+ FV Prox  Full                                                        +---------+---------------+---------+-----------+----------+--------------+  FV Mid   Full                                                        +---------+---------------+---------+-----------+----------+--------------+ FV DistalFull                                                        +---------+---------------+---------+-----------+----------+--------------+ PFV      Full                                                        +---------+---------------+---------+-----------+----------+--------------+ POP      Full           Yes      Yes                                 +---------+---------------+---------+-----------+----------+--------------+ PTV      Full                                                        +---------+---------------+---------+-----------+----------+--------------+ PERO     None                                         Acute           +---------+---------------+---------+-----------+----------+--------------+   +---------+---------------+---------+-----------+----------+--------------+ LEFT     CompressibilityPhasicitySpontaneityPropertiesThrombus Aging +---------+---------------+---------+-----------+----------+--------------+ CFV      Full           Yes      Yes                                 +---------+---------------+---------+-----------+----------+--------------+ SFJ      Full                                                        +---------+---------------+---------+-----------+----------+--------------+ FV Prox  Full                                                        +---------+---------------+---------+-----------+----------+--------------+ FV Mid   Full                                                        +---------+---------------+---------+-----------+----------+--------------+  FV DistalFull                                                        +---------+---------------+---------+-----------+----------+--------------+ PFV      Full                                                        +---------+---------------+---------+-----------+----------+--------------+ POP      Full           Yes      Yes                                 +---------+---------------+---------+-----------+----------+--------------+ PTV      Full                                                        +---------+---------------+---------+-----------+----------+--------------+ PERO     Full                                                        +---------+---------------+---------+-----------+----------+--------------+     Summary: RIGHT: - Findings consistent with acute deep vein thrombosis involving the right peroneal veins.  LEFT: - There is no evidence of deep vein thrombosis in the lower extremity.  *See table(s) above for measurements and observations. Electronically signed by  Servando Snare MD on 11/26/2020 at 5:59:41 PM.    Final    ECHOCARDIOGRAM LIMITED  Result Date: 11/25/2020    ECHOCARDIOGRAM REPORT   Patient Name:   [facility] Date of Exam: 11/25/2020 Medical Rec #:  [id number]        Height:       64.0 in Accession #:    [id number]       Weight:       176.4 lb Date of Birth:  [birth year]        BSA:          1.855 m Patient Age:    8 years         BP:           119/66 mmHg Patient Gender: F                HR:           77 bpm. Exam Location:  Inpatient Procedure: Limited Echo, Cardiac Doppler and Color Doppler Indications:    Stroke I63.9  History:        Patient has no prior history of Echocardiogram examinations.                 Risk Factors:Hypertension, Diabetes and Former Smoker.  Sonographer:    Vickie Epley RDCS Referring Phys: [id number] ASHISH ARORA  Sonographer Comments: Echo performed with patient supine and on artificial respirator. Covid positive. IMPRESSIONS  1.  Left ventricular ejection fraction, by estimation, is 55 to 60%. The left ventricle has normal function. The left ventricle has no regional wall motion abnormalities. Left ventricular diastolic parameters are consistent with Grade I diastolic dysfunction (impaired relaxation).  2. Right ventricular systolic function is normal. The right ventricular size is normal.  3. The mitral valve is normal in structure. Trivial mitral valve regurgitation. No evidence of mitral stenosis.  4. The aortic valve is tricuspid. Aortic valve regurgitation is trivial. Mild aortic valve sclerosis is present, with no evidence of aortic valve stenosis.  5. The inferior vena cava is normal in size with greater than 50% respiratory variability, suggesting right atrial pressure of 3 mmHg. FINDINGS  Left Ventricle: Left ventricular ejection fraction, by estimation, is 55 to 60%. The left ventricle has normal function. The left ventricle has no regional wall motion abnormalities. The left ventricular internal cavity size was normal in  size. There is  no left ventricular hypertrophy. Left ventricular diastolic parameters are consistent with Grade I diastolic dysfunction (impaired relaxation). Right Ventricle: The right ventricular size is normal.Right ventricular systolic function is normal. Left Atrium: Left atrial size was normal in size. Right Atrium: Right atrial size was normal in size. Pericardium: There is no evidence of pericardial effusion. Mitral Valve: The mitral valve is normal in structure. Trivial mitral valve regurgitation. No evidence of mitral valve stenosis. Tricuspid Valve: The tricuspid valve is normal in structure. Tricuspid valve regurgitation is trivial. No evidence of tricuspid stenosis. Aortic Valve: The aortic valve is tricuspid. Aortic valve regurgitation is trivial. Mild aortic valve sclerosis is present, with no evidence of aortic valve stenosis. Pulmonic Valve: The pulmonic valve was not well visualized. Pulmonic valve regurgitation is trivial. No evidence of pulmonic stenosis. Aorta: The aortic root is normal in size and structure. Venous: The inferior vena cava is normal in size with greater than 50% respiratory variability, suggesting right atrial pressure of 3 mmHg.  LEFT VENTRICLE PLAX 2D LVIDd:         4.10 cm     Diastology LVIDs:         3.20 cm     LV e' medial:    6.09 cm/s LV PW:         1.00 cm     LV E/e' medial:  11.1 LV IVS:        0.90 cm     LV e' lateral:   9.03 cm/s LVOT diam:     2.00 cm     LV E/e' lateral: 7.5 LV SV:         42 LV SV Index:   23 LVOT Area:     3.14 cm  LV Volumes (MOD) LV vol d, MOD A2C: 88.5 ml LV vol d, MOD A4C: 80.0 ml LV vol s, MOD A2C: 46.6 ml LV vol s, MOD A4C: 41.6 ml LV SV MOD A2C:     41.9 ml LV SV MOD A4C:     80.0 ml LV SV MOD BP:      40.6 ml RIGHT VENTRICLE RV S prime:     13.80 cm/s TAPSE (M-mode): 1.5 cm LEFT ATRIUM         Index LA diam:    2.70 cm 1.46 cm/m  AORTIC VALVE LVOT Vmax:   71.80 cm/s LVOT Vmean:  56.700 cm/s LVOT VTI:    0.134 m  AORTA Ao Root diam:  3.70 cm MITRAL VALVE MV Area (PHT): 3.85 cm    SHUNTS MV Decel Time:  197 msec    Systemic VTI:  0.13 m MV E velocity: 67.70 cm/s  Systemic Diam: 2.00 cm MV A velocity: 81.00 cm/s MV E/A ratio:  0.84 Kirk Ruths MD Electronically signed by Kirk Ruths MD Signature Date/Time: 11/25/2020/10:54:57 AM    Final    CT ANGIO HEAD CODE STROKE  Result Date: 11/24/2020 CLINICAL DATA:  Neuro deficit, acute, stroke suspected EXAM: CT ANGIOGRAPHY HEAD AND NECK TECHNIQUE: Multidetector CT imaging of the head and neck was performed using the standard protocol during bolus administration of intravenous contrast. Multiplanar CT image reconstructions and MIPs were obtained to evaluate the vascular anatomy. Carotid stenosis measurements (when applicable) are obtained utilizing NASCET criteria, using the distal internal carotid diameter as the denominator. CONTRAST:  23m OMNIPAQUE IOHEXOL 350 MG/ML SOLN COMPARISON:  None. FINDINGS: CTA NECK FINDINGS Aortic arch: Standard branching. Mild aortic arch atherosclerotic calcifications. Mild proximal left subclavian artery narrowing secondary to atheromatous plaque. Otherwise patent great vessel origins. Right carotid system: Patent. Minimal proximal ICA atherosclerotic calcifications without significant narrowing. Left carotid system: Patent. 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 Vertebral arteries: Mild left vertebral artery origin narrowing secondary to atheromatous disease. Otherwise patent and codominant. Skeleton: No acute finding.  Mild spondylosis. Other neck: No adenopathy.  No soft tissue mass. Upper chest: Patchy and nodular bilateral upper lung opacities concerning for infectious/inflammatory foci. Review of the MIP images confirms the above findings CTA HEAD FINDINGS Anterior circulation: Minimal carotid siphon atherosclerotic calcifications. Patent ICAs. Patent ophthalmic artery origins. Right A1 segment hypoplasia. Patent ACAs. Patent right  MCA. Left M2 segment occlusion. Posterior circulation: Patent V4 segments. Patent right PICA. Patent basilar and superior cerebellar arteries. Patent bilateral PCAs. Venous sinuses: As permitted by contrast timing, patent. Anatomic variants: Please see above. Review of the MIP images confirms the above findings IMPRESSION: CTA neck: 30-40% distal left CCA narrowing secondary to atheromatous plaque. Mild narrowing of the proximal left subclavian and left vertebral artery origin. Bilateral upper lung nodular/patchy opacities concerning for infectious/inflammatory foci. CTA head: Left M2 branch occlusion. No aneurysm or dissection. Code stroke imaging results were communicated on 11/24/2020 at 4:41 pm to provider Dr. ARory Percyvia secure text paging. Electronically Signed   By: CPrimitivo GauzeM.D.   On: 11/24/2020 16:41   CT ANGIO NECK CODE STROKE  Result Date: 11/24/2020 CLINICAL DATA:  Neuro deficit, acute, stroke suspected EXAM: CT ANGIOGRAPHY HEAD AND NECK TECHNIQUE: Multidetector CT imaging of the head and neck was performed using the standard protocol during bolus administration of intravenous contrast. Multiplanar CT image reconstructions and MIPs were obtained to evaluate the vascular anatomy. Carotid stenosis measurements (when applicable) are obtained utilizing NASCET criteria, using the distal internal carotid diameter as the denominator. CONTRAST:  620mOMNIPAQUE IOHEXOL 350 MG/ML SOLN COMPARISON:  None. FINDINGS: CTA NECK FINDINGS Aortic arch: Standard branching. Mild aortic arch atherosclerotic calcifications. Mild proximal left subclavian artery narrowing secondary to atheromatous plaque. Otherwise patent great vessel origins. Right carotid system: Patent. Minimal proximal ICA atherosclerotic calcifications without significant narrowing. Left carotid system: Patent. 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 Vertebral arteries: Mild left vertebral artery origin narrowing  secondary to atheromatous disease. Otherwise patent and codominant. Skeleton: No acute finding.  Mild spondylosis. Other neck: No adenopathy.  No soft tissue mass. Upper chest: Patchy and nodular bilateral upper lung opacities concerning for infectious/inflammatory foci. Review of the MIP images confirms the above findings CTA HEAD FINDINGS Anterior circulation: Minimal carotid siphon atherosclerotic calcifications. Patent ICAs. Patent ophthalmic  artery origins. Right A1 segment hypoplasia. Patent ACAs. Patent right MCA. Left M2 segment occlusion. Posterior circulation: Patent V4 segments. Patent right PICA. Patent basilar and superior cerebellar arteries. Patent bilateral PCAs. Venous sinuses: As permitted by contrast timing, patent. Anatomic variants: Please see above. Review of the MIP images confirms the above findings IMPRESSION: CTA neck: 30-40% distal left CCA narrowing secondary to atheromatous plaque. Mild narrowing of the proximal left subclavian and left vertebral artery origin. Bilateral upper lung nodular/patchy opacities concerning for infectious/inflammatory foci. CTA head: Left M2 branch occlusion. No aneurysm or dissection. Code stroke imaging results were communicated on 11/24/2020 at 4:41 pm to provider Dr. Rory Percy via secure text paging. Electronically Signed   By: Primitivo Gauze M.D.   On: 11/24/2020 16:41

## 2020-12-06 NOTE — TOC Progression Note (Signed)
Transition of Care [facility]) - Progression Note    Patient Details  Name: Emily Lozano MRN: [id number] Date of Birth: May 01, 1951  Transition of Care [facility]) CM/SW [place], LCSW Phone Number: 12/06/2020, 5:53 PM  Clinical Narrative:    CSW left voicemail for patient's daughter-in-law.    Expected Discharge Plan: [place] Barriers to Discharge: Continued Medical Work up,SNF Pending bed offer,SNF Covid  Expected Discharge Plan and Services Expected Discharge Plan: [place] In-house Referral: Clinical Social Work   Post Acute Care Choice: [place] Living arrangements for the past 2 months: Single Family Home                                       Social Determinants of Health (SDOH) Interventions    Readmission Risk Interventions No flowsheet data found.

## 2020-12-07 DIAGNOSIS — I639 Cerebral infarction, unspecified: Secondary | ICD-10-CM | POA: Diagnosis not present

## 2020-12-07 DIAGNOSIS — U071 COVID-19: Secondary | ICD-10-CM | POA: Diagnosis not present

## 2020-12-07 DIAGNOSIS — J1282 Pneumonia due to coronavirus disease 2019: Secondary | ICD-10-CM | POA: Diagnosis not present

## 2020-12-07 LAB — CBC
HCT: 42.4 % (ref 36.0–46.0)
Hemoglobin: 12.9 g/dL (ref 12.0–15.0)
MCH: 24.4 pg — ABNORMAL LOW (ref 26.0–34.0)
MCHC: 30.4 g/dL (ref 30.0–36.0)
MCV: 80.3 fL (ref 80.0–100.0)
Platelets: 438 10*3/uL — ABNORMAL HIGH (ref 150–400)
RBC: 5.28 MIL/uL — ABNORMAL HIGH (ref 3.87–5.11)
RDW: 18 % — ABNORMAL HIGH (ref 11.5–15.5)
WBC: 9.9 10*3/uL (ref 4.0–10.5)
nRBC: 0 % (ref 0.0–0.2)

## 2020-12-07 LAB — GLUCOSE, CAPILLARY
Glucose-Capillary: 166 mg/dL — ABNORMAL HIGH (ref 70–99)
Glucose-Capillary: 209 mg/dL — ABNORMAL HIGH (ref 70–99)
Glucose-Capillary: 165 mg/dL — ABNORMAL HIGH (ref 70–99)
Glucose-Capillary: 180 mg/dL — ABNORMAL HIGH (ref 70–99)

## 2020-12-07 LAB — BASIC METABOLIC PANEL
Anion gap: 10 (ref 5–15)
BUN: 22 mg/dL (ref 8–23)
CO2: 19 mmol/L — ABNORMAL LOW (ref 22–32)
Calcium: 8.7 mg/dL — ABNORMAL LOW (ref 8.9–10.3)
Chloride: 106 mmol/L (ref 98–111)
Creatinine, Ser: 0.76 mg/dL (ref 0.44–1.00)
GFR, Estimated: >60 mL/min (ref >60)
Glucose, Bld: 207 mg/dL — ABNORMAL HIGH (ref 70–99)
Potassium: 4.1 mmol/L (ref 3.5–5.1)
Sodium: 135 mmol/L (ref 135–145)

## 2020-12-07 NOTE — Progress Notes (Signed)
PROGRESS NOTE                                                                                                                                                                                                             Patient Demographics:    Emily Lozano, is a 70 y.o. female, DOB - September 30, 1950, SEG:[id number]  Outpatient Primary MD for the patient is Marinda Elk, MD    LOS - 13  Admit date - 11/24/2020    Chief Complaint  Patient presents with  . Code Stroke       Brief Narrative (HPI from H&P)  - Emily Lozano is a 70 y.o. female Hx of multiple cerebrovascular risk factors including hypertension, atrial fibrillation, carotid artery disease, dyslipidemia, DM type II who presented with sudden onset of left gaze preference, right facial droop, hemiparesis and expressive aphasia still with left hemispheric dysfunction. CTA head and neck with left M2 occlusion and though outside the window for IV TPA was a candidate for intervention now s/p unsuccessful revascularization, though there was some distal movement of the clot from M2 to M3; TIKI3, also found to have COVID-19 pneumonia, was seen by critical care required intubation to protect her airway, case complicated by development of right lower extremity DVT requiring heparin drip.  She was transferred to [facility] care on 12/02/2020 on day 8 of her hospital stay.   Subjective:   Patient in bed, no significant events overnight as discussed with staff, she finished 50% of her lunch yesterday.  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  In a patient with underlying A. fib, hypertension, dyslipidemia and DM type II. - She has been under neuro service, has had  thorough stroke work-up, underwent unsuccessful revascularization attempt by IR. -Unfortunately she is with residual  dense right-sided weakness and expressive aphasia along with some dysphagia, by PT/OT, will need placement.  . - Currently on aspirin, statin along with Eliquis for anticoagulation. - she remains with dysphagia, followed closely by SLP, she is on dysphagia 2 diet with nectar thick liquid.D/W staff she will be encouraged to increase her oral intake.  Dyslipidemia.  - LDL  was 70 on rosuvastatin 10 mg.  Continue  Paroxysmal A. fib RVR.  - [place] vas 2 score of greater than 5, continue beta-blocker along  Eliquis.  Echo shows a EF of 55 to 60% no acute findings  Essential hypertension.   -Blood pressure currently controlled on Norvasc and metoprolol .  Right lower extremity DVT.   - Continue Eliquis.  History of smoking.  Counseled to quit.  COVID-19 pneumonia with acute hypoxic respiratory failure.  Required intubation and ICU stay, has completed Remdesivir course, tapering down Decadron.  She had some post intubation laryngeal edema and stridor which is much improved, currently on room air. -She is currently back on room air with no oxygen requirement. -Need 21 days of isolation.  DM type II.  On Levemir along with sliding scale, continue, dose adjusted as steroids being tapered.  Lab Results  Component Value Date   HGBA1C 10.6 (H) 11/25/2020   Lab Results  Component Value Date   CHOL 139 11/25/2020   HDL 15 (L) 11/25/2020   LDLCALC UNABLE TO CALCULATE IF TRIGLYCERIDE OVER 400 mg/dL 11/25/2020   LDLDIRECT 70.6 11/28/2020   TRIG 212 (H) 12/01/2020   CHOLHDL 9.3 11/25/2020   CBG (last 3)  Recent Labs    12/06/20 2120 12/07/20 0719 12/07/20 1237  GLUCAP 178* 166* 209*            Condition - Extremely Guarded  Family Communication : Discussed with son at bedside  Code Status : Full  Consults  :  Neuro, PCCM  PUD Prophylaxis :  PPI   Procedures  :      Intubated and extubated in ICU.  Unsuccessful attempt at left M2 occlusion removal by IR      Disposition Plan  :    Status is: Inpatient  Remains inpatient appropriate because:IV treatments appropriate due to intensity of illness or inability to take PO   Dispo: The patient is from: Home              Anticipated d/c is to: SNF              Patient currently is not medically stable to d/c.   Difficult to place patient No  DVT Prophylaxis  :  Heparin >> Eliquis  Lab Results  Component Value Date   PLT 438 (H) 12/07/2020    Diet :  Diet Order            DIET DYS 2 Room service appropriate? No; Fluid consistency: Nectar Thick  Diet effective now                  Inpatient Medications  Scheduled Meds: . amLODipine  10 mg Oral Daily  . apixaban  10 mg Oral BID   Followed by  . [START ON 12/09/2020] apixaban  5 mg Oral BID  . chlorhexidine  15 mL Mouth Rinse BID  . feeding supplement  237 mL Oral TID BM  . insulin aspart  0-15 Units Subcutaneous TID WC  . insulin detemir  12 Units Subcutaneous Daily  . mouth rinse  15 mL Mouth Rinse q12n4p  . metoprolol tartrate  50 mg Oral BID  . mometasone-formoterol  2 puff Inhalation BID  . pantoprazole  40 mg Oral QHS  . rosuvastatin  10 mg Oral Daily  . sodium chloride flush  3 mL Intravenous Once   Continuous Infusions:  PRN Meds:.acetaminophen **OR** acetaminophen (TYLENOL) oral liquid 160 mg/5 mL **OR** acetaminophen, hydrALAZINE, Resource ThickenUp Clear,  senna-docusate  Antibiotics  :    Anti-infectives (From admission, onward)   Start     Dose/Rate Route Frequency Ordered Stop   11/25/20 1000  remdesivir 100 mg in sodium chloride 0.9 % 100 mL IVPB       "Followed by" Linked Group Details   100 mg 200 mL/hr over 30 Minutes Intravenous Daily 11/24/20 2119 11/28/20 1119   11/24/20 2215  remdesivir 200 mg in sodium chloride 0.9% 250 mL IVPB       "Followed by" Linked Group Details   200 mg 580 mL/hr over 30  Minutes Intravenous Once 11/24/20 2119 11/25/20 0932        Alexiya Franqui M.D on 12/07/2020 at 2:10 PM  To page go to [email or web address]   [facility] -  Office  [phone]    See all Orders from today for further details    Objective:   Vitals:   12/07/20 0900 12/07/20 1000 12/07/20 1100 12/07/20 1200  BP:    126/73  Pulse:    81  Resp: (!) 30 (!) 27 (!) 29 20  Temp:    98.7 F (37.1 C)  TempSrc:    Oral  SpO2:    99%  Weight:      Height:        Wt Readings from Last 3 Encounters:  12/06/20 68.3 kg  05/25/18 74.4 kg  12/10/17 76.7 kg     Intake/Output Summary (Last 24 hours) at 12/07/2020 1410 Last data filed at 12/07/2020 0900 Gross per 24 hour  Intake 240 ml  Output --  Net 240 ml     Physical Exam  Awake with dense right-sided hemiparesis, with expressive aphasia and neglect . Symmetrical Chest wall movement, Good air movement bilaterally, CTAB RRR,No Gallops,Rubs or new Murmurs, No Parasternal Heave +ve B.Sounds, Abd Soft, No tenderness, No rebound - guarding or rigidity. No Cyanosis, Clubbing or edema.        Data Review:    CBC Recent Labs  Lab 12/01/20 0453 12/02/20 0432 12/03/20 0118 12/04/20 0724 12/06/20 0232 12/07/20 0642  WBC 16.5* 17.2* 16.4* 12.0* 8.8 9.9  HGB 12.3 10.8* 11.3* 11.4* 13.2 12.9  HCT 37.6 32.5* 36.5 36.8 41.3 42.4  PLT 319 412* 437* 482* 408* 438*  MCV 77.4* 76.8* 78.3* 79.8* 78.8* 80.3  MCH 25.3* 25.5* 24.2* 24.7* 25.2* 24.4*  MCHC 32.7 33.2 31.0 31.0 32.0 30.4  RDW 16.8* 17.1* 17.0* 17.4* 18.5* 18.0*  LYMPHSABS 1.4 1.6 1.3 2.2 1.7  --   MONOABS 1.0 0.7 0.7 0.6 0.9  --   EOSABS 0.0 0.1 0.1 0.2 0.1  --   BASOSABS 0.0 0.0 0.1 0.1 0.1  --     Recent Labs  Lab 12/03/20 0118 12/04/20 0724 12/05/20 0058 12/06/20 0232 12/07/20 0642  NA 134* 136 138 136 135  K 3.6 3.5 4.0 4.3 4.1  CL 102 103 107 106 106  CO2 22 20* 11* 21* 19*  GLUCOSE 162* 120* 192* 216* 207*  BUN $Re'11 15 15 22 22  'WsC$ CREATININE  0.70 0.69 0.63 0.90 0.76  CALCIUM 8.5* 8.8* 9.2 8.6* 8.7*  AST $Re'16 16 17 16  'VkH$ --   ALT $Re'18 20 20 15  'xYH$ --   ALKPHOS 82 87 84 67  --   BILITOT 0.9 0.7 0.7 0.6  --   ALBUMIN 2.5* 2.8* 2.9* 2.5*  --   MG 2.3 2.3 2.3 2.3  --   CRP 5.9* 1.6* 0.8 0.7  --   PROCALCITON <0.10 0.12 <  0.10  --   --   BNP 386.1* 174.0* 110.0* 37.1  --     ------------------------------------------------------------------------------------------------------------------ No results for input(s): CHOL, HDL, LDLCALC, TRIG, CHOLHDL, LDLDIRECT in the last 72 hours.  Lab Results  Component Value Date   HGBA1C 10.6 (H) 11/25/2020   ------------------------------------------------------------------------------------------------------------------ No results for input(s): TSH, T4TOTAL, T3FREE, THYROIDAB in the last 72 hours.  Invalid input(s): FREET3  Cardiac Enzymes No results for input(s): CKMB, TROPONINI, MYOGLOBIN in the last 168 hours.  Invalid input(s): CK ------------------------------------------------------------------------------------------------------------------    Component Value Date/Time   BNP 37.1 12/06/2020 0232    Micro Results No results found for this or any previous visit (from the past 240 hour(s)).  Radiology Reports CT HEAD WO CONTRAST  Result Date: 12/06/2020 CLINICAL DATA:  Stroke follow-up EXAM: CT HEAD WITHOUT CONTRAST TECHNIQUE: Contiguous axial images were obtained from the base of the skull through the vertex without intravenous contrast. COMPARISON:  12/02/2020 FINDINGS: Brain: Expected evolution of left MCA territory infarct with decreased amount of cytotoxic edema. No hemorrhage. There is no midline shift or other mass effect. Severe chronic white matter disease is unchanged. No hydrocephalus. Vascular: No hyperdense vessel or unexpected calcification. Skull: Normal. Negative for fracture or focal lesion. Sinuses/Orbits: No acute finding. Other: None. IMPRESSION: Decreased edema at site  of left MCA territory infarct without hemorrhage or mass effect. Electronically Signed   By: Ulyses Jarred M.D.   On: 12/06/2020 00:28   CT HEAD WO CONTRAST  Result Date: 12/02/2020 CLINICAL DATA:  Stroke follow-up EXAM: CT HEAD WITHOUT CONTRAST TECHNIQUE: Contiguous axial images were obtained from the base of the skull through the vertex without intravenous contrast. COMPARISON:  Brain MRI 11/25/2020 FINDINGS: Brain: Large superior division left MCA territory infarct matching the area of diffusion abnormality on prior. Mild petechial hemorrhage is present and non progressed from MRI. No midline shift. Background of chronic small vessel ischemia in the cerebral white matter. No hydrocephalus or masslike finding. Vascular: Unchanged Skull: Normal. Negative for fracture or focal lesion. Sinuses/Orbits: Negative IMPRESSION: 1. Large recent left MCA territory infarct with mild petechial hemorrhage. No progression since brain MRI 11/25/2020. 2. Extensive chronic small vessel ischemia. Electronically Signed   By: Monte Fantasia M.D.   On: 12/02/2020 11:14   MR BRAIN WO CONTRAST  Result Date: 11/25/2020 CLINICAL DATA:  Acute stroke presentation 11/24/2020. Left insular infarction. EXAM: MRI HEAD WITHOUT CONTRAST TECHNIQUE: Multiplanar, multiecho pulse sequences of the brain and surrounding structures were obtained without intravenous contrast. COMPARISON:  CT and interventional studies over the last day. FINDINGS: Brain: Diffusion imaging shows acute infarction in the deep insula and left frontoparietal cortical and subcortical brain. Mild swelling in the region. There are either minimal petechial blood products or there is susceptibility artifact related to thrombosed vessels. No frank hematoma. No mass effect or shift. Elsewhere, the brainstem and cerebellum are unremarkable. Both cerebral hemispheres show moderate to severe chronic small-vessel ischemic changes throughout the white matter. No hydrocephalus. No  extra-axial collection. Vascular: Major vessels at base of the brain show flow. Skull and upper cervical spine: Negative Sinuses/Orbits: Mucosal inflammatory changes of paranasal sinuses. Orbits negative. Other: None IMPRESSION: 1. Acute infarction in the left MCA territory affecting the deep insula and left frontoparietal cortical and subcortical brain. Minimal petechial blood products or susceptibility artifact related to thrombosed vessels. No frank hematoma. No mass effect or shift. 2. Moderate to severe chronic small-vessel ischemic changes elsewhere throughout the cerebral hemispheric white matter. Electronically Signed   By: Elta Guadeloupe  Shogry M.D.   On: 11/25/2020 02:18   IR CT Head Ltd  Result Date: 11/27/2020 INDICATION: 70 year old female with past medical history significant for rheumatoid arthritis, diabetes mellitus, hypertension, anemia and melanoma presenting with acute onset of right hemiparesis, aphasia and left gaze preference. She was found to be covered positive today. Last known well at 10:30 a.m. on 11/24/2020; modified Rankin scale 0; NIHSS 7. No IV tPA given as she was outside the window. Head CT showed an acute infarct the in the left insula and CT angiogram showed intracranial atherosclerotic disease with a left M2/MCA occlusion. EXAM: Ultrasound-guided vascular access Diagnostic cerebral angiogram Mechanical thrombectomy Flat panel head CT COMPARISON:  CT/CT angiogram of the head and neck November 24, 2020. MEDICATIONS: Refer to anesthesia documentation. ANESTHESIA/SEDATION: The procedure was performed under general anesthesia CONTRAST:  For 75 mL of Omnipaque 240 mg/mL FLUOROSCOPY TIME:  Fluoroscopy Time: 24 minutes 54 seconds (1,520 mGy). COMPLICATIONS: SIR Level A - No therapy, no consequence. TECHNIQUE: Informed written consent was obtained from the patient's son after a thorough discussion of the procedural risks, benefits and alternatives. All questions were addressed. Maximal Sterile  Barrier Technique was utilized including caps, mask, sterile gowns, sterile gloves, sterile drape, hand hygiene and skin antiseptic. A timeout was performed prior to the initiation of the procedure. The right groin was prepped and draped in the usual sterile fashion. Using a micropuncture kit and the modified Seldinger technique, access was gained to the right common femoral artery and an 8 French sheath was placed. Real-time ultrasound guidance was utilized for vascular access including the acquisition of a permanent ultrasound image documenting patency of the accessed vessel. Under fluoroscopy, a Zoom 88 guide catheter was navigated over a 6 [place] Berenstein 2 catheter and a 0.035" Terumo Glidewire into the aortic arch. The catheter was placed into the left common carotid artery and then advanced into the left internal carotid artery. The inner catheter was removed. Frontal and lateral angiograms of the head were obtained. FINDINGS: 1. Occlusion of the mid left M2/MCA middle division branch. 2. Intracranial atherosclerotic disease with multiple luminal irregularities along the left MCA and left ACA vascular tree more significant at the A3 segment where there is moderate stenosis. 3. Patent right common femoral artery. PROCEDURE: Under biplane roadmap, a zoom 55 aspiration catheter was navigated over an Aristotle 18 microguidewire into the cavernous segment of the left ICA and then advanced to the level of occlusion. The aspiration catheter was connected to a penumbra aspiration pump. The guiding catheter balloon was inflated. The aspiration catheter were removed under constant aspiration. Follow-up left ICA angiograms showed persistent occlusion which appear to have progressed more distally. Under biplane roadmap, a zoom 55 aspiration catheter was navigated over an Aristotle 18 microguidewire into the cavernous segment of the left ICA and then advanced to the level of occlusion. The aspiration catheter was  connected to a penumbra aspiration pump. The guiding catheter balloon was inflated. The aspiration catheter were removed under constant aspiration. Follow-up left ICA angiograms showed no significant change. Under biplane roadmap, a zoom 55 aspiration catheter was navigated over a phenom 21 microcatheter and a Aristotle 18 microguidewire into the cavernous segment of the right ICA. The microcatheter was then navigated over the wire into the left M3/MCA middle division branch. Then, a 4 x 40 mm solitaire stent retriever was deployed spanning the left M2 and proximal M3 segment. The device was allowed to intercalated with the clot for 4 minutes. The microcatheter was removed. The  aspiration catheter was advanced to the level of occlusion and connected to a penumbra aspiration pump. The thrombectomy device and aspiration catheter were removed under constant aspiration. Follow-up left ICA angiogram showed significant change. Under biplane roadmap, a zoom 55 aspiration catheter was navigated over a phenom 21 microcatheter and a Aristotle 18 microguidewire into the cavernous segment of the right ICA. The microcatheter was then navigated over the wire into the left M3/MCA middle division branch. Then, a 4 x 40 mm solitaire stent retriever was deployed spanning the left M2 and proximal M3 segment. The device was allowed to intercalated with the clot for 4 minutes. The microcatheter was removed. The aspiration catheter was advanced to the level of occlusion and connected to a penumbra aspiration pump. The thrombectomy device and aspiration catheter were removed under constant aspiration. Follow-up angiogram showed persistent occlusion. Flat panel CT of the head was obtained and post processed in a separate workstation with concurrent attending physician supervision. Selected images were sent to PACS. Small contrast extravasation in the left sylvian fissure noted. Follow-up angiogram showed no evidence of active contrast  extravasation with persistent occlusion of a left M2 branch. Delay flat panel CT of the head was obtained and post processed in a separate workstation with concurrent attending physician supervision. Selected images were sent to PACS. Left sylvian contrast extravasation appears stable. Delayed left ICA angiograms confirmed no evidence of contrast extravasation. The catheter was subsequently withdrawn. Left common femoral artery angiograms were obtained in frontal and lateral views. The puncture is at the level of the common femoral artery which has adequate caliber for closure device. The sheath was exchanged for a Perclose ProGlide which was utilized for access closure. Immediate hemostasis was achieved. IMPRESSION: Unsuccessful attempted recanalization of a mid left M2/MCA middle division branch complicated by small amount contrast extravasation in the left sylvian fissure. No active extravasation seen at the end of the procedure. Left MCA status remains TICI2b, which is similar to presentation. PLAN: Patient is to remain intubated given unsuccessful recanalization and COVID positive test. She will be transferred to ICU for continued care. Electronically Signed   By: Pedro Earls M.D.   On: 11/27/2020 09:25   IR CT Head Ltd  Result Date: 11/27/2020 INDICATION: 70 year old female with past medical history significant for rheumatoid arthritis, diabetes mellitus, hypertension, anemia and melanoma presenting with acute onset of right hemiparesis, aphasia and left gaze preference. She was found to be covered positive today. Last known well at 10:30 a.m. on 11/24/2020; modified Rankin scale 0; NIHSS 7. No IV tPA given as she was outside the window. Head CT showed an acute infarct the in the left insula and CT angiogram showed intracranial atherosclerotic disease with a left M2/MCA occlusion. EXAM: Ultrasound-guided vascular access Diagnostic cerebral angiogram Mechanical thrombectomy Flat panel head  CT COMPARISON:  CT/CT angiogram of the head and neck November 24, 2020. MEDICATIONS: Refer to anesthesia documentation. ANESTHESIA/SEDATION: The procedure was performed under general anesthesia CONTRAST:  For 75 mL of Omnipaque 240 mg/mL FLUOROSCOPY TIME:  Fluoroscopy Time: 24 minutes 54 seconds (1,520 mGy). COMPLICATIONS: SIR Level A - No therapy, no consequence. TECHNIQUE: Informed written consent was obtained from the patient's son after a thorough discussion of the procedural risks, benefits and alternatives. All questions were addressed. Maximal Sterile Barrier Technique was utilized including caps, mask, sterile gowns, sterile gloves, sterile drape, hand hygiene and skin antiseptic. A timeout was performed prior to the initiation of the procedure. The right groin was prepped and draped  in the usual sterile fashion. Using a micropuncture kit and the modified Seldinger technique, access was gained to the right common femoral artery and an 8 French sheath was placed. Real-time ultrasound guidance was utilized for vascular access including the acquisition of a permanent ultrasound image documenting patency of the accessed vessel. Under fluoroscopy, a Zoom 88 guide catheter was navigated over a 6 [place] Berenstein 2 catheter and a 0.035" Terumo Glidewire into the aortic arch. The catheter was placed into the left common carotid artery and then advanced into the left internal carotid artery. The inner catheter was removed. Frontal and lateral angiograms of the head were obtained. FINDINGS: 1. Occlusion of the mid left M2/MCA middle division branch. 2. Intracranial atherosclerotic disease with multiple luminal irregularities along the left MCA and left ACA vascular tree more significant at the A3 segment where there is moderate stenosis. 3. Patent right common femoral artery. PROCEDURE: Under biplane roadmap, a zoom 55 aspiration catheter was navigated over an Aristotle 18 microguidewire into the cavernous segment of  the left ICA and then advanced to the level of occlusion. The aspiration catheter was connected to a penumbra aspiration pump. The guiding catheter balloon was inflated. The aspiration catheter were removed under constant aspiration. Follow-up left ICA angiograms showed persistent occlusion which appear to have progressed more distally. Under biplane roadmap, a zoom 55 aspiration catheter was navigated over an Aristotle 18 microguidewire into the cavernous segment of the left ICA and then advanced to the level of occlusion. The aspiration catheter was connected to a penumbra aspiration pump. The guiding catheter balloon was inflated. The aspiration catheter were removed under constant aspiration. Follow-up left ICA angiograms showed no significant change. Under biplane roadmap, a zoom 55 aspiration catheter was navigated over a phenom 21 microcatheter and a Aristotle 18 microguidewire into the cavernous segment of the right ICA. The microcatheter was then navigated over the wire into the left M3/MCA middle division branch. Then, a 4 x 40 mm solitaire stent retriever was deployed spanning the left M2 and proximal M3 segment. The device was allowed to intercalated with the clot for 4 minutes. The microcatheter was removed. The aspiration catheter was advanced to the level of occlusion and connected to a penumbra aspiration pump. The thrombectomy device and aspiration catheter were removed under constant aspiration. Follow-up left ICA angiogram showed significant change. Under biplane roadmap, a zoom 55 aspiration catheter was navigated over a phenom 21 microcatheter and a Aristotle 18 microguidewire into the cavernous segment of the right ICA. The microcatheter was then navigated over the wire into the left M3/MCA middle division branch. Then, a 4 x 40 mm solitaire stent retriever was deployed spanning the left M2 and proximal M3 segment. The device was allowed to intercalated with the clot for 4 minutes. The  microcatheter was removed. The aspiration catheter was advanced to the level of occlusion and connected to a penumbra aspiration pump. The thrombectomy device and aspiration catheter were removed under constant aspiration. Follow-up angiogram showed persistent occlusion. Flat panel CT of the head was obtained and post processed in a separate workstation with concurrent attending physician supervision. Selected images were sent to PACS. Small contrast extravasation in the left sylvian fissure noted. Follow-up angiogram showed no evidence of active contrast extravasation with persistent occlusion of a left M2 branch. Delay flat panel CT of the head was obtained and post processed in a separate workstation with concurrent attending physician supervision. Selected images were sent to PACS. Left sylvian contrast extravasation appears stable. Delayed  left ICA angiograms confirmed no evidence of contrast extravasation. The catheter was subsequently withdrawn. Left common femoral artery angiograms were obtained in frontal and lateral views. The puncture is at the level of the common femoral artery which has adequate caliber for closure device. The sheath was exchanged for a Perclose ProGlide which was utilized for access closure. Immediate hemostasis was achieved. IMPRESSION: Unsuccessful attempted recanalization of a mid left M2/MCA middle division branch complicated by small amount contrast extravasation in the left sylvian fissure. No active extravasation seen at the end of the procedure. Left MCA status remains TICI2b, which is similar to presentation. PLAN: Patient is to remain intubated given unsuccessful recanalization and COVID positive test. She will be transferred to ICU for continued care. Electronically Signed   By: Pedro Earls M.D.   On: 11/27/2020 09:25   IR US Guide Vasc Access Right  Result Date: 11/27/2020 INDICATION: 70 year old female with past medical history significant for  rheumatoid arthritis, diabetes mellitus, hypertension, anemia and melanoma presenting with acute onset of right hemiparesis, aphasia and left gaze preference. She was found to be covered positive today. Last known well at 10:30 a.m. on 11/24/2020; modified Rankin scale 0; NIHSS 7. No IV tPA given as she was outside the window. Head CT showed an acute infarct the in the left insula and CT angiogram showed intracranial atherosclerotic disease with a left M2/MCA occlusion. EXAM: Ultrasound-guided vascular access Diagnostic cerebral angiogram Mechanical thrombectomy Flat panel head CT COMPARISON:  CT/CT angiogram of the head and neck November 24, 2020. MEDICATIONS: Refer to anesthesia documentation. ANESTHESIA/SEDATION: The procedure was performed under general anesthesia CONTRAST:  For 75 mL of Omnipaque 240 mg/mL FLUOROSCOPY TIME:  Fluoroscopy Time: 24 minutes 54 seconds (1,520 mGy). COMPLICATIONS: SIR Level A - No therapy, no consequence. TECHNIQUE: Informed written consent was obtained from the patient's son after a thorough discussion of the procedural risks, benefits and alternatives. All questions were addressed. Maximal Sterile Barrier Technique was utilized including caps, mask, sterile gowns, sterile gloves, sterile drape, hand hygiene and skin antiseptic. A timeout was performed prior to the initiation of the procedure. The right groin was prepped and draped in the usual sterile fashion. Using a micropuncture kit and the modified Seldinger technique, access was gained to the right common femoral artery and an 8 French sheath was placed. Real-time ultrasound guidance was utilized for vascular access including the acquisition of a permanent ultrasound image documenting patency of the accessed vessel. Under fluoroscopy, a Zoom 88 guide catheter was navigated over a 6 [place] Berenstein 2 catheter and a 0.035" Terumo Glidewire into the aortic arch. The catheter was placed into the left common carotid artery and  then advanced into the left internal carotid artery. The inner catheter was removed. Frontal and lateral angiograms of the head were obtained. FINDINGS: 1. Occlusion of the mid left M2/MCA middle division branch. 2. Intracranial atherosclerotic disease with multiple luminal irregularities along the left MCA and left ACA vascular tree more significant at the A3 segment where there is moderate stenosis. 3. Patent right common femoral artery. PROCEDURE: Under biplane roadmap, a zoom 55 aspiration catheter was navigated over an Aristotle 18 microguidewire into the cavernous segment of the left ICA and then advanced to the level of occlusion. The aspiration catheter was connected to a penumbra aspiration pump. The guiding catheter balloon was inflated. The aspiration catheter were removed under constant aspiration. Follow-up left ICA angiograms showed persistent occlusion which appear to have progressed more distally. Under biplane roadmap, a  zoom 55 aspiration catheter was navigated over an Aristotle 18 microguidewire into the cavernous segment of the left ICA and then advanced to the level of occlusion. The aspiration catheter was connected to a penumbra aspiration pump. The guiding catheter balloon was inflated. The aspiration catheter were removed under constant aspiration. Follow-up left ICA angiograms showed no significant change. Under biplane roadmap, a zoom 55 aspiration catheter was navigated over a phenom 21 microcatheter and a Aristotle 18 microguidewire into the cavernous segment of the right ICA. The microcatheter was then navigated over the wire into the left M3/MCA middle division branch. Then, a 4 x 40 mm solitaire stent retriever was deployed spanning the left M2 and proximal M3 segment. The device was allowed to intercalated with the clot for 4 minutes. The microcatheter was removed. The aspiration catheter was advanced to the level of occlusion and connected to a penumbra aspiration pump. The  thrombectomy device and aspiration catheter were removed under constant aspiration. Follow-up left ICA angiogram showed significant change. Under biplane roadmap, a zoom 55 aspiration catheter was navigated over a phenom 21 microcatheter and a Aristotle 18 microguidewire into the cavernous segment of the right ICA. The microcatheter was then navigated over the wire into the left M3/MCA middle division branch. Then, a 4 x 40 mm solitaire stent retriever was deployed spanning the left M2 and proximal M3 segment. The device was allowed to intercalated with the clot for 4 minutes. The microcatheter was removed. The aspiration catheter was advanced to the level of occlusion and connected to a penumbra aspiration pump. The thrombectomy device and aspiration catheter were removed under constant aspiration. Follow-up angiogram showed persistent occlusion. Flat panel CT of the head was obtained and post processed in a separate workstation with concurrent attending physician supervision. Selected images were sent to PACS. Small contrast extravasation in the left sylvian fissure noted. Follow-up angiogram showed no evidence of active contrast extravasation with persistent occlusion of a left M2 branch. Delay flat panel CT of the head was obtained and post processed in a separate workstation with concurrent attending physician supervision. Selected images were sent to PACS. Left sylvian contrast extravasation appears stable. Delayed left ICA angiograms confirmed no evidence of contrast extravasation. The catheter was subsequently withdrawn. Left common femoral artery angiograms were obtained in frontal and lateral views. The puncture is at the level of the common femoral artery which has adequate caliber for closure device. The sheath was exchanged for a Perclose ProGlide which was utilized for access closure. Immediate hemostasis was achieved. IMPRESSION: Unsuccessful attempted recanalization of a mid left M2/MCA middle  division branch complicated by small amount contrast extravasation in the left sylvian fissure. No active extravasation seen at the end of the procedure. Left MCA status remains TICI2b, which is similar to presentation. PLAN: Patient is to remain intubated given unsuccessful recanalization and COVID positive test. She will be transferred to ICU for continued care. Electronically Signed   By: Pedro Earls M.D.   On: 11/27/2020 09:25   CT CEREBRAL PERFUSION W CONTRAST  Result Date: 11/24/2020 CLINICAL DATA:  Neuro deficit, acute, stroke suspected EXAM: CT PERFUSION BRAIN TECHNIQUE: Multiphase CT imaging of the brain was performed following IV bolus contrast injection. Subsequent parametric perfusion maps were calculated using RAPID software. CONTRAST:  45mL OMNIPAQUE IOHEXOL 350 MG/ML SOLN COMPARISON:  Concurrent CTA head and neck. FINDINGS: CT Brain Perfusion Findings: CBF (<30%) Volume: 33mL Perfusion (Tmax>6.0s) volume: 61mL Mismatch Volume: 53mL ASPECTS on noncontrast CT Head: 9 at 16:42 today.  Infarct Core: 0 mL Infarction Location:Not applicable. IMPRESSION: Perfusion imaging demonstrates no infarct core. 30 mL region of ischemia involving the left MCA territory. Electronically Signed   By: Primitivo Gauze M.D.   On: 11/24/2020 16:55   DG Chest Port 1 View  Result Date: 12/03/2020 CLINICAL DATA:  Shortness of breath.  COVID-19 positive EXAM: PORTABLE CHEST 1 VIEW COMPARISON:  December 02, 2020 FINDINGS: Patchy airspace opacity is noted in the lung bases, more on the right than on the left. Lungs elsewhere clear. Heart is upper normal in size with pulmonary vascularity normal. No adenopathy. There is aortic atherosclerosis. There is calcification in each carotid artery. No bone lesions. IMPRESSION: Persistent airspace opacity bases, more on the right than the left, likely due to atypical organism pneumonia. No new opacity evident. Stable cardiac silhouette. Aortic Atherosclerosis  (ICD10-I70.0). There are foci of carotid artery calcification bilaterally. Electronically Signed   By: Lowella Grip III M.D.   On: 12/03/2020 08:41   DG Chest Port 1 View  Result Date: 12/02/2020 CLINICAL DATA:  Shortness of breath EXAM: PORTABLE CHEST 1 VIEW COMPARISON:  November 24, 2020 FINDINGS: The cardiomediastinal silhouette is unchanged in contour.Interval extubation. Atherosclerotic calcifications of the aorta. No pleural effusion. No pneumothorax. Patchy RIGHT basilar peripheral opacities, similar in comparison to prior. Mildly improved aeration of the LEFT lung base in comparison to prior. Visualized abdomen is unremarkable. Multilevel degenerative changes of the thoracic spine. IMPRESSION: 1. Patchy RIGHT basilar peripheral opacities, similar in comparison to prior and consistent with the sequela of COVID-19 infection. 2. Mildly improved aeration of the LEFT lung base in comparison to prior. Electronically Signed   By: Valentino Saxon MD   On: 12/02/2020 11:26   DG Chest Port 1 View  Result Date: 11/24/2020 CLINICAL DATA:  COVID positive EXAM: PORTABLE CHEST 1 VIEW COMPARISON:  None. FINDINGS: The heart size and mediastinal contours are mildly enlarged. ETT is 4 cm above the carina. NG tube is seen below the diaphragm. Aortic knob calcifications are seen. There is hazy patchy airspace opacity seen at the periphery of the right lower lung and at the left lung base. No pleural effusion. IMPRESSION: ETT and NG tube in satisfactory position Bilateral multifocal airspace opacities, consistent with multifocal pneumonia Electronically Signed   By: Prudencio Pair M.D.   On: 11/24/2020 22:14   IR PERCUTANEOUS ART THROMBECTOMY/INFUSION INTRACRANIAL INC DIAG ANGIO  Result Date: 11/27/2020 INDICATION: 69 year old female with past medical history significant for rheumatoid arthritis, diabetes mellitus, hypertension, anemia and melanoma presenting with acute onset of right hemiparesis, aphasia and left  gaze preference. She was found to be covered positive today. Last known well at 10:30 a.m. on 11/24/2020; modified Rankin scale 0; NIHSS 7. No IV tPA given as she was outside the window. Head CT showed an acute infarct the in the left insula and CT angiogram showed intracranial atherosclerotic disease with a left M2/MCA occlusion. EXAM: Ultrasound-guided vascular access Diagnostic cerebral angiogram Mechanical thrombectomy Flat panel head CT COMPARISON:  CT/CT angiogram of the head and neck November 24, 2020. MEDICATIONS: Refer to anesthesia documentation. ANESTHESIA/SEDATION: The procedure was performed under general anesthesia CONTRAST:  For 75 mL of Omnipaque 240 mg/mL FLUOROSCOPY TIME:  Fluoroscopy Time: 24 minutes 54 seconds (1,520 mGy). COMPLICATIONS: SIR Level A - No therapy, no consequence. TECHNIQUE: Informed written consent was obtained from the patient's son after a thorough discussion of the procedural risks, benefits and alternatives. All questions were addressed. Maximal Sterile Barrier Technique was utilized including caps, mask,  sterile gowns, sterile gloves, sterile drape, hand hygiene and skin antiseptic. A timeout was performed prior to the initiation of the procedure. The right groin was prepped and draped in the usual sterile fashion. Using a micropuncture kit and the modified Seldinger technique, access was gained to the right common femoral artery and an 8 French sheath was placed. Real-time ultrasound guidance was utilized for vascular access including the acquisition of a permanent ultrasound image documenting patency of the accessed vessel. Under fluoroscopy, a Zoom 88 guide catheter was navigated over a 6 [place] Berenstein 2 catheter and a 0.035" Terumo Glidewire into the aortic arch. The catheter was placed into the left common carotid artery and then advanced into the left internal carotid artery. The inner catheter was removed. Frontal and lateral angiograms of the head were obtained.  FINDINGS: 1. Occlusion of the mid left M2/MCA middle division branch. 2. Intracranial atherosclerotic disease with multiple luminal irregularities along the left MCA and left ACA vascular tree more significant at the A3 segment where there is moderate stenosis. 3. Patent right common femoral artery. PROCEDURE: Under biplane roadmap, a zoom 55 aspiration catheter was navigated over an Aristotle 18 microguidewire into the cavernous segment of the left ICA and then advanced to the level of occlusion. The aspiration catheter was connected to a penumbra aspiration pump. The guiding catheter balloon was inflated. The aspiration catheter were removed under constant aspiration. Follow-up left ICA angiograms showed persistent occlusion which appear to have progressed more distally. Under biplane roadmap, a zoom 55 aspiration catheter was navigated over an Aristotle 18 microguidewire into the cavernous segment of the left ICA and then advanced to the level of occlusion. The aspiration catheter was connected to a penumbra aspiration pump. The guiding catheter balloon was inflated. The aspiration catheter were removed under constant aspiration. Follow-up left ICA angiograms showed no significant change. Under biplane roadmap, a zoom 55 aspiration catheter was navigated over a phenom 21 microcatheter and a Aristotle 18 microguidewire into the cavernous segment of the right ICA. The microcatheter was then navigated over the wire into the left M3/MCA middle division branch. Then, a 4 x 40 mm solitaire stent retriever was deployed spanning the left M2 and proximal M3 segment. The device was allowed to intercalated with the clot for 4 minutes. The microcatheter was removed. The aspiration catheter was advanced to the level of occlusion and connected to a penumbra aspiration pump. The thrombectomy device and aspiration catheter were removed under constant aspiration. Follow-up left ICA angiogram showed significant change. Under  biplane roadmap, a zoom 55 aspiration catheter was navigated over a phenom 21 microcatheter and a Aristotle 18 microguidewire into the cavernous segment of the right ICA. The microcatheter was then navigated over the wire into the left M3/MCA middle division branch. Then, a 4 x 40 mm solitaire stent retriever was deployed spanning the left M2 and proximal M3 segment. The device was allowed to intercalated with the clot for 4 minutes. The microcatheter was removed. The aspiration catheter was advanced to the level of occlusion and connected to a penumbra aspiration pump. The thrombectomy device and aspiration catheter were removed under constant aspiration. Follow-up angiogram showed persistent occlusion. Flat panel CT of the head was obtained and post processed in a separate workstation with concurrent attending physician supervision. Selected images were sent to PACS. Small contrast extravasation in the left sylvian fissure noted. Follow-up angiogram showed no evidence of active contrast extravasation with persistent occlusion of a left M2 branch. Delay flat panel CT of  the head was obtained and post processed in a separate workstation with concurrent attending physician supervision. Selected images were sent to PACS. Left sylvian contrast extravasation appears stable. Delayed left ICA angiograms confirmed no evidence of contrast extravasation. The catheter was subsequently withdrawn. Left common femoral artery angiograms were obtained in frontal and lateral views. The puncture is at the level of the common femoral artery which has adequate caliber for closure device. The sheath was exchanged for a Perclose ProGlide which was utilized for access closure. Immediate hemostasis was achieved. IMPRESSION: Unsuccessful attempted recanalization of a mid left M2/MCA middle division branch complicated by small amount contrast extravasation in the left sylvian fissure. No active extravasation seen at the end of the  procedure. Left MCA status remains TICI2b, which is similar to presentation. PLAN: Patient is to remain intubated given unsuccessful recanalization and COVID positive test. She will be transferred to ICU for continued care. Electronically Signed   By: Pedro Earls M.D.   On: 11/27/2020 09:25   CT HEAD CODE STROKE WO CONTRAST  Result Date: 11/24/2020 CLINICAL DATA:  Code stroke.  Neuro deficit, acute, stroke suspected EXAM: CT HEAD WITHOUT CONTRAST TECHNIQUE: Contiguous axial images were obtained from the base of the skull through the vertex without intravenous contrast. COMPARISON:  06/27/2020. FINDINGS: Brain: No intracranial hemorrhage. New focal hypodensity involving the left insula. No mass lesion. No midline shift, ventriculomegaly or extra-axial fluid collection. Chronic microvascular ischemic changes. Vascular: No hyperdense vessel or unexpected calcification. Minimal carotid siphon atherosclerotic calcifications. Skull: Negative for fracture or focal lesion. Sinuses/Orbits: No acute finding.  Mild pansinus mucosal thickening. Other: Right parietal scalp nodule is unchanged. ASPECTS [facility] Stroke Program Early CT Score) - Ganglionic level infarction (caudate, lentiform nuclei, internal capsule, insula, M1-M3 cortex): 6 - Supraganglionic infarction (M4-M6 cortex): 3 Total score (0-10 with 10 being normal): 9 IMPRESSION: 1. 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 2. Chronic microvascular ischemic changes. 3. ASPECTS is 9 Code stroke imaging results were communicated on 11/24/2020 at 4:23 pm to provider Dr. Rory Percy via secure text paging. Electronically Signed   By: Primitivo Gauze M.D.   On: 11/24/2020 16:24   VAS [place] LOWER EXTREMITY VENOUS (DVT)  Result Date: 11/26/2020  Lower Venous DVT Study Indications: Stroke, and Covid-19.  Comparison Study: No prior study on file Performing Technologist: Sharion Dove RVS  Examination Guidelines: A complete evaluation includes B-mode  imaging, spectral Doppler, color Doppler, and power Doppler as needed of all accessible portions of each vessel. Bilateral testing is considered an integral part of a complete examination. Limited examinations for reoccurring indications may be performed as noted. The reflux portion of the exam is performed with the patient in reverse Trendelenburg.  +---------+---------------+---------+-----------+----------+--------------+ RIGHT    CompressibilityPhasicitySpontaneityPropertiesThrombus Aging +---------+---------------+---------+-----------+----------+--------------+ CFV      Full           Yes      Yes                                 +---------+---------------+---------+-----------+----------+--------------+ SFJ      Full                                                        +---------+---------------+---------+-----------+----------+--------------+ FV Prox  Full                                                        +---------+---------------+---------+-----------+----------+--------------+  FV Mid   Full                                                        +---------+---------------+---------+-----------+----------+--------------+ FV DistalFull                                                        +---------+---------------+---------+-----------+----------+--------------+ PFV      Full                                                        +---------+---------------+---------+-----------+----------+--------------+ POP      Full           Yes      Yes                                 +---------+---------------+---------+-----------+----------+--------------+ PTV      Full                                                        +---------+---------------+---------+-----------+----------+--------------+ PERO     None                                         Acute          +---------+---------------+---------+-----------+----------+--------------+    +---------+---------------+---------+-----------+----------+--------------+ LEFT     CompressibilityPhasicitySpontaneityPropertiesThrombus Aging +---------+---------------+---------+-----------+----------+--------------+ CFV      Full           Yes      Yes                                 +---------+---------------+---------+-----------+----------+--------------+ SFJ      Full                                                        +---------+---------------+---------+-----------+----------+--------------+ FV Prox  Full                                                        +---------+---------------+---------+-----------+----------+--------------+ FV Mid   Full                                                        +---------+---------------+---------+-----------+----------+--------------+  FV DistalFull                                                        +---------+---------------+---------+-----------+----------+--------------+ PFV      Full                                                        +---------+---------------+---------+-----------+----------+--------------+ POP      Full           Yes      Yes                                 +---------+---------------+---------+-----------+----------+--------------+ PTV      Full                                                        +---------+---------------+---------+-----------+----------+--------------+ PERO     Full                                                        +---------+---------------+---------+-----------+----------+--------------+     Summary: RIGHT: - Findings consistent with acute deep vein thrombosis involving the right peroneal veins.  LEFT: - There is no evidence of deep vein thrombosis in the lower extremity.  *See table(s) above for measurements and observations. Electronically signed by Lemar Livings MD on 11/26/2020 at 5:59:41 PM.    Final    ECHOCARDIOGRAM  LIMITED  Result Date: 11/25/2020    ECHOCARDIOGRAM REPORT   Patient Name:   Emily Lozano [facility] Date of Exam: 11/25/2020 Medical Rec #:  [id number]        Height:       64.0 in Accession #:    [id number]       Weight:       176.4 lb Date of Birth:  [birth year]        BSA:          1.855 m Patient Age:    69 years         BP:           119/66 mmHg Patient Gender: F                HR:           77 bpm. Exam Location:  Inpatient Procedure: Limited Echo, Cardiac Doppler and Color Doppler Indications:    Stroke I63.9  History:        Patient has no prior history of Echocardiogram examinations.                 Risk Factors:Hypertension, Diabetes and Former Smoker.  Sonographer:    Renella Cunas RDCS Referring Phys: [id number] ASHISH ARORA  Sonographer Comments: Echo performed with patient supine and on artificial respirator. Covid positive. IMPRESSIONS  1.  Left ventricular ejection fraction, by estimation, is 55 to 60%. The left ventricle has normal function. The left ventricle has no regional wall motion abnormalities. Left ventricular diastolic parameters are consistent with Grade I diastolic dysfunction (impaired relaxation).  2. Right ventricular systolic function is normal. The right ventricular size is normal.  3. The mitral valve is normal in structure. Trivial mitral valve regurgitation. No evidence of mitral stenosis.  4. The aortic valve is tricuspid. Aortic valve regurgitation is trivial. Mild aortic valve sclerosis is present, with no evidence of aortic valve stenosis.  5. The inferior vena cava is normal in size with greater than 50% respiratory variability, suggesting right atrial pressure of 3 mmHg. FINDINGS  Left Ventricle: Left ventricular ejection fraction, by estimation, is 55 to 60%. The left ventricle has normal function. The left ventricle has no regional wall motion abnormalities. The left ventricular internal cavity size was normal in size. There is  no left ventricular hypertrophy. Left ventricular  diastolic parameters are consistent with Grade I diastolic dysfunction (impaired relaxation). Right Ventricle: The right ventricular size is normal.Right ventricular systolic function is normal. Left Atrium: Left atrial size was normal in size. Right Atrium: Right atrial size was normal in size. Pericardium: There is no evidence of pericardial effusion. Mitral Valve: The mitral valve is normal in structure. Trivial mitral valve regurgitation. No evidence of mitral valve stenosis. Tricuspid Valve: The tricuspid valve is normal in structure. Tricuspid valve regurgitation is trivial. No evidence of tricuspid stenosis. Aortic Valve: The aortic valve is tricuspid. Aortic valve regurgitation is trivial. Mild aortic valve sclerosis is present, with no evidence of aortic valve stenosis. Pulmonic Valve: The pulmonic valve was not well visualized. Pulmonic valve regurgitation is trivial. No evidence of pulmonic stenosis. Aorta: The aortic root is normal in size and structure. Venous: The inferior vena cava is normal in size with greater than 50% respiratory variability, suggesting right atrial pressure of 3 mmHg.  LEFT VENTRICLE PLAX 2D LVIDd:         4.10 cm     Diastology LVIDs:         3.20 cm     LV e' medial:    6.09 cm/s LV PW:         1.00 cm     LV E/e' medial:  11.1 LV IVS:        0.90 cm     LV e' lateral:   9.03 cm/s LVOT diam:     2.00 cm     LV E/e' lateral: 7.5 LV SV:         42 LV SV Index:   23 LVOT Area:     3.14 cm  LV Volumes (MOD) LV vol d, MOD A2C: 88.5 ml LV vol d, MOD A4C: 80.0 ml LV vol s, MOD A2C: 46.6 ml LV vol s, MOD A4C: 41.6 ml LV SV MOD A2C:     41.9 ml LV SV MOD A4C:     80.0 ml LV SV MOD BP:      40.6 ml RIGHT VENTRICLE RV S prime:     13.80 cm/s TAPSE (M-mode): 1.5 cm LEFT ATRIUM         Index LA diam:    2.70 cm 1.46 cm/m  AORTIC VALVE LVOT Vmax:   71.80 cm/s LVOT Vmean:  56.700 cm/s LVOT VTI:    0.134 m  AORTA Ao Root diam: 3.70 cm MITRAL VALVE MV Area (PHT): 3.85 cm    SHUNTS MV Decel  Time:  197 msec    Systemic VTI:  0.13 m MV E velocity: 67.70 cm/s  Systemic Diam: 2.00 cm MV A velocity: 81.00 cm/s MV E/A ratio:  0.84 Olga Millers MD Electronically signed by Olga Millers MD Signature Date/Time: 11/25/2020/10:54:57 AM    Final    CT ANGIO HEAD CODE STROKE  Result Date: 11/24/2020 CLINICAL DATA:  Neuro deficit, acute, stroke suspected EXAM: CT ANGIOGRAPHY HEAD AND NECK TECHNIQUE: Multidetector CT imaging of the head and neck was performed using the standard protocol during bolus administration of intravenous contrast. Multiplanar CT image reconstructions and MIPs were obtained to evaluate the vascular anatomy. Carotid stenosis measurements (when applicable) are obtained utilizing NASCET criteria, using the distal internal carotid diameter as the denominator. CONTRAST:  18mL OMNIPAQUE IOHEXOL 350 MG/ML SOLN COMPARISON:  None. FINDINGS: CTA NECK FINDINGS Aortic arch: Standard branching. Mild aortic arch atherosclerotic calcifications. Mild proximal left subclavian artery narrowing secondary to atheromatous plaque. Otherwise patent great vessel origins. Right carotid system: Patent. Minimal proximal ICA atherosclerotic calcifications without significant narrowing. Left carotid system: Patent. 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 Vertebral arteries: Mild left vertebral artery origin narrowing secondary to atheromatous disease. Otherwise patent and codominant. Skeleton: No acute finding.  Mild spondylosis. Other neck: No adenopathy.  No soft tissue mass. Upper chest: Patchy and nodular bilateral upper lung opacities concerning for infectious/inflammatory foci. Review of the MIP images confirms the above findings CTA HEAD FINDINGS Anterior circulation: Minimal carotid siphon atherosclerotic calcifications. Patent ICAs. Patent ophthalmic artery origins. Right A1 segment hypoplasia. Patent ACAs. Patent right MCA. Left M2 segment occlusion. Posterior circulation: Patent V4  segments. Patent right PICA. Patent basilar and superior cerebellar arteries. Patent bilateral PCAs. Venous sinuses: As permitted by contrast timing, patent. Anatomic variants: Please see above. Review of the MIP images confirms the above findings IMPRESSION: CTA neck: 30-40% distal left CCA narrowing secondary to atheromatous plaque. Mild narrowing of the proximal left subclavian and left vertebral artery origin. Bilateral upper lung nodular/patchy opacities concerning for infectious/inflammatory foci. CTA head: Left M2 branch occlusion. No aneurysm or dissection. Code stroke imaging results were communicated on 11/24/2020 at 4:41 pm to provider Dr. Wilford Corner via secure text paging. Electronically Signed   By: Stana Bunting M.D.   On: 11/24/2020 16:41   CT ANGIO NECK CODE STROKE  Result Date: 11/24/2020 CLINICAL DATA:  Neuro deficit, acute, stroke suspected EXAM: CT ANGIOGRAPHY HEAD AND NECK TECHNIQUE: Multidetector CT imaging of the head and neck was performed using the standard protocol during bolus administration of intravenous contrast. Multiplanar CT image reconstructions and MIPs were obtained to evaluate the vascular anatomy. Carotid stenosis measurements (when applicable) are obtained utilizing NASCET criteria, using the distal internal carotid diameter as the denominator. CONTRAST:  69mL OMNIPAQUE IOHEXOL 350 MG/ML SOLN COMPARISON:  None. FINDINGS: CTA NECK FINDINGS Aortic arch: Standard branching. Mild aortic arch atherosclerotic calcifications. Mild proximal left subclavian artery narrowing secondary to atheromatous plaque. Otherwise patent great vessel origins. Right carotid system: Patent. Minimal proximal ICA atherosclerotic calcifications without significant narrowing. Left carotid system: Patent. 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 Vertebral arteries: Mild left vertebral artery origin narrowing secondary to atheromatous disease. Otherwise patent and codominant.  Skeleton: No acute finding.  Mild spondylosis. Other neck: No adenopathy.  No soft tissue mass. Upper chest: Patchy and nodular bilateral upper lung opacities concerning for infectious/inflammatory foci. Review of the MIP images confirms the above findings CTA HEAD FINDINGS Anterior circulation: Minimal carotid siphon atherosclerotic calcifications. Patent ICAs. Patent ophthalmic  artery origins. Right A1 segment hypoplasia. Patent ACAs. Patent right MCA. Left M2 segment occlusion. Posterior circulation: Patent V4 segments. Patent right PICA. Patent basilar and superior cerebellar arteries. Patent bilateral PCAs. Venous sinuses: As permitted by contrast timing, patent. Anatomic variants: Please see above. Review of the MIP images confirms the above findings IMPRESSION: CTA neck: 30-40% distal left CCA narrowing secondary to atheromatous plaque. Mild narrowing of the proximal left subclavian and left vertebral artery origin. Bilateral upper lung nodular/patchy opacities concerning for infectious/inflammatory foci. CTA head: Left M2 branch occlusion. No aneurysm or dissection. Code stroke imaging results were communicated on 11/24/2020 at 4:41 pm to provider Dr. Rory Percy via secure text paging. Electronically Signed   By: Primitivo Gauze M.D.   On: 11/24/2020 16:41

## 2020-12-07 NOTE — Progress Notes (Signed)
Physical Therapy Treatment Patient Details Name: Emily Lozano MRN: [id number] DOB: August 26, 1951 Today's Date: 12/07/2020    History of Present Illness 70 y.o. female presents with acute onset R weakness, aphasia, left gaze. CTA demonstrates L M2 occlusion. Pt admitted and underwent thrombectomy on 11/24/2020 (unsuccessful). PMH: significant for hypertension, hyperlipidemia, type 2 diabetes mellitus, depression, GERD, melanoma, sleep apnea, and recent exposure to COVID-19 with a negative test at her PCP yesterday but diagnosed with acute bronchitis.    PT Comments    Pt is making slow, steady progress towards her PT goals. Pt much more awake and participatory today with increased command follow. Pt currently maxAx2 for bed mobility and maxAx2 for sit>stand and pivot to chair. Lift pad in place for nursing to return to bed after dinner. D/c plans remain appropriate especially as participation and mobility is increasing. PT will continue to follow acutely.    Follow Up Recommendations  CIR;Supervision/Assistance - 24 hour     Equipment Recommendations  Other (comment)    Recommendations for Other Services Rehab consult     Precautions / Restrictions Precautions Precautions: Fall Precaution Comments: COVID postive Restrictions Weight Bearing Restrictions: No    Mobility  Bed Mobility Overal bed mobility: Needs Assistance Bed Mobility: Supine to Sit     Supine to sit: +2 for safety/equipment;HOB elevated;Max assist     General bed mobility comments: Min A to bring RLE towards EOB. Max A for bringing hips towards EOB and elevate trunk    Transfers Overall transfer level: Needs assistance Equipment used: 2 person hand held assist Transfers: Sit to/from [facility] Sit to Stand: Max assist;+2 physical assistance;+2 safety/equipment Stand pivot transfers: Max assist;+2 physical assistance;+2 safety/equipment       General transfer comment: Max A x 2 for  stand and pivot to chair. Unable to sequence LEs, R LE unable to support weight well - knee blockinig  Ambulation/Gait             General Gait Details: not able      Modified Rankin (Stroke Patients Only) Modified Rankin (Stroke Patients Only) Pre-Morbid Rankin Score: No symptoms Modified Rankin: Severe disability     Balance Overall balance assessment: Needs assistance Sitting-balance support: Feet supported;Single extremity supported;No upper extremity supported Sitting balance-Leahy Scale: Poor Sitting balance - Comments: Min A for support with R lateral lean   Standing balance support: Bilateral upper extremity supported;During functional activity Standing balance-Leahy Scale: Poor Standing balance comment: Reliant on physical A and blocking of BLEs.                            Cognition Arousal/Alertness: Lethargic Behavior During Therapy: Flat affect Overall Cognitive Status: Difficult to assess Area of Impairment: Attention;Following commands;Awareness;Problem solving;Safety/judgement                   Current Attention Level: Sustained   Following Commands: Follows one step commands inconsistently;Follows one step commands with increased time Safety/Judgement: Decreased awareness of deficits Awareness: Intellectual Problem Solving: Slow processing;Requires verbal cues;Requires tactile cues General Comments: Following simple commands inconsistently. Opening eyes once EOB. At end of session, pt stating "yes i would please" when asked if she wanted a blanket      Exercises Other Exercises Other Exercises: Replacing K tape at R shoulder to prevent subluxation and promote muscle activation    General Comments General comments (skin integrity, edema, etc.): VSS on RA      Pertinent Vitals/Pain  Pain Assessment: Faces Faces Pain Scale: No hurt Pain Location: generalized Pain Descriptors / Indicators: Grimacing;Moaning Pain Intervention(s):  Limited activity within patient's tolerance;Monitored during session;Patient requesting pain meds-RN notified           PT Goals (current goals can now be found in the care plan section) Acute Rehab PT Goals Patient Stated Goal: Pt unstable to state PT Goal Formulation: Patient unable to participate in goal setting Time For Goal Achievement: 12/10/20 Potential to Achieve Goals: Fair Progress towards PT goals: Progressing toward goals    Frequency    Min 3X/week      PT Plan Current plan remains appropriate    Co-evaluation PT/OT/SLP Co-Evaluation/Treatment: Yes Reason for Co-Treatment: For patient/therapist safety PT goals addressed during session: Mobility/safety with mobility OT goals addressed during session: ADL's and self-care      AM-PAC PT "6 Clicks" Mobility   Outcome Measure  Help needed turning from your back to your side while in a flat bed without using bedrails?: A Lot Help needed moving from lying on your back to sitting on the side of a flat bed without using bedrails?: A Lot Help needed moving to and from a bed to a chair (including a wheelchair)?: A Lot Help needed standing up from a chair using your arms (e.g., wheelchair or bedside chair)?: A Lot Help needed to walk in hospital room?: Total Help needed climbing 3-5 steps with a railing? : Total 6 Click Score: 10    End of Session Equipment Utilized During Treatment: Gait belt Activity Tolerance: Patient tolerated treatment well Patient left: in chair;with call bell/phone within reach;with chair alarm set Nurse Communication: Mobility status PT Visit Diagnosis: Hemiplegia and hemiparesis;Other abnormalities of gait and mobility (R26.89) Hemiplegia - Right/Left: Right Hemiplegia - dominant/non-dominant: Dominant Hemiplegia - caused by: Cerebral infarction     Time: 2119-4174 PT Time Calculation (min) (ACUTE ONLY): 21 min  Charges:  $Therapeutic Activity: 8-22 mins                      Eladia Frame B. Migdalia Dk PT, DPT Acute Rehabilitation Services Pager [phone] Office [phone]    [place] 12/07/2020, 4:48 PM

## 2020-12-07 NOTE — TOC Progression Note (Signed)
Transition of Care [facility]) - Progression Note    Patient Details  Name: NILAH BELCOURT MRN: [id number] Date of Birth: [birth year]  Transition of Care [facility]) CM/SW [place], LCSW Phone Number: 12/07/2020, 11:44 AM  Clinical Narrative:    CSW spoke with son outside patient's room. He is in agreement with discharge to [facility] on Monday. [place] aware; CSW will arrange PTAR for transport.    Expected Discharge Plan: [place] Barriers to Discharge: Continued Medical Work up,SNF Pending bed offer,SNF Covid  Expected Discharge Plan and Services Expected Discharge Plan: [place] In-house Referral: Clinical Social Work   Post Acute Care Choice: [place] Living arrangements for the past 2 months: Single Family Home                                       Social Determinants of Health (SDOH) Interventions    Readmission Risk Interventions No flowsheet data found.

## 2020-12-07 NOTE — Progress Notes (Signed)
ANTICOAGULATION CONSULT NOTE - Follow Up Consult  Pharmacy Consult for Heparin > apixaban Indication: RLE DVT   Patient Measurements: Height: 5\' 4"  (162.6 cm) Weight: 68.3 kg (150 lb 9.2 oz) IBW/kg (Calculated) : 54.7 Heparin Dosing Weight: 68.8 kg  Vital Signs: Temp: 98.7 F (37.1 C) (03/11 1200) Temp Source: Oral (03/11 1200) BP: 126/73 (03/11 1200) Pulse Rate: 81 (03/11 1200)  Labs: Recent Labs    12/05/20 0058 12/06/20 0232 12/07/20 0642  HGB  --  13.2 12.9  HCT  --  41.3 42.4  PLT  --  408* 438*  CREATININE 0.63 0.90 0.76    Estimated Creatinine Clearance: 63 mL/min (by C-G formula based on SCr of 0.76 mg/dL).   Assessment: 45 yof who presented with L M2 stroke.  Transitioned from IV heparin to apixaban on 12/02/20 for treatment of acute RLE DVT.  H/H is within normal limits, PLTC high. CT head is stable 12/02/20 and CT head on 3/9 without hemorrhage.  No active bleeding reported.  Goal of Therapy:  Monitor platelets by anticoagulation protocol: Yes   Plan:  continue apixaban 10 mg twice daily x 7 days (started 3/6 PM) followed by apixaban 5 mg twice daily on 3/13 PM.  Monitor daily heparin level and CBC, s/sx bleeding Educated patient's son about apixaban.   Thank you for allowing pharmacy to be part of this patients care team. Nicole Cella, RPh Clinical Pharmacist [phone] Please refer to [facility] for unit-specific pharmacist  12/07/2020 1:33 PM

## 2020-12-07 NOTE — Progress Notes (Signed)
Occupational Therapy Treatment Patient Details Name: Emily Lozano MRN: [id number] DOB: 07-03-1951 Today's Date: 12/07/2020    History of present illness 70 y.o. female presents with acute onset R weakness, aphasia, left gaze. CTA demonstrates L M2 occlusion. Pt admitted and underwent thrombectomy on 11/24/2020 (unsuccessful). PMH: significant for hypertension, hyperlipidemia, type 2 diabetes mellitus, depression, GERD, melanoma, sleep apnea, and recent exposure to COVID-19 with a negative test at her PCP yesterday but diagnosed with acute bronchitis.   OT comments  Pt progressing slowly towards established OT goals. Pt donning her socks at bed level with Max A for using figure four position and then initiating task. Pt requiring Max A for bed mobility and sitting at EOB with Min A for balance. Pt requiring Max A +2 for stand pivot to recliner. Pt with increased tracking to left and right visual fields. Replaced K-tape at R shoulder for reducing change of sublux; placing pillow under RUE for support. Continue to recommend dc to post-acute rehab and will continue to follow acutely as admitted.    Follow Up Recommendations  Supervision/Assistance - 24 hour;CIR    Equipment Recommendations  3 in 1 bedside commode;Wheelchair (measurements OT);Wheelchair cushion (measurements OT)    Recommendations for Other Services      Precautions / Restrictions Precautions Precautions: Fall Precaution Comments: COVID postive       Mobility Bed Mobility Overal bed mobility: Needs Assistance Bed Mobility: Supine to Sit     Supine to sit: +2 for safety/equipment;HOB elevated;Max assist     General bed mobility comments: Min A to bring RLE towards EOB. Max A for bringing hips towards EOB and elevate trunk    Transfers Overall transfer level: Needs assistance Equipment used: 2 person hand held assist Transfers: Sit to/from [facility] Sit to Stand: Max assist;+2 physical  assistance;+2 safety/equipment Stand pivot transfers: Max assist;+2 physical assistance;+2 safety/equipment       General transfer comment: Max A x 2 for stand and pivot to chair. Unable to sequence LEs, R LE unable to support weight well - knee blockinig    Balance Overall balance assessment: Needs assistance Sitting-balance support: Feet supported;Single extremity supported;No upper extremity supported Sitting balance-Leahy Scale: Poor Sitting balance - Comments: Min A for support with R lateral lean   Standing balance support: Bilateral upper extremity supported;During functional activity Standing balance-Leahy Scale: Poor Standing balance comment: Reliant on physical A and blocking of BLEs.                           ADL either performed or assessed with clinical judgement   ADL Overall ADL's : Needs assistance/impaired                     Lower Body Dressing: Maximal assistance;Bed level Lower Body Dressing Details (indicate cue type and reason): Max A to don socks in bed. Pt able to assist by lifting L LE. unable to lift R LE Toilet Transfer: Maximal assistance;+2 for physical assistance;Stand-pivot (simulated to recliner)           Functional mobility during ADLs: Maximal assistance;+2 for physical assistance (stand pivot) General ADL Comments: Pt performing stand pivot to recliner wiht Max A +2. Replaced K tap at R shoulder     Vision   Vision Assessment?: Yes Tracking/Visual Pursuits: Requires cues, head turns, or add eye shifts to track;Unable to hold eye position out of midline;Impaired - to be further tested in functional context Visual  Fields: Right visual field deficit;Impaired-to be further tested in functional context Additional Comments: Tracking to left and right. tendency for left gaze   Perception     Praxis      Cognition Arousal/Alertness: Lethargic Behavior During Therapy: Flat affect Overall Cognitive Status: Difficult to  assess Area of Impairment: Attention;Following commands;Awareness;Problem solving;Safety/judgement                   Current Attention Level: Sustained   Following Commands: Follows one step commands inconsistently;Follows one step commands with increased time Safety/Judgement: Decreased awareness of deficits Awareness: Intellectual Problem Solving: Slow processing;Requires verbal cues;Requires tactile cues General Comments: Following simple commands inconsistently. Opening eyes once EOB. At end of session, pt stating "yes i would please" when asked if she wanted a blanket        Exercises Exercises: Other exercises Other Exercises Other Exercises: Replacing K tape at R shoulder to prevent subluxation and promote muscle activation   Shoulder Instructions       General Comments VSS on RA    Pertinent Vitals/ Pain       Pain Assessment: Faces Faces Pain Scale: No hurt Pain Location: generalized Pain Descriptors / Indicators: Grimacing;Moaning Pain Intervention(s): Monitored during session;Limited activity within patient's tolerance;Repositioned  Home Living                                          Prior Functioning/Environment              Frequency  Min 2X/week        Progress Toward Goals  OT Goals(current goals can now be found in the care plan section)  Progress towards OT goals: Progressing toward goals  Acute Rehab OT Goals Patient Stated Goal: Pt unstable to state OT Goal Formulation: Patient unable to participate in goal setting Time For Goal Achievement: 12/10/20 Potential to Achieve Goals: Good ADL Goals Pt Will Perform Grooming: with mod assist;sitting Pt Will Perform Upper Body Dressing: with mod assist;sitting Pt Will Transfer to Toilet: with mod assist;ambulating;bedside commode;with +2 assist Additional ADL Goal #1: Pt will tolerate sitting at EOB for 10 minutes with Mod A in preparation for ADLs Additional ADL Goal  #2: Pt will follow one step commands during ADLs with MIn cues  Plan Discharge plan needs to be updated    Co-evaluation    PT/OT/SLP Co-Evaluation/Treatment: Yes Reason for Co-Treatment: For patient/therapist safety;To address functional/ADL transfers   OT goals addressed during session: ADL's and self-care      AM-PAC OT "6 Clicks" Daily Activity     Outcome Measure   Help from another person eating meals?: A Lot Help from another person taking care of personal grooming?: A Lot Help from another person toileting, which includes using toliet, bedpan, or urinal?: Total Help from another person bathing (including washing, rinsing, drying)?: A Lot Help from another person to put on and taking off regular upper body clothing?: A Lot Help from another person to put on and taking off regular lower body clothing?: A Lot 6 Click Score: 11    End of Session Equipment Utilized During Treatment: Gait belt  OT Visit Diagnosis: Unsteadiness on feet (R26.81);Other abnormalities of gait and mobility (R26.89);Muscle weakness (generalized) (M62.81);Pain;Hemiplegia and hemiparesis Hemiplegia - Right/Left: Right Hemiplegia - dominant/non-dominant: Dominant Hemiplegia - caused by: Cerebral infarction Pain - part of body:  (generalized discomfort)   Activity Tolerance Patient limited  by lethargy   Patient Left in chair;with call bell/phone within reach;with chair alarm set   Nurse Communication Mobility status;Other (comment) (KT tape)        Time: 0375-4360 OT Time Calculation (min): 28 min  Charges: OT General Charges $OT Visit: 1 Visit OT Treatments $Self Care/Home Management : 8-22 mins  Leslie, OTR/L Acute Rehab Pager: [phone] Office: [place] 12/07/2020, 4:01 PM

## 2020-12-08 DIAGNOSIS — I639 Cerebral infarction, unspecified: Secondary | ICD-10-CM | POA: Diagnosis not present

## 2020-12-08 LAB — GLUCOSE, CAPILLARY
Glucose-Capillary: 217 mg/dL — ABNORMAL HIGH (ref 70–99)
Glucose-Capillary: 266 mg/dL — ABNORMAL HIGH (ref 70–99)
Glucose-Capillary: 186 mg/dL — ABNORMAL HIGH (ref 70–99)

## 2020-12-08 NOTE — Progress Notes (Addendum)
PROGRESS NOTE                                                                                                                                                                                                             Patient Demographics:    Emily Lozano, is a 70 y.o. female, DOB - Mar 23, 1951, RSW:[id number]  Outpatient Primary MD for the patient is Marinda Elk, MD    LOS - 14  Admit date - 11/24/2020    Chief Complaint  Patient presents with  . Code Stroke       Brief Narrative (HPI from H&P)  - Emily Lozano is a 70 y.o. female Hx of multiple cerebrovascular risk factors including hypertension, atrial fibrillation, carotid artery disease, dyslipidemia, DM type II who presented with sudden onset of left gaze preference, right facial droop, hemiparesis and expressive aphasia still with left hemispheric dysfunction. CTA head and neck with left M2 occlusion and though outside the window for IV TPA was a candidate for intervention now s/p unsuccessful revascularization, though there was some distal movement of the clot from M2 to M3; TIKI3, also found to have COVID-19 pneumonia, was seen by critical care required intubation to protect her airway, case complicated by development of right lower extremity DVT requiring heparin drip.  She was transferred to [facility] care on 12/02/2020 on day 8 of her hospital stay.   Subjective:   Patient patient sitting in a chair, appears to be more awake, try to communicate today, she is unable to provide any reliable complaints, no significant events as discussed with staff.     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  In a patient with underlying A. fib, hypertension, dyslipidemia and DM type II. - She has been under neuro service, has had thorough stroke work-up, underwent unsuccessful  revascularization attempt by IR. -Unfortunately she is with residual  dense right-sided weakness and expressive aphasia along with some dysphagia, by PT/OT, will need placement.  . - Currently on aspirin, statin along with Eliquis for anticoagulation. - she remains with dysphagia, followed closely by SLP, she is on dysphagia 2 diet with nectar thick liquid.she is encouraged to increase her intake.    Dyslipidemia.  - LDL was 70 on rosuvastatin 10 mg.  Continue  Paroxysmal A. fib RVR.  - [place] vas 2 score of greater than 5, continue beta-blocker along  Eliquis.  Echo shows a EF of 55 to 60% no acute findings  Essential hypertension.   -Blood pressure currently controlled on Norvasc and metoprolol .  Right lower extremity DVT.   - Continue Eliquis.  History of smoking.  Counseled to quit.  COVID-19 pneumonia with acute hypoxic respiratory failure.  Required intubation and ICU stay, has completed Remdesivir course, tapering down Decadron.  She had some post intubation laryngeal edema and stridor which is much improved, currently on room air. -She is currently back on room air with no oxygen requirement. -Need 21 days of isolation.  DM type II.  On Levemir along with sliding scale, continue, dose adjusted as steroids being tapered.  Lab Results  Component Value Date   HGBA1C 10.6 (H) 11/25/2020   Lab Results  Component Value Date   CHOL 139 11/25/2020   HDL 15 (L) 11/25/2020   LDLCALC UNABLE TO CALCULATE IF TRIGLYCERIDE OVER 400 mg/dL 11/25/2020   LDLDIRECT 70.6 11/28/2020   TRIG 212 (H) 12/01/2020   CHOLHDL 9.3 11/25/2020   CBG (last 3)  Recent Labs    12/07/20 2021 12/08/20 0718 12/08/20 1212  GLUCAP 180* 217* 266*            Condition - Extremely Guarded  Family Communication : none at bedside  Code Status : Full  Consults  :  Neuro, PCCM  PUD Prophylaxis :  PPI   Procedures  :     Intubated and extubated in ICU.  Unsuccessful attempt at left M2  occlusion removal by IR      Disposition Plan  :    Status is: Inpatient  Remains inpatient appropriate because:IV treatments appropriate due to intensity of illness or inability to take PO   Dispo: The patient is from: Home              Anticipated d/c is to: SNF              Patient currently is not medically stable to d/c.  To go to [facility] on Monday.   Difficult to place patient No  DVT Prophylaxis  :  Heparin >> Eliquis  Lab Results  Component Value Date   PLT 438 (H) 12/07/2020    Diet :  Diet Order            DIET DYS 2 Room service appropriate? No; Fluid consistency: Nectar Thick  Diet effective now                  Inpatient Medications  Scheduled Meds: . amLODipine  10 mg Oral Daily  . apixaban  10 mg Oral BID   Followed by  . [START ON 12/09/2020] apixaban  5 mg Oral BID  . chlorhexidine  15 mL Mouth Rinse BID  . feeding supplement  237 mL Oral TID BM  . insulin aspart  0-15 Units Subcutaneous TID WC  . insulin detemir  12 Units Subcutaneous Daily  . mouth rinse  15 mL Mouth Rinse q12n4p  . metoprolol tartrate  50 mg Oral BID  . mometasone-formoterol  2 puff Inhalation BID  . pantoprazole  40 mg Oral QHS  . rosuvastatin  10 mg Oral Daily  . sodium chloride flush  3 mL Intravenous Once   Continuous Infusions:  PRN Meds:.acetaminophen **OR** acetaminophen (TYLENOL) oral liquid 160 mg/5 mL **OR** acetaminophen, hydrALAZINE, Resource ThickenUp Clear, senna-docusate  Antibiotics  :    Anti-infectives (From admission, onward)   Start     Dose/Rate Route Frequency Ordered Stop   11/25/20 1000  remdesivir 100 mg in sodium chloride 0.9 % 100 mL IVPB       "Followed by" Linked Group Details   100 mg 200 mL/hr over 30 Minutes Intravenous Daily 11/24/20 2119 11/28/20 1119   11/24/20 2215  remdesivir 200 mg in sodium chloride 0.9% 250 mL IVPB       "Followed by" Linked Group Details   200 mg 580 mL/hr over 30 Minutes Intravenous Once 11/24/20 2119  11/25/20 0932          M.D on 12/08/2020 at 12:37 PM  To page go to [email or web address]   [facility] -  Office  [phone]    See all Orders from today for further details    Objective:   Vitals:   12/08/20 0500 12/08/20 0520 12/08/20 0718 12/08/20 1215  BP:  114/71 113/65 117/71  Pulse:  91 95 78  Resp:  (!) 21 (!) 25 15  Temp:  98.7 F (37.1 C) 98 F (36.7 C) (!) 97.4 F (36.3 C)  TempSrc:  Axillary Oral Axillary  SpO2:  96% 94% 94%  Weight: 68.2 kg     Height:        Wt Readings from Last 3 Encounters:  12/08/20 68.2 kg  05/25/18 74.4 kg  12/10/17 76.7 kg     Intake/Output Summary (Last 24 hours) at 12/08/2020 1237 Last data filed at 12/08/2020 2426 Gross per 24 hour  Intake 120 ml  Output 450 ml  Net -330 ml     Physical Exam  She is more awake today, trying to verbalize some words and communicate, with dense right-sided hemiparesis, with expressive aphasia and neglect . Symmetrical Chest wall movement, Good air movement bilaterally, CTAB RRR,No Gallops,Rubs or new Murmurs, No Parasternal Heave +ve B.Sounds, Abd Soft, No tenderness, No rebound - guarding or rigidity. No Cyanosis, Clubbing or edema, No new Rash or bruise      Data Review:    CBC Recent Labs  Lab 12/02/20 0432 12/03/20 0118 12/04/20 0724 12/06/20 0232 12/07/20 0642  WBC 17.2* 16.4* 12.0* 8.8 9.9  HGB 10.8* 11.3* 11.4* 13.2 12.9  HCT 32.5* 36.5 36.8 41.3 42.4  PLT 412* 437* 482* 408* 438*  MCV 76.8* 78.3* 79.8* 78.8* 80.3  MCH 25.5* 24.2* 24.7* 25.2* 24.4*  MCHC 33.2 31.0 31.0 32.0 30.4  RDW 17.1* 17.0* 17.4* 18.5* 18.0*  LYMPHSABS 1.6 1.3 2.2 1.7  --   MONOABS 0.7 0.7 0.6 0.9  --   EOSABS 0.1 0.1 0.2 0.1  --   BASOSABS 0.0 0.1 0.1 0.1  --     Recent Labs  Lab 12/03/20 0118 12/04/20 0724 12/05/20 0058 12/06/20 0232 12/07/20 0642  NA 134* 136 138 136 135  K 3.6 3.5 4.0 4.3 4.1  CL 102 103 107 106 106  CO2 22 20* 11* 21* 19*  GLUCOSE 162*  120* 192* 216* 207*  BUN _0 CREATININE 0.70 0.69 0.63 0.90 0.76  CALCIUM 8.5* 8.8* 9.2 8.6* 8.7*  AST _1 --   ALT _2 --   ALKPHOS 82 87 84 67  --   BILITOT 0.9 0.7 0.7 0.6  --   ALBUMIN 2.5* 2.8* 2.9* 2.5*  --   MG 2.3 2.3 2.3 2.3  --   CRP 5.9* 1.6* 0.8 0.7  --  PROCALCITON <0.10 0.12 <0.10  --   --   BNP 386.1* 174.0* 110.0* 37.1  --     ------------------------------------------------------------------------------------------------------------------ No results for input(s): CHOL, HDL, LDLCALC, TRIG, CHOLHDL, LDLDIRECT in the last 72 hours.  Lab Results  Component Value Date   HGBA1C 10.6 (H) 11/25/2020   ------------------------------------------------------------------------------------------------------------------ No results for input(s): TSH, T4TOTAL, T3FREE, THYROIDAB in the last 72 hours.  Invalid input(s): FREET3  Cardiac Enzymes No results for input(s): CKMB, TROPONINI, MYOGLOBIN in the last 168 hours.  Invalid input(s): CK ------------------------------------------------------------------------------------------------------------------    Component Value Date/Time   BNP 37.1 12/06/2020 0232    Micro Results No results found for this or any previous visit (from the past 240 hour(s)).  Radiology Reports CT HEAD WO CONTRAST  Result Date: 12/06/2020 CLINICAL DATA:  Stroke follow-up EXAM: CT HEAD WITHOUT CONTRAST TECHNIQUE: Contiguous axial images were obtained from the base of the skull through the vertex without intravenous contrast. COMPARISON:  12/02/2020 FINDINGS: Brain: Expected evolution of left MCA territory infarct with decreased amount of cytotoxic edema. No hemorrhage. There is no midline shift or other mass effect. Severe chronic white matter disease is unchanged. No hydrocephalus. Vascular: No hyperdense vessel or unexpected calcification. Skull: Normal. Negative for fracture or focal lesion. Sinuses/Orbits: No acute  finding. Other: None. IMPRESSION: Decreased edema at site of left MCA territory infarct without hemorrhage or mass effect. Electronically Signed   By: Ulyses Jarred M.D.   On: 12/06/2020 00:28   CT HEAD WO CONTRAST  Result Date: 12/02/2020 CLINICAL DATA:  Stroke follow-up EXAM: CT HEAD WITHOUT CONTRAST TECHNIQUE: Contiguous axial images were obtained from the base of the skull through the vertex without intravenous contrast. COMPARISON:  Brain MRI 11/25/2020 FINDINGS: Brain: Large superior division left MCA territory infarct matching the area of diffusion abnormality on prior. Mild petechial hemorrhage is present and non progressed from MRI. No midline shift. Background of chronic small vessel ischemia in the cerebral white matter. No hydrocephalus or masslike finding. Vascular: Unchanged Skull: Normal. Negative for fracture or focal lesion. Sinuses/Orbits: Negative IMPRESSION: 1. Large recent left MCA territory infarct with mild petechial hemorrhage. No progression since brain MRI 11/25/2020. 2. Extensive chronic small vessel ischemia. Electronically Signed   By: Monte Fantasia M.D.   On: 12/02/2020 11:14   MR BRAIN WO CONTRAST  Result Date: 11/25/2020 CLINICAL DATA:  Acute stroke presentation 11/24/2020. Left insular infarction. EXAM: MRI HEAD WITHOUT CONTRAST TECHNIQUE: Multiplanar, multiecho pulse sequences of the brain and surrounding structures were obtained without intravenous contrast. COMPARISON:  CT and interventional studies over the last day. FINDINGS: Brain: Diffusion imaging shows acute infarction in the deep insula and left frontoparietal cortical and subcortical brain. Mild swelling in the region. There are either minimal petechial blood products or there is susceptibility artifact related to thrombosed vessels. No frank hematoma. No mass effect or shift. Elsewhere, the brainstem and cerebellum are unremarkable. Both cerebral hemispheres show moderate to severe chronic small-vessel ischemic  changes throughout the white matter. No hydrocephalus. No extra-axial collection. Vascular: Major vessels at base of the brain show flow. Skull and upper cervical spine: Negative Sinuses/Orbits: Mucosal inflammatory changes of paranasal sinuses. Orbits negative. Other: None IMPRESSION: 1. Acute infarction in the left MCA territory affecting the deep insula and left frontoparietal cortical and subcortical brain. Minimal petechial blood products or susceptibility artifact related to thrombosed vessels. No frank hematoma. No mass effect or shift. 2. Moderate to severe chronic small-vessel ischemic changes elsewhere throughout the cerebral hemispheric white matter. Electronically Signed  By: Nelson Chimes M.D.   On: 11/25/2020 02:18   IR CT Head Ltd  Result Date: 11/27/2020 INDICATION: 70 year old female with past medical history significant for rheumatoid arthritis, diabetes mellitus, hypertension, anemia and melanoma presenting with acute onset of right hemiparesis, aphasia and left gaze preference. She was found to be covered positive today. Last known well at 10:30 a.m. on 11/24/2020; modified Rankin scale 0; NIHSS 7. No IV tPA given as she was outside the window. Head CT showed an acute infarct the in the left insula and CT angiogram showed intracranial atherosclerotic disease with a left M2/MCA occlusion. EXAM: Ultrasound-guided vascular access Diagnostic cerebral angiogram Mechanical thrombectomy Flat panel head CT COMPARISON:  CT/CT angiogram of the head and neck November 24, 2020. MEDICATIONS: Refer to anesthesia documentation. ANESTHESIA/SEDATION: The procedure was performed under general anesthesia CONTRAST:  For 75 mL of Omnipaque 240 mg/mL FLUOROSCOPY TIME:  Fluoroscopy Time: 24 minutes 54 seconds (1,520 mGy). COMPLICATIONS: SIR Level A - No therapy, no consequence. TECHNIQUE: Informed written consent was obtained from the patient's son after a thorough discussion of the procedural risks, benefits and  alternatives. All questions were addressed. Maximal Sterile Barrier Technique was utilized including caps, mask, sterile gowns, sterile gloves, sterile drape, hand hygiene and skin antiseptic. A timeout was performed prior to the initiation of the procedure. The right groin was prepped and draped in the usual sterile fashion. Using a micropuncture kit and the modified Seldinger technique, access was gained to the right common femoral artery and an 8 French sheath was placed. Real-time ultrasound guidance was utilized for vascular access including the acquisition of a permanent ultrasound image documenting patency of the accessed vessel. Under fluoroscopy, a Zoom 88 guide catheter was navigated over a 6 [place] Berenstein 2 catheter and a 0.035" Terumo Glidewire into the aortic arch. The catheter was placed into the left common carotid artery and then advanced into the left internal carotid artery. The inner catheter was removed. Frontal and lateral angiograms of the head were obtained. FINDINGS: 1. Occlusion of the mid left M2/MCA middle division branch. 2. Intracranial atherosclerotic disease with multiple luminal irregularities along the left MCA and left ACA vascular tree more significant at the A3 segment where there is moderate stenosis. 3. Patent right common femoral artery. PROCEDURE: Under biplane roadmap, a zoom 55 aspiration catheter was navigated over an Aristotle 18 microguidewire into the cavernous segment of the left ICA and then advanced to the level of occlusion. The aspiration catheter was connected to a penumbra aspiration pump. The guiding catheter balloon was inflated. The aspiration catheter were removed under constant aspiration. Follow-up left ICA angiograms showed persistent occlusion which appear to have progressed more distally. Under biplane roadmap, a zoom 55 aspiration catheter was navigated over an Aristotle 18 microguidewire into the cavernous segment of the left ICA and then advanced  to the level of occlusion. The aspiration catheter was connected to a penumbra aspiration pump. The guiding catheter balloon was inflated. The aspiration catheter were removed under constant aspiration. Follow-up left ICA angiograms showed no significant change. Under biplane roadmap, a zoom 55 aspiration catheter was navigated over a phenom 21 microcatheter and a Aristotle 18 microguidewire into the cavernous segment of the right ICA. The microcatheter was then navigated over the wire into the left M3/MCA middle division branch. Then, a 4 x 40 mm solitaire stent retriever was deployed spanning the left M2 and proximal M3 segment. The device was allowed to intercalated with the clot for 4 minutes. The microcatheter  was removed. The aspiration catheter was advanced to the level of occlusion and connected to a penumbra aspiration pump. The thrombectomy device and aspiration catheter were removed under constant aspiration. Follow-up left ICA angiogram showed significant change. Under biplane roadmap, a zoom 55 aspiration catheter was navigated over a phenom 21 microcatheter and a Aristotle 18 microguidewire into the cavernous segment of the right ICA. The microcatheter was then navigated over the wire into the left M3/MCA middle division branch. Then, a 4 x 40 mm solitaire stent retriever was deployed spanning the left M2 and proximal M3 segment. The device was allowed to intercalated with the clot for 4 minutes. The microcatheter was removed. The aspiration catheter was advanced to the level of occlusion and connected to a penumbra aspiration pump. The thrombectomy device and aspiration catheter were removed under constant aspiration. Follow-up angiogram showed persistent occlusion. Flat panel CT of the head was obtained and post processed in a separate workstation with concurrent attending physician supervision. Selected images were sent to PACS. Small contrast extravasation in the left sylvian fissure noted.  Follow-up angiogram showed no evidence of active contrast extravasation with persistent occlusion of a left M2 branch. Delay flat panel CT of the head was obtained and post processed in a separate workstation with concurrent attending physician supervision. Selected images were sent to PACS. Left sylvian contrast extravasation appears stable. Delayed left ICA angiograms confirmed no evidence of contrast extravasation. The catheter was subsequently withdrawn. Left common femoral artery angiograms were obtained in frontal and lateral views. The puncture is at the level of the common femoral artery which has adequate caliber for closure device. The sheath was exchanged for a Perclose ProGlide which was utilized for access closure. Immediate hemostasis was achieved. IMPRESSION: Unsuccessful attempted recanalization of a mid left M2/MCA middle division branch complicated by small amount contrast extravasation in the left sylvian fissure. No active extravasation seen at the end of the procedure. Left MCA status remains TICI2b, which is similar to presentation. PLAN: Patient is to remain intubated given unsuccessful recanalization and COVID positive test. She will be transferred to ICU for continued care. Electronically Signed   By: Pedro Earls M.D.   On: 11/27/2020 09:25   IR CT Head Ltd  Result Date: 11/27/2020 INDICATION: 70 year old female with past medical history significant for rheumatoid arthritis, diabetes mellitus, hypertension, anemia and melanoma presenting with acute onset of right hemiparesis, aphasia and left gaze preference. She was found to be covered positive today. Last known well at 10:30 a.m. on 11/24/2020; modified Rankin scale 0; NIHSS 7. No IV tPA given as she was outside the window. Head CT showed an acute infarct the in the left insula and CT angiogram showed intracranial atherosclerotic disease with a left M2/MCA occlusion. EXAM: Ultrasound-guided vascular access Diagnostic  cerebral angiogram Mechanical thrombectomy Flat panel head CT COMPARISON:  CT/CT angiogram of the head and neck November 24, 2020. MEDICATIONS: Refer to anesthesia documentation. ANESTHESIA/SEDATION: The procedure was performed under general anesthesia CONTRAST:  For 75 mL of Omnipaque 240 mg/mL FLUOROSCOPY TIME:  Fluoroscopy Time: 24 minutes 54 seconds (1,520 mGy). COMPLICATIONS: SIR Level A - No therapy, no consequence. TECHNIQUE: Informed written consent was obtained from the patient's son after a thorough discussion of the procedural risks, benefits and alternatives. All questions were addressed. Maximal Sterile Barrier Technique was utilized including caps, mask, sterile gowns, sterile gloves, sterile drape, hand hygiene and skin antiseptic. A timeout was performed prior to the initiation of the procedure. The right groin was  prepped and draped in the usual sterile fashion. Using a micropuncture kit and the modified Seldinger technique, access was gained to the right common femoral artery and an 8 French sheath was placed. Real-time ultrasound guidance was utilized for vascular access including the acquisition of a permanent ultrasound image documenting patency of the accessed vessel. Under fluoroscopy, a Zoom 88 guide catheter was navigated over a 6 [place] Berenstein 2 catheter and a 0.035" Terumo Glidewire into the aortic arch. The catheter was placed into the left common carotid artery and then advanced into the left internal carotid artery. The inner catheter was removed. Frontal and lateral angiograms of the head were obtained. FINDINGS: 1. Occlusion of the mid left M2/MCA middle division branch. 2. Intracranial atherosclerotic disease with multiple luminal irregularities along the left MCA and left ACA vascular tree more significant at the A3 segment where there is moderate stenosis. 3. Patent right common femoral artery. PROCEDURE: Under biplane roadmap, a zoom 55 aspiration catheter was navigated over  an Aristotle 18 microguidewire into the cavernous segment of the left ICA and then advanced to the level of occlusion. The aspiration catheter was connected to a penumbra aspiration pump. The guiding catheter balloon was inflated. The aspiration catheter were removed under constant aspiration. Follow-up left ICA angiograms showed persistent occlusion which appear to have progressed more distally. Under biplane roadmap, a zoom 55 aspiration catheter was navigated over an Aristotle 18 microguidewire into the cavernous segment of the left ICA and then advanced to the level of occlusion. The aspiration catheter was connected to a penumbra aspiration pump. The guiding catheter balloon was inflated. The aspiration catheter were removed under constant aspiration. Follow-up left ICA angiograms showed no significant change. Under biplane roadmap, a zoom 55 aspiration catheter was navigated over a phenom 21 microcatheter and a Aristotle 18 microguidewire into the cavernous segment of the right ICA. The microcatheter was then navigated over the wire into the left M3/MCA middle division branch. Then, a 4 x 40 mm solitaire stent retriever was deployed spanning the left M2 and proximal M3 segment. The device was allowed to intercalated with the clot for 4 minutes. The microcatheter was removed. The aspiration catheter was advanced to the level of occlusion and connected to a penumbra aspiration pump. The thrombectomy device and aspiration catheter were removed under constant aspiration. Follow-up left ICA angiogram showed significant change. Under biplane roadmap, a zoom 55 aspiration catheter was navigated over a phenom 21 microcatheter and a Aristotle 18 microguidewire into the cavernous segment of the right ICA. The microcatheter was then navigated over the wire into the left M3/MCA middle division branch. Then, a 4 x 40 mm solitaire stent retriever was deployed spanning the left M2 and proximal M3 segment. The device was  allowed to intercalated with the clot for 4 minutes. The microcatheter was removed. The aspiration catheter was advanced to the level of occlusion and connected to a penumbra aspiration pump. The thrombectomy device and aspiration catheter were removed under constant aspiration. Follow-up angiogram showed persistent occlusion. Flat panel CT of the head was obtained and post processed in a separate workstation with concurrent attending physician supervision. Selected images were sent to PACS. Small contrast extravasation in the left sylvian fissure noted. Follow-up angiogram showed no evidence of active contrast extravasation with persistent occlusion of a left M2 branch. Delay flat panel CT of the head was obtained and post processed in a separate workstation with concurrent attending physician supervision. Selected images were sent to PACS. Left sylvian contrast extravasation  appears stable. Delayed left ICA angiograms confirmed no evidence of contrast extravasation. The catheter was subsequently withdrawn. Left common femoral artery angiograms were obtained in frontal and lateral views. The puncture is at the level of the common femoral artery which has adequate caliber for closure device. The sheath was exchanged for a Perclose ProGlide which was utilized for access closure. Immediate hemostasis was achieved. IMPRESSION: Unsuccessful attempted recanalization of a mid left M2/MCA middle division branch complicated by small amount contrast extravasation in the left sylvian fissure. No active extravasation seen at the end of the procedure. Left MCA status remains TICI2b, which is similar to presentation. PLAN: Patient is to remain intubated given unsuccessful recanalization and COVID positive test. She will be transferred to ICU for continued care. Electronically Signed   By: Pedro Earls M.D.   On: 11/27/2020 09:25   IR US Guide Vasc Access Right  Result Date: 11/27/2020 INDICATION: 70 year old  female with past medical history significant for rheumatoid arthritis, diabetes mellitus, hypertension, anemia and melanoma presenting with acute onset of right hemiparesis, aphasia and left gaze preference. She was found to be covered positive today. Last known well at 10:30 a.m. on 11/24/2020; modified Rankin scale 0; NIHSS 7. No IV tPA given as she was outside the window. Head CT showed an acute infarct the in the left insula and CT angiogram showed intracranial atherosclerotic disease with a left M2/MCA occlusion. EXAM: Ultrasound-guided vascular access Diagnostic cerebral angiogram Mechanical thrombectomy Flat panel head CT COMPARISON:  CT/CT angiogram of the head and neck November 24, 2020. MEDICATIONS: Refer to anesthesia documentation. ANESTHESIA/SEDATION: The procedure was performed under general anesthesia CONTRAST:  For 75 mL of Omnipaque 240 mg/mL FLUOROSCOPY TIME:  Fluoroscopy Time: 24 minutes 54 seconds (1,520 mGy). COMPLICATIONS: SIR Level A - No therapy, no consequence. TECHNIQUE: Informed written consent was obtained from the patient's son after a thorough discussion of the procedural risks, benefits and alternatives. All questions were addressed. Maximal Sterile Barrier Technique was utilized including caps, mask, sterile gowns, sterile gloves, sterile drape, hand hygiene and skin antiseptic. A timeout was performed prior to the initiation of the procedure. The right groin was prepped and draped in the usual sterile fashion. Using a micropuncture kit and the modified Seldinger technique, access was gained to the right common femoral artery and an 8 French sheath was placed. Real-time ultrasound guidance was utilized for vascular access including the acquisition of a permanent ultrasound image documenting patency of the accessed vessel. Under fluoroscopy, a Zoom 88 guide catheter was navigated over a 6 [place] Berenstein 2 catheter and a 0.035" Terumo Glidewire into the aortic arch. The catheter was  placed into the left common carotid artery and then advanced into the left internal carotid artery. The inner catheter was removed. Frontal and lateral angiograms of the head were obtained. FINDINGS: 1. Occlusion of the mid left M2/MCA middle division branch. 2. Intracranial atherosclerotic disease with multiple luminal irregularities along the left MCA and left ACA vascular tree more significant at the A3 segment where there is moderate stenosis. 3. Patent right common femoral artery. PROCEDURE: Under biplane roadmap, a zoom 55 aspiration catheter was navigated over an Aristotle 18 microguidewire into the cavernous segment of the left ICA and then advanced to the level of occlusion. The aspiration catheter was connected to a penumbra aspiration pump. The guiding catheter balloon was inflated. The aspiration catheter were removed under constant aspiration. Follow-up left ICA angiograms showed persistent occlusion which appear to have progressed more distally. Under  biplane roadmap, a zoom 55 aspiration catheter was navigated over an Aristotle 18 microguidewire into the cavernous segment of the left ICA and then advanced to the level of occlusion. The aspiration catheter was connected to a penumbra aspiration pump. The guiding catheter balloon was inflated. The aspiration catheter were removed under constant aspiration. Follow-up left ICA angiograms showed no significant change. Under biplane roadmap, a zoom 55 aspiration catheter was navigated over a phenom 21 microcatheter and a Aristotle 18 microguidewire into the cavernous segment of the right ICA. The microcatheter was then navigated over the wire into the left M3/MCA middle division branch. Then, a 4 x 40 mm solitaire stent retriever was deployed spanning the left M2 and proximal M3 segment. The device was allowed to intercalated with the clot for 4 minutes. The microcatheter was removed. The aspiration catheter was advanced to the level of occlusion and  connected to a penumbra aspiration pump. The thrombectomy device and aspiration catheter were removed under constant aspiration. Follow-up left ICA angiogram showed significant change. Under biplane roadmap, a zoom 55 aspiration catheter was navigated over a phenom 21 microcatheter and a Aristotle 18 microguidewire into the cavernous segment of the right ICA. The microcatheter was then navigated over the wire into the left M3/MCA middle division branch. Then, a 4 x 40 mm solitaire stent retriever was deployed spanning the left M2 and proximal M3 segment. The device was allowed to intercalated with the clot for 4 minutes. The microcatheter was removed. The aspiration catheter was advanced to the level of occlusion and connected to a penumbra aspiration pump. The thrombectomy device and aspiration catheter were removed under constant aspiration. Follow-up angiogram showed persistent occlusion. Flat panel CT of the head was obtained and post processed in a separate workstation with concurrent attending physician supervision. Selected images were sent to PACS. Small contrast extravasation in the left sylvian fissure noted. Follow-up angiogram showed no evidence of active contrast extravasation with persistent occlusion of a left M2 branch. Delay flat panel CT of the head was obtained and post processed in a separate workstation with concurrent attending physician supervision. Selected images were sent to PACS. Left sylvian contrast extravasation appears stable. Delayed left ICA angiograms confirmed no evidence of contrast extravasation. The catheter was subsequently withdrawn. Left common femoral artery angiograms were obtained in frontal and lateral views. The puncture is at the level of the common femoral artery which has adequate caliber for closure device. The sheath was exchanged for a Perclose ProGlide which was utilized for access closure. Immediate hemostasis was achieved. IMPRESSION: Unsuccessful attempted  recanalization of a mid left M2/MCA middle division branch complicated by small amount contrast extravasation in the left sylvian fissure. No active extravasation seen at the end of the procedure. Left MCA status remains TICI2b, which is similar to presentation. PLAN: Patient is to remain intubated given unsuccessful recanalization and COVID positive test. She will be transferred to ICU for continued care. Electronically Signed   By: Pedro Earls M.D.   On: 11/27/2020 09:25   CT CEREBRAL PERFUSION W CONTRAST  Result Date: 11/24/2020 CLINICAL DATA:  Neuro deficit, acute, stroke suspected EXAM: CT PERFUSION BRAIN TECHNIQUE: Multiphase CT imaging of the brain was performed following IV bolus contrast injection. Subsequent parametric perfusion maps were calculated using RAPID software. CONTRAST:  82m OMNIPAQUE IOHEXOL 350 MG/ML SOLN COMPARISON:  Concurrent CTA head and neck. FINDINGS: CT Brain Perfusion Findings: CBF (<30%) Volume: 017mPerfusion (Tmax>6.0s) volume: 3051mismatch Volume: 66m27mPECTS on noncontrast CT Head: 9  at 16:42 today. Infarct Core: 0 mL Infarction Location:Not applicable. IMPRESSION: Perfusion imaging demonstrates no infarct core. 30 mL region of ischemia involving the left MCA territory. Electronically Signed   By: Primitivo Gauze M.D.   On: 11/24/2020 16:55   DG Chest Port 1 View  Result Date: 12/03/2020 CLINICAL DATA:  Shortness of breath.  COVID-19 positive EXAM: PORTABLE CHEST 1 VIEW COMPARISON:  December 02, 2020 FINDINGS: Patchy airspace opacity is noted in the lung bases, more on the right than on the left. Lungs elsewhere clear. Heart is upper normal in size with pulmonary vascularity normal. No adenopathy. There is aortic atherosclerosis. There is calcification in each carotid artery. No bone lesions. IMPRESSION: Persistent airspace opacity bases, more on the right than the left, likely due to atypical organism pneumonia. No new opacity evident. Stable cardiac  silhouette. Aortic Atherosclerosis (ICD10-I70.0). There are foci of carotid artery calcification bilaterally. Electronically Signed   By: Lowella Grip III M.D.   On: 12/03/2020 08:41   DG Chest Port 1 View  Result Date: 12/02/2020 CLINICAL DATA:  Shortness of breath EXAM: PORTABLE CHEST 1 VIEW COMPARISON:  November 24, 2020 FINDINGS: The cardiomediastinal silhouette is unchanged in contour.Interval extubation. Atherosclerotic calcifications of the aorta. No pleural effusion. No pneumothorax. Patchy RIGHT basilar peripheral opacities, similar in comparison to prior. Mildly improved aeration of the LEFT lung base in comparison to prior. Visualized abdomen is unremarkable. Multilevel degenerative changes of the thoracic spine. IMPRESSION: 1. Patchy RIGHT basilar peripheral opacities, similar in comparison to prior and consistent with the sequela of COVID-19 infection. 2. Mildly improved aeration of the LEFT lung base in comparison to prior. Electronically Signed   By: Valentino Saxon MD   On: 12/02/2020 11:26   DG Chest Port 1 View  Result Date: 11/24/2020 CLINICAL DATA:  COVID positive EXAM: PORTABLE CHEST 1 VIEW COMPARISON:  None. FINDINGS: The heart size and mediastinal contours are mildly enlarged. ETT is 4 cm above the carina. NG tube is seen below the diaphragm. Aortic knob calcifications are seen. There is hazy patchy airspace opacity seen at the periphery of the right lower lung and at the left lung base. No pleural effusion. IMPRESSION: ETT and NG tube in satisfactory position Bilateral multifocal airspace opacities, consistent with multifocal pneumonia Electronically Signed   By: Prudencio Pair M.D.   On: 11/24/2020 22:14   IR PERCUTANEOUS ART THROMBECTOMY/INFUSION INTRACRANIAL INC DIAG ANGIO  Result Date: 11/27/2020 INDICATION: 70 year old female with past medical history significant for rheumatoid arthritis, diabetes mellitus, hypertension, anemia and melanoma presenting with acute onset of  right hemiparesis, aphasia and left gaze preference. She was found to be covered positive today. Last known well at 10:30 a.m. on 11/24/2020; modified Rankin scale 0; NIHSS 7. No IV tPA given as she was outside the window. Head CT showed an acute infarct the in the left insula and CT angiogram showed intracranial atherosclerotic disease with a left M2/MCA occlusion. EXAM: Ultrasound-guided vascular access Diagnostic cerebral angiogram Mechanical thrombectomy Flat panel head CT COMPARISON:  CT/CT angiogram of the head and neck November 24, 2020. MEDICATIONS: Refer to anesthesia documentation. ANESTHESIA/SEDATION: The procedure was performed under general anesthesia CONTRAST:  For 75 mL of Omnipaque 240 mg/mL FLUOROSCOPY TIME:  Fluoroscopy Time: 24 minutes 54 seconds (1,520 mGy). COMPLICATIONS: SIR Level A - No therapy, no consequence. TECHNIQUE: Informed written consent was obtained from the patient's son after a thorough discussion of the procedural risks, benefits and alternatives. All questions were addressed. Maximal Sterile Barrier Technique was utilized  including caps, mask, sterile gowns, sterile gloves, sterile drape, hand hygiene and skin antiseptic. A timeout was performed prior to the initiation of the procedure. The right groin was prepped and draped in the usual sterile fashion. Using a micropuncture kit and the modified Seldinger technique, access was gained to the right common femoral artery and an 8 French sheath was placed. Real-time ultrasound guidance was utilized for vascular access including the acquisition of a permanent ultrasound image documenting patency of the accessed vessel. Under fluoroscopy, a Zoom 88 guide catheter was navigated over a 6 [place] Berenstein 2 catheter and a 0.035" Terumo Glidewire into the aortic arch. The catheter was placed into the left common carotid artery and then advanced into the left internal carotid artery. The inner catheter was removed. Frontal and lateral  angiograms of the head were obtained. FINDINGS: 1. Occlusion of the mid left M2/MCA middle division branch. 2. Intracranial atherosclerotic disease with multiple luminal irregularities along the left MCA and left ACA vascular tree more significant at the A3 segment where there is moderate stenosis. 3. Patent right common femoral artery. PROCEDURE: Under biplane roadmap, a zoom 55 aspiration catheter was navigated over an Aristotle 18 microguidewire into the cavernous segment of the left ICA and then advanced to the level of occlusion. The aspiration catheter was connected to a penumbra aspiration pump. The guiding catheter balloon was inflated. The aspiration catheter were removed under constant aspiration. Follow-up left ICA angiograms showed persistent occlusion which appear to have progressed more distally. Under biplane roadmap, a zoom 55 aspiration catheter was navigated over an Aristotle 18 microguidewire into the cavernous segment of the left ICA and then advanced to the level of occlusion. The aspiration catheter was connected to a penumbra aspiration pump. The guiding catheter balloon was inflated. The aspiration catheter were removed under constant aspiration. Follow-up left ICA angiograms showed no significant change. Under biplane roadmap, a zoom 55 aspiration catheter was navigated over a phenom 21 microcatheter and a Aristotle 18 microguidewire into the cavernous segment of the right ICA. The microcatheter was then navigated over the wire into the left M3/MCA middle division branch. Then, a 4 x 40 mm solitaire stent retriever was deployed spanning the left M2 and proximal M3 segment. The device was allowed to intercalated with the clot for 4 minutes. The microcatheter was removed. The aspiration catheter was advanced to the level of occlusion and connected to a penumbra aspiration pump. The thrombectomy device and aspiration catheter were removed under constant aspiration. Follow-up left ICA angiogram  showed significant change. Under biplane roadmap, a zoom 55 aspiration catheter was navigated over a phenom 21 microcatheter and a Aristotle 18 microguidewire into the cavernous segment of the right ICA. The microcatheter was then navigated over the wire into the left M3/MCA middle division branch. Then, a 4 x 40 mm solitaire stent retriever was deployed spanning the left M2 and proximal M3 segment. The device was allowed to intercalated with the clot for 4 minutes. The microcatheter was removed. The aspiration catheter was advanced to the level of occlusion and connected to a penumbra aspiration pump. The thrombectomy device and aspiration catheter were removed under constant aspiration. Follow-up angiogram showed persistent occlusion. Flat panel CT of the head was obtained and post processed in a separate workstation with concurrent attending physician supervision. Selected images were sent to PACS. Small contrast extravasation in the left sylvian fissure noted. Follow-up angiogram showed no evidence of active contrast extravasation with persistent occlusion of a left M2 branch. Delay flat  panel CT of the head was obtained and post processed in a separate workstation with concurrent attending physician supervision. Selected images were sent to PACS. Left sylvian contrast extravasation appears stable. Delayed left ICA angiograms confirmed no evidence of contrast extravasation. The catheter was subsequently withdrawn. Left common femoral artery angiograms were obtained in frontal and lateral views. The puncture is at the level of the common femoral artery which has adequate caliber for closure device. The sheath was exchanged for a Perclose ProGlide which was utilized for access closure. Immediate hemostasis was achieved. IMPRESSION: Unsuccessful attempted recanalization of a mid left M2/MCA middle division branch complicated by small amount contrast extravasation in the left sylvian fissure. No active extravasation  seen at the end of the procedure. Left MCA status remains TICI2b, which is similar to presentation. PLAN: Patient is to remain intubated given unsuccessful recanalization and COVID positive test. She will be transferred to ICU for continued care. Electronically Signed   By: Pedro Earls M.D.   On: 11/27/2020 09:25   CT HEAD CODE STROKE WO CONTRAST  Result Date: 11/24/2020 CLINICAL DATA:  Code stroke.  Neuro deficit, acute, stroke suspected EXAM: CT HEAD WITHOUT CONTRAST TECHNIQUE: Contiguous axial images were obtained from the base of the skull through the vertex without intravenous contrast. COMPARISON:  06/27/2020. FINDINGS: Brain: No intracranial hemorrhage. New focal hypodensity involving the left insula. No mass lesion. No midline shift, ventriculomegaly or extra-axial fluid collection. Chronic microvascular ischemic changes. Vascular: No hyperdense vessel or unexpected calcification. Minimal carotid siphon atherosclerotic calcifications. Skull: Negative for fracture or focal lesion. Sinuses/Orbits: No acute finding.  Mild pansinus mucosal thickening. Other: Right parietal scalp nodule is unchanged. ASPECTS [facility] Stroke Program Early CT Score) - Ganglionic level infarction (caudate, lentiform nuclei, internal capsule, insula, M1-M3 cortex): 6 - Supraganglionic infarction (M4-M6 cortex): 3 Total score (0-10 with 10 being normal): 9 IMPRESSION: 1. 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 2. Chronic microvascular ischemic changes. 3. ASPECTS is 9 Code stroke imaging results were communicated on 11/24/2020 at 4:23 pm to provider Dr. Rory Percy via secure text paging. Electronically Signed   By: Primitivo Gauze M.D.   On: 11/24/2020 16:24   VAS [place] LOWER EXTREMITY VENOUS (DVT)  Result Date: 11/26/2020  Lower Venous DVT Study Indications: Stroke, and Covid-19.  Comparison Study: No prior study on file Performing Technologist: Sharion Dove RVS  Examination Guidelines: A complete  evaluation includes B-mode imaging, spectral Doppler, color Doppler, and power Doppler as needed of all accessible portions of each vessel. Bilateral testing is considered an integral part of a complete examination. Limited examinations for reoccurring indications may be performed as noted. The reflux portion of the exam is performed with the patient in reverse Trendelenburg.  +---------+---------------+---------+-----------+----------+--------------+ RIGHT    CompressibilityPhasicitySpontaneityPropertiesThrombus Aging +---------+---------------+---------+-----------+----------+--------------+ CFV      Full           Yes      Yes                                 +---------+---------------+---------+-----------+----------+--------------+ SFJ      Full                                                        +---------+---------------+---------+-----------+----------+--------------+ FV Prox  Full                                                        +---------+---------------+---------+-----------+----------+--------------+  FV Mid   Full                                                        +---------+---------------+---------+-----------+----------+--------------+ FV DistalFull                                                        +---------+---------------+---------+-----------+----------+--------------+ PFV      Full                                                        +---------+---------------+---------+-----------+----------+--------------+ POP      Full           Yes      Yes                                 +---------+---------------+---------+-----------+----------+--------------+ PTV      Full                                                        +---------+---------------+---------+-----------+----------+--------------+ PERO     None                                         Acute           +---------+---------------+---------+-----------+----------+--------------+   +---------+---------------+---------+-----------+----------+--------------+ LEFT     CompressibilityPhasicitySpontaneityPropertiesThrombus Aging +---------+---------------+---------+-----------+----------+--------------+ CFV      Full           Yes      Yes                                 +---------+---------------+---------+-----------+----------+--------------+ SFJ      Full                                                        +---------+---------------+---------+-----------+----------+--------------+ FV Prox  Full                                                        +---------+---------------+---------+-----------+----------+--------------+ FV Mid   Full                                                        +---------+---------------+---------+-----------+----------+--------------+  FV DistalFull                                                        +---------+---------------+---------+-----------+----------+--------------+ PFV      Full                                                        +---------+---------------+---------+-----------+----------+--------------+ POP      Full           Yes      Yes                                 +---------+---------------+---------+-----------+----------+--------------+ PTV      Full                                                        +---------+---------------+---------+-----------+----------+--------------+ PERO     Full                                                        +---------+---------------+---------+-----------+----------+--------------+     Summary: RIGHT: - Findings consistent with acute deep vein thrombosis involving the right peroneal veins.  LEFT: - There is no evidence of deep vein thrombosis in the lower extremity.  *See table(s) above for measurements and observations. Electronically signed by  Servando Snare MD on 11/26/2020 at 5:59:41 PM.    Final    ECHOCARDIOGRAM LIMITED  Result Date: 11/25/2020    ECHOCARDIOGRAM REPORT   Patient Name:   LYLY CANIZALES [facility] Date of Exam: 11/25/2020 Medical Rec #:  [id number]        Height:       64.0 in Accession #:    [id number]       Weight:       176.4 lb Date of Birth:  [birth year]        BSA:          1.855 m Patient Age:    8 years         BP:           119/66 mmHg Patient Gender: F                HR:           77 bpm. Exam Location:  Inpatient Procedure: Limited Echo, Cardiac Doppler and Color Doppler Indications:    Stroke I63.9  History:        Patient has no prior history of Echocardiogram examinations.                 Risk Factors:Hypertension, Diabetes and Former Smoker.  Sonographer:    Vickie Epley RDCS Referring Phys: [id number] ASHISH ARORA  Sonographer Comments: Echo performed with patient supine and on artificial respirator. Covid positive. IMPRESSIONS  1.  Left ventricular ejection fraction, by estimation, is 55 to 60%. The left ventricle has normal function. The left ventricle has no regional wall motion abnormalities. Left ventricular diastolic parameters are consistent with Grade I diastolic dysfunction (impaired relaxation).  2. Right ventricular systolic function is normal. The right ventricular size is normal.  3. The mitral valve is normal in structure. Trivial mitral valve regurgitation. No evidence of mitral stenosis.  4. The aortic valve is tricuspid. Aortic valve regurgitation is trivial. Mild aortic valve sclerosis is present, with no evidence of aortic valve stenosis.  5. The inferior vena cava is normal in size with greater than 50% respiratory variability, suggesting right atrial pressure of 3 mmHg. FINDINGS  Left Ventricle: Left ventricular ejection fraction, by estimation, is 55 to 60%. The left ventricle has normal function. The left ventricle has no regional wall motion abnormalities. The left ventricular internal cavity size was normal in  size. There is  no left ventricular hypertrophy. Left ventricular diastolic parameters are consistent with Grade I diastolic dysfunction (impaired relaxation). Right Ventricle: The right ventricular size is normal.Right ventricular systolic function is normal. Left Atrium: Left atrial size was normal in size. Right Atrium: Right atrial size was normal in size. Pericardium: There is no evidence of pericardial effusion. Mitral Valve: The mitral valve is normal in structure. Trivial mitral valve regurgitation. No evidence of mitral valve stenosis. Tricuspid Valve: The tricuspid valve is normal in structure. Tricuspid valve regurgitation is trivial. No evidence of tricuspid stenosis. Aortic Valve: The aortic valve is tricuspid. Aortic valve regurgitation is trivial. Mild aortic valve sclerosis is present, with no evidence of aortic valve stenosis. Pulmonic Valve: The pulmonic valve was not well visualized. Pulmonic valve regurgitation is trivial. No evidence of pulmonic stenosis. Aorta: The aortic root is normal in size and structure. Venous: The inferior vena cava is normal in size with greater than 50% respiratory variability, suggesting right atrial pressure of 3 mmHg.  LEFT VENTRICLE PLAX 2D LVIDd:         4.10 cm     Diastology LVIDs:         3.20 cm     LV e' medial:    6.09 cm/s LV PW:         1.00 cm     LV E/e' medial:  11.1 LV IVS:        0.90 cm     LV e' lateral:   9.03 cm/s LVOT diam:     2.00 cm     LV E/e' lateral: 7.5 LV SV:         42 LV SV Index:   23 LVOT Area:     3.14 cm  LV Volumes (MOD) LV vol d, MOD A2C: 88.5 ml LV vol d, MOD A4C: 80.0 ml LV vol s, MOD A2C: 46.6 ml LV vol s, MOD A4C: 41.6 ml LV SV MOD A2C:     41.9 ml LV SV MOD A4C:     80.0 ml LV SV MOD BP:      40.6 ml RIGHT VENTRICLE RV S prime:     13.80 cm/s TAPSE (M-mode): 1.5 cm LEFT ATRIUM         Index LA diam:    2.70 cm 1.46 cm/m  AORTIC VALVE LVOT Vmax:   71.80 cm/s LVOT Vmean:  56.700 cm/s LVOT VTI:    0.134 m  AORTA Ao Root diam:  3.70 cm MITRAL VALVE MV Area (PHT): 3.85 cm    SHUNTS MV Decel Time:  197 msec    Systemic VTI:  0.13 m MV E velocity: 67.70 cm/s  Systemic Diam: 2.00 cm MV A velocity: 81.00 cm/s MV E/A ratio:  0.84 Kirk Ruths MD Electronically signed by Kirk Ruths MD Signature Date/Time: 11/25/2020/10:54:57 AM    Final    CT ANGIO HEAD CODE STROKE  Result Date: 11/24/2020 CLINICAL DATA:  Neuro deficit, acute, stroke suspected EXAM: CT ANGIOGRAPHY HEAD AND NECK TECHNIQUE: Multidetector CT imaging of the head and neck was performed using the standard protocol during bolus administration of intravenous contrast. Multiplanar CT image reconstructions and MIPs were obtained to evaluate the vascular anatomy. Carotid stenosis measurements (when applicable) are obtained utilizing NASCET criteria, using the distal internal carotid diameter as the denominator. CONTRAST:  23m OMNIPAQUE IOHEXOL 350 MG/ML SOLN COMPARISON:  None. FINDINGS: CTA NECK FINDINGS Aortic arch: Standard branching. Mild aortic arch atherosclerotic calcifications. Mild proximal left subclavian artery narrowing secondary to atheromatous plaque. Otherwise patent great vessel origins. Right carotid system: Patent. Minimal proximal ICA atherosclerotic calcifications without significant narrowing. Left carotid system: Patent. 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 Vertebral arteries: Mild left vertebral artery origin narrowing secondary to atheromatous disease. Otherwise patent and codominant. Skeleton: No acute finding.  Mild spondylosis. Other neck: No adenopathy.  No soft tissue mass. Upper chest: Patchy and nodular bilateral upper lung opacities concerning for infectious/inflammatory foci. Review of the MIP images confirms the above findings CTA HEAD FINDINGS Anterior circulation: Minimal carotid siphon atherosclerotic calcifications. Patent ICAs. Patent ophthalmic artery origins. Right A1 segment hypoplasia. Patent ACAs. Patent right  MCA. Left M2 segment occlusion. Posterior circulation: Patent V4 segments. Patent right PICA. Patent basilar and superior cerebellar arteries. Patent bilateral PCAs. Venous sinuses: As permitted by contrast timing, patent. Anatomic variants: Please see above. Review of the MIP images confirms the above findings IMPRESSION: CTA neck: 30-40% distal left CCA narrowing secondary to atheromatous plaque. Mild narrowing of the proximal left subclavian and left vertebral artery origin. Bilateral upper lung nodular/patchy opacities concerning for infectious/inflammatory foci. CTA head: Left M2 branch occlusion. No aneurysm or dissection. Code stroke imaging results were communicated on 11/24/2020 at 4:41 pm to provider Dr. ARory Percyvia secure text paging. Electronically Signed   By: CPrimitivo GauzeM.D.   On: 11/24/2020 16:41   CT ANGIO NECK CODE STROKE  Result Date: 11/24/2020 CLINICAL DATA:  Neuro deficit, acute, stroke suspected EXAM: CT ANGIOGRAPHY HEAD AND NECK TECHNIQUE: Multidetector CT imaging of the head and neck was performed using the standard protocol during bolus administration of intravenous contrast. Multiplanar CT image reconstructions and MIPs were obtained to evaluate the vascular anatomy. Carotid stenosis measurements (when applicable) are obtained utilizing NASCET criteria, using the distal internal carotid diameter as the denominator. CONTRAST:  620mOMNIPAQUE IOHEXOL 350 MG/ML SOLN COMPARISON:  None. FINDINGS: CTA NECK FINDINGS Aortic arch: Standard branching. Mild aortic arch atherosclerotic calcifications. Mild proximal left subclavian artery narrowing secondary to atheromatous plaque. Otherwise patent great vessel origins. Right carotid system: Patent. Minimal proximal ICA atherosclerotic calcifications without significant narrowing. Left carotid system: Patent. 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 Vertebral arteries: Mild left vertebral artery origin narrowing  secondary to atheromatous disease. Otherwise patent and codominant. Skeleton: No acute finding.  Mild spondylosis. Other neck: No adenopathy.  No soft tissue mass. Upper chest: Patchy and nodular bilateral upper lung opacities concerning for infectious/inflammatory foci. Review of the MIP images confirms the above findings CTA HEAD FINDINGS Anterior circulation: Minimal carotid siphon atherosclerotic calcifications. Patent ICAs. Patent ophthalmic  artery origins. Right A1 segment hypoplasia. Patent ACAs. Patent right MCA. Left M2 segment occlusion. Posterior circulation: Patent V4 segments. Patent right PICA. Patent basilar and superior cerebellar arteries. Patent bilateral PCAs. Venous sinuses: As permitted by contrast timing, patent. Anatomic variants: Please see above. Review of the MIP images confirms the above findings IMPRESSION: CTA neck: 30-40% distal left CCA narrowing secondary to atheromatous plaque. Mild narrowing of the proximal left subclavian and left vertebral artery origin. Bilateral upper lung nodular/patchy opacities concerning for infectious/inflammatory foci. CTA head: Left M2 branch occlusion. No aneurysm or dissection. Code stroke imaging results were communicated on 11/24/2020 at 4:41 pm to provider Dr. Rory Percy via secure text paging. Electronically Signed   By: Primitivo Gauze M.D.   On: 11/24/2020 16:41

## 2020-12-09 DIAGNOSIS — I639 Cerebral infarction, unspecified: Secondary | ICD-10-CM | POA: Diagnosis not present

## 2020-12-09 LAB — GLUCOSE, CAPILLARY
Glucose-Capillary: 199 mg/dL — ABNORMAL HIGH (ref 70–99)
Glucose-Capillary: 222 mg/dL — ABNORMAL HIGH (ref 70–99)
Glucose-Capillary: 219 mg/dL — ABNORMAL HIGH (ref 70–99)
Glucose-Capillary: 198 mg/dL — ABNORMAL HIGH (ref 70–99)

## 2020-12-09 MED ORDER — INSULIN DETEMIR 100 UNIT/ML ~~LOC~~ SOLN
14.0000 [IU] | Freq: Every day | SUBCUTANEOUS | Status: DC
  Administered 2020-12-09 – 2020-12-10 (×2): 14 [IU] via SUBCUTANEOUS
  Filled 2020-12-09 (×2): qty 0.14

## 2020-12-09 MED ORDER — ONDANSETRON HCL 4 MG/2ML IJ SOLN
4.0000 mg | Freq: Four times a day (QID) | INTRAMUSCULAR | Status: DC | PRN
  Administered 2020-12-09: 4 mg via INTRAVENOUS
  Filled 2020-12-09: qty 2

## 2020-12-09 NOTE — Plan of Care (Signed)
  Problem: Nutrition: Goal: Adequate nutrition will be maintained Outcome: Progressing   Problem: Coping: Goal: Level of anxiety will decrease Outcome: Progressing   Problem: Pain Managment: Goal: General experience of comfort will improve Outcome: Progressing   Problem: Safety: Goal: Ability to remain free from injury will improve Outcome: Progressing   Problem: Skin Integrity: Goal: Risk for impaired skin integrity will decrease Outcome: Progressing   Problem: Education: Goal: Knowledge of disease or condition will improve Outcome: Progressing Goal: Knowledge of secondary prevention will improve Outcome: Progressing Goal: Knowledge of patient specific risk factors addressed and post discharge goals established will improve Outcome: Progressing

## 2020-12-09 NOTE — Progress Notes (Signed)
PROGRESS NOTE                                                                                                                                                                                                             Patient Demographics:    Emily Lozano, is a 70 y.o. female, DOB - 1951/03/29, OFB:[id number]  Outpatient Primary MD for the patient is Marinda Elk, MD    LOS - 15  Admit date - 11/24/2020    Chief Complaint  Patient presents with  . Code Stroke       Brief Narrative (HPI from H&P)   - Emily Lozano is a 70 y.o. female Hx of multiple cerebrovascular risk factors including hypertension, atrial fibrillation, carotid artery disease, dyslipidemia, DM type II who presented with sudden onset of left gaze preference, right facial droop, hemiparesis and expressive aphasia still with left hemispheric dysfunction. CTA head and neck with left M2 occlusion and though outside the window for IV TPA was a candidate for intervention now s/p unsuccessful revascularization, though there was some distal movement of the clot from M2 to M3; TIKI3, also found to have COVID-19 pneumonia, was seen by critical care required intubation to protect her airway, case complicated by development of right lower extremity DVT requiring heparin drip.  She was transferred to [facility] care on 12/02/2020 on day 8 of her hospital stay.   Subjective:   Patient patient sitting in a chair, appears to be more awake, try to communicate today, she is unable to provide any reliable complaints, no significant events as discussed with staff.     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  In a patient with underlying A. fib, hypertension, dyslipidemia and DM type II. - She has been under neuro service, has had thorough stroke work-up, underwent unsuccessful  revascularization attempt by IR. -Unfortunately she is with residual  dense right-sided weakness and expressive aphasia along with some dysphagia, by PT/OT, will need placement.  . - Currently on aspirin, statin along with Eliquis for anticoagulation. - she remains with dysphagia, followed closely by SLP, she is on dysphagia 2 diet with nectar thick liquid.her oral intake has improved, per staff she did report some nausea today, so she was ordered as needed Zofran .  Dyslipidemia.  - LDL was 70 on rosuvastatin 10 mg.  Continue  Paroxysmal A. fib RVR.  - [place] vas 2 score of greater than 5, continue beta-blocker along  Eliquis.  Echo shows a EF of 55 to 60% no acute findings  Essential hypertension.   -Blood pressure currently controlled on Norvasc and metoprolol .  Right lower extremity DVT.   - Continue Eliquis.  History of smoking.   COVID-19 pneumonia with acute hypoxic respiratory failure.  Required intubation and ICU stay, has completed Remdesivir course, tapering down Decadron.  She had some post intubation laryngeal edema and stridor which is much improved, currently on room air. -She is currently back on room air with no oxygen requirement. -Needs 21 days of isolation.  DM type II.  On Levemir along with sliding scale, continue, dose adjusted as steroids being tapered.  Lab Results  Component Value Date   HGBA1C 10.6 (H) 11/25/2020   Lab Results  Component Value Date   CHOL 139 11/25/2020   HDL 15 (L) 11/25/2020   LDLCALC UNABLE TO CALCULATE IF TRIGLYCERIDE OVER 400 mg/dL 11/25/2020   LDLDIRECT 70.6 11/28/2020   TRIG 212 (H) 12/01/2020   CHOLHDL 9.3 11/25/2020   CBG (last 3)  Recent Labs    12/08/20 1648 12/09/20 0749 12/09/20 1219  GLUCAP 186* 199* 222*            Condition - Extremely Guarded  Family Communication : none at bedside  Code Status : Full  Consults  :  Neuro, PCCM  PUD Prophylaxis :  PPI   Procedures  :     Intubated and extubated  in ICU.  Unsuccessful attempt at left M2 occlusion removal by IR      Disposition Plan  :    Status is: Inpatient  Remains inpatient appropriate because:IV treatments appropriate due to intensity of illness or inability to take PO   Dispo: The patient is from: Home              Anticipated d/c is to: SNF              Patient currently is not medically stable to d/c.  To go to [facility] on Monday.   Difficult to place patient No  DVT Prophylaxis  :  Heparin >> Eliquis  Lab Results  Component Value Date   PLT 438 (H) 12/07/2020    Diet :  Diet Order            DIET DYS 2 Room service appropriate? No; Fluid consistency: Nectar Thick  Diet effective now                  Inpatient Medications  Scheduled Meds: . amLODipine  10 mg Oral Daily  . apixaban  5 mg Oral BID  . chlorhexidine  15 mL Mouth Rinse BID  . feeding supplement  237 mL Oral TID BM  . insulin aspart  0-15 Units Subcutaneous TID WC  . insulin detemir  14 Units Subcutaneous Daily  . mouth rinse  15 mL Mouth Rinse q12n4p  . metoprolol tartrate  50 mg Oral BID  . mometasone-formoterol  2 puff Inhalation BID  . pantoprazole  40 mg Oral QHS  . rosuvastatin  10 mg Oral Daily  . sodium chloride flush  3 mL Intravenous Once   Continuous Infusions:  PRN Meds:.acetaminophen **OR** acetaminophen (TYLENOL) oral liquid 160 mg/5 mL **OR** acetaminophen, hydrALAZINE, ondansetron (ZOFRAN) IV, Resource ThickenUp Clear, senna-docusate  Antibiotics  :  Anti-infectives (From admission, onward)   Start     Dose/Rate Route Frequency Ordered Stop   11/25/20 1000  remdesivir 100 mg in sodium chloride 0.9 % 100 mL IVPB       "Followed by" Linked Group Details   100 mg 200 mL/hr over 30 Minutes Intravenous Daily 11/24/20 2119 11/28/20 1119   11/24/20 2215  remdesivir 200 mg in sodium chloride 0.9% 250 mL IVPB       "Followed by" Linked Group Details   200 mg 580 mL/hr over 30 Minutes Intravenous Once 11/24/20  2119 11/25/20 0932        Kathryn Linarez M.D on 12/09/2020 at 2:56 PM  To page go to [email or web address]   [facility] -  Office  [phone]    See all Orders from today for further details    Objective:   Vitals:   12/08/20 1609 12/08/20 2030 12/09/20 0744 12/09/20 1205  BP: 132/78 (!) 112/59 (!) 130/98 (!) 107/59  Pulse: 83 76 72 62  Resp: (!) $RemoveB'25 20 20 18  'uEXyUwpO$ Temp: 98.2 F (36.8 C) 98.2 F (36.8 C) 98.2 F (36.8 C) 98.2 F (36.8 C)  TempSrc: Axillary Axillary Axillary Axillary  SpO2: 95% 96% 94% 98%  Weight:      Height:        Wt Readings from Last 3 Encounters:  12/08/20 68.2 kg  05/25/18 74.4 kg  12/10/17 76.7 kg     Intake/Output Summary (Last 24 hours) at 12/09/2020 1456 Last data filed at 12/09/2020 2778 Gross per 24 hour  Intake 545 ml  Output --  Net 545 ml     Physical Exam  Is awake today, trying to communicate and verbalize some words, but she remainswith dense right-sided hemiparesis, with expressive aphasia and neglect . Awake Alert, Oriented X 3, No new F.N deficits, Normal affect Symmetrical Chest wall movement, Good air movement bilaterally, CTAB RRR,No Gallops,Rubs or new Murmurs, No Parasternal Heave +ve B.Sounds, Abd Soft, No tenderness, No rebound - guarding or rigidity. No Cyanosis, Clubbing or edema.     Data Review:    CBC Recent Labs  Lab 12/03/20 0118 12/04/20 0724 12/06/20 0232 12/07/20 0642  WBC 16.4* 12.0* 8.8 9.9  HGB 11.3* 11.4* 13.2 12.9  HCT 36.5 36.8 41.3 42.4  PLT 437* 482* 408* 438*  MCV 78.3* 79.8* 78.8* 80.3  MCH 24.2* 24.7* 25.2* 24.4*  MCHC 31.0 31.0 32.0 30.4  RDW 17.0* 17.4* 18.5* 18.0*  LYMPHSABS 1.3 2.2 1.7  --   MONOABS 0.7 0.6 0.9  --   EOSABS 0.1 0.2 0.1  --   BASOSABS 0.1 0.1 0.1  --     Recent Labs  Lab 12/03/20 0118 12/04/20 0724 12/05/20 0058 12/06/20 0232 12/07/20 0642  NA 134* 136 138 136 135  K 3.6 3.5 4.0 4.3 4.1  CL 102 103 107 106 106  CO2 22 20* 11* 21* 19*   GLUCOSE 162* 120* 192* 216* 207*  BUN $Re'11 15 15 22 22  'ujq$ CREATININE 0.70 0.69 0.63 0.90 0.76  CALCIUM 8.5* 8.8* 9.2 8.6* 8.7*  AST $Re'16 16 17 16  'nMN$ --   ALT $Re'18 20 20 15  'hbt$ --   ALKPHOS 82 87 84 67  --   BILITOT 0.9 0.7 0.7 0.6  --   ALBUMIN 2.5* 2.8* 2.9* 2.5*  --   MG 2.3 2.3 2.3 2.3  --   CRP 5.9* 1.6* 0.8 0.7  --   PROCALCITON <0.10 0.12 <0.10  --   --  BNP 386.1* 174.0* 110.0* 37.1  --     ------------------------------------------------------------------------------------------------------------------ No results for input(s): CHOL, HDL, LDLCALC, TRIG, CHOLHDL, LDLDIRECT in the last 72 hours.  Lab Results  Component Value Date   HGBA1C 10.6 (H) 11/25/2020   ------------------------------------------------------------------------------------------------------------------ No results for input(s): TSH, T4TOTAL, T3FREE, THYROIDAB in the last 72 hours.  Invalid input(s): FREET3  Cardiac Enzymes No results for input(s): CKMB, TROPONINI, MYOGLOBIN in the last 168 hours.  Invalid input(s): CK ------------------------------------------------------------------------------------------------------------------    Component Value Date/Time   BNP 37.1 12/06/2020 0232    Micro Results No results found for this or any previous visit (from the past 240 hour(s)).  Radiology Reports CT HEAD WO CONTRAST  Result Date: 12/06/2020 CLINICAL DATA:  Stroke follow-up EXAM: CT HEAD WITHOUT CONTRAST TECHNIQUE: Contiguous axial images were obtained from the base of the skull through the vertex without intravenous contrast. COMPARISON:  12/02/2020 FINDINGS: Brain: Expected evolution of left MCA territory infarct with decreased amount of cytotoxic edema. No hemorrhage. There is no midline shift or other mass effect. Severe chronic white matter disease is unchanged. No hydrocephalus. Vascular: No hyperdense vessel or unexpected calcification. Skull: Normal. Negative for fracture or focal lesion.  Sinuses/Orbits: No acute finding. Other: None. IMPRESSION: Decreased edema at site of left MCA territory infarct without hemorrhage or mass effect. Electronically Signed   By: Ulyses Jarred M.D.   On: 12/06/2020 00:28   CT HEAD WO CONTRAST  Result Date: 12/02/2020 CLINICAL DATA:  Stroke follow-up EXAM: CT HEAD WITHOUT CONTRAST TECHNIQUE: Contiguous axial images were obtained from the base of the skull through the vertex without intravenous contrast. COMPARISON:  Brain MRI 11/25/2020 FINDINGS: Brain: Large superior division left MCA territory infarct matching the area of diffusion abnormality on prior. Mild petechial hemorrhage is present and non progressed from MRI. No midline shift. Background of chronic small vessel ischemia in the cerebral white matter. No hydrocephalus or masslike finding. Vascular: Unchanged Skull: Normal. Negative for fracture or focal lesion. Sinuses/Orbits: Negative IMPRESSION: 1. Large recent left MCA territory infarct with mild petechial hemorrhage. No progression since brain MRI 11/25/2020. 2. Extensive chronic small vessel ischemia. Electronically Signed   By: Monte Fantasia M.D.   On: 12/02/2020 11:14   MR BRAIN WO CONTRAST  Result Date: 11/25/2020 CLINICAL DATA:  Acute stroke presentation 11/24/2020. Left insular infarction. EXAM: MRI HEAD WITHOUT CONTRAST TECHNIQUE: Multiplanar, multiecho pulse sequences of the brain and surrounding structures were obtained without intravenous contrast. COMPARISON:  CT and interventional studies over the last day. FINDINGS: Brain: Diffusion imaging shows acute infarction in the deep insula and left frontoparietal cortical and subcortical brain. Mild swelling in the region. There are either minimal petechial blood products or there is susceptibility artifact related to thrombosed vessels. No frank hematoma. No mass effect or shift. Elsewhere, the brainstem and cerebellum are unremarkable. Both cerebral hemispheres show moderate to severe  chronic small-vessel ischemic changes throughout the white matter. No hydrocephalus. No extra-axial collection. Vascular: Major vessels at base of the brain show flow. Skull and upper cervical spine: Negative Sinuses/Orbits: Mucosal inflammatory changes of paranasal sinuses. Orbits negative. Other: None IMPRESSION: 1. Acute infarction in the left MCA territory affecting the deep insula and left frontoparietal cortical and subcortical brain. Minimal petechial blood products or susceptibility artifact related to thrombosed vessels. No frank hematoma. No mass effect or shift. 2. Moderate to severe chronic small-vessel ischemic changes elsewhere throughout the cerebral hemispheric white matter. Electronically Signed   By: Nelson Chimes M.D.   On: 11/25/2020 02:18  [place]  Result Date: 11/27/2020 INDICATION: 70 year old female with past medical history significant for rheumatoid arthritis, diabetes mellitus, hypertension, anemia and melanoma presenting with acute onset of right hemiparesis, aphasia and left gaze preference. She was found to be covered positive today. Last known well at 10:30 a.m. on 11/24/2020; modified Rankin scale 0; NIHSS 7. No IV tPA given as she was outside the window. Head CT showed an acute infarct the in the left insula and CT angiogram showed intracranial atherosclerotic disease with a left M2/MCA occlusion. EXAM: Ultrasound-guided vascular access Diagnostic cerebral angiogram Mechanical thrombectomy Flat panel head CT COMPARISON:  CT/CT angiogram of the head and neck November 24, 2020. MEDICATIONS: Refer to anesthesia documentation. ANESTHESIA/SEDATION: The procedure was performed under general anesthesia CONTRAST:  For 75 mL of Omnipaque 240 mg/mL FLUOROSCOPY TIME:  Fluoroscopy Time: 24 minutes 54 seconds (1,520 mGy). COMPLICATIONS: SIR Level A - No therapy, no consequence. TECHNIQUE: Informed written consent was obtained from the patient's son after a thorough discussion of the  procedural risks, benefits and alternatives. All questions were addressed. Maximal Sterile Barrier Technique was utilized including caps, mask, sterile gowns, sterile gloves, sterile drape, hand hygiene and skin antiseptic. A timeout was performed prior to the initiation of the procedure. The right groin was prepped and draped in the usual sterile fashion. Using a micropuncture kit and the modified Seldinger technique, access was gained to the right common femoral artery and an 8 French sheath was placed. Real-time ultrasound guidance was utilized for vascular access including the acquisition of a permanent ultrasound image documenting patency of the accessed vessel. Under fluoroscopy, a Zoom 88 guide catheter was navigated over a 6 [place] Berenstein 2 catheter and a 0.035" Terumo Glidewire into the aortic arch. The catheter was placed into the left common carotid artery and then advanced into the left internal carotid artery. The inner catheter was removed. Frontal and lateral angiograms of the head were obtained. FINDINGS: 1. Occlusion of the mid left M2/MCA middle division branch. 2. Intracranial atherosclerotic disease with multiple luminal irregularities along the left MCA and left ACA vascular tree more significant at the A3 segment where there is moderate stenosis. 3. Patent right common femoral artery. PROCEDURE: Under biplane roadmap, a zoom 55 aspiration catheter was navigated over an Aristotle 18 microguidewire into the cavernous segment of the left ICA and then advanced to the level of occlusion. The aspiration catheter was connected to a penumbra aspiration pump. The guiding catheter balloon was inflated. The aspiration catheter were removed under constant aspiration. Follow-up left ICA angiograms showed persistent occlusion which appear to have progressed more distally. Under biplane roadmap, a zoom 55 aspiration catheter was navigated over an Aristotle 18 microguidewire into the cavernous segment of  the left ICA and then advanced to the level of occlusion. The aspiration catheter was connected to a penumbra aspiration pump. The guiding catheter balloon was inflated. The aspiration catheter were removed under constant aspiration. Follow-up left ICA angiograms showed no significant change. Under biplane roadmap, a zoom 55 aspiration catheter was navigated over a phenom 21 microcatheter and a Aristotle 18 microguidewire into the cavernous segment of the right ICA. The microcatheter was then navigated over the wire into the left M3/MCA middle division branch. Then, a 4 x 40 mm solitaire stent retriever was deployed spanning the left M2 and proximal M3 segment. The device was allowed to intercalated with the clot for 4 minutes. The microcatheter was removed. The aspiration catheter was advanced to the level of occlusion  and connected to a penumbra aspiration pump. The thrombectomy device and aspiration catheter were removed under constant aspiration. Follow-up left ICA angiogram showed significant change. Under biplane roadmap, a zoom 55 aspiration catheter was navigated over a phenom 21 microcatheter and a Aristotle 18 microguidewire into the cavernous segment of the right ICA. The microcatheter was then navigated over the wire into the left M3/MCA middle division branch. Then, a 4 x 40 mm solitaire stent retriever was deployed spanning the left M2 and proximal M3 segment. The device was allowed to intercalated with the clot for 4 minutes. The microcatheter was removed. The aspiration catheter was advanced to the level of occlusion and connected to a penumbra aspiration pump. The thrombectomy device and aspiration catheter were removed under constant aspiration. Follow-up angiogram showed persistent occlusion. Flat panel CT of the head was obtained and post processed in a separate workstation with concurrent attending physician supervision. Selected images were sent to PACS. Small contrast extravasation in the left  sylvian fissure noted. Follow-up angiogram showed no evidence of active contrast extravasation with persistent occlusion of a left M2 branch. Delay flat panel CT of the head was obtained and post processed in a separate workstation with concurrent attending physician supervision. Selected images were sent to PACS. Left sylvian contrast extravasation appears stable. Delayed left ICA angiograms confirmed no evidence of contrast extravasation. The catheter was subsequently withdrawn. Left common femoral artery angiograms were obtained in frontal and lateral views. The puncture is at the level of the common femoral artery which has adequate caliber for closure device. The sheath was exchanged for a Perclose ProGlide which was utilized for access closure. Immediate hemostasis was achieved. IMPRESSION: Unsuccessful attempted recanalization of a mid left M2/MCA middle division branch complicated by small amount contrast extravasation in the left sylvian fissure. No active extravasation seen at the end of the procedure. Left MCA status remains TICI2b, which is similar to presentation. PLAN: Patient is to remain intubated given unsuccessful recanalization and COVID positive test. She will be transferred to ICU for continued care. Electronically Signed   By: Pedro Earls M.D.   On: 11/27/2020 09:25   IR CT Head Ltd  Result Date: 11/27/2020 INDICATION: 70 year old female with past medical history significant for rheumatoid arthritis, diabetes mellitus, hypertension, anemia and melanoma presenting with acute onset of right hemiparesis, aphasia and left gaze preference. She was found to be covered positive today. Last known well at 10:30 a.m. on 11/24/2020; modified Rankin scale 0; NIHSS 7. No IV tPA given as she was outside the window. Head CT showed an acute infarct the in the left insula and CT angiogram showed intracranial atherosclerotic disease with a left M2/MCA occlusion. EXAM: Ultrasound-guided  vascular access Diagnostic cerebral angiogram Mechanical thrombectomy Flat panel head CT COMPARISON:  CT/CT angiogram of the head and neck November 24, 2020. MEDICATIONS: Refer to anesthesia documentation. ANESTHESIA/SEDATION: The procedure was performed under general anesthesia CONTRAST:  For 75 mL of Omnipaque 240 mg/mL FLUOROSCOPY TIME:  Fluoroscopy Time: 24 minutes 54 seconds (1,520 mGy). COMPLICATIONS: SIR Level A - No therapy, no consequence. TECHNIQUE: Informed written consent was obtained from the patient's son after a thorough discussion of the procedural risks, benefits and alternatives. All questions were addressed. Maximal Sterile Barrier Technique was utilized including caps, mask, sterile gowns, sterile gloves, sterile drape, hand hygiene and skin antiseptic. A timeout was performed prior to the initiation of the procedure. The right groin was prepped and draped in the usual sterile fashion. Using a micropuncture kit  and the modified Seldinger technique, access was gained to the right common femoral artery and an 8 French sheath was placed. Real-time ultrasound guidance was utilized for vascular access including the acquisition of a permanent ultrasound image documenting patency of the accessed vessel. Under fluoroscopy, a Zoom 88 guide catheter was navigated over a 6 [place] Berenstein 2 catheter and a 0.035" Terumo Glidewire into the aortic arch. The catheter was placed into the left common carotid artery and then advanced into the left internal carotid artery. The inner catheter was removed. Frontal and lateral angiograms of the head were obtained. FINDINGS: 1. Occlusion of the mid left M2/MCA middle division branch. 2. Intracranial atherosclerotic disease with multiple luminal irregularities along the left MCA and left ACA vascular tree more significant at the A3 segment where there is moderate stenosis. 3. Patent right common femoral artery. PROCEDURE: Under biplane roadmap, a zoom 55 aspiration  catheter was navigated over an Aristotle 18 microguidewire into the cavernous segment of the left ICA and then advanced to the level of occlusion. The aspiration catheter was connected to a penumbra aspiration pump. The guiding catheter balloon was inflated. The aspiration catheter were removed under constant aspiration. Follow-up left ICA angiograms showed persistent occlusion which appear to have progressed more distally. Under biplane roadmap, a zoom 55 aspiration catheter was navigated over an Aristotle 18 microguidewire into the cavernous segment of the left ICA and then advanced to the level of occlusion. The aspiration catheter was connected to a penumbra aspiration pump. The guiding catheter balloon was inflated. The aspiration catheter were removed under constant aspiration. Follow-up left ICA angiograms showed no significant change. Under biplane roadmap, a zoom 55 aspiration catheter was navigated over a phenom 21 microcatheter and a Aristotle 18 microguidewire into the cavernous segment of the right ICA. The microcatheter was then navigated over the wire into the left M3/MCA middle division branch. Then, a 4 x 40 mm solitaire stent retriever was deployed spanning the left M2 and proximal M3 segment. The device was allowed to intercalated with the clot for 4 minutes. The microcatheter was removed. The aspiration catheter was advanced to the level of occlusion and connected to a penumbra aspiration pump. The thrombectomy device and aspiration catheter were removed under constant aspiration. Follow-up left ICA angiogram showed significant change. Under biplane roadmap, a zoom 55 aspiration catheter was navigated over a phenom 21 microcatheter and a Aristotle 18 microguidewire into the cavernous segment of the right ICA. The microcatheter was then navigated over the wire into the left M3/MCA middle division branch. Then, a 4 x 40 mm solitaire stent retriever was deployed spanning the left M2 and proximal M3  segment. The device was allowed to intercalated with the clot for 4 minutes. The microcatheter was removed. The aspiration catheter was advanced to the level of occlusion and connected to a penumbra aspiration pump. The thrombectomy device and aspiration catheter were removed under constant aspiration. Follow-up angiogram showed persistent occlusion. Flat panel CT of the head was obtained and post processed in a separate workstation with concurrent attending physician supervision. Selected images were sent to PACS. Small contrast extravasation in the left sylvian fissure noted. Follow-up angiogram showed no evidence of active contrast extravasation with persistent occlusion of a left M2 branch. Delay flat panel CT of the head was obtained and post processed in a separate workstation with concurrent attending physician supervision. Selected images were sent to PACS. Left sylvian contrast extravasation appears stable. Delayed left ICA angiograms confirmed no evidence of contrast extravasation.  The catheter was subsequently withdrawn. Left common femoral artery angiograms were obtained in frontal and lateral views. The puncture is at the level of the common femoral artery which has adequate caliber for closure device. The sheath was exchanged for a Perclose ProGlide which was utilized for access closure. Immediate hemostasis was achieved. IMPRESSION: Unsuccessful attempted recanalization of a mid left M2/MCA middle division branch complicated by small amount contrast extravasation in the left sylvian fissure. No active extravasation seen at the end of the procedure. Left MCA status remains TICI2b, which is similar to presentation. PLAN: Patient is to remain intubated given unsuccessful recanalization and COVID positive test. She will be transferred to ICU for continued care. Electronically Signed   By: Pedro Earls M.D.   On: 11/27/2020 09:25   IR US Guide Vasc Access Right  Result Date:  11/27/2020 INDICATION: 70 year old female with past medical history significant for rheumatoid arthritis, diabetes mellitus, hypertension, anemia and melanoma presenting with acute onset of right hemiparesis, aphasia and left gaze preference. She was found to be covered positive today. Last known well at 10:30 a.m. on 11/24/2020; modified Rankin scale 0; NIHSS 7. No IV tPA given as she was outside the window. Head CT showed an acute infarct the in the left insula and CT angiogram showed intracranial atherosclerotic disease with a left M2/MCA occlusion. EXAM: Ultrasound-guided vascular access Diagnostic cerebral angiogram Mechanical thrombectomy Flat panel head CT COMPARISON:  CT/CT angiogram of the head and neck November 24, 2020. MEDICATIONS: Refer to anesthesia documentation. ANESTHESIA/SEDATION: The procedure was performed under general anesthesia CONTRAST:  For 75 mL of Omnipaque 240 mg/mL FLUOROSCOPY TIME:  Fluoroscopy Time: 24 minutes 54 seconds (1,520 mGy). COMPLICATIONS: SIR Level A - No therapy, no consequence. TECHNIQUE: Informed written consent was obtained from the patient's son after a thorough discussion of the procedural risks, benefits and alternatives. All questions were addressed. Maximal Sterile Barrier Technique was utilized including caps, mask, sterile gowns, sterile gloves, sterile drape, hand hygiene and skin antiseptic. A timeout was performed prior to the initiation of the procedure. The right groin was prepped and draped in the usual sterile fashion. Using a micropuncture kit and the modified Seldinger technique, access was gained to the right common femoral artery and an 8 French sheath was placed. Real-time ultrasound guidance was utilized for vascular access including the acquisition of a permanent ultrasound image documenting patency of the accessed vessel. Under fluoroscopy, a Zoom 88 guide catheter was navigated over a 6 [place] Berenstein 2 catheter and a 0.035" Terumo Glidewire into  the aortic arch. The catheter was placed into the left common carotid artery and then advanced into the left internal carotid artery. The inner catheter was removed. Frontal and lateral angiograms of the head were obtained. FINDINGS: 1. Occlusion of the mid left M2/MCA middle division branch. 2. Intracranial atherosclerotic disease with multiple luminal irregularities along the left MCA and left ACA vascular tree more significant at the A3 segment where there is moderate stenosis. 3. Patent right common femoral artery. PROCEDURE: Under biplane roadmap, a zoom 55 aspiration catheter was navigated over an Aristotle 18 microguidewire into the cavernous segment of the left ICA and then advanced to the level of occlusion. The aspiration catheter was connected to a penumbra aspiration pump. The guiding catheter balloon was inflated. The aspiration catheter were removed under constant aspiration. Follow-up left ICA angiograms showed persistent occlusion which appear to have progressed more distally. Under biplane roadmap, a zoom 55 aspiration catheter was navigated over an Aristotle  18 microguidewire into the cavernous segment of the left ICA and then advanced to the level of occlusion. The aspiration catheter was connected to a penumbra aspiration pump. The guiding catheter balloon was inflated. The aspiration catheter were removed under constant aspiration. Follow-up left ICA angiograms showed no significant change. Under biplane roadmap, a zoom 55 aspiration catheter was navigated over a phenom 21 microcatheter and a Aristotle 18 microguidewire into the cavernous segment of the right ICA. The microcatheter was then navigated over the wire into the left M3/MCA middle division branch. Then, a 4 x 40 mm solitaire stent retriever was deployed spanning the left M2 and proximal M3 segment. The device was allowed to intercalated with the clot for 4 minutes. The microcatheter was removed. The aspiration catheter was advanced to  the level of occlusion and connected to a penumbra aspiration pump. The thrombectomy device and aspiration catheter were removed under constant aspiration. Follow-up left ICA angiogram showed significant change. Under biplane roadmap, a zoom 55 aspiration catheter was navigated over a phenom 21 microcatheter and a Aristotle 18 microguidewire into the cavernous segment of the right ICA. The microcatheter was then navigated over the wire into the left M3/MCA middle division branch. Then, a 4 x 40 mm solitaire stent retriever was deployed spanning the left M2 and proximal M3 segment. The device was allowed to intercalated with the clot for 4 minutes. The microcatheter was removed. The aspiration catheter was advanced to the level of occlusion and connected to a penumbra aspiration pump. The thrombectomy device and aspiration catheter were removed under constant aspiration. Follow-up angiogram showed persistent occlusion. Flat panel CT of the head was obtained and post processed in a separate workstation with concurrent attending physician supervision. Selected images were sent to PACS. Small contrast extravasation in the left sylvian fissure noted. Follow-up angiogram showed no evidence of active contrast extravasation with persistent occlusion of a left M2 branch. Delay flat panel CT of the head was obtained and post processed in a separate workstation with concurrent attending physician supervision. Selected images were sent to PACS. Left sylvian contrast extravasation appears stable. Delayed left ICA angiograms confirmed no evidence of contrast extravasation. The catheter was subsequently withdrawn. Left common femoral artery angiograms were obtained in frontal and lateral views. The puncture is at the level of the common femoral artery which has adequate caliber for closure device. The sheath was exchanged for a Perclose ProGlide which was utilized for access closure. Immediate hemostasis was achieved. IMPRESSION:  Unsuccessful attempted recanalization of a mid left M2/MCA middle division branch complicated by small amount contrast extravasation in the left sylvian fissure. No active extravasation seen at the end of the procedure. Left MCA status remains TICI2b, which is similar to presentation. PLAN: Patient is to remain intubated given unsuccessful recanalization and COVID positive test. She will be transferred to ICU for continued care. Electronically Signed   By: Pedro Earls M.D.   On: 11/27/2020 09:25   CT CEREBRAL PERFUSION W CONTRAST  Result Date: 11/24/2020 CLINICAL DATA:  Neuro deficit, acute, stroke suspected EXAM: CT PERFUSION BRAIN TECHNIQUE: Multiphase CT imaging of the brain was performed following IV bolus contrast injection. Subsequent parametric perfusion maps were calculated using RAPID software. CONTRAST:  17mL OMNIPAQUE IOHEXOL 350 MG/ML SOLN COMPARISON:  Concurrent CTA head and neck. FINDINGS: CT Brain Perfusion Findings: CBF (<30%) Volume: 1mL Perfusion (Tmax>6.0s) volume: 22mL Mismatch Volume: 4mL ASPECTS on noncontrast CT Head: 9 at 16:42 today. Infarct Core: 0 mL Infarction Location:Not applicable. IMPRESSION: Perfusion  imaging demonstrates no infarct core. 30 mL region of ischemia involving the left MCA territory. Electronically Signed   By: Primitivo Gauze M.D.   On: 11/24/2020 16:55   DG Chest Port 1 View  Result Date: 12/03/2020 CLINICAL DATA:  Shortness of breath.  COVID-19 positive EXAM: PORTABLE CHEST 1 VIEW COMPARISON:  December 02, 2020 FINDINGS: Patchy airspace opacity is noted in the lung bases, more on the right than on the left. Lungs elsewhere clear. Heart is upper normal in size with pulmonary vascularity normal. No adenopathy. There is aortic atherosclerosis. There is calcification in each carotid artery. No bone lesions. IMPRESSION: Persistent airspace opacity bases, more on the right than the left, likely due to atypical organism pneumonia. No new opacity  evident. Stable cardiac silhouette. Aortic Atherosclerosis (ICD10-I70.0). There are foci of carotid artery calcification bilaterally. Electronically Signed   By: Lowella Grip III M.D.   On: 12/03/2020 08:41   DG Chest Port 1 View  Result Date: 12/02/2020 CLINICAL DATA:  Shortness of breath EXAM: PORTABLE CHEST 1 VIEW COMPARISON:  November 24, 2020 FINDINGS: The cardiomediastinal silhouette is unchanged in contour.Interval extubation. Atherosclerotic calcifications of the aorta. No pleural effusion. No pneumothorax. Patchy RIGHT basilar peripheral opacities, similar in comparison to prior. Mildly improved aeration of the LEFT lung base in comparison to prior. Visualized abdomen is unremarkable. Multilevel degenerative changes of the thoracic spine. IMPRESSION: 1. Patchy RIGHT basilar peripheral opacities, similar in comparison to prior and consistent with the sequela of COVID-19 infection. 2. Mildly improved aeration of the LEFT lung base in comparison to prior. Electronically Signed   By: Valentino Saxon MD   On: 12/02/2020 11:26   DG Chest Port 1 View  Result Date: 11/24/2020 CLINICAL DATA:  COVID positive EXAM: PORTABLE CHEST 1 VIEW COMPARISON:  None. FINDINGS: The heart size and mediastinal contours are mildly enlarged. ETT is 4 cm above the carina. NG tube is seen below the diaphragm. Aortic knob calcifications are seen. There is hazy patchy airspace opacity seen at the periphery of the right lower lung and at the left lung base. No pleural effusion. IMPRESSION: ETT and NG tube in satisfactory position Bilateral multifocal airspace opacities, consistent with multifocal pneumonia Electronically Signed   By: Prudencio Pair M.D.   On: 11/24/2020 22:14   IR PERCUTANEOUS ART THROMBECTOMY/INFUSION INTRACRANIAL INC DIAG ANGIO  Result Date: 11/27/2020 INDICATION: 70 year old female with past medical history significant for rheumatoid arthritis, diabetes mellitus, hypertension, anemia and melanoma  presenting with acute onset of right hemiparesis, aphasia and left gaze preference. She was found to be covered positive today. Last known well at 10:30 a.m. on 11/24/2020; modified Rankin scale 0; NIHSS 7. No IV tPA given as she was outside the window. Head CT showed an acute infarct the in the left insula and CT angiogram showed intracranial atherosclerotic disease with a left M2/MCA occlusion. EXAM: Ultrasound-guided vascular access Diagnostic cerebral angiogram Mechanical thrombectomy Flat panel head CT COMPARISON:  CT/CT angiogram of the head and neck November 24, 2020. MEDICATIONS: Refer to anesthesia documentation. ANESTHESIA/SEDATION: The procedure was performed under general anesthesia CONTRAST:  For 75 mL of Omnipaque 240 mg/mL FLUOROSCOPY TIME:  Fluoroscopy Time: 24 minutes 54 seconds (1,520 mGy). COMPLICATIONS: SIR Level A - No therapy, no consequence. TECHNIQUE: Informed written consent was obtained from the patient's son after a thorough discussion of the procedural risks, benefits and alternatives. All questions were addressed. Maximal Sterile Barrier Technique was utilized including caps, mask, sterile gowns, sterile gloves, sterile drape, hand hygiene and  skin antiseptic. A timeout was performed prior to the initiation of the procedure. The right groin was prepped and draped in the usual sterile fashion. Using a micropuncture kit and the modified Seldinger technique, access was gained to the right common femoral artery and an 8 French sheath was placed. Real-time ultrasound guidance was utilized for vascular access including the acquisition of a permanent ultrasound image documenting patency of the accessed vessel. Under fluoroscopy, a Zoom 88 guide catheter was navigated over a 6 [place] Berenstein 2 catheter and a 0.035" Terumo Glidewire into the aortic arch. The catheter was placed into the left common carotid artery and then advanced into the left internal carotid artery. The inner catheter was  removed. Frontal and lateral angiograms of the head were obtained. FINDINGS: 1. Occlusion of the mid left M2/MCA middle division branch. 2. Intracranial atherosclerotic disease with multiple luminal irregularities along the left MCA and left ACA vascular tree more significant at the A3 segment where there is moderate stenosis. 3. Patent right common femoral artery. PROCEDURE: Under biplane roadmap, a zoom 55 aspiration catheter was navigated over an Aristotle 18 microguidewire into the cavernous segment of the left ICA and then advanced to the level of occlusion. The aspiration catheter was connected to a penumbra aspiration pump. The guiding catheter balloon was inflated. The aspiration catheter were removed under constant aspiration. Follow-up left ICA angiograms showed persistent occlusion which appear to have progressed more distally. Under biplane roadmap, a zoom 55 aspiration catheter was navigated over an Aristotle 18 microguidewire into the cavernous segment of the left ICA and then advanced to the level of occlusion. The aspiration catheter was connected to a penumbra aspiration pump. The guiding catheter balloon was inflated. The aspiration catheter were removed under constant aspiration. Follow-up left ICA angiograms showed no significant change. Under biplane roadmap, a zoom 55 aspiration catheter was navigated over a phenom 21 microcatheter and a Aristotle 18 microguidewire into the cavernous segment of the right ICA. The microcatheter was then navigated over the wire into the left M3/MCA middle division branch. Then, a 4 x 40 mm solitaire stent retriever was deployed spanning the left M2 and proximal M3 segment. The device was allowed to intercalated with the clot for 4 minutes. The microcatheter was removed. The aspiration catheter was advanced to the level of occlusion and connected to a penumbra aspiration pump. The thrombectomy device and aspiration catheter were removed under constant aspiration.  Follow-up left ICA angiogram showed significant change. Under biplane roadmap, a zoom 55 aspiration catheter was navigated over a phenom 21 microcatheter and a Aristotle 18 microguidewire into the cavernous segment of the right ICA. The microcatheter was then navigated over the wire into the left M3/MCA middle division branch. Then, a 4 x 40 mm solitaire stent retriever was deployed spanning the left M2 and proximal M3 segment. The device was allowed to intercalated with the clot for 4 minutes. The microcatheter was removed. The aspiration catheter was advanced to the level of occlusion and connected to a penumbra aspiration pump. The thrombectomy device and aspiration catheter were removed under constant aspiration. Follow-up angiogram showed persistent occlusion. Flat panel CT of the head was obtained and post processed in a separate workstation with concurrent attending physician supervision. Selected images were sent to PACS. Small contrast extravasation in the left sylvian fissure noted. Follow-up angiogram showed no evidence of active contrast extravasation with persistent occlusion of a left M2 branch. Delay flat panel CT of the head was obtained and post processed in a  separate workstation with concurrent attending physician supervision. Selected images were sent to PACS. Left sylvian contrast extravasation appears stable. Delayed left ICA angiograms confirmed no evidence of contrast extravasation. The catheter was subsequently withdrawn. Left common femoral artery angiograms were obtained in frontal and lateral views. The puncture is at the level of the common femoral artery which has adequate caliber for closure device. The sheath was exchanged for a Perclose ProGlide which was utilized for access closure. Immediate hemostasis was achieved. IMPRESSION: Unsuccessful attempted recanalization of a mid left M2/MCA middle division branch complicated by small amount contrast extravasation in the left sylvian  fissure. No active extravasation seen at the end of the procedure. Left MCA status remains TICI2b, which is similar to presentation. PLAN: Patient is to remain intubated given unsuccessful recanalization and COVID positive test. She will be transferred to ICU for continued care. Electronically Signed   By: Pedro Earls M.D.   On: 11/27/2020 09:25   CT HEAD CODE STROKE WO CONTRAST  Result Date: 11/24/2020 CLINICAL DATA:  Code stroke.  Neuro deficit, acute, stroke suspected EXAM: CT HEAD WITHOUT CONTRAST TECHNIQUE: Contiguous axial images were obtained from the base of the skull through the vertex without intravenous contrast. COMPARISON:  06/27/2020. FINDINGS: Brain: No intracranial hemorrhage. New focal hypodensity involving the left insula. No mass lesion. No midline shift, ventriculomegaly or extra-axial fluid collection. Chronic microvascular ischemic changes. Vascular: No hyperdense vessel or unexpected calcification. Minimal carotid siphon atherosclerotic calcifications. Skull: Negative for fracture or focal lesion. Sinuses/Orbits: No acute finding.  Mild pansinus mucosal thickening. Other: Right parietal scalp nodule is unchanged. ASPECTS [facility] Stroke Program Early CT Score) - Ganglionic level infarction (caudate, lentiform nuclei, internal capsule, insula, M1-M3 cortex): 6 - Supraganglionic infarction (M4-M6 cortex): 3 Total score (0-10 with 10 being normal): 9 IMPRESSION: 1. 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 2. Chronic microvascular ischemic changes. 3. ASPECTS is 9 Code stroke imaging results were communicated on 11/24/2020 at 4:23 pm to provider Dr. Rory Percy via secure text paging. Electronically Signed   By: Primitivo Gauze M.D.   On: 11/24/2020 16:24   VAS [place] LOWER EXTREMITY VENOUS (DVT)  Result Date: 11/26/2020  Lower Venous DVT Study Indications: Stroke, and Covid-19.  Comparison Study: No prior study on file Performing Technologist: Sharion Dove RVS   Examination Guidelines: A complete evaluation includes B-mode imaging, spectral Doppler, color Doppler, and power Doppler as needed of all accessible portions of each vessel. Bilateral testing is considered an integral part of a complete examination. Limited examinations for reoccurring indications may be performed as noted. The reflux portion of the exam is performed with the patient in reverse Trendelenburg.  +---------+---------------+---------+-----------+----------+--------------+ RIGHT    CompressibilityPhasicitySpontaneityPropertiesThrombus Aging +---------+---------------+---------+-----------+----------+--------------+ CFV      Full           Yes      Yes                                 +---------+---------------+---------+-----------+----------+--------------+ SFJ      Full                                                        +---------+---------------+---------+-----------+----------+--------------+ FV Prox  Full                                                        +---------+---------------+---------+-----------+----------+--------------+  FV Mid   Full                                                        +---------+---------------+---------+-----------+----------+--------------+ FV DistalFull                                                        +---------+---------------+---------+-----------+----------+--------------+ PFV      Full                                                        +---------+---------------+---------+-----------+----------+--------------+ POP      Full           Yes      Yes                                 +---------+---------------+---------+-----------+----------+--------------+ PTV      Full                                                        +---------+---------------+---------+-----------+----------+--------------+ PERO     None                                         Acute           +---------+---------------+---------+-----------+----------+--------------+   +---------+---------------+---------+-----------+----------+--------------+ LEFT     CompressibilityPhasicitySpontaneityPropertiesThrombus Aging +---------+---------------+---------+-----------+----------+--------------+ CFV      Full           Yes      Yes                                 +---------+---------------+---------+-----------+----------+--------------+ SFJ      Full                                                        +---------+---------------+---------+-----------+----------+--------------+ FV Prox  Full                                                        +---------+---------------+---------+-----------+----------+--------------+ FV Mid   Full                                                        +---------+---------------+---------+-----------+----------+--------------+  FV DistalFull                                                        +---------+---------------+---------+-----------+----------+--------------+ PFV      Full                                                        +---------+---------------+---------+-----------+----------+--------------+ POP      Full           Yes      Yes                                 +---------+---------------+---------+-----------+----------+--------------+ PTV      Full                                                        +---------+---------------+---------+-----------+----------+--------------+ PERO     Full                                                        +---------+---------------+---------+-----------+----------+--------------+     Summary: RIGHT: - Findings consistent with acute deep vein thrombosis involving the right peroneal veins.  LEFT: - There is no evidence of deep vein thrombosis in the lower extremity.  *See table(s) above for measurements and observations. Electronically signed by  Servando Snare MD on 11/26/2020 at 5:59:41 PM.    Final    ECHOCARDIOGRAM LIMITED  Result Date: 11/25/2020    ECHOCARDIOGRAM REPORT   Patient Name:   [facility] Date of Exam: 11/25/2020 Medical Rec #:  [id number]        Height:       64.0 in Accession #:    [id number]       Weight:       176.4 lb Date of Birth:  [birth year]        BSA:          1.855 m Patient Age:    8 years         BP:           119/66 mmHg Patient Gender: F                HR:           77 bpm. Exam Location:  Inpatient Procedure: Limited Echo, Cardiac Doppler and Color Doppler Indications:    Stroke I63.9  History:        Patient has no prior history of Echocardiogram examinations.                 Risk Factors:Hypertension, Diabetes and Former Smoker.  Sonographer:    Vickie Epley RDCS Referring Phys: [id number] ASHISH ARORA  Sonographer Comments: Echo performed with patient supine and on artificial respirator. Covid positive. IMPRESSIONS  1.  Left ventricular ejection fraction, by estimation, is 55 to 60%. The left ventricle has normal function. The left ventricle has no regional wall motion abnormalities. Left ventricular diastolic parameters are consistent with Grade I diastolic dysfunction (impaired relaxation).  2. Right ventricular systolic function is normal. The right ventricular size is normal.  3. The mitral valve is normal in structure. Trivial mitral valve regurgitation. No evidence of mitral stenosis.  4. The aortic valve is tricuspid. Aortic valve regurgitation is trivial. Mild aortic valve sclerosis is present, with no evidence of aortic valve stenosis.  5. The inferior vena cava is normal in size with greater than 50% respiratory variability, suggesting right atrial pressure of 3 mmHg. FINDINGS  Left Ventricle: Left ventricular ejection fraction, by estimation, is 55 to 60%. The left ventricle has normal function. The left ventricle has no regional wall motion abnormalities. The left ventricular internal cavity size was normal in  size. There is  no left ventricular hypertrophy. Left ventricular diastolic parameters are consistent with Grade I diastolic dysfunction (impaired relaxation). Right Ventricle: The right ventricular size is normal.Right ventricular systolic function is normal. Left Atrium: Left atrial size was normal in size. Right Atrium: Right atrial size was normal in size. Pericardium: There is no evidence of pericardial effusion. Mitral Valve: The mitral valve is normal in structure. Trivial mitral valve regurgitation. No evidence of mitral valve stenosis. Tricuspid Valve: The tricuspid valve is normal in structure. Tricuspid valve regurgitation is trivial. No evidence of tricuspid stenosis. Aortic Valve: The aortic valve is tricuspid. Aortic valve regurgitation is trivial. Mild aortic valve sclerosis is present, with no evidence of aortic valve stenosis. Pulmonic Valve: The pulmonic valve was not well visualized. Pulmonic valve regurgitation is trivial. No evidence of pulmonic stenosis. Aorta: The aortic root is normal in size and structure. Venous: The inferior vena cava is normal in size with greater than 50% respiratory variability, suggesting right atrial pressure of 3 mmHg.  LEFT VENTRICLE PLAX 2D LVIDd:         4.10 cm     Diastology LVIDs:         3.20 cm     LV e' medial:    6.09 cm/s LV PW:         1.00 cm     LV E/e' medial:  11.1 LV IVS:        0.90 cm     LV e' lateral:   9.03 cm/s LVOT diam:     2.00 cm     LV E/e' lateral: 7.5 LV SV:         42 LV SV Index:   23 LVOT Area:     3.14 cm  LV Volumes (MOD) LV vol d, MOD A2C: 88.5 ml LV vol d, MOD A4C: 80.0 ml LV vol s, MOD A2C: 46.6 ml LV vol s, MOD A4C: 41.6 ml LV SV MOD A2C:     41.9 ml LV SV MOD A4C:     80.0 ml LV SV MOD BP:      40.6 ml RIGHT VENTRICLE RV S prime:     13.80 cm/s TAPSE (M-mode): 1.5 cm LEFT ATRIUM         Index LA diam:    2.70 cm 1.46 cm/m  AORTIC VALVE LVOT Vmax:   71.80 cm/s LVOT Vmean:  56.700 cm/s LVOT VTI:    0.134 m  AORTA Ao Root diam:  3.70 cm MITRAL VALVE MV Area (PHT): 3.85 cm    SHUNTS MV Decel Time:  197 msec    Systemic VTI:  0.13 m MV E velocity: 67.70 cm/s  Systemic Diam: 2.00 cm MV A velocity: 81.00 cm/s MV E/A ratio:  0.84 Kirk Ruths MD Electronically signed by Kirk Ruths MD Signature Date/Time: 11/25/2020/10:54:57 AM    Final    CT ANGIO HEAD CODE STROKE  Result Date: 11/24/2020 CLINICAL DATA:  Neuro deficit, acute, stroke suspected EXAM: CT ANGIOGRAPHY HEAD AND NECK TECHNIQUE: Multidetector CT imaging of the head and neck was performed using the standard protocol during bolus administration of intravenous contrast. Multiplanar CT image reconstructions and MIPs were obtained to evaluate the vascular anatomy. Carotid stenosis measurements (when applicable) are obtained utilizing NASCET criteria, using the distal internal carotid diameter as the denominator. CONTRAST:  23m OMNIPAQUE IOHEXOL 350 MG/ML SOLN COMPARISON:  None. FINDINGS: CTA NECK FINDINGS Aortic arch: Standard branching. Mild aortic arch atherosclerotic calcifications. Mild proximal left subclavian artery narrowing secondary to atheromatous plaque. Otherwise patent great vessel origins. Right carotid system: Patent. Minimal proximal ICA atherosclerotic calcifications without significant narrowing. Left carotid system: Patent. 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 Vertebral arteries: Mild left vertebral artery origin narrowing secondary to atheromatous disease. Otherwise patent and codominant. Skeleton: No acute finding.  Mild spondylosis. Other neck: No adenopathy.  No soft tissue mass. Upper chest: Patchy and nodular bilateral upper lung opacities concerning for infectious/inflammatory foci. Review of the MIP images confirms the above findings CTA HEAD FINDINGS Anterior circulation: Minimal carotid siphon atherosclerotic calcifications. Patent ICAs. Patent ophthalmic artery origins. Right A1 segment hypoplasia. Patent ACAs. Patent right  MCA. Left M2 segment occlusion. Posterior circulation: Patent V4 segments. Patent right PICA. Patent basilar and superior cerebellar arteries. Patent bilateral PCAs. Venous sinuses: As permitted by contrast timing, patent. Anatomic variants: Please see above. Review of the MIP images confirms the above findings IMPRESSION: CTA neck: 30-40% distal left CCA narrowing secondary to atheromatous plaque. Mild narrowing of the proximal left subclavian and left vertebral artery origin. Bilateral upper lung nodular/patchy opacities concerning for infectious/inflammatory foci. CTA head: Left M2 branch occlusion. No aneurysm or dissection. Code stroke imaging results were communicated on 11/24/2020 at 4:41 pm to provider Dr. ARory Percyvia secure text paging. Electronically Signed   By: CPrimitivo GauzeM.D.   On: 11/24/2020 16:41   CT ANGIO NECK CODE STROKE  Result Date: 11/24/2020 CLINICAL DATA:  Neuro deficit, acute, stroke suspected EXAM: CT ANGIOGRAPHY HEAD AND NECK TECHNIQUE: Multidetector CT imaging of the head and neck was performed using the standard protocol during bolus administration of intravenous contrast. Multiplanar CT image reconstructions and MIPs were obtained to evaluate the vascular anatomy. Carotid stenosis measurements (when applicable) are obtained utilizing NASCET criteria, using the distal internal carotid diameter as the denominator. CONTRAST:  620mOMNIPAQUE IOHEXOL 350 MG/ML SOLN COMPARISON:  None. FINDINGS: CTA NECK FINDINGS Aortic arch: Standard branching. Mild aortic arch atherosclerotic calcifications. Mild proximal left subclavian artery narrowing secondary to atheromatous plaque. Otherwise patent great vessel origins. Right carotid system: Patent. Minimal proximal ICA atherosclerotic calcifications without significant narrowing. Left carotid system: Patent. 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 Vertebral arteries: Mild left vertebral artery origin narrowing  secondary to atheromatous disease. Otherwise patent and codominant. Skeleton: No acute finding.  Mild spondylosis. Other neck: No adenopathy.  No soft tissue mass. Upper chest: Patchy and nodular bilateral upper lung opacities concerning for infectious/inflammatory foci. Review of the MIP images confirms the above findings CTA HEAD FINDINGS Anterior circulation: Minimal carotid siphon atherosclerotic calcifications. Patent ICAs. Patent ophthalmic  artery origins. Right A1 segment hypoplasia. Patent ACAs. Patent right MCA. Left M2 segment occlusion. Posterior circulation: Patent V4 segments. Patent right PICA. Patent basilar and superior cerebellar arteries. Patent bilateral PCAs. Venous sinuses: As permitted by contrast timing, patent. Anatomic variants: Please see above. Review of the MIP images confirms the above findings IMPRESSION: CTA neck: 30-40% distal left CCA narrowing secondary to atheromatous plaque. Mild narrowing of the proximal left subclavian and left vertebral artery origin. Bilateral upper lung nodular/patchy opacities concerning for infectious/inflammatory foci. CTA head: Left M2 branch occlusion. No aneurysm or dissection. Code stroke imaging results were communicated on 11/24/2020 at 4:41 pm to provider Dr. Rory Percy via secure text paging. Electronically Signed   By: Primitivo Gauze M.D.   On: 11/24/2020 16:41

## 2020-12-10 DIAGNOSIS — I639 Cerebral infarction, unspecified: Secondary | ICD-10-CM | POA: Diagnosis not present

## 2020-12-10 LAB — GLUCOSE, CAPILLARY
Glucose-Capillary: 173 mg/dL — ABNORMAL HIGH (ref 70–99)
Glucose-Capillary: 179 mg/dL — ABNORMAL HIGH (ref 70–99)

## 2020-12-10 MED ORDER — RESOURCE THICKENUP CLEAR PO POWD: ORAL | Status: DC

## 2020-12-10 MED ORDER — INSULIN DETEMIR 100 UNIT/ML ~~LOC~~ SOLN: 14.0000 [IU] | Freq: Every day | SUBCUTANEOUS | 11 refills | Status: DC

## 2020-12-10 MED ORDER — INSULIN ASPART 100 UNIT/ML ~~LOC~~ SOLN: 0.0000 [IU] | Freq: Three times a day (TID) | SUBCUTANEOUS | 11 refills | Status: DC

## 2020-12-10 MED ORDER — MOMETASONE FURO-FORMOTEROL FUM 100-5 MCG/ACT IN AERO: 2.0000 | INHALATION_SPRAY | Freq: Two times a day (BID) | RESPIRATORY_TRACT | Status: DC

## 2020-12-10 MED ORDER — ENSURE ENLIVE PO LIQD: 237.0000 mL | Freq: Three times a day (TID) | ORAL | 12 refills | Status: DC

## 2020-12-10 MED ORDER — METOPROLOL TARTRATE 50 MG PO TABS: 50.0000 mg | ORAL_TABLET | Freq: Two times a day (BID) | ORAL | Status: DC

## 2020-12-10 MED ORDER — APIXABAN 5 MG PO TABS: 5.0000 mg | ORAL_TABLET | Freq: Two times a day (BID) | ORAL | Status: DC

## 2020-12-10 NOTE — Progress Notes (Signed)
Emily Lozano to be D/C'd Skilled nursing facility per MD order.  Discussed with the patient and all questions fully answered.  VSS, Skin clean, dry and intact without evidence of skin break down, no evidence of skin tears noted. IV catheter discontinued intact. Site without signs and symptoms of complications. Dressing and pressure applied.  An After Visit Summary was printed and given to the patient.   D/c education completed with patient/family including follow up instructions, medication list, d/c activities limitations if indicated, with other d/c instructions as indicated by MD - patient able to verbalize understanding, all questions fully answered.   Patient instructed to return to ED, call 911, or call MD for any changes in condition.   Patient escorted via stretcher, and D/C to SNF via [facility].  Kallie Depolo T Bassem Bernasconi 12/10/2020 1:15 PM

## 2020-12-10 NOTE — Progress Notes (Signed)
Occupational Therapy Treatment Patient Details Name: Emily Lozano MRN: [id number] DOB: [birth year] Today's Date: 12/10/2020    History of present illness 70 y.o. female presents with acute onset R weakness, aphasia, left gaze. CTA demonstrates L M2 occlusion. Pt admitted and underwent thrombectomy on 11/24/2020 (unsuccessful). PMH: significant for hypertension, hyperlipidemia, type 2 diabetes mellitus, depression, GERD, melanoma, sleep apnea, and recent exposure to COVID-19 with a negative test at her PCP yesterday but diagnosed with acute bronchitis.   OT comments  Pt progressing towards OT goals at this time. Pt KT tape is still in place to address subluxation, Pt mod A +2 for bed mobility, able to perform sit<>stand x3 with mod A+2 assist in stedy. Attempted to use yellow speech communication sheet without success. Pt visibly frustrated with impaired communication, but following directions with 80% accuracy today (simple one step) R pain with weight bearing today. TOtal A +2 to return supine. Pt with planned dc later today, POC remains appropriate.    Follow Up Recommendations  Supervision/Assistance - 24 hour;CIR    Equipment Recommendations  3 in 1 bedside commode;Wheelchair (measurements OT);Wheelchair cushion (measurements OT)    Recommendations for Other Services      Precautions / Restrictions Precautions Precautions: Fall Precaution Comments: COVID postive Restrictions Weight Bearing Restrictions: No       Mobility Bed Mobility Overal bed mobility: Needs Assistance Bed Mobility: Supine to Sit     Supine to sit: +2 for safety/equipment;HOB elevated;Mod assist Sit to supine: Total assist   General bed mobility comments: min A for bringing L LE to EoB, with assistance able to reach L UE to bed rail to help in pulling to upright, requires modAx2 for brining trunk to upright and for pad scooting hips to square to EoB, total Ax2 for return to bed given fatigue     Transfers Overall transfer level: Needs assistance Equipment used: 2 person hand held assist Transfers: Sit to/from Stand;Stand Pivot Transfers Sit to Stand: +2 physical assistance;+2 safety/equipment;Min assist;Mod assist         General transfer comment: minAx2 for 1st x sit>stand from bed with stedy, pt with good understanding of the task at hand and able to pull up with Stedy, stood for ~30 sec then with increased pain in R side, possibly knee with weightbearing, attempted to block knee with pillow on second attept to stand, however did not help, ultimate location of pain unknown, stood for 3 time to use Stedy to move hips towards HoB    Balance Overall balance assessment: Needs assistance Sitting-balance support: Feet supported;Single extremity supported;No upper extremity supported Sitting balance-Leahy Scale: Poor Sitting balance - Comments: Min A for support with R lateral lean   Standing balance support: Bilateral upper extremity supported;During functional activity Standing balance-Leahy Scale: Poor Standing balance comment: Reliant on physical A and blocking of BLEs.                           ADL either performed or assessed with clinical judgement   ADL Overall ADL's : Needs assistance/impaired Eating/Feeding: Moderate assistance;Bed level (HOB elevated) Eating/Feeding Details (indicate cue type and reason): taking sips of honey thick apple juice Grooming: Sitting;Wash/dry face;Moderate assistance Grooming Details (indicate cue type and reason): mod A to initiate task, then Pt able to complete with LUE                 Toilet Transfer: Moderate assistance;+2 for safety/equipment Toilet Transfer Details (indicate cue type and  reason): stedy           General ADL Comments: Pt verbalizing - but unable to utilize yellow speech sheet     Vision       Perception     Praxis      Cognition Arousal/Alertness: Awake/alert Behavior During  Therapy: Flat affect Overall Cognitive Status: Difficult to assess Area of Impairment: Attention;Following commands;Problem solving;Safety/judgement                   Current Attention Level: Selective   Following Commands: Follows one step commands inconsistently;Follows one step commands with increased time Safety/Judgement: Decreased awareness of safety Awareness: Intellectual Problem Solving: Slow processing;Requires verbal cues;Requires tactile cues General Comments: follows simple commands, is very frustrated today given increased aphasia,        Exercises     Shoulder Instructions       General Comments VSS on RA, KT taping of R shoulder for subluxation, in place and still well adhered    Pertinent Vitals/ Pain       Pain Assessment: Faces Faces Pain Scale: Hurts even more Pain Location: R side/knee with weightbearing Pain Descriptors / Indicators: Grimacing;Moaning Pain Intervention(s): Limited activity within patient's tolerance;Monitored during session;Repositioned  Home Living                                          Prior Functioning/Environment              Frequency  Min 2X/week        Progress Toward Goals  OT Goals(current goals can now be found in the care plan section)  Progress towards OT goals: Progressing toward goals  Acute Rehab OT Goals Patient Stated Goal: Pt unable to state OT Goal Formulation: Patient unable to participate in goal setting Time For Goal Achievement: 12/10/20 Potential to Achieve Goals: Good  Plan Discharge plan remains appropriate    Co-evaluation    PT/OT/SLP Co-Evaluation/Treatment: Yes Reason for Co-Treatment: For patient/therapist safety PT goals addressed during session: Mobility/safety with mobility;Balance;Proper use of DME OT goals addressed during session: ADL's and self-care;Strengthening/ROM      AM-PAC OT "6 Clicks" Daily Activity     Outcome Measure   Help from  another person eating meals?: A Lot Help from another person taking care of personal grooming?: A Lot Help from another person toileting, which includes using toliet, bedpan, or urinal?: Total Help from another person bathing (including washing, rinsing, drying)?: A Lot Help from another person to put on and taking off regular upper body clothing?: A Lot Help from another person to put on and taking off regular lower body clothing?: A Lot 6 Click Score: 11    End of Session Equipment Utilized During Treatment: Gait belt Charlaine Dalton)  OT Visit Diagnosis: Unsteadiness on feet (R26.81);Other abnormalities of gait and mobility (R26.89);Muscle weakness (generalized) (M62.81);Pain;Hemiplegia and hemiparesis Hemiplegia - Right/Left: Right Hemiplegia - dominant/non-dominant: Dominant Hemiplegia - caused by: Cerebral infarction Pain - Right/Left: Right Pain - part of body: Arm;Leg   Activity Tolerance Patient tolerated treatment well   Patient Left in bed;with call bell/phone within reach;with bed alarm set;with nursing/sitter in room   Nurse Communication Mobility status        Time: 1601-0932 OT Time Calculation (min): 28 min  Charges: OT General Charges $OT Visit: 1 Visit OT Treatments $Self Care/Home Management : 8-22 mins  Mickel Baas H OTR/L Acute  Rehabilitation Services Pager: [phone] Office: [place] 12/10/2020, 3:15 PM

## 2020-12-10 NOTE — Progress Notes (Signed)
Physical Therapy Treatment Patient Details Name: Emily Lozano MRN: [id number] DOB: 1950-11-09 Today's Date: 12/10/2020    History of Present Illness 70 y.o. female presents with acute onset R weakness, aphasia, left gaze. CTA demonstrates L M2 occlusion. Pt admitted and underwent thrombectomy on 11/24/2020 (unsuccessful). PMH: significant for hypertension, hyperlipidemia, type 2 diabetes mellitus, depression, GERD, melanoma, sleep apnea, and recent exposure to COVID-19 with a negative test at her PCP yesterday but diagnosed with acute bronchitis.    PT Comments    Pt with increased alertness today both eyes open and better awareness of her surroundings. However, with increased alertness pt also has increased frustration with aphasia and inability to communicate needs.  Pt able to follow single step commands, and has increased problem solving. Pt to discharge to SNF later this morning, so focus of session was to improve standing and balance with Stedy. Pt with immediate understanding of how Stedy works and initial power up with minA, but then limited by R sided pain. Therapists unable to determine location of pain adding to pt frustration. Pt able to stand a total of 3x in [place], before requiring total A to return to bed due to fatigue. Pt is making good progress and will benefit from continued PT/OT and SLP therapy to maximize recovery.     Follow Up Recommendations  Supervision/Assistance - 24 hour;SNF     Equipment Recommendations  Other (comment)    Recommendations for Other Services       Precautions / Restrictions Precautions Precautions: Fall Precaution Comments: COVID postive Restrictions Weight Bearing Restrictions: No    Mobility  Bed Mobility Overal bed mobility: Needs Assistance Bed Mobility: Supine to Sit     Supine to sit: +2 for safety/equipment;HOB elevated;Mod assist Sit to supine: Total assist   General bed mobility comments: min A for bringing L LE to EoB,  with assistance able to reach L UE to bed rail to help in pulling to upright, requires modAx2 for brining trunk to upright and for pad scooting hips to square to EoB, total Ax2 for return to bed given fatigue    Transfers Overall transfer level: Needs assistance Equipment used: 2 person hand held assist Transfers: Sit to/from Stand;Stand Pivot Transfers Sit to Stand: +2 physical assistance;+2 safety/equipment;Min assist;Mod assist         General transfer comment: minAx2 for 1st x sit>stand from bed with stedy, pt with good understanding of the task at hand and able to pull up with Stedy, stood for ~30 sec then with increased pain in R side, possibly knee with weightbearing, attempted to block knee with pillow on second attept to stand, however did not help, ultimate location of pain unknown, stood for 3 time to use Stedy to move hips towards HoB  Ambulation/Gait             General Gait Details: not able   Modified Rankin (Stroke Patients Only) Modified Rankin (Stroke Patients Only) Pre-Morbid Rankin Score: No symptoms Modified Rankin: Severe disability     Balance Overall balance assessment: Needs assistance Sitting-balance support: Feet supported;Single extremity supported;No upper extremity supported Sitting balance-Leahy Scale: Poor Sitting balance - Comments: Min A for support with R lateral lean   Standing balance support: Bilateral upper extremity supported;During functional activity Standing balance-Leahy Scale: Poor Standing balance comment: Reliant on physical A and blocking of BLEs.  Cognition Arousal/Alertness: Awake/alert Behavior During Therapy: Flat affect Overall Cognitive Status: Difficult to assess Area of Impairment: Attention;Following commands;Problem solving;Safety/judgement                   Current Attention Level: Selective   Following Commands: Follows one step commands inconsistently;Follows one  step commands with increased time Safety/Judgement: Decreased awareness of safety Awareness: Intellectual Problem Solving: Slow processing;Requires verbal cues;Requires tactile cues General Comments: follows simple commands, is very frustrated today given increased aphasia,      Exercises      General Comments General comments (skin integrity, edema, etc.): VSS on RA, KT taping of R shoulder for subluxation, in place and still well adhered      Pertinent Vitals/Pain Pain Assessment: Faces Faces Pain Scale: Hurts even more Pain Location: R side/knee with weightbearing Pain Descriptors / Indicators: Grimacing;Moaning Pain Intervention(s): Limited activity within patient's tolerance;Monitored during session;Repositioned           PT Goals (current goals can now be found in the care plan section) Acute Rehab PT Goals Patient Stated Goal: Pt unstable to state PT Goal Formulation: Patient unable to participate in goal setting Time For Goal Achievement: 12/10/20 Potential to Achieve Goals: Fair Progress towards PT goals: Progressing toward goals    Frequency    Min 3X/week      PT Plan Current plan remains appropriate    Co-evaluation PT/OT/SLP Co-Evaluation/Treatment: Yes Reason for Co-Treatment: For patient/therapist safety PT goals addressed during session: Mobility/safety with mobility OT goals addressed during session: ADL's and self-care      AM-PAC PT "6 Clicks" Mobility   Outcome Measure  Help needed turning from your back to your side while in a flat bed without using bedrails?: A Lot Help needed moving from lying on your back to sitting on the side of a flat bed without using bedrails?: A Lot Help needed moving to and from a bed to a chair (including a wheelchair)?: A Lot Help needed standing up from a chair using your arms (e.g., wheelchair or bedside chair)?: A Lot Help needed to walk in hospital room?: Total Help needed climbing 3-5 steps with a  railing? : Total 6 Click Score: 10    End of Session Equipment Utilized During Treatment: Gait belt Activity Tolerance: Patient tolerated treatment well Patient left: in bed;with bed alarm set;with call bell/phone within reach Nurse Communication: Mobility status PT Visit Diagnosis: Hemiplegia and hemiparesis;Other abnormalities of gait and mobility (R26.89) Hemiplegia - Right/Left: Right Hemiplegia - dominant/non-dominant: Dominant Hemiplegia - caused by: Cerebral infarction     Time: 5621-3086 PT Time Calculation (min) (ACUTE ONLY): 28 min  Charges:  $Therapeutic Activity: 8-22 mins                     Rilie Glanz B. Migdalia Dk PT, DPT Acute Rehabilitation Services Pager [phone] Office [phone]    [place] 12/10/2020, 1:42 PM

## 2020-12-10 NOTE — TOC Transition Note (Signed)
Transition of Care [facility]) - CM/SW Discharge Note   Patient Details  Name: YELINA SARRATT MRN: [id number] Date of Birth: 07-11-1951  Transition of Care [facility]) CM/SW Contact:  Benard Halsted, LCSW Phone Number: 12/10/2020, 11:05 AM   Clinical Narrative:    Patient will DC to: [facility] [place] Anticipated DC date: 12/10/20 Family notified: Son, Erlene Quan Transport by: Corey Harold   Per MD patient ready for DC to [place]. RN to call report prior to discharge [phone]). RN, patient, patient's family, and facility notified of DC. Discharge Summary and FL2 sent to facility. DC packet on chart. Ambulance transport requested for patient.   CSW will sign off for now as social work intervention is no longer needed. Please consult [place] again if new needs arise.      Final next level of care: Skilled Nursing Facility Barriers to Discharge: Barriers Resolved   Patient Goals and CMS Choice Patient states their goals for this hospitalization and ongoing recovery are:: rehab CMS Medicare.gov Compare Post Acute Care list provided to:: Patient Represenative (must comment) Choice offered to / list presented to : Adult Children  Discharge Placement   Existing PASRR number confirmed : 12/10/20          Patient chooses bed at: [facility] Patient to be transferred to facility by: [place] Name of family member notified: Erlene Quan, son Patient and family notified of of transfer: 12/10/20  Discharge Plan and Services In-house Referral: Clinical Social Work   Post Acute Care Choice: [place]                               Social Determinants of Health (SDOH) Interventions     Readmission Risk Interventions No flowsheet data found.

## 2020-12-10 NOTE — Care Management Important Message (Signed)
Important Message  Patient Details  Name: Emily Lozano MRN: [id number] Date of Birth: 1950/09/30   Medicare Important Message Given:  Yes - Important Message mailed due to current National Emergency   Verbal consent obtained due to current National Emergency  Relationship to patient: Self Contact Name: Shalimar Call Date: 12/10/20  Time: 1507 Phone: [phone] Outcome: No Answer/Busy Important Message mailed to: Patient address on file    Delorse Lek 12/10/2020, 3:07 PM

## 2020-12-10 NOTE — Progress Notes (Signed)
Inpatient Rehab Admissions Coordinator:   Note plans to d/c to [facility] today.  Will sign off for CIR.   Shann Medal, PT, DPT Admissions Coordinator [phone] 12/10/20  12:39 PM

## 2020-12-10 NOTE — Discharge Summary (Signed)
Emily Lozano, is a 70 y.o. female  DOB [birth year]  MRN [id number].  Admission date:  11/24/2020  Admitting Physician  Amie Portland, MD  Discharge Date:  12/10/2020   Primary MD  Marinda Elk, MD  Recommendations for primary care physician for things to follow:  -Patient on dysphagia 2, nectar thick, will need close follow-up with SLP at facility, she is total assist with feet, please feed with full aspiration precaution, sitting up at 90 degrees. -Follow with IR and neurology as an outpatient.   Admission Diagnosis  Stroke [facility]) [I63.9] Acute ischemic stroke ([place]) [I63.9] Acute CVA (cerebrovascular accident) ([place]) [I63.9] Pneumonia due to COVID-19 virus [U07.1, J12.82]   Discharge Diagnosis  Stroke [facility]) [I63.9] Acute ischemic stroke ([place]) [I63.9] Acute CVA (cerebrovascular accident) ([place]) [I63.9] Pneumonia due to COVID-19 virus [U07.1, J12.82]    Active Problems:   Acute ischemic stroke ([place])   Pneumonia due to COVID-19 virus      Past Medical History:  Diagnosis Date  . Anemia   . Arthritis   . 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   . Depression   . Diabetes mellitus without complication ([place])   . Hypertension   . Melanoma ([place]) 2014   resected from Left forearm.     Past Surgical History:  Procedure Laterality Date  . ABDOMINAL HYSTERECTOMY    . BREAST LUMPECTOMY    . CARPAL TUNNEL RELEASE    . ESOPHAGEAL MANOMETRY N/A 12/05/2015   Procedure: ESOPHAGEAL MANOMETRY (EM);  Surgeon: Josefine Class, MD;  Location: [facility] ENDOSCOPY;  Service: Endoscopy;  Laterality: N/A;  . ESOPHAGOGASTRODUODENOSCOPY (EGD) WITH PROPOFOL N/A 11/26/2015   Procedure: ESOPHAGOGASTRODUODENOSCOPY (EGD) WITH PROPOFOL;  Surgeon: Lollie Sails, MD;  Location: [facility] ENDOSCOPY;  Service: Endoscopy;  Laterality: N/A;  . ESOPHAGOGASTRODUODENOSCOPY (EGD) WITH PROPOFOL N/A 04/30/2017    Procedure: ESOPHAGOGASTRODUODENOSCOPY (EGD) WITH PROPOFOL;  Surgeon: Lollie Sails, MD;  Location: [facility] ENDOSCOPY;  Service: Endoscopy;  Laterality: N/A;  . EXCISION MELANOMA WITH SENTINEL LYMPH NODE BIOPSY    . IR CT HEAD LTD  11/24/2020  . IR CT HEAD LTD  11/24/2020  . IR PERCUTANEOUS ART THROMBECTOMY/INFUSION INTRACRANIAL INC DIAG ANGIO  11/24/2020  . IR US GUIDE VASC ACCESS RIGHT  11/24/2020  . RADIOLOGY WITH ANESTHESIA N/A 11/24/2020   Procedure: IR WITH ANESTHESIA;  Surgeon: Radiologist, Medication, MD;  Location: [place];  Service: Radiology;  Laterality: N/A;       History of present illness and  Hospital Course:     Kindly see H&P for history of present illness and admission details, please review complete Labs, Consult reports and Test reports for all details in brief  HPI  from the history and physical done on the day of admission 11/24/2020   HPI: Emily Lozano is a 70 y.o. female with a medical history significant for hypertension, hyperlipidemia, type 2 diabetes mellitus, depression, GERD, melanoma, sleep apnea, and recent exposure to COVID-19 with a negative test at her PCP yesterday but diagnosed with  acute bronchitis and initiated on prednisone and a z-pack. She presents to the ED today from home due to acute onset of right hemiparesis, aphasia, and with a left gaze preference. Per her son, he was with her this morning and went to get something and when he returned around 10:30 she was slumped over with decreased responsiveness. EMS was subsequently activated after some time and she was brought to [facility] ED as a stroke alert.   At baseline, she is independent and able to care for herself. She was seen recently 11/05/20 by neurology outpatient for a single episode of festinating gait without observed gait disturbance. CT head was obtained at that time revealed mild chronic ischemic white matter disease without acute intracranial abnormality. She takes aspirin daily at  home.   Hospital Course    Left M2 territory infarct with M2 occlusion causing dense right-sided hemiparesis, expressive aphasia.  In a patient with underlying A. fib, hypertension, dyslipidemia and DM type II. - She has been under neuro service, has had thorough stroke work-up, underwent unsuccessful revascularization attempt by IR. -Unfortunately she is with residual  dense right-sided weakness and expressive aphasia along with some dysphagia,seen  by PT/OT, will need  SNF placement.  . -Used to be on aspirin before admission, she is currently on Eliquis for anticoagulation.   - she remains with dysphagia, followed closely by SLP, she is on dysphagia 2 diet with nectar thick liquid.her oral intake has improved, will need close follow-up by SLP at the facility and to advance her diet as tolerated.  Dyslipidemia.  - LDL was 70 on rosuvastatin 10 mg.  Continue  Paroxysmal A. fib RVR.  - [place] vas 2 score of greater than 5, continue beta-blocker along  Eliquis.  Echo shows a EF of 55 to 60% no acute findings  Essential hypertension.   -Blood pressure currently controlled on Norvasc and metoprolol .  Right lower extremity DVT.   - Continue Eliquis.  History of smoking.   COVID-19 pneumonia with acute hypoxic respiratory failure.  Required intubation and ICU stay, has completed Remdesivir course, tapering down Decadron.  She had some post intubation laryngeal edema and stridor which is much improved, currently on room air. -She is currently back on room air with no oxygen requirement. -Needs 21 days of isolation.  DM type II.  On Levemir along with sliding scale .  Discharge Condition:  Stable   Follow UP   Follow-up Information    de Rosario Jacks, MD Follow up in 1 month(s).   Specialties: Radiology, Interventional Radiology Why: Please follow-up with Dr. Karenann Cai in clinic 1 month after discharge. Our office will call you to set up this  appointment. Contact information: [place] [address] [phone]        [facility]. Schedule an appointment as soon as possible for a visit in 4 week(s).   Specialty: Neurology Contact information: [address] [place] [phone]                Discharge Instructions  and  Discharge Medications   Discharge Instructions    Ambulatory referral to Neurology   Complete by: As directed    Follow up with stroke clinic NP (Jessica Vanschaick or Cecille Rubin, if both not available, consider Zachery Dauer, or A[place]) at [facility] in about 4 weeks. Thanks.   Discharge instructions   Complete by: As directed    Follow with Primary  MD Marinda Elk, MD or SNF physician  Get CBC, CMP.    Activity: As tolerated with Full fall precautions use walker/cane & assistance as needed   Disposition SNF   Diet: Dysphagia 2 with nectar thick, with feeding assistance and aspiration precautions.  Only in sitting up position with full assistance.    On your next visit with your primary care physician please Get Medicines reviewed and adjusted.   Please request your Prim.MD to go over all Hospital Tests and Procedure/Radiological results at the follow up, please get all Hospital records sent to your Prim MD by signing hospital release before you go home.   If you experience worsening of your admission symptoms, develop shortness of breath, life threatening emergency, suicidal or homicidal thoughts you must seek medical attention immediately by calling 911 or calling your MD immediately  if symptoms less severe.  You Must read complete instructions/literature along with all the possible adverse reactions/side effects for all the Medicines you take and that have been prescribed to you. Take any new Medicines after you have completely understood and accpet all the possible adverse reactions/side effects.    Do not drive, operating heavy machinery, perform activities at heights, swimming or participation in water activities or provide baby sitting services if your were admitted for syncope or siezures until you have seen by Primary MD or a Neurologist and advised to do so again.  Do not drive when taking Pain medications.    Do not take more than prescribed Pain, Sleep and Anxiety Medications  Special Instructions: If you have smoked or chewed Tobacco  in the last 2 yrs please stop smoking, stop any regular Alcohol  and or any Recreational drug use.  Wear Seat belts while driving.   Please note  You were cared for by a hospitalist during your hospital stay. If you have any questions about your discharge medications or the care you received while you were in the hospital after you are discharged, you can call the unit and asked to speak with the hospitalist on call if the hospitalist that took care of you is not available. Once you are discharged, your primary care physician will handle any further medical issues. Please note that NO REFILLS for any discharge medications will be authorized once you are discharged, as it is imperative that you return to your primary care physician (or establish a relationship with a primary care physician if you do not have one) for your aftercare needs so that they can reassess your need for medications and monitor your lab values.   Increase activity slowly   Complete by: As directed    No wound care   Complete by: As directed      Allergies as of 12/10/2020      Reactions   Ativan [lorazepam]    Morphine And Related Nausea And Vomiting      Medication List    STOP taking these medications   amLODipine 5 MG tablet Commonly known as: NORVASC   aspirin EC 81 MG tablet   azithromycin 250 MG tablet Commonly known as: ZITHROMAX   benzonatate 200 MG capsule Commonly known as: TESSALON   buPROPion 300 MG 24 hr tablet Commonly known as: WELLBUTRIN  XL   CORICIDIN HBP PO   dicyclomine 10 MG capsule Commonly known as: BENTYL   glimepiride 4 MG tablet Commonly known as: AMARYL   hydroxychloroquine 200 MG tablet Commonly known as: PLAQUENIL   losartan-hydrochlorothiazide 100-12.5 MG tablet Commonly known as:  HYZAAR   metFORMIN 500 MG tablet Commonly known as: GLUCOPHAGE   metoprolol succinate 100 MG 24 hr tablet Commonly known as: TOPROL-XL   naproxen sodium 220 MG tablet Commonly known as: ALEVE   predniSONE 20 MG tablet Commonly known as: DELTASONE   Robitussin Cough+Chest Cong DM 20-200 MG/20ML Liqd Generic drug: Dextromethorphan-guaiFENesin   vitamin C 1000 MG tablet   Zinc 50 MG Tabs     TAKE these medications   apixaban 5 MG Tabs tablet Commonly known as: ELIQUIS Take 1 tablet (5 mg total) by mouth 2 (two) times daily.   feeding supplement Liqd Take 237 mLs by mouth 3 (three) times daily between meals.   insulin aspart 100 UNIT/ML injection Commonly known as: novoLOG Inject 0-15 Units into the skin 3 (three) times daily with meals.   insulin detemir 100 UNIT/ML injection Commonly known as: LEVEMIR Inject 0.14 mLs (14 Units total) into the skin daily. Start taking on: December 11, 2020   metoprolol tartrate 50 MG tablet Commonly known as: LOPRESSOR Take 1 tablet (50 mg total) by mouth 2 (two) times daily.   mometasone-formoterol 100-5 MCG/ACT Aero Commonly known as: DULERA Inhale 2 puffs into the lungs 2 (two) times daily.   pantoprazole 40 MG tablet Commonly known as: PROTONIX Take 40 mg by mouth 2 (two) times daily.   Resource ThickenUp Clear Powd nectar thick   rosuvastatin 10 MG tablet Commonly known as: CRESTOR Take 10 mg by mouth daily.         Diet and Activity recommendation: See Discharge Instructions above   Consults obtained -   Neuro PCCM   Major procedures and Radiology Reports - PLEASE review detailed and final reports for all details, in brief -  Intubated and  extubated in ICU.  Unsuccessful attempt at left M2 occlusion removal by IR   CT HEAD WO CONTRAST  Result Date: 12/06/2020 CLINICAL DATA:  Stroke follow-up EXAM: CT HEAD WITHOUT CONTRAST TECHNIQUE: Contiguous axial images were obtained from the base of the skull through the vertex without intravenous contrast. COMPARISON:  12/02/2020 FINDINGS: Brain: Expected evolution of left MCA territory infarct with decreased amount of cytotoxic edema. No hemorrhage. There is no midline shift or other mass effect. Severe chronic white matter disease is unchanged. No hydrocephalus. Vascular: No hyperdense vessel or unexpected calcification. Skull: Normal. Negative for fracture or focal lesion. Sinuses/Orbits: No acute finding. Other: None. IMPRESSION: Decreased edema at site of left MCA territory infarct without hemorrhage or mass effect. Electronically Signed   By: Ulyses Jarred M.D.   On: 12/06/2020 00:28   CT HEAD WO CONTRAST  Result Date: 12/02/2020 CLINICAL DATA:  Stroke follow-up EXAM: CT HEAD WITHOUT CONTRAST TECHNIQUE: Contiguous axial images were obtained from the base of the skull through the vertex without intravenous contrast. COMPARISON:  Brain MRI 11/25/2020 FINDINGS: Brain: Large superior division left MCA territory infarct matching the area of diffusion abnormality on prior. Mild petechial hemorrhage is present and non progressed from MRI. No midline shift. Background of chronic small vessel ischemia in the cerebral white matter. No hydrocephalus or masslike finding. Vascular: Unchanged Skull: Normal. Negative for fracture or focal lesion. Sinuses/Orbits: Negative IMPRESSION: 1. Large recent left MCA territory infarct with mild petechial hemorrhage. No progression since brain MRI 11/25/2020. 2. Extensive chronic small vessel ischemia. Electronically Signed   By: Monte Fantasia M.D.   On: 12/02/2020 11:14   MR BRAIN WO CONTRAST  Result Date: 11/25/2020 CLINICAL DATA:  Acute stroke presentation  11/24/2020. Left insular infarction.  EXAM: MRI HEAD WITHOUT CONTRAST TECHNIQUE: Multiplanar, multiecho pulse sequences of the brain and surrounding structures were obtained without intravenous contrast. COMPARISON:  CT and interventional studies over the last day. FINDINGS: Brain: Diffusion imaging shows acute infarction in the deep insula and left frontoparietal cortical and subcortical brain. Mild swelling in the region. There are either minimal petechial blood products or there is susceptibility artifact related to thrombosed vessels. No frank hematoma. No mass effect or shift. Elsewhere, the brainstem and cerebellum are unremarkable. Both cerebral hemispheres show moderate to severe chronic small-vessel ischemic changes throughout the white matter. No hydrocephalus. No extra-axial collection. Vascular: Major vessels at base of the brain show flow. Skull and upper cervical spine: Negative Sinuses/Orbits: Mucosal inflammatory changes of paranasal sinuses. Orbits negative. Other: None IMPRESSION: 1. Acute infarction in the left MCA territory affecting the deep insula and left frontoparietal cortical and subcortical brain. Minimal petechial blood products or susceptibility artifact related to thrombosed vessels. No frank hematoma. No mass effect or shift. 2. Moderate to severe chronic small-vessel ischemic changes elsewhere throughout the cerebral hemispheric white matter. Electronically Signed   By: Nelson Chimes M.D.   On: 11/25/2020 02:18   IR CT Head Ltd  Result Date: 11/27/2020 INDICATION: 70 year old female with past medical history significant for rheumatoid arthritis, diabetes mellitus, hypertension, anemia and melanoma presenting with acute onset of right hemiparesis, aphasia and left gaze preference. She was found to be covered positive today. Last known well at 10:30 a.m. on 11/24/2020; modified Rankin scale 0; NIHSS 7. No IV tPA given as she was outside the window. Head CT showed an acute infarct the  in the left insula and CT angiogram showed intracranial atherosclerotic disease with a left M2/MCA occlusion. EXAM: Ultrasound-guided vascular access Diagnostic cerebral angiogram Mechanical thrombectomy Flat panel head CT COMPARISON:  CT/CT angiogram of the head and neck November 24, 2020. MEDICATIONS: Refer to anesthesia documentation. ANESTHESIA/SEDATION: The procedure was performed under general anesthesia CONTRAST:  For 75 mL of Omnipaque 240 mg/mL FLUOROSCOPY TIME:  Fluoroscopy Time: 24 minutes 54 seconds (1,520 mGy). COMPLICATIONS: SIR Level A - No therapy, no consequence. TECHNIQUE: Informed written consent was obtained from the patient's son after a thorough discussion of the procedural risks, benefits and alternatives. All questions were addressed. Maximal Sterile Barrier Technique was utilized including caps, mask, sterile gowns, sterile gloves, sterile drape, hand hygiene and skin antiseptic. A timeout was performed prior to the initiation of the procedure. The right groin was prepped and draped in the usual sterile fashion. Using a micropuncture kit and the modified Seldinger technique, access was gained to the right common femoral artery and an 8 French sheath was placed. Real-time ultrasound guidance was utilized for vascular access including the acquisition of a permanent ultrasound image documenting patency of the accessed vessel. Under fluoroscopy, a Zoom 88 guide catheter was navigated over a 6 [place] Berenstein 2 catheter and a 0.035" Terumo Glidewire into the aortic arch. The catheter was placed into the left common carotid artery and then advanced into the left internal carotid artery. The inner catheter was removed. Frontal and lateral angiograms of the head were obtained. FINDINGS: 1. Occlusion of the mid left M2/MCA middle division branch. 2. Intracranial atherosclerotic disease with multiple luminal irregularities along the left MCA and left ACA vascular tree more significant at the A3  segment where there is moderate stenosis. 3. Patent right common femoral artery. PROCEDURE: Under biplane roadmap, a zoom 55 aspiration catheter was navigated over an Aristotle 18 microguidewire into the cavernous  segment of the left ICA and then advanced to the level of occlusion. The aspiration catheter was connected to a penumbra aspiration pump. The guiding catheter balloon was inflated. The aspiration catheter were removed under constant aspiration. Follow-up left ICA angiograms showed persistent occlusion which appear to have progressed more distally. Under biplane roadmap, a zoom 55 aspiration catheter was navigated over an Aristotle 18 microguidewire into the cavernous segment of the left ICA and then advanced to the level of occlusion. The aspiration catheter was connected to a penumbra aspiration pump. The guiding catheter balloon was inflated. The aspiration catheter were removed under constant aspiration. Follow-up left ICA angiograms showed no significant change. Under biplane roadmap, a zoom 55 aspiration catheter was navigated over a phenom 21 microcatheter and a Aristotle 18 microguidewire into the cavernous segment of the right ICA. The microcatheter was then navigated over the wire into the left M3/MCA middle division branch. Then, a 4 x 40 mm solitaire stent retriever was deployed spanning the left M2 and proximal M3 segment. The device was allowed to intercalated with the clot for 4 minutes. The microcatheter was removed. The aspiration catheter was advanced to the level of occlusion and connected to a penumbra aspiration pump. The thrombectomy device and aspiration catheter were removed under constant aspiration. Follow-up left ICA angiogram showed significant change. Under biplane roadmap, a zoom 55 aspiration catheter was navigated over a phenom 21 microcatheter and a Aristotle 18 microguidewire into the cavernous segment of the right ICA. The microcatheter was then navigated over the wire  into the left M3/MCA middle division branch. Then, a 4 x 40 mm solitaire stent retriever was deployed spanning the left M2 and proximal M3 segment. The device was allowed to intercalated with the clot for 4 minutes. The microcatheter was removed. The aspiration catheter was advanced to the level of occlusion and connected to a penumbra aspiration pump. The thrombectomy device and aspiration catheter were removed under constant aspiration. Follow-up angiogram showed persistent occlusion. Flat panel CT of the head was obtained and post processed in a separate workstation with concurrent attending physician supervision. Selected images were sent to PACS. Small contrast extravasation in the left sylvian fissure noted. Follow-up angiogram showed no evidence of active contrast extravasation with persistent occlusion of a left M2 branch. Delay flat panel CT of the head was obtained and post processed in a separate workstation with concurrent attending physician supervision. Selected images were sent to PACS. Left sylvian contrast extravasation appears stable. Delayed left ICA angiograms confirmed no evidence of contrast extravasation. The catheter was subsequently withdrawn. Left common femoral artery angiograms were obtained in frontal and lateral views. The puncture is at the level of the common femoral artery which has adequate caliber for closure device. The sheath was exchanged for a Perclose ProGlide which was utilized for access closure. Immediate hemostasis was achieved. IMPRESSION: Unsuccessful attempted recanalization of a mid left M2/MCA middle division branch complicated by small amount contrast extravasation in the left sylvian fissure. No active extravasation seen at the end of the procedure. Left MCA status remains TICI2b, which is similar to presentation. PLAN: Patient is to remain intubated given unsuccessful recanalization and COVID positive test. She will be transferred to ICU for continued care.  Electronically Signed   By: Pedro Earls M.D.   On: 11/27/2020 09:25   IR CT Head Ltd  Result Date: 11/27/2020 INDICATION: 70 year old female with past medical history significant for rheumatoid arthritis, diabetes mellitus, hypertension, anemia and melanoma presenting with acute onset  of right hemiparesis, aphasia and left gaze preference. She was found to be covered positive today. Last known well at 10:30 a.m. on 11/24/2020; modified Rankin scale 0; NIHSS 7. No IV tPA given as she was outside the window. Head CT showed an acute infarct the in the left insula and CT angiogram showed intracranial atherosclerotic disease with a left M2/MCA occlusion. EXAM: Ultrasound-guided vascular access Diagnostic cerebral angiogram Mechanical thrombectomy Flat panel head CT COMPARISON:  CT/CT angiogram of the head and neck November 24, 2020. MEDICATIONS: Refer to anesthesia documentation. ANESTHESIA/SEDATION: The procedure was performed under general anesthesia CONTRAST:  For 75 mL of Omnipaque 240 mg/mL FLUOROSCOPY TIME:  Fluoroscopy Time: 24 minutes 54 seconds (1,520 mGy). COMPLICATIONS: SIR Level A - No therapy, no consequence. TECHNIQUE: Informed written consent was obtained from the patient's son after a thorough discussion of the procedural risks, benefits and alternatives. All questions were addressed. Maximal Sterile Barrier Technique was utilized including caps, mask, sterile gowns, sterile gloves, sterile drape, hand hygiene and skin antiseptic. A timeout was performed prior to the initiation of the procedure. The right groin was prepped and draped in the usual sterile fashion. Using a micropuncture kit and the modified Seldinger technique, access was gained to the right common femoral artery and an 8 French sheath was placed. Real-time ultrasound guidance was utilized for vascular access including the acquisition of a permanent ultrasound image documenting patency of the accessed vessel. Under  fluoroscopy, a Zoom 88 guide catheter was navigated over a 6 [place] Berenstein 2 catheter and a 0.035" Terumo Glidewire into the aortic arch. The catheter was placed into the left common carotid artery and then advanced into the left internal carotid artery. The inner catheter was removed. Frontal and lateral angiograms of the head were obtained. FINDINGS: 1. Occlusion of the mid left M2/MCA middle division branch. 2. Intracranial atherosclerotic disease with multiple luminal irregularities along the left MCA and left ACA vascular tree more significant at the A3 segment where there is moderate stenosis. 3. Patent right common femoral artery. PROCEDURE: Under biplane roadmap, a zoom 55 aspiration catheter was navigated over an Aristotle 18 microguidewire into the cavernous segment of the left ICA and then advanced to the level of occlusion. The aspiration catheter was connected to a penumbra aspiration pump. The guiding catheter balloon was inflated. The aspiration catheter were removed under constant aspiration. Follow-up left ICA angiograms showed persistent occlusion which appear to have progressed more distally. Under biplane roadmap, a zoom 55 aspiration catheter was navigated over an Aristotle 18 microguidewire into the cavernous segment of the left ICA and then advanced to the level of occlusion. The aspiration catheter was connected to a penumbra aspiration pump. The guiding catheter balloon was inflated. The aspiration catheter were removed under constant aspiration. Follow-up left ICA angiograms showed no significant change. Under biplane roadmap, a zoom 55 aspiration catheter was navigated over a phenom 21 microcatheter and a Aristotle 18 microguidewire into the cavernous segment of the right ICA. The microcatheter was then navigated over the wire into the left M3/MCA middle division branch. Then, a 4 x 40 mm solitaire stent retriever was deployed spanning the left M2 and proximal M3 segment. The device  was allowed to intercalated with the clot for 4 minutes. The microcatheter was removed. The aspiration catheter was advanced to the level of occlusion and connected to a penumbra aspiration pump. The thrombectomy device and aspiration catheter were removed under constant aspiration. Follow-up left ICA angiogram showed significant change. Under biplane roadmap, a  zoom 55 aspiration catheter was navigated over a phenom 21 microcatheter and a Aristotle 18 microguidewire into the cavernous segment of the right ICA. The microcatheter was then navigated over the wire into the left M3/MCA middle division branch. Then, a 4 x 40 mm solitaire stent retriever was deployed spanning the left M2 and proximal M3 segment. The device was allowed to intercalated with the clot for 4 minutes. The microcatheter was removed. The aspiration catheter was advanced to the level of occlusion and connected to a penumbra aspiration pump. The thrombectomy device and aspiration catheter were removed under constant aspiration. Follow-up angiogram showed persistent occlusion. Flat panel CT of the head was obtained and post processed in a separate workstation with concurrent attending physician supervision. Selected images were sent to PACS. Small contrast extravasation in the left sylvian fissure noted. Follow-up angiogram showed no evidence of active contrast extravasation with persistent occlusion of a left M2 branch. Delay flat panel CT of the head was obtained and post processed in a separate workstation with concurrent attending physician supervision. Selected images were sent to PACS. Left sylvian contrast extravasation appears stable. Delayed left ICA angiograms confirmed no evidence of contrast extravasation. The catheter was subsequently withdrawn. Left common femoral artery angiograms were obtained in frontal and lateral views. The puncture is at the level of the common femoral artery which has adequate caliber for closure device. The  sheath was exchanged for a Perclose ProGlide which was utilized for access closure. Immediate hemostasis was achieved. IMPRESSION: Unsuccessful attempted recanalization of a mid left M2/MCA middle division branch complicated by small amount contrast extravasation in the left sylvian fissure. No active extravasation seen at the end of the procedure. Left MCA status remains TICI2b, which is similar to presentation. PLAN: Patient is to remain intubated given unsuccessful recanalization and COVID positive test. She will be transferred to ICU for continued care. Electronically Signed   By: Pedro Earls M.D.   On: 11/27/2020 09:25   IR US Guide Vasc Access Right  Result Date: 11/27/2020 INDICATION: 70 year old female with past medical history significant for rheumatoid arthritis, diabetes mellitus, hypertension, anemia and melanoma presenting with acute onset of right hemiparesis, aphasia and left gaze preference. She was found to be covered positive today. Last known well at 10:30 a.m. on 11/24/2020; modified Rankin scale 0; NIHSS 7. No IV tPA given as she was outside the window. Head CT showed an acute infarct the in the left insula and CT angiogram showed intracranial atherosclerotic disease with a left M2/MCA occlusion. EXAM: Ultrasound-guided vascular access Diagnostic cerebral angiogram Mechanical thrombectomy Flat panel head CT COMPARISON:  CT/CT angiogram of the head and neck November 24, 2020. MEDICATIONS: Refer to anesthesia documentation. ANESTHESIA/SEDATION: The procedure was performed under general anesthesia CONTRAST:  For 75 mL of Omnipaque 240 mg/mL FLUOROSCOPY TIME:  Fluoroscopy Time: 24 minutes 54 seconds (1,520 mGy). COMPLICATIONS: SIR Level A - No therapy, no consequence. TECHNIQUE: Informed written consent was obtained from the patient's son after a thorough discussion of the procedural risks, benefits and alternatives. All questions were addressed. Maximal Sterile Barrier  Technique was utilized including caps, mask, sterile gowns, sterile gloves, sterile drape, hand hygiene and skin antiseptic. A timeout was performed prior to the initiation of the procedure. The right groin was prepped and draped in the usual sterile fashion. Using a micropuncture kit and the modified Seldinger technique, access was gained to the right common femoral artery and an 8 French sheath was placed. Real-time ultrasound guidance was utilized for  vascular access including the acquisition of a permanent ultrasound image documenting patency of the accessed vessel. Under fluoroscopy, a Zoom 88 guide catheter was navigated over a 6 [place] Berenstein 2 catheter and a 0.035" Terumo Glidewire into the aortic arch. The catheter was placed into the left common carotid artery and then advanced into the left internal carotid artery. The inner catheter was removed. Frontal and lateral angiograms of the head were obtained. FINDINGS: 1. Occlusion of the mid left M2/MCA middle division branch. 2. Intracranial atherosclerotic disease with multiple luminal irregularities along the left MCA and left ACA vascular tree more significant at the A3 segment where there is moderate stenosis. 3. Patent right common femoral artery. PROCEDURE: Under biplane roadmap, a zoom 55 aspiration catheter was navigated over an Aristotle 18 microguidewire into the cavernous segment of the left ICA and then advanced to the level of occlusion. The aspiration catheter was connected to a penumbra aspiration pump. The guiding catheter balloon was inflated. The aspiration catheter were removed under constant aspiration. Follow-up left ICA angiograms showed persistent occlusion which appear to have progressed more distally. Under biplane roadmap, a zoom 55 aspiration catheter was navigated over an Aristotle 18 microguidewire into the cavernous segment of the left ICA and then advanced to the level of occlusion. The aspiration catheter was connected to a  penumbra aspiration pump. The guiding catheter balloon was inflated. The aspiration catheter were removed under constant aspiration. Follow-up left ICA angiograms showed no significant change. Under biplane roadmap, a zoom 55 aspiration catheter was navigated over a phenom 21 microcatheter and a Aristotle 18 microguidewire into the cavernous segment of the right ICA. The microcatheter was then navigated over the wire into the left M3/MCA middle division branch. Then, a 4 x 40 mm solitaire stent retriever was deployed spanning the left M2 and proximal M3 segment. The device was allowed to intercalated with the clot for 4 minutes. The microcatheter was removed. The aspiration catheter was advanced to the level of occlusion and connected to a penumbra aspiration pump. The thrombectomy device and aspiration catheter were removed under constant aspiration. Follow-up left ICA angiogram showed significant change. Under biplane roadmap, a zoom 55 aspiration catheter was navigated over a phenom 21 microcatheter and a Aristotle 18 microguidewire into the cavernous segment of the right ICA. The microcatheter was then navigated over the wire into the left M3/MCA middle division branch. Then, a 4 x 40 mm solitaire stent retriever was deployed spanning the left M2 and proximal M3 segment. The device was allowed to intercalated with the clot for 4 minutes. The microcatheter was removed. The aspiration catheter was advanced to the level of occlusion and connected to a penumbra aspiration pump. The thrombectomy device and aspiration catheter were removed under constant aspiration. Follow-up angiogram showed persistent occlusion. Flat panel CT of the head was obtained and post processed in a separate workstation with concurrent attending physician supervision. Selected images were sent to PACS. Small contrast extravasation in the left sylvian fissure noted. Follow-up angiogram showed no evidence of active contrast extravasation with  persistent occlusion of a left M2 branch. Delay flat panel CT of the head was obtained and post processed in a separate workstation with concurrent attending physician supervision. Selected images were sent to PACS. Left sylvian contrast extravasation appears stable. Delayed left ICA angiograms confirmed no evidence of contrast extravasation. The catheter was subsequently withdrawn. Left common femoral artery angiograms were obtained in frontal and lateral views. The puncture is at the level of the common femoral  artery which has adequate caliber for closure device. The sheath was exchanged for a Perclose ProGlide which was utilized for access closure. Immediate hemostasis was achieved. IMPRESSION: Unsuccessful attempted recanalization of a mid left M2/MCA middle division branch complicated by small amount contrast extravasation in the left sylvian fissure. No active extravasation seen at the end of the procedure. Left MCA status remains TICI2b, which is similar to presentation. PLAN: Patient is to remain intubated given unsuccessful recanalization and COVID positive test. She will be transferred to ICU for continued care. Electronically Signed   By: Pedro Earls M.D.   On: 11/27/2020 09:25   CT CEREBRAL PERFUSION W CONTRAST  Result Date: 11/24/2020 CLINICAL DATA:  Neuro deficit, acute, stroke suspected EXAM: CT PERFUSION BRAIN TECHNIQUE: Multiphase CT imaging of the brain was performed following IV bolus contrast injection. Subsequent parametric perfusion maps were calculated using RAPID software. CONTRAST:  73m OMNIPAQUE IOHEXOL 350 MG/ML SOLN COMPARISON:  Concurrent CTA head and neck. FINDINGS: CT Brain Perfusion Findings: CBF (<30%) Volume: 019mPerfusion (Tmax>6.0s) volume: 3051mismatch Volume: 28m3mPECTS on noncontrast CT Head: 9 at 16:42 today. Infarct Core: 0 mL Infarction Location:Not applicable. IMPRESSION: Perfusion imaging demonstrates no infarct core. 30 mL region of ischemia  involving the left MCA territory. Electronically Signed   By: ChikPrimitivo Gauze.   On: 11/24/2020 16:55   DG Chest Port 1 View  Result Date: 12/03/2020 CLINICAL DATA:  Shortness of breath.  COVID-19 positive EXAM: PORTABLE CHEST 1 VIEW COMPARISON:  December 02, 2020 FINDINGS: Patchy airspace opacity is noted in the lung bases, more on the right than on the left. Lungs elsewhere clear. Heart is upper normal in size with pulmonary vascularity normal. No adenopathy. There is aortic atherosclerosis. There is calcification in each carotid artery. No bone lesions. IMPRESSION: Persistent airspace opacity bases, more on the right than the left, likely due to atypical organism pneumonia. No new opacity evident. Stable cardiac silhouette. Aortic Atherosclerosis (ICD10-I70.0). There are foci of carotid artery calcification bilaterally. Electronically Signed   By: WillLowella Grip M.D.   On: 12/03/2020 08:41   DG Chest Port 1 View  Result Date: 12/02/2020 CLINICAL DATA:  Shortness of breath EXAM: PORTABLE CHEST 1 VIEW COMPARISON:  November 24, 2020 FINDINGS: The cardiomediastinal silhouette is unchanged in contour.Interval extubation. Atherosclerotic calcifications of the aorta. No pleural effusion. No pneumothorax. Patchy RIGHT basilar peripheral opacities, similar in comparison to prior. Mildly improved aeration of the LEFT lung base in comparison to prior. Visualized abdomen is unremarkable. Multilevel degenerative changes of the thoracic spine. IMPRESSION: 1. Patchy RIGHT basilar peripheral opacities, similar in comparison to prior and consistent with the sequela of COVID-19 infection. 2. Mildly improved aeration of the LEFT lung base in comparison to prior. Electronically Signed   By: StepValentino Saxon  On: 12/02/2020 11:26   DG Chest Port 1 View  Result Date: 11/24/2020 CLINICAL DATA:  COVID positive EXAM: PORTABLE CHEST 1 VIEW COMPARISON:  None. FINDINGS: The heart size and mediastinal contours are  mildly enlarged. ETT is 4 cm above the carina. NG tube is seen below the diaphragm. Aortic knob calcifications are seen. There is hazy patchy airspace opacity seen at the periphery of the right lower lung and at the left lung base. No pleural effusion. IMPRESSION: ETT and NG tube in satisfactory position Bilateral multifocal airspace opacities, consistent with multifocal pneumonia Electronically Signed   By: BindPrudencio Pair.   On: 11/24/2020 22:14   IR PERCUTANEOUS ART THROMBECTOMY/INFUSION  INTRACRANIAL INC DIAG ANGIO  Result Date: 11/27/2020 INDICATION: 70 year old female with past medical history significant for rheumatoid arthritis, diabetes mellitus, hypertension, anemia and melanoma presenting with acute onset of right hemiparesis, aphasia and left gaze preference. She was found to be covered positive today. Last known well at 10:30 a.m. on 11/24/2020; modified Rankin scale 0; NIHSS 7. No IV tPA given as she was outside the window. Head CT showed an acute infarct the in the left insula and CT angiogram showed intracranial atherosclerotic disease with a left M2/MCA occlusion. EXAM: Ultrasound-guided vascular access Diagnostic cerebral angiogram Mechanical thrombectomy Flat panel head CT COMPARISON:  CT/CT angiogram of the head and neck November 24, 2020. MEDICATIONS: Refer to anesthesia documentation. ANESTHESIA/SEDATION: The procedure was performed under general anesthesia CONTRAST:  For 75 mL of Omnipaque 240 mg/mL FLUOROSCOPY TIME:  Fluoroscopy Time: 24 minutes 54 seconds (1,520 mGy). COMPLICATIONS: SIR Level A - No therapy, no consequence. TECHNIQUE: Informed written consent was obtained from the patient's son after a thorough discussion of the procedural risks, benefits and alternatives. All questions were addressed. Maximal Sterile Barrier Technique was utilized including caps, mask, sterile gowns, sterile gloves, sterile drape, hand hygiene and skin antiseptic. A timeout was performed prior to the  initiation of the procedure. The right groin was prepped and draped in the usual sterile fashion. Using a micropuncture kit and the modified Seldinger technique, access was gained to the right common femoral artery and an 8 French sheath was placed. Real-time ultrasound guidance was utilized for vascular access including the acquisition of a permanent ultrasound image documenting patency of the accessed vessel. Under fluoroscopy, a Zoom 88 guide catheter was navigated over a 6 [place] Berenstein 2 catheter and a 0.035" Terumo Glidewire into the aortic arch. The catheter was placed into the left common carotid artery and then advanced into the left internal carotid artery. The inner catheter was removed. Frontal and lateral angiograms of the head were obtained. FINDINGS: 1. Occlusion of the mid left M2/MCA middle division branch. 2. Intracranial atherosclerotic disease with multiple luminal irregularities along the left MCA and left ACA vascular tree more significant at the A3 segment where there is moderate stenosis. 3. Patent right common femoral artery. PROCEDURE: Under biplane roadmap, a zoom 55 aspiration catheter was navigated over an Aristotle 18 microguidewire into the cavernous segment of the left ICA and then advanced to the level of occlusion. The aspiration catheter was connected to a penumbra aspiration pump. The guiding catheter balloon was inflated. The aspiration catheter were removed under constant aspiration. Follow-up left ICA angiograms showed persistent occlusion which appear to have progressed more distally. Under biplane roadmap, a zoom 55 aspiration catheter was navigated over an Aristotle 18 microguidewire into the cavernous segment of the left ICA and then advanced to the level of occlusion. The aspiration catheter was connected to a penumbra aspiration pump. The guiding catheter balloon was inflated. The aspiration catheter were removed under constant aspiration. Follow-up left ICA  angiograms showed no significant change. Under biplane roadmap, a zoom 55 aspiration catheter was navigated over a phenom 21 microcatheter and a Aristotle 18 microguidewire into the cavernous segment of the right ICA. The microcatheter was then navigated over the wire into the left M3/MCA middle division branch. Then, a 4 x 40 mm solitaire stent retriever was deployed spanning the left M2 and proximal M3 segment. The device was allowed to intercalated with the clot for 4 minutes. The microcatheter was removed. The aspiration catheter was advanced to the level of occlusion  and connected to a penumbra aspiration pump. The thrombectomy device and aspiration catheter were removed under constant aspiration. Follow-up left ICA angiogram showed significant change. Under biplane roadmap, a zoom 55 aspiration catheter was navigated over a phenom 21 microcatheter and a Aristotle 18 microguidewire into the cavernous segment of the right ICA. The microcatheter was then navigated over the wire into the left M3/MCA middle division branch. Then, a 4 x 40 mm solitaire stent retriever was deployed spanning the left M2 and proximal M3 segment. The device was allowed to intercalated with the clot for 4 minutes. The microcatheter was removed. The aspiration catheter was advanced to the level of occlusion and connected to a penumbra aspiration pump. The thrombectomy device and aspiration catheter were removed under constant aspiration. Follow-up angiogram showed persistent occlusion. Flat panel CT of the head was obtained and post processed in a separate workstation with concurrent attending physician supervision. Selected images were sent to PACS. Small contrast extravasation in the left sylvian fissure noted. Follow-up angiogram showed no evidence of active contrast extravasation with persistent occlusion of a left M2 branch. Delay flat panel CT of the head was obtained and post processed in a separate workstation with concurrent  attending physician supervision. Selected images were sent to PACS. Left sylvian contrast extravasation appears stable. Delayed left ICA angiograms confirmed no evidence of contrast extravasation. The catheter was subsequently withdrawn. Left common femoral artery angiograms were obtained in frontal and lateral views. The puncture is at the level of the common femoral artery which has adequate caliber for closure device. The sheath was exchanged for a Perclose ProGlide which was utilized for access closure. Immediate hemostasis was achieved. IMPRESSION: Unsuccessful attempted recanalization of a mid left M2/MCA middle division branch complicated by small amount contrast extravasation in the left sylvian fissure. No active extravasation seen at the end of the procedure. Left MCA status remains TICI2b, which is similar to presentation. PLAN: Patient is to remain intubated given unsuccessful recanalization and COVID positive test. She will be transferred to ICU for continued care. Electronically Signed   By: Pedro Earls M.D.   On: 11/27/2020 09:25   CT HEAD CODE STROKE WO CONTRAST  Result Date: 11/24/2020 CLINICAL DATA:  Code stroke.  Neuro deficit, acute, stroke suspected EXAM: CT HEAD WITHOUT CONTRAST TECHNIQUE: Contiguous axial images were obtained from the base of the skull through the vertex without intravenous contrast. COMPARISON:  06/27/2020. FINDINGS: Brain: No intracranial hemorrhage. New focal hypodensity involving the left insula. No mass lesion. No midline shift, ventriculomegaly or extra-axial fluid collection. Chronic microvascular ischemic changes. Vascular: No hyperdense vessel or unexpected calcification. Minimal carotid siphon atherosclerotic calcifications. Skull: Negative for fracture or focal lesion. Sinuses/Orbits: No acute finding.  Mild pansinus mucosal thickening. Other: Right parietal scalp nodule is unchanged. ASPECTS [facility] Stroke Program Early CT Score) - Ganglionic  level infarction (caudate, lentiform nuclei, internal capsule, insula, M1-M3 cortex): 6 - Supraganglionic infarction (M4-M6 cortex): 3 Total score (0-10 with 10 being normal): 9 IMPRESSION: 1. 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 2. Chronic microvascular ischemic changes. 3. ASPECTS is 9 Code stroke imaging results were communicated on 11/24/2020 at 4:23 pm to provider Dr. Rory Percy via secure text paging. Electronically Signed   By: Primitivo Gauze M.D.   On: 11/24/2020 16:24   VAS [place] LOWER EXTREMITY VENOUS (DVT)  Result Date: 11/26/2020  Lower Venous DVT Study Indications: Stroke, and Covid-19.  Comparison Study: No prior study on file Performing Technologist: Sharion Dove RVS  Examination Guidelines:  A complete evaluation includes B-mode imaging, spectral Doppler, color Doppler, and power Doppler as needed of all accessible portions of each vessel. Bilateral testing is considered an integral part of a complete examination. Limited examinations for reoccurring indications may be performed as noted. The reflux portion of the exam is performed with the patient in reverse Trendelenburg.  +---------+---------------+---------+-----------+----------+--------------+ RIGHT    CompressibilityPhasicitySpontaneityPropertiesThrombus Aging +---------+---------------+---------+-----------+----------+--------------+ CFV      Full           Yes      Yes                                 +---------+---------------+---------+-----------+----------+--------------+ SFJ      Full                                                        +---------+---------------+---------+-----------+----------+--------------+ FV Prox  Full                                                        +---------+---------------+---------+-----------+----------+--------------+ FV Mid   Full                                                         +---------+---------------+---------+-----------+----------+--------------+ FV DistalFull                                                        +---------+---------------+---------+-----------+----------+--------------+ PFV      Full                                                        +---------+---------------+---------+-----------+----------+--------------+ POP      Full           Yes      Yes                                 +---------+---------------+---------+-----------+----------+--------------+ PTV      Full                                                        +---------+---------------+---------+-----------+----------+--------------+ PERO     None                                         Acute          +---------+---------------+---------+-----------+----------+--------------+   +---------+---------------+---------+-----------+----------+--------------+  LEFT     CompressibilityPhasicitySpontaneityPropertiesThrombus Aging +---------+---------------+---------+-----------+----------+--------------+ CFV      Full           Yes      Yes                                 +---------+---------------+---------+-----------+----------+--------------+ SFJ      Full                                                        +---------+---------------+---------+-----------+----------+--------------+ FV Prox  Full                                                        +---------+---------------+---------+-----------+----------+--------------+ FV Mid   Full                                                        +---------+---------------+---------+-----------+----------+--------------+ FV DistalFull                                                        +---------+---------------+---------+-----------+----------+--------------+ PFV      Full                                                         +---------+---------------+---------+-----------+----------+--------------+ POP      Full           Yes      Yes                                 +---------+---------------+---------+-----------+----------+--------------+ PTV      Full                                                        +---------+---------------+---------+-----------+----------+--------------+ PERO     Full                                                        +---------+---------------+---------+-----------+----------+--------------+     Summary: RIGHT: - Findings consistent with acute deep vein thrombosis involving the right peroneal veins.  LEFT: - There is no evidence of deep vein thrombosis in the lower extremity.  *See table(s) above for measurements and observations. Electronically signed by Servando Snare  MD on 11/26/2020 at 5:59:41 PM.    Final    ECHOCARDIOGRAM LIMITED  Result Date: 11/25/2020    ECHOCARDIOGRAM REPORT   Patient Name:   CASSIA FEIN [facility] Date of Exam: 11/25/2020 Medical Rec #:  [id number]        Height:       64.0 in Accession #:    [id number]       Weight:       176.4 lb Date of Birth:  1951-04-05        BSA:          1.855 m Patient Age:    76 years         BP:           119/66 mmHg Patient Gender: F                HR:           77 bpm. Exam Location:  Inpatient Procedure: Limited Echo, Cardiac Doppler and Color Doppler Indications:    Stroke I63.9  History:        Patient has no prior history of Echocardiogram examinations.                 Risk Factors:Hypertension, Diabetes and Former Smoker.  Sonographer:    Vickie Epley RDCS Referring Phys: [id number] ASHISH ARORA  Sonographer Comments: Echo performed with patient supine and on artificial respirator. Covid positive. IMPRESSIONS  1. Left ventricular ejection fraction, by estimation, is 55 to 60%. The left ventricle has normal function. The left ventricle has no regional wall motion abnormalities. Left ventricular diastolic parameters are consistent  with Grade I diastolic dysfunction (impaired relaxation).  2. Right ventricular systolic function is normal. The right ventricular size is normal.  3. The mitral valve is normal in structure. Trivial mitral valve regurgitation. No evidence of mitral stenosis.  4. The aortic valve is tricuspid. Aortic valve regurgitation is trivial. Mild aortic valve sclerosis is present, with no evidence of aortic valve stenosis.  5. The inferior vena cava is normal in size with greater than 50% respiratory variability, suggesting right atrial pressure of 3 mmHg. FINDINGS  Left Ventricle: Left ventricular ejection fraction, by estimation, is 55 to 60%. The left ventricle has normal function. The left ventricle has no regional wall motion abnormalities. The left ventricular internal cavity size was normal in size. There is  no left ventricular hypertrophy. Left ventricular diastolic parameters are consistent with Grade I diastolic dysfunction (impaired relaxation). Right Ventricle: The right ventricular size is normal.Right ventricular systolic function is normal. Left Atrium: Left atrial size was normal in size. Right Atrium: Right atrial size was normal in size. Pericardium: There is no evidence of pericardial effusion. Mitral Valve: The mitral valve is normal in structure. Trivial mitral valve regurgitation. No evidence of mitral valve stenosis. Tricuspid Valve: The tricuspid valve is normal in structure. Tricuspid valve regurgitation is trivial. No evidence of tricuspid stenosis. Aortic Valve: The aortic valve is tricuspid. Aortic valve regurgitation is trivial. Mild aortic valve sclerosis is present, with no evidence of aortic valve stenosis. Pulmonic Valve: The pulmonic valve was not well visualized. Pulmonic valve regurgitation is trivial. No evidence of pulmonic stenosis. Aorta: The aortic root is normal in size and structure. Venous: The inferior vena cava is normal in size with greater than 50% respiratory variability,  suggesting right atrial pressure of 3 mmHg.  LEFT VENTRICLE PLAX 2D LVIDd:         4.10 cm  Diastology LVIDs:         3.20 cm     LV e' medial:    6.09 cm/s LV PW:         1.00 cm     LV E/e' medial:  11.1 LV IVS:        0.90 cm     LV e' lateral:   9.03 cm/s LVOT diam:     2.00 cm     LV E/e' lateral: 7.5 LV SV:         42 LV SV Index:   23 LVOT Area:     3.14 cm  LV Volumes (MOD) LV vol d, MOD A2C: 88.5 ml LV vol d, MOD A4C: 80.0 ml LV vol s, MOD A2C: 46.6 ml LV vol s, MOD A4C: 41.6 ml LV SV MOD A2C:     41.9 ml LV SV MOD A4C:     80.0 ml LV SV MOD BP:      40.6 ml RIGHT VENTRICLE RV S prime:     13.80 cm/s TAPSE (M-mode): 1.5 cm LEFT ATRIUM         Index LA diam:    2.70 cm 1.46 cm/m  AORTIC VALVE LVOT Vmax:   71.80 cm/s LVOT Vmean:  56.700 cm/s LVOT VTI:    0.134 m  AORTA Ao Root diam: 3.70 cm MITRAL VALVE MV Area (PHT): 3.85 cm    SHUNTS MV Decel Time: 197 msec    Systemic VTI:  0.13 m MV E velocity: 67.70 cm/s  Systemic Diam: 2.00 cm MV A velocity: 81.00 cm/s MV E/A ratio:  0.84 Kirk Ruths MD Electronically signed by Kirk Ruths MD Signature Date/Time: 11/25/2020/10:54:57 AM    Final    CT ANGIO HEAD CODE STROKE  Result Date: 11/24/2020 CLINICAL DATA:  Neuro deficit, acute, stroke suspected EXAM: CT ANGIOGRAPHY HEAD AND NECK TECHNIQUE: Multidetector CT imaging of the head and neck was performed using the standard protocol during bolus administration of intravenous contrast. Multiplanar CT image reconstructions and MIPs were obtained to evaluate the vascular anatomy. Carotid stenosis measurements (when applicable) are obtained utilizing NASCET criteria, using the distal internal carotid diameter as the denominator. CONTRAST:  62m OMNIPAQUE IOHEXOL 350 MG/ML SOLN COMPARISON:  None. FINDINGS: CTA NECK FINDINGS Aortic arch: Standard branching. Mild aortic arch atherosclerotic calcifications. Mild proximal left subclavian artery narrowing secondary to atheromatous plaque. Otherwise patent great  vessel origins. Right carotid system: Patent. Minimal proximal ICA atherosclerotic calcifications without significant narrowing. Left carotid system: Patent. 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 Vertebral arteries: Mild left vertebral artery origin narrowing secondary to atheromatous disease. Otherwise patent and codominant. Skeleton: No acute finding.  Mild spondylosis. Other neck: No adenopathy.  No soft tissue mass. Upper chest: Patchy and nodular bilateral upper lung opacities concerning for infectious/inflammatory foci. Review of the MIP images confirms the above findings CTA HEAD FINDINGS Anterior circulation: Minimal carotid siphon atherosclerotic calcifications. Patent ICAs. Patent ophthalmic artery origins. Right A1 segment hypoplasia. Patent ACAs. Patent right MCA. Left M2 segment occlusion. Posterior circulation: Patent V4 segments. Patent right PICA. Patent basilar and superior cerebellar arteries. Patent bilateral PCAs. Venous sinuses: As permitted by contrast timing, patent. Anatomic variants: Please see above. Review of the MIP images confirms the above findings IMPRESSION: CTA neck: 30-40% distal left CCA narrowing secondary to atheromatous plaque. Mild narrowing of the proximal left subclavian and left vertebral artery origin. Bilateral upper lung nodular/patchy opacities concerning for infectious/inflammatory foci. CTA head: Left M2 branch occlusion.  No aneurysm or dissection. Code stroke imaging results were communicated on 11/24/2020 at 4:41 pm to provider Dr. Rory Percy via secure text paging. Electronically Signed   By: Primitivo Gauze M.D.   On: 11/24/2020 16:41   CT ANGIO NECK CODE STROKE  Result Date: 11/24/2020 CLINICAL DATA:  Neuro deficit, acute, stroke suspected EXAM: CT ANGIOGRAPHY HEAD AND NECK TECHNIQUE: Multidetector CT imaging of the head and neck was performed using the standard protocol during bolus administration of intravenous contrast. Multiplanar  CT image reconstructions and MIPs were obtained to evaluate the vascular anatomy. Carotid stenosis measurements (when applicable) are obtained utilizing NASCET criteria, using the distal internal carotid diameter as the denominator. CONTRAST:  37m OMNIPAQUE IOHEXOL 350 MG/ML SOLN COMPARISON:  None. FINDINGS: CTA NECK FINDINGS Aortic arch: Standard branching. Mild aortic arch atherosclerotic calcifications. Mild proximal left subclavian artery narrowing secondary to atheromatous plaque. Otherwise patent great vessel origins. Right carotid system: Patent. Minimal proximal ICA atherosclerotic calcifications without significant narrowing. Left carotid system: Patent. 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 Vertebral arteries: Mild left vertebral artery origin narrowing secondary to atheromatous disease. Otherwise patent and codominant. Skeleton: No acute finding.  Mild spondylosis. Other neck: No adenopathy.  No soft tissue mass. Upper chest: Patchy and nodular bilateral upper lung opacities concerning for infectious/inflammatory foci. Review of the MIP images confirms the above findings CTA HEAD FINDINGS Anterior circulation: Minimal carotid siphon atherosclerotic calcifications. Patent ICAs. Patent ophthalmic artery origins. Right A1 segment hypoplasia. Patent ACAs. Patent right MCA. Left M2 segment occlusion. Posterior circulation: Patent V4 segments. Patent right PICA. Patent basilar and superior cerebellar arteries. Patent bilateral PCAs. Venous sinuses: As permitted by contrast timing, patent. Anatomic variants: Please see above. Review of the MIP images confirms the above findings IMPRESSION: CTA neck: 30-40% distal left CCA narrowing secondary to atheromatous plaque. Mild narrowing of the proximal left subclavian and left vertebral artery origin. Bilateral upper lung nodular/patchy opacities concerning for infectious/inflammatory foci. CTA head: Left M2 branch occlusion. No aneurysm or  dissection. Code stroke imaging results were communicated on 11/24/2020 at 4:41 pm to provider Dr. ARory Percyvia secure text paging. Electronically Signed   By: CPrimitivo GauzeM.D.   On: 11/24/2020 16:41    Micro Results   No results found for this or any previous visit (from the past 240 hour(s)).     Today   Subjective:   FRosalita Levantoday with no significant  events overnight as discussed with staff, she herself denies any complaints.    Objective:   Blood pressure (!) 110/59, pulse 78, temperature 98.5 F (36.9 C), temperature source Axillary, resp. rate 20, height 5' 4"  (1.626 m), weight 66.2 kg, SpO2 99 %.   Intake/Output Summary (Last 24 hours) at 12/10/2020 1023 Last data filed at 12/09/2020 1535 Gross per 24 hour  Intake 180 ml  Output --  Net 180 ml    Exam Awake Alert, more interactive and communicative today, she is trying to verbalize words, he is with residual right-sided hemiparesis, aphasia and dysphagia. Symmetrical Chest wall movement, Good air movement bilaterally, CTAB RRR,No Gallops,Rubs or new Murmurs, No Parasternal Heave +ve B.Sounds, Abd Soft, Non tender,  No rebound -guarding or rigidity. No Cyanosis, Clubbing or edema, No new Rash or bruise  Data Review   CBC w Diff:  Lab Results  Component Value Date   WBC 9.9 12/07/2020   HGB 12.9 12/07/2020   HCT 42.4 12/07/2020   PLT 438 (H) 12/07/2020   LYMPHOPCT 19 12/06/2020  MONOPCT 10 12/06/2020   EOSPCT 1 12/06/2020   BASOPCT 1 12/06/2020    CMP:  Lab Results  Component Value Date   NA 135 12/07/2020   K 4.1 12/07/2020   CL 106 12/07/2020   CO2 19 (L) 12/07/2020   BUN 22 12/07/2020   CREATININE 0.76 12/07/2020   PROT 6.4 (L) 12/06/2020   ALBUMIN 2.5 (L) 12/06/2020   BILITOT 0.6 12/06/2020   ALKPHOS 67 12/06/2020   AST 16 12/06/2020   ALT 15 12/06/2020  .   Total Time in preparing paper work, data evaluation and todays exam - 40 minutes  Phillips Climes M.D on 12/10/2020 at  10:23 AM  [facility]   Office  [phone]

## 2020-12-28 ENCOUNTER — Other Ambulatory Visit: Payer: Self-pay

## 2020-12-28 ENCOUNTER — Encounter (HOSPITAL_COMMUNITY): Payer: Self-pay | Admitting: *Deleted

## 2020-12-28 ENCOUNTER — Emergency Department (HOSPITAL_COMMUNITY)
Admission: EM | Admit: 2020-12-28 | Discharge: 2020-12-29 | Disposition: A | Discharge status: Home / Self Care | Payer: Medicare Other | Attending: Emergency Medicine | Admitting: Emergency Medicine

## 2020-12-28 ENCOUNTER — Emergency Department (HOSPITAL_COMMUNITY): Payer: Medicare Other

## 2020-12-28 DIAGNOSIS — I10 Essential (primary) hypertension: Secondary | ICD-10-CM | POA: Diagnosis not present

## 2020-12-28 DIAGNOSIS — S0990XA Unspecified injury of head, initial encounter: Secondary | ICD-10-CM | POA: Diagnosis present

## 2020-12-28 DIAGNOSIS — E119 Type 2 diabetes mellitus without complications: Secondary | ICD-10-CM | POA: Diagnosis not present

## 2020-12-28 DIAGNOSIS — W050XXA Fall from non-moving wheelchair, initial encounter: Secondary | ICD-10-CM | POA: Diagnosis not present

## 2020-12-28 DIAGNOSIS — Z85828 Personal history of other malignant neoplasm of skin: Secondary | ICD-10-CM | POA: Insufficient documentation

## 2020-12-28 DIAGNOSIS — Z8616 Personal history of COVID-19: Secondary | ICD-10-CM | POA: Diagnosis not present

## 2020-12-28 DIAGNOSIS — S0083XA Contusion of other part of head, initial encounter: Secondary | ICD-10-CM | POA: Insufficient documentation

## 2020-12-28 DIAGNOSIS — F0390 Unspecified dementia without behavioral disturbance: Secondary | ICD-10-CM | POA: Insufficient documentation

## 2020-12-28 DIAGNOSIS — W19XXXA Unspecified fall, initial encounter: Secondary | ICD-10-CM

## 2020-12-28 HISTORY — DX: Cerebral infarction, unspecified: I63.9

## 2020-12-28 HISTORY — DX: Unspecified dementia, unspecified severity, without behavioral disturbance, psychotic disturbance, mood disturbance, and anxiety: F03.90

## 2020-12-28 HISTORY — DX: Unspecified dementia without behavioral disturbance: F03.90

## 2020-12-28 LAB — PROTIME-INR
INR: 1.7 — ABNORMAL HIGH (ref 0.8–1.2)
Prothrombin Time: 19 seconds — ABNORMAL HIGH (ref 11.4–15.2)

## 2020-12-28 LAB — COMPREHENSIVE METABOLIC PANEL
ALT: 20 U/L (ref 0–44)
AST: 26 U/L (ref 15–41)
Albumin: 3.3 g/dL — ABNORMAL LOW (ref 3.5–5.0)
Alkaline Phosphatase: 81 U/L (ref 38–126)
Anion gap: 12 (ref 5–15)
BUN: 10 mg/dL (ref 8–23)
CO2: 22 mmol/L (ref 22–32)
Calcium: 9.2 mg/dL (ref 8.9–10.3)
Chloride: 106 mmol/L (ref 98–111)
Creatinine, Ser: 0.79 mg/dL (ref 0.44–1.00)
GFR, Estimated: >60 mL/min (ref >60)
Glucose, Bld: 213 mg/dL — ABNORMAL HIGH (ref 70–99)
Potassium: 3.3 mmol/L — ABNORMAL LOW (ref 3.5–5.1)
Sodium: 140 mmol/L (ref 135–145)
Total Bilirubin: 0.7 mg/dL (ref 0.3–1.2)
Total Protein: 7 g/dL (ref 6.5–8.1)

## 2020-12-28 LAB — CBC
HCT: 37.3 % (ref 36.0–46.0)
Hemoglobin: 11.7 g/dL — ABNORMAL LOW (ref 12.0–15.0)
MCH: 25.5 pg — ABNORMAL LOW (ref 26.0–34.0)
MCHC: 31.4 g/dL (ref 30.0–36.0)
MCV: 81.4 fL (ref 80.0–100.0)
Platelets: 343 10*3/uL (ref 150–400)
RBC: 4.58 MIL/uL (ref 3.87–5.11)
RDW: 17.1 % — ABNORMAL HIGH (ref 11.5–15.5)
WBC: 7.5 10*3/uL (ref 4.0–10.5)
nRBC: 0 % (ref 0.0–0.2)

## 2020-12-28 LAB — CBG MONITORING, ED: Glucose-Capillary: 242 mg/dL — ABNORMAL HIGH (ref 70–99)

## 2020-12-28 NOTE — ED Triage Notes (Signed)
Patient presents to ED via [facility] from [facility] per staff patient threw herself out of her wheelchair. Hitting her nose on the floor.

## 2020-12-28 NOTE — ED Provider Notes (Signed)
Emergency Department Provider Note   I have reviewed the triage vital signs and the nursing notes.   HISTORY  Chief Complaint Fall   HPI Emily Lozano is a 70 y.o. female presents to the emergency department from [facility] as a level 2 trauma by protocol after falling on blood thinners.  Patient was in her wheelchair and the fall was reportedly witnessed by staff.  The saw her shift forward in the chair and fall landing on her face.  No loss of consciousness.  She is at her mental status baseline.  There is some swelling over the forehead.  Patient with history of stroke and unable to provide significant history regarding the event.   Level 5 caveat: AMS   Past Medical History:  Diagnosis Date  . Anemia   . Arthritis   . 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   . CVA (cerebral vascular accident) ([place])   . Dementia ([place])   . Depression   . Diabetes mellitus without complication ([place])   . Hypertension   . Melanoma ([place]) 2014   resected from Left forearm.     Patient Active Problem List   Diagnosis Date Noted  . Pneumonia due to COVID-19 virus   . Acute ischemic stroke ([place]) 11/24/2020    Allergies Ativan [lorazepam] and Morphine and related  No family history on file.  Social History Social History   Tobacco Use  . Smoking status: Never Smoker  . Smokeless tobacco: Never Used  Vaping Use  . Vaping Use: Never used  Substance Use Topics  . Alcohol use: Not Currently  . Drug use: Never    Review of Systems  Constitutional: No fever/chills Eyes: No visual changes. ENT: No sore throat. Cardiovascular: Denies chest pain. Respiratory: Denies shortness of breath. Gastrointestinal: No abdominal pain.  No nausea, no vomiting.  No diarrhea.  No constipation. Genitourinary: Negative for dysuria. Musculoskeletal: Negative for back pain. Skin: Negative for rash. Frontal scalp hematoma  Neurological: Negative for focal weakness or numbness.  Positive HA.  10-point ROS otherwise negative.  ____________________________________________   PHYSICAL EXAM:  VITAL SIGNS: ED Triage Vitals  Enc Vitals Group     BP 12/28/20 1501 (!) 148/88     Pulse Rate 12/28/20 1501 67     Resp 12/28/20 1501 20     Temp 12/28/20 1501 97.7 F (36.5 C)     Temp Source 12/28/20 1501 Oral     SpO2 12/28/20 1501 100 %   Constitutional: Alert. Well appearing and in no acute distress. Eyes: Conjunctivae are normal. PERRL.  Head: Hematoma to forehead. No laceration.  Nose: No congestion/rhinnorhea. Mouth/Throat: Mucous membranes are moist.  Neck: No stridor.   Cardiovascular: Normal rate, regular rhythm. Good peripheral circulation. Grossly normal heart sounds.   Respiratory: Normal respiratory effort.  No retractions. Lungs CTAB. Gastrointestinal: Soft and nontender. No distention.  Musculoskeletal: No lower extremity tenderness nor edema. No gross deformities of extremities. Neurologic:  Normal speech and language.  Skin:  Skin is warm and dry. Abrasion to the nose.   ____________________________________________   LABS (all labs ordered are listed, but only abnormal results are displayed)  Labs Reviewed  COMPREHENSIVE METABOLIC PANEL - Abnormal; Notable for the following components:      Result Value   Potassium 3.3 (*)    Glucose, Bld 213 (*)    Albumin 3.3 (*)    All other components within normal limits  PROTIME-INR - Abnormal; Notable for the following  components:   Prothrombin Time 19.0 (*)    INR 1.7 (*)    All other components within normal limits  CBC - Abnormal; Notable for the following components:   Hemoglobin 11.7 (*)    MCH 25.5 (*)    RDW 17.1 (*)    All other components within normal limits  CBG MONITORING, ED - Abnormal; Notable for the following components:   Glucose-Capillary 242 (*)    All other components within normal limits  CBC WITH DIFFERENTIAL/PLATELET    ____________________________________________  RADIOLOGY CT head, c-spine, and max face pending.  ____________________________________________   PROCEDURES  Procedure(s) performed:   Procedures  None  ____________________________________________   INITIAL IMPRESSION / ASSESSMENT AND PLAN / ED COURSE  Pertinent labs & imaging results that were available during my care of the patient were reviewed by me and considered in my medical decision making (see chart for details).   Patient presents to the ED with head injury after fall from a wheelchair on blood thinners. By protocol the patient is a level 2 trauma. She is awake, alert, and at her mental status baseline. Labs and CT imaging pending sent to r/o ICH, SDH, fracture.   Dr. Sherry Ruffing to follow CT scans and reassess.    ____________________________________________  FINAL CLINICAL IMPRESSION(S) / ED DIAGNOSES  Final diagnoses:  Fall, initial encounter  Traumatic hematoma of forehead, initial encounter   Note:  This document was prepared using Dragon voice recognition software and may include unintentional dictation errors.  Nanda Quinton, MD, [facility] Emergency Medicine    Ceonna Frazzini, Wonda [facility], MD 12/30/20 [phone]

## 2020-12-28 NOTE — ED Notes (Signed)
9th on PTAR list

## 2020-12-28 NOTE — ED Provider Notes (Signed)
4:13 PM Care sent from Dr. Laverta [place].  At time of transfer of care, patient is awaiting for results of screening labs and CT scans.  Patient had a fall hitting her head while on blood thinners.  If work-up is reassuring, anticipate discharge home per previous team plan.  8:33 PM Work-up is all returned.  Imaging denies any acute bony traumatic injuries or intracranial.  The nasal and forehead hematoma is seen but no evidence of acute injuries otherwise.  Family reports the patient is at her baseline now and agree with her discharge home after reassuring labs otherwise.  Patient be discharged home with follow-up instructions with her PCP.  She had no other questions or concerns and was discharged back to her facility in stable condition   Clinical Impression: 1. Fall, initial encounter   2. Traumatic hematoma of forehead, initial encounter     Disposition: Discharge  Condition: Good  I have discussed the results, Dx and Tx plan with the pt(& family if present). He/she/they expressed understanding and agree(s) with the plan. Discharge instructions discussed at great length. Strict return precautions discussed and pt &/or family have verbalized understanding of the instructions. No further questions at time of discharge.    New Prescriptions   No medications on file    Follow Up: [facility] [address] [id number] [place] [phone]    your PCP         Daejah Klebba, Gwenyth Allegra, MD 12/28/20 2112

## 2020-12-28 NOTE — Progress Notes (Signed)
Orthopedic Tech Progress Note Patient Details:  DANEA MANTER 26-Aug-1951 [id number] Level 2 Trauma. Not needed Patient ID: THEKLA COLBORN, female   DOB: [birth year], 70 y.o.   MRN: [id number]   Chip Boer 12/28/2020, 2:57 PM

## 2020-12-28 NOTE — ED Notes (Signed)
Patient back from CT.

## 2020-12-28 NOTE — ED Notes (Signed)
Patient is resting comfortably. 

## 2020-12-28 NOTE — Discharge Instructions (Signed)
Your work-up today revealed a large hematoma to your forehead and nose but otherwise no significant traumatic injuries.  Please follow-up with your primary doctor and rest and stay hydrated.  Please be careful not to fall again.  If any symptoms change or worsen, please return to the nearest emergency department

## 2020-12-29 ENCOUNTER — Encounter (HOSPITAL_COMMUNITY): Payer: Self-pay | Admitting: Student in an Organized Health Care Education/Training Program

## 2021-01-08 ENCOUNTER — Telehealth: Payer: Self-pay | Admitting: Adult Health

## 2021-01-08 ENCOUNTER — Inpatient Hospital Stay: Payer: Self-pay | Admitting: Adult Health

## 2021-01-08 NOTE — Progress Notes (Deleted)
[facility] [address] [place]. [place] [address] [phone]       HOSPITAL FOLLOW UP NOTE  [name] Date of Birth:  Jul 26, 1951 Medical Record Number:  [id number]   Reason for Referral:  hospital stroke follow up    SUBJECTIVE:   CHIEF COMPLAINT:  No chief complaint on file.   HPI:   [name] is a 70 y.o. female Hx of multiple cerebrovascular risk factors who presents on 11/24/2020 with sudden onset of left gaze preference, right facial droop, hemiparesis and expressive aphasia still with left hemispheric dysfunction.  Personally reviewed hospitalization pertinent progress notes, lab work and imaging with summary provided.  Evaluated by Dr. 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 Fib vs hypercoagulable state with COVID however unable to rule out arthrosclerosis. LE doppler showed acute R peroneal vein DVT likely related to Covid hypercoagulable state.  New diagnosis of A. fib with RVR requiring amiodarone infusion and rate control with metoprolol.  Due to new onset A. fib as well as acute DVT, initiated Eliquis 5 mg twice daily for secondary stroke prevention.  LDL 70.6 on Crestor 10 mg daily.  A1c 10.6.  Other stroke risk factors include HTN, advanced age and former tobacco use.  Evaluated by therapies who recommended discharge to SNF for residual global aphasia, dysphagia, left gaze preference, lack of blink to threat on right, right facial weakness, left hemiparesis and right hemiplegia.   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 However, atherosclerosis is also likely part of the etiology  CT head Acute infarct involving the left insular cortex.  CTA head and neck Left M2 segment occlusion.   CT perfusion no core  and 30cc ischemia.  IR unsuccessful IR with left M2 atherosclerosis  MRI  Acute infarction in the left MCA territory affecting the deep insula and left frontoparietal cortical and subcortical brain. Minimal petechial blood products or susceptibility artifact related to thrombosed vessels.   CT repeat 3/6 stable no progression, mild petechial hemorrhage  2D Echo 55 to 60%  LDL 70.6  HgbA1c 10.6  VTE prophylaxis - heparin IV  aspirin 81 mg daily prior to admission, now on heparin IV. Will switch to eliquis  Therapy recommendations: SNF  Disposition:  SNF d/c 12/10/2020  Today, 01/08/2021, Emily Lozano is being seen for hospital follow-up         ROS:   14 system review of systems performed and negative with exception of ***  PMH:  Past Medical History:  Diagnosis Date  . Anemia   . Arthritis   . 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   . CVA (cerebral vascular accident) ([place])   . Dementia ([place])   . Depression   . Diabetes mellitus without complication ([place])   . Hypertension   . Melanoma ([place]) 2014   resected from Left forearm.     PSH:  Past Surgical History:  Procedure Laterality Date  . ABDOMINAL HYSTERECTOMY    . BREAST LUMPECTOMY    . CARPAL TUNNEL RELEASE    . ESOPHAGEAL MANOMETRY N/A 12/05/2015   Procedure: ESOPHAGEAL MANOMETRY (EM);  Surgeon: Josefine Class, MD;  Location[facility] ENDOSCOPY;  Service: Endoscopy;  Laterality: N/A;  . ESOPHAGOGASTRODUODENOSCOPY (EGD) WITH PROPOFOL N/A 11/26/2015   Procedure: ESOPHAGOGASTRODUODENOSCOPY (EGD)  WITH PROPOFOL;  Surgeon: Lollie Sails, MD;  Location: [facility] ENDOSCOPY;  Service: Endoscopy;  Laterality: N/A;  . ESOPHAGOGASTRODUODENOSCOPY (EGD) WITH PROPOFOL N/A 04/30/2017   Procedure: ESOPHAGOGASTRODUODENOSCOPY (EGD) WITH PROPOFOL;  Surgeon: Lollie Sails, MD;  Location: [facility] ENDOSCOPY;  Service: Endoscopy;  Laterality: N/A;  . EXCISION MELANOMA WITH SENTINEL LYMPH NODE BIOPSY    . IR CT  HEAD LTD  11/24/2020  . IR CT HEAD LTD  11/24/2020  . IR PERCUTANEOUS ART THROMBECTOMY/INFUSION INTRACRANIAL INC DIAG ANGIO  11/24/2020  . IR US GUIDE VASC ACCESS RIGHT  11/24/2020  . RADIOLOGY WITH ANESTHESIA N/A 11/24/2020   Procedure: IR WITH ANESTHESIA;  Surgeon: Radiologist, Medication, MD;  Location: [place];  Service: Radiology;  Laterality: N/A;    Social History:  Social History   Socioeconomic History  . Marital status: Single    Spouse name: Not on file  . Number of children: Not on file  . Years of education: Not on file  . Highest education level: Not on file  Occupational History  . Not on file  Tobacco Use  . Smoking status: Never Smoker  . Smokeless tobacco: Never Used  Vaping Use  . Vaping Use: Never used  Substance and Sexual Activity  . Alcohol use: Not Currently  . Drug use: Never  . Sexual activity: Not Currently  Other Topics Concern  . Not on file  Social History Narrative   ** Merged History Encounter **       Social Determinants of Health   Financial Resource Strain: Not on file  Food Insecurity: Not on file  Transportation Needs: Not on file  Physical Activity: Not on file  Stress: Not on file  Social Connections: Not on file  Intimate Partner Violence: Not on file    Family History: No family history on file.  Medications:   Current Outpatient Medications on File Prior to Visit  Medication Sig Dispense Refill  . apixaban (ELIQUIS) 5 MG TABS tablet Take 1 tablet (5 mg total) by mouth 2 (two) times daily. 60 tablet   . feeding supplement (ENSURE ENLIVE / ENSURE PLUS) LIQD Take 237 mLs by mouth 3 (three) times daily between meals. 237 mL 12  . insulin aspart (NOVOLOG) 100 UNIT/ML injection Inject 0-15 Units into the skin 3 (three) times daily with meals. 10 mL 11  . insulin detemir (LEVEMIR) 100 UNIT/ML injection Inject 0.14 mLs (14 Units total) into the skin daily. 10 mL 11  . Maltodextrin-Xanthan Gum (RESOURCE THICKENUP CLEAR) POWD nectar thick     . metoprolol tartrate (LOPRESSOR) 50 MG tablet Take 1 tablet (50 mg total) by mouth 2 (two) times daily.    . mometasone-formoterol (DULERA) 100-5 MCG/ACT AERO Inhale 2 puffs into the lungs 2 (two) times daily. 1 each   . pantoprazole (PROTONIX) 40 MG tablet Take 40 mg by mouth 2 (two) times daily.    . rosuvastatin (CRESTOR) 10 MG tablet Take 10 mg by mouth daily.     No current facility-administered medications on file prior to visit.    Allergies:   Allergies  Allergen Reactions  . Ativan [Lorazepam]   . Morphine And Related Nausea And Vomiting      OBJECTIVE:  Physical Exam  There were no vitals filed for this visit. There is no height or weight on file to calculate BMI. No exam data present  No flowsheet data found.   General: well developed, well nourished, seated, in no evident distress Head: head normocephalic and atraumatic.  Neck: supple with no carotid or supraclavicular bruits Cardiovascular: regular rate and rhythm, no murmurs Musculoskeletal: no deformity Skin:  no rash/petichiae Vascular:  Normal pulses all extremities   Neurologic Exam Mental Status: Awake and fully alert. Oriented to place and time. Recent and remote memory intact. Attention span, concentration and fund of knowledge appropriate. Mood and affect appropriate.  Cranial Nerves: Fundoscopic exam reveals sharp disc margins. Pupils equal, briskly reactive to light. Extraocular movements full without nystagmus. Visual fields full to confrontation. Hearing intact. Facial sensation intact. Face, tongue, palate moves normally and symmetrically.  Motor: Normal bulk and tone. Normal strength in all tested extremity muscles Sensory.: intact to touch , pinprick , position and vibratory sensation.  Coordination: Rapid alternating movements normal in all extremities. Finger-to-nose and heel-to-shin performed accurately bilaterally. Gait and Station: Arises from chair without difficulty. Stance is normal.  Gait demonstrates normal stride length and balance with ***. Tandem walk and heel toe ***.  Reflexes: 1+ and symmetric. Toes downgoing.     NIHSS  *** Modified Rankin  ***      ASSESSMENT: Emily Lozano is a 70 y.o. year old female with recent left M2 territory infarct on 11/24/2020 due to left M2 occlusion s/p unsuccessful IR, likely embolic secondary to new dx of A. Fib vs COVID hypercoagulable state as well as acute R LE DVT. Vascular risk factors include new dx of A. fib with RVR, acute DVT, COVID +, HTN, HLD, uncontrolled DM, advanced age and former tobacco use.      PLAN:  1. L MCA stroke : Residual deficit: ***. Continue Eliquis (apixaban) daily  and ***  for secondary stroke prevention.  Discussed secondary stroke prevention measures and importance of close PCP follow up for aggressive stroke risk factor management  a. HTN: BP goal 130-150 given L M2 occlusion.  Stable on *** per PCP b. HLD: LDL goal <70. Recent LDL 70.6 on Crestor 10 mg daily.  c. DMII: A1c goal<7.0. Recent A1c 10.6.  2. A. Fib, new dx: On Eliquis 5 mg twice daily for CHA2DS2-VASc score of at least 6 as well as metoprolol for rate control 3. Acute DVT: R peroneal DVT on Eliquis    Follow up in *** or call earlier if needed   CC:  [facility] provider: Dr. Leonie Man PCP: Marinda Elk, MD    I spent *** minutes of face-to-face and non-face-to-face time with patient.  This included previsit chart review, lab review, study review, order entry, electronic health record documentation, patient education regarding recent stroke, residual deficits, importance of managing stroke risk factors and answered all questions to patient satisfaction     Frann Rider, [facility]  [facility] [address] [place],  [address]  Phone [phone] Fax [phone] Note: This document was prepared with digital dictation and possible smart phrase technology. Any transcriptional  errors that result from this process are unintentional.

## 2021-01-08 NOTE — Telephone Encounter (Signed)
I called # for pt mobile and that is not her #. [phone], work [employer] (does not work there anymore). Pt resides at [facility],  Her appt was no show today, but has appt 01-10-21 Thursday rescheduled.

## 2021-01-08 NOTE — Telephone Encounter (Signed)
[facility] called, Pt  missed her appt, do not know reason.

## 2021-01-10 ENCOUNTER — Inpatient Hospital Stay: Payer: Medicare Other | Admitting: Adult Health

## 2021-01-24 ENCOUNTER — Encounter: Payer: Self-pay | Admitting: Adult Health

## 2021-01-24 ENCOUNTER — Other Ambulatory Visit: Payer: Self-pay

## 2021-01-24 ENCOUNTER — Ambulatory Visit (INDEPENDENT_AMBULATORY_CARE_PROVIDER_SITE_OTHER): Payer: Medicare Other | Admitting: Adult Health

## 2021-01-24 VITALS — BP 168/88 | HR 54 | Ht 61.0 in | Wt 143.0 lb

## 2021-01-24 DIAGNOSIS — I63412 Cerebral infarction due to embolism of left middle cerebral artery: Secondary | ICD-10-CM | POA: Diagnosis not present

## 2021-01-24 NOTE — Progress Notes (Signed)
Done, faxed to [phone] ATTN [place],  received fax conformation at 16:19

## 2021-01-24 NOTE — Progress Notes (Signed)
I agree with the above plan 

## 2021-01-24 NOTE — Progress Notes (Signed)
[facility] [address] [place]. [place] [address] [phone]       HOSPITAL FOLLOW UP NOTE  [name] Date of Birth:  1951/02/06 Medical Record Number:  [id number]   Reason for Referral:  hospital stroke follow up    SUBJECTIVE:   CHIEF COMPLAINT:  Chief Complaint  Patient presents with  . Follow-up    RM 14 with son (brent) Pt isn't the best per son, R sided weakness, high anxiety/depression, speech difficulty.    HPI:   [name] is a 70 y.o. female Hx of multiple cerebrovascular risk factors who presents on 11/24/2020 with sudden onset of left gaze preference, right facial droop, hemiparesis and expressive aphasia still with left hemispheric dysfunction.  Personally reviewed hospitalization pertinent progress notes, lab work and imaging with summary provided.  Evaluated by Dr. 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 Fib vs hypercoagulable state with COVID however unable to rule out arthrosclerosis. LE doppler showed acute R peroneal vein DVT likely related to Covid hypercoagulable state.  New diagnosis of A. fib with RVR requiring amiodarone infusion and rate control with metoprolol.  Due to new onset A. fib as well as acute DVT, initiated Eliquis 5 mg twice daily for secondary stroke prevention.  LDL 70.6 on Crestor 10 mg daily.  A1c 10.6.  Other stroke risk factors include HTN, advanced age and former tobacco use.  Evaluated by therapies who recommended discharge to SNF for residual global aphasia, dysphagia, left gaze preference, lack of blink to threat on right, right facial weakness, left hemiparesis and right hemiplegia.   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 However, atherosclerosis is also likely part of  the etiology  CT head Acute infarct involving the left insular cortex.  CTA head and neck Left M2 segment occlusion.   CT perfusion no core and 30cc ischemia.  IR unsuccessful IR with left M2 atherosclerosis  MRI  Acute infarction in the left MCA territory affecting the deep insula and left frontoparietal cortical and subcortical brain. Minimal petechial blood products or susceptibility artifact related to thrombosed vessels.   CT repeat 3/6 stable no progression, mild petechial hemorrhage  2D Echo 55 to 60%  LDL 70.6  HgbA1c 10.6  VTE prophylaxis - heparin IV  aspirin 81 mg daily prior to admission, now on heparin IV. Will switch to eliquis  Therapy recommendations: SNF  Disposition:  SNF d/c 12/10/2020  Today, 01/24/2021, Emily Lozano is being seen for hospital follow-up accompanied by her son  Unfortunately, patient was brought to office 60 minutes prior to her visit as she is currently residing at [facility] and rehab and was brought by transportation. Per son, she has been sitting in wheelchair for a prolonged period of time and became very agitated while in the waiting room.  Attempted to reposition her in wheelchair when she was brought back to exam room but she continued to yell out and very upset complaining of pain.  Unable to do any type of assessment and only very limited history taking due to her continued agitation and difficulty redirecting.  Per son, she does have her good days and bad days (such as today).  But on her good days, she has no difficulty understanding what her son is telling her and speech has  been improving.  She continues to have no movement on her right side and is wheelchair-bound.  Son reports increased anxiety and likely depression as she was previously completely independent and now completely dependent.  Per review of MAR, she has remained on Eliquis and Crestor.  She is also currently on Depakote, and Lexapro for depression and mood disorder.  She  is also on gabapentin for right hemiplegia.  She was recently seen in the ED after a fall where she fell from her wheelchair with resultant nasal and forehead hematoma but CT head negative for acute bony traumatic injuries or intracranial bleed.  She continues to have frontal hematoma but per son, this has been improving.     ROS:   N/A  PMH:  Past Medical History:  Diagnosis Date  . Anemia   . Arthritis   . 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   . CVA (cerebral vascular accident) ([place])   . Dementia ([place])   . Depression   . Diabetes mellitus without complication ([place])   . Hypertension   . Melanoma ([place]) 2014   resected from Left forearm.     PSH:  Past Surgical History:  Procedure Laterality Date  . ABDOMINAL HYSTERECTOMY    . BREAST LUMPECTOMY    . CARPAL TUNNEL RELEASE    . ESOPHAGEAL MANOMETRY N/A 12/05/2015   Procedure: ESOPHAGEAL MANOMETRY (EM);  Surgeon: Josefine Class, MD;  Location: [facility] ENDOSCOPY;  Service: Endoscopy;  Laterality: N/A;  . ESOPHAGOGASTRODUODENOSCOPY (EGD) WITH PROPOFOL N/A 11/26/2015   Procedure: ESOPHAGOGASTRODUODENOSCOPY (EGD) WITH PROPOFOL;  Surgeon: Lollie Sails, MD;  Location: [facility] ENDOSCOPY;  Service: Endoscopy;  Laterality: N/A;  . ESOPHAGOGASTRODUODENOSCOPY (EGD) WITH PROPOFOL N/A 04/30/2017   Procedure: ESOPHAGOGASTRODUODENOSCOPY (EGD) WITH PROPOFOL;  Surgeon: Lollie Sails, MD;  Location: [facility] ENDOSCOPY;  Service: Endoscopy;  Laterality: N/A;  . EXCISION MELANOMA WITH SENTINEL LYMPH NODE BIOPSY    . IR CT HEAD LTD  11/24/2020  . IR CT HEAD LTD  11/24/2020  . IR PERCUTANEOUS ART THROMBECTOMY/INFUSION INTRACRANIAL INC DIAG ANGIO  11/24/2020  . IR US GUIDE VASC ACCESS RIGHT  11/24/2020  . RADIOLOGY WITH ANESTHESIA N/A 11/24/2020   Procedure: IR WITH ANESTHESIA;  Surgeon: Radiologist, Medication, MD;  Location: [place];  Service: Radiology;  Laterality: N/A;    Social History:  Social History   Socioeconomic History   . Marital status: Single    Spouse name: Not on file  . Number of children: Not on file  . Years of education: Not on file  . Highest education level: Not on file  Occupational History  . Not on file  Tobacco Use  . Smoking status: Never Smoker  . Smokeless tobacco: Never Used  Vaping Use  . Vaping Use: Never used  Substance and Sexual Activity  . Alcohol use: Not Currently  . Drug use: Never  . Sexual activity: Not Currently  Other Topics Concern  . Not on file  Social History Narrative   ** Merged History Encounter **       Social Determinants of Health   Financial Resource Strain: Not on file  Food Insecurity: Not on file  Transportation Needs: Not on file  Physical Activity: Not on file  Stress: Not on file  Social Connections: Not on file  Intimate Partner Violence: Not on file    Family History: History reviewed. No pertinent family history.  Medications:   Current Outpatient Medications on File Prior to Visit  Medication Sig Dispense  Refill  . acetaminophen (TYLENOL) 500 MG tablet Take 500 mg by mouth every 6 (six) hours as needed.    Marland Kitchen amoxicillin (AMOXIL) 875 MG tablet Take 875 mg by mouth 2 (two) times daily.    Marland Kitchen apixaban (ELIQUIS) 5 MG TABS tablet Take 1 tablet (5 mg total) by mouth 2 (two) times daily. 60 tablet   . buPROPion (WELLBUTRIN SR) 150 MG 12 hr tablet Take 150 mg by mouth 2 (two) times daily.    . divalproex (DEPAKOTE SPRINKLE) 125 MG capsule Take 125 mg by mouth 2 (two) times daily.    Marland Kitchen escitalopram (LEXAPRO) 10 MG tablet Take 1 tablet by mouth daily.    . feeding supplement (ENSURE ENLIVE / ENSURE PLUS) LIQD Take 237 mLs by mouth 3 (three) times daily between meals. 237 mL 12  . gabapentin (NEURONTIN) 100 MG capsule Take by mouth.    . insulin aspart (NOVOLOG) 100 UNIT/ML injection Inject 0-15 Units into the skin 3 (three) times daily with meals. 10 mL 11  . insulin detemir (LEVEMIR) 100 UNIT/ML injection Inject 0.14 mLs (14 Units total) into  the skin daily. 10 mL 11  . loratadine (CLARITIN) 10 MG tablet Take 10 mg by mouth daily.    Marland Kitchen LORazepam (ATIVAN) 1 MG tablet Take 1 mg by mouth every 8 (eight) hours.    . Maltodextrin-Xanthan Gum (RESOURCE THICKENUP CLEAR) POWD nectar thick    . melatonin 5 MG TABS Take 5 mg by mouth.    . metoprolol tartrate (LOPRESSOR) 50 MG tablet Take 1 tablet (50 mg total) by mouth 2 (two) times daily.    . mometasone-formoterol (DULERA) 100-5 MCG/ACT AERO Inhale 2 puffs into the lungs 2 (two) times daily. 1 each   . NYAMYC powder Apply topically.    . pantoprazole (PROTONIX) 40 MG tablet Take 40 mg by mouth 2 (two) times daily.    . rosuvastatin (CRESTOR) 10 MG tablet Take 10 mg by mouth daily.    Marland Kitchen tuberculin (TUBERSOL) 5 UNIT/0.1ML injection Inject 0.1 mLs into the skin once.     No current facility-administered medications on file prior to visit.    Allergies:   Allergies  Allergen Reactions  . Ativan [Lorazepam]   . Morphine And Related Nausea And Vomiting      OBJECTIVE:  Physical Exam  Vitals:   01/24/21 1013  BP: (!) 168/88  Pulse: (!) 54  Weight: 143 lb (64.9 kg)  Height: 5\' 1"  (1.549 m)   Body mass index is 27.02 kg/m. No exam data present  Limited assessment due to severe agitation during visit    General: Frail severely anxious elderly female Cardiovascular: regular rate and rhythm, no murmurs    Neurologic Exam Mental Status: Awake and fully alert.   Severe expressive aphasia with only occasional appropriate words but appears to understand what was being said to her Cranial Nerves: UTA Motor: No movement on right side with increased tone-no appreciated weakness on left side Sensory.: UTA Coordination: UTA Gait and Station: Deferred as patient nonambulatory Reflexes: UTA      ASSESSMENT: Emily Lozano is a 70 y.o. year old female with recent left M2 territory infarct on 11/24/2020 due to left M2 occlusion s/p unsuccessful IR, likely embolic secondary to  new dx of A. Fib vs COVID hypercoagulable state as well as acute R LE DVT. Vascular risk factors include new dx of A. fib with RVR, acute DVT, COVID +, HTN, HLD, uncontrolled DM, advanced age and former tobacco use.  Great difficulty completing today's visit as patient arrived to office 60 minutes prior to visit and spent a prolonged time sitting in wheelchair with increased pain and agitation by the time her appointment was beginning.  Very short conversation with son discussing residual deficits and likely cause of stroke and recommended follow-up in 2 months for further evaluation.   PLAN:  1. L MCA stroke : Extensive residual deficits (unable to fully assess today) -continue working with therapies at SNF [facility] and rehab.  Continue current treatment plan on Eliquis (apixaban) daily  and Crestor for secondary stroke prevention.  Discussed secondary stroke prevention measures and importance of close PCP follow up for aggressive stroke risk factor management  a. HTN: BP goal 130-150 given L M2 occlusion.  Slightly elevated today likely due to increased pain.  Continue to monitor at facility b. HLD: LDL goal <70. Recent LDL 70.6 on Crestor 10 mg daily.  c. DMII: A1c goal<7.0. Recent A1c 10.6.  Uncontrolled.  Monitor at facility. 2. A. Fib, new dx: On Eliquis 5 mg twice daily for CHA2DS2-VASc score of at least 6 as well as metoprolol for rate control monitored by cardiology 3. Acute DVT: R peroneal DVT on Eliquis. Will plan on repeating in 3-6 months    Follow up in 2 or call earlier if needed   CC:  [place] provider: Dr. Leonie Man PCP: Marinda Elk, MD     Frann Rider, [facility]  [facility] [address] [place], [place] [address]  Phone [phone] Fax [phone] Note: This document was prepared with digital dictation and possible smart phrase technology. Any transcriptional errors that result from this process are unintentional.

## 2021-03-12 ENCOUNTER — Other Ambulatory Visit: Payer: Self-pay

## 2021-03-12 NOTE — Patient Outreach (Signed)
[facility]) Care Management  03/12/2021  Emily Lozano 03-29-1951 [id number]   First telephone outreach attempt to obtain mRS. No answer. Unable to leave message for returned call.  Philmore Pali [facility] Management Assistant [phone]

## 2021-03-19 ENCOUNTER — Other Ambulatory Visit: Payer: Self-pay

## 2021-03-19 NOTE — Patient Outreach (Signed)
[facility]) Care Management  03/19/2021  OASIS GOEHRING September 12, 1951 [id number]   Second telephone outreach attempt to obtain mRS. No answer. Left message for returned call.  Philmore Pali [facility] Management Assistant [phone]

## 2021-03-25 ENCOUNTER — Other Ambulatory Visit: Payer: Self-pay

## 2021-03-25 NOTE — Patient Outreach (Signed)
[facility]) Care Management  03/25/2021  Emily Lozano [birth year] [id number]   3 outreach attempts were completed to obtain mRs. mRs could not be obtained because patient never returned my calls possibly due to being in a rehab facilty. Attempted to speak with nurse but not able to leave message with nurse. mRs=7    [facility] Management Assistant [phone]

## 2021-08-31 ENCOUNTER — Emergency Department: Payer: Medicare Other

## 2021-08-31 ENCOUNTER — Emergency Department
Admission: EM | Admit: 2021-08-31 | Discharge: 2021-08-31 | Disposition: A | Discharge status: Skilled Nursing Facility | Payer: Medicare Other | Attending: Emergency Medicine | Admitting: Emergency Medicine

## 2021-08-31 ENCOUNTER — Other Ambulatory Visit: Payer: Self-pay

## 2021-08-31 DIAGNOSIS — S7001XA Contusion of right hip, initial encounter: Secondary | ICD-10-CM | POA: Insufficient documentation

## 2021-08-31 DIAGNOSIS — Z8616 Personal history of COVID-19: Secondary | ICD-10-CM | POA: Diagnosis not present

## 2021-08-31 DIAGNOSIS — I10 Essential (primary) hypertension: Secondary | ICD-10-CM | POA: Diagnosis not present

## 2021-08-31 DIAGNOSIS — Z85820 Personal history of malignant melanoma of skin: Secondary | ICD-10-CM | POA: Diagnosis not present

## 2021-08-31 DIAGNOSIS — S79911A Unspecified injury of right hip, initial encounter: Secondary | ICD-10-CM | POA: Diagnosis present

## 2021-08-31 DIAGNOSIS — Z7901 Long term (current) use of anticoagulants: Secondary | ICD-10-CM | POA: Insufficient documentation

## 2021-08-31 DIAGNOSIS — E119 Type 2 diabetes mellitus without complications: Secondary | ICD-10-CM | POA: Insufficient documentation

## 2021-08-31 DIAGNOSIS — F0390 Unspecified dementia without behavioral disturbance: Secondary | ICD-10-CM | POA: Diagnosis not present

## 2021-08-31 DIAGNOSIS — Z794 Long term (current) use of insulin: Secondary | ICD-10-CM | POA: Insufficient documentation

## 2021-08-31 DIAGNOSIS — Y92128 Other place in nursing home as the place of occurrence of the external cause: Secondary | ICD-10-CM | POA: Diagnosis not present

## 2021-08-31 DIAGNOSIS — W19XXXA Unspecified fall, initial encounter: Secondary | ICD-10-CM | POA: Insufficient documentation

## 2021-08-31 DIAGNOSIS — Z7984 Long term (current) use of oral hypoglycemic drugs: Secondary | ICD-10-CM | POA: Diagnosis not present

## 2021-08-31 DIAGNOSIS — Z79899 Other long term (current) drug therapy: Secondary | ICD-10-CM | POA: Insufficient documentation

## 2021-08-31 LAB — CBC
HCT: 43.9 % (ref 36.0–46.0)
Hemoglobin: 13.4 g/dL (ref 12.0–15.0)
MCH: 24.4 pg — ABNORMAL LOW (ref 26.0–34.0)
MCHC: 30.5 g/dL (ref 30.0–36.0)
MCV: 80 fL (ref 80.0–100.0)
Platelets: 246 10*3/uL (ref 150–400)
RBC: 5.49 MIL/uL — ABNORMAL HIGH (ref 3.87–5.11)
RDW: 16.2 % — ABNORMAL HIGH (ref 11.5–15.5)
WBC: 6.6 10*3/uL (ref 4.0–10.5)
nRBC: 0 % (ref 0.0–0.2)

## 2021-08-31 LAB — COMPREHENSIVE METABOLIC PANEL
ALT: 14 U/L (ref 0–44)
AST: 15 U/L (ref 15–41)
Albumin: 3.8 g/dL (ref 3.5–5.0)
Alkaline Phosphatase: 58 U/L (ref 38–126)
Anion gap: 11 (ref 5–15)
BUN: 12 mg/dL (ref 8–23)
CO2: 22 mmol/L (ref 22–32)
Calcium: 8.9 mg/dL (ref 8.9–10.3)
Chloride: 106 mmol/L (ref 98–111)
Creatinine, Ser: 0.4 mg/dL — ABNORMAL LOW (ref 0.44–1.00)
GFR, Estimated: >60 mL/min (ref >60)
Glucose, Bld: 130 mg/dL — ABNORMAL HIGH (ref 70–99)
Potassium: 4 mmol/L (ref 3.5–5.1)
Sodium: 139 mmol/L (ref 135–145)
Total Bilirubin: 0.6 mg/dL (ref 0.3–1.2)
Total Protein: 7.4 g/dL (ref 6.5–8.1)

## 2021-08-31 MED ORDER — ACETAMINOPHEN 500 MG PO TABS
1000.0000 mg | ORAL_TABLET | Freq: Once | ORAL | Status: AC
  Administered 2021-08-31: 1000 mg via ORAL
  Filled 2021-08-31: qty 2

## 2021-08-31 NOTE — ED Notes (Signed)
RN attempted to call report; No answer at the facility. RN will continue to attempt to contact facility.

## 2021-08-31 NOTE — ED Notes (Signed)
Fall bracelet and socks placed on patient. Bed alarm positioned, and this RN verified alarm is on and working. Fall sign placed on door.

## 2021-08-31 NOTE — ED Triage Notes (Signed)
Patient coming for unwitnessed fall from [employer]. Patient has dementia, aphasia, and right sided deficits at baseline. Patient is on Eliquis.

## 2021-08-31 NOTE — ED Notes (Signed)
Patient transported to CT 

## 2021-08-31 NOTE — ED Notes (Signed)
Facility called for report; No answer.

## 2021-08-31 NOTE — ED Notes (Signed)
RN attempted to call report, no answer at facility.

## 2021-08-31 NOTE — ED Notes (Signed)
Transport team arrived. Patient had soiled brief. Patient cleaned, new incontinence brief placed on patient.

## 2021-08-31 NOTE — Discharge Instructions (Signed)
You have been seen in the emergency department after a fall.  Your work-up today including imaging of your head neck and right hip are normal.  Your right hip CT scan does show a contusion/bruise to this area.  Please use Tylenol 650 mg every 6 hours as needed for pain/discomfort.  Return to the emergency department for any symptom personally concerning to yourself or staff member.

## 2021-08-31 NOTE — ED Provider Notes (Signed)
[facility] Emergency Department Provider Note  Time seen: 9:45 AM  I have reviewed the triage vital signs and the nursing notes.   HISTORY  Chief Complaint Fall   HPI Emily Lozano is a 70 y.o. female with a past medical history of arthritis, CVA with aphasia, dementia, hypertension, diabetes, presents to the emergency department for a fall at her nursing facility.  According to EMS report patient had an unwitnessed fall at her nursing facility found on the ground.  Patient is on Eliquis.  Here the patient is awake alert, no distress lying in bed, pleasant, comfortable appearing.  Patient has aphasia as well as dementia, cannot contribute to her history in a meaningful manner or review of systems.  When asked if she is hurting the patient points to her right hip.  Patient is currently in a c-collar.   Past Medical History:  Diagnosis Date   Anemia    Arthritis    Collagen vascular disease ([place])    Rhematoid Arthritis.    CVA (cerebral vascular accident) ([place])    Dementia ([place])    Depression    Diabetes mellitus without complication ([place])    Hypertension    Melanoma ([place]) 2014   resected from Left forearm.     Patient Active Problem List   Diagnosis Date Noted   Pneumonia due to COVID-19 virus    Acute ischemic stroke ([place]) 11/24/2020    Past Surgical History:  Procedure Laterality Date   ABDOMINAL HYSTERECTOMY     BREAST LUMPECTOMY     CARPAL TUNNEL RELEASE     ESOPHAGEAL MANOMETRY N/A 12/05/2015   Procedure: ESOPHAGEAL MANOMETRY (EM);  Surgeon: Josefine Class, MD;  Location[facility] ENDOSCOPY;  Service: Endoscopy;  Laterality: N/A;   ESOPHAGOGASTRODUODENOSCOPY (EGD) WITH PROPOFOL N/A 11/26/2015   Procedure: ESOPHAGOGASTRODUODENOSCOPY (EGD) WITH PROPOFOL;  Surgeon: Lollie Sails, MD;  Location: [facility] ENDOSCOPY;  Service: Endoscopy;  Laterality: N/A;   ESOPHAGOGASTRODUODENOSCOPY (EGD) WITH PROPOFOL N/A 04/30/2017   Procedure:  ESOPHAGOGASTRODUODENOSCOPY (EGD) WITH PROPOFOL;  Surgeon: Lollie Sails, MD;  Location: [facility] ENDOSCOPY;  Service: Endoscopy;  Laterality: N/A;   EXCISION MELANOMA WITH SENTINEL LYMPH NODE BIOPSY     IR CT HEAD LTD  11/24/2020   IR CT HEAD LTD  11/24/2020   IR PERCUTANEOUS ART THROMBECTOMY/INFUSION INTRACRANIAL INC DIAG ANGIO  11/24/2020   IR US GUIDE VASC ACCESS RIGHT  11/24/2020   RADIOLOGY WITH ANESTHESIA N/A 11/24/2020   Procedure: IR WITH ANESTHESIA;  Surgeon: Radiologist, Medication, MD;  Location: [place];  Service: Radiology;  Laterality: N/A;    Prior to Admission medications   Medication Sig Start Date End Date Taking? Authorizing Provider  acetaminophen (TYLENOL) 500 MG tablet Take 500 mg by mouth every 6 (six) hours as needed.    [provider]  amoxicillin (AMOXIL) 875 MG tablet Take 875 mg by mouth 2 (two) times daily.    [provider]  apixaban (ELIQUIS) 5 MG TABS tablet Take 1 tablet (5 mg total) by mouth 2 (two) times daily. 12/10/20   Elgergawy, Silver Huguenin, MD  buPROPion (WELLBUTRIN SR) 150 MG 12 hr tablet Take 150 mg by mouth 2 (two) times daily.    [provider]  divalproex (DEPAKOTE SPRINKLE) 125 MG capsule Take 125 mg by mouth 2 (two) times daily. 01/08/21   [provider]  escitalopram (LEXAPRO) 10 MG tablet Take 1 tablet by mouth daily. 01/07/21   [provider]  feeding supplement (ENSURE ENLIVE / ENSURE  PLUS) LIQD Take 237 mLs by mouth 3 (three) times daily between meals. 12/10/20   Elgergawy, Silver Huguenin, MD  gabapentin (NEURONTIN) 100 MG capsule Take by mouth. 01/14/21   [provider]  insulin aspart (NOVOLOG) 100 UNIT/ML injection Inject 0-15 Units into the skin 3 (three) times daily with meals. 12/10/20   Elgergawy, Silver Huguenin, MD  insulin detemir (LEVEMIR) 100 UNIT/ML injection Inject 0.14 mLs (14 Units total) into the skin daily. 12/11/20   Elgergawy, Silver Huguenin, MD  loratadine (CLARITIN) 10 MG tablet Take 10 mg by  mouth daily.    [provider]  LORazepam (ATIVAN) 1 MG tablet Take 1 mg by mouth every 8 (eight) hours.    [provider]  Maltodextrin-Xanthan Gum ([place]) POWD nectar thick 12/10/20   Elgergawy, Silver Huguenin, MD  melatonin 5 MG TABS Take 5 mg by mouth.    [provider]  metoprolol tartrate (LOPRESSOR) 50 MG tablet Take 1 tablet (50 mg total) by mouth 2 (two) times daily. 12/10/20   Elgergawy, Silver Huguenin, MD  mometasone-formoterol (DULERA) 100-5 MCG/ACT AERO Inhale 2 puffs into the lungs 2 (two) times daily. 12/10/20   Elgergawy, Silver Huguenin, MD  [employer] powder Apply topically. 01/16/21   [provider]  pantoprazole (PROTONIX) 40 MG tablet Take 40 mg by mouth 2 (two) times daily.    [provider]  rosuvastatin (CRESTOR) 10 MG tablet Take 10 mg by mouth daily.    [provider]  tuberculin (TUBERSOL) 5 UNIT/0.1ML injection Inject 0.1 mLs into the skin once.    [provider]    Allergies  Allergen Reactions   Ativan [Lorazepam]    Morphine And Related Nausea And Vomiting    No family history on file.  Social History Social History   Tobacco Use   Smoking status: Never   Smokeless tobacco: Never  Vaping Use   Vaping Use: Never used  Substance Use Topics   Alcohol use: Not Currently   Drug use: Never    Review of Systems Unable to obtain adequate/accurate review of systems secondary to dementia and aphasia.  ____________________________________________   PHYSICAL EXAM:  VITAL SIGNS: ED Triage Vitals  Enc Vitals Group     BP 08/31/21 0904 (!) 209/87     Pulse Rate 08/31/21 0904 67     Resp 08/31/21 0904 16     Temp 08/31/21 0904 98.4 F (36.9 C)     Temp Source 08/31/21 0904 Oral     SpO2 08/31/21 0904 100 %     Weight 08/31/21 0907 143 lb 1.3 oz (64.9 kg)     Height 08/31/21 0907 5\' 1"  (1.549 m)     Head Circumference --      Peak Flow --      Pain Score --      Pain Loc --      Pain  Edu? --      Excl. in [place]? --    Constitutional: Awake alert, no distress.  Lying bed comfortably.  C-collar in place. Eyes: Normal exam ENT      Head: Normocephalic and atraumatic.      Mouth/Throat: Mucous membranes are moist. Cardiovascular: Normal rate, regular rhythm.  Respiratory: Normal respiratory effort without tachypnea nor retractions. Breath sounds are clear  Gastrointestinal: Soft and nontender. No distention. Musculoskeletal: Patient appears to have some mild to moderate pain with range of motion of the right lower extremity, does not appear shortened or rotated.  Neuro vastly  intact bilaterally.  Great range of motion all other extremities. Neurologic:  Normal speech and language. No gross focal neurologic deficits  Skin:  Skin is warm, dry and intact.  Psychiatric: Mood and affect are normal.  ____________________________________________    EKG  EKG viewed and interpreted by myself shows a sinus rhythm at 70 bpm with a narrow QRS, normal axis, normal intervals, no concerning ST changes.  ____________________________________________    RADIOLOGY  X-ray negative CT scan of the head and C-spine negative for acute abnormality. CT scan of the right hip is negative for acute abnormality besides likely contusion  ____________________________________________   INITIAL IMPRESSION / ASSESSMENT AND PLAN / ED COURSE  Pertinent labs & imaging results that were available during my care of the patient were reviewed by me and considered in my medical decision making (see chart for details).   Patient to the emergency department as unwitnessed fall from nursing facility.  Patient is on Eliquis, no obvious signs of head trauma however given the unwitnessed status as well as her dementia and aphasia history making a history difficult we will obtain CT imaging of the head and C-spine as a precaution.  Patient does appear to have moderate discomfort with range of motion in her right  hip, neurovascular intact distally.  We will obtain an x-ray as a precaution.  We will continue to closely monitor as well as check basic labs.  X-ray negative the right hip CT scans are negative of the head and neck.  C-collar discontinued.  Patient continues to have some pain with palpation of the right hip we will obtain a CT scan of the hip given difficult history.  CT scan of the hip is negative besides contusion which is likely the cause of the patient's pain.  We will discharge the patient back to her care facility.  Emily Lozano was evaluated in Emergency Department on 08/31/2021 for the symptoms described in the history of present illness. She was evaluated in the context of the global COVID-19 pandemic, which necessitated consideration that the patient might be at risk for infection with the SARS-CoV-2 virus that causes COVID-19. Institutional protocols and algorithms that pertain to the evaluation of patients at risk for COVID-19 are in a state of rapid change based on information released by regulatory bodies including the CDC and federal and state organizations. These policies and algorithms were followed during the patient's care in the ED.  ____________________________________________   FINAL CLINICAL IMPRESSION(S) / ED DIAGNOSES  Fall Contusion   Harvest Dark, MD 08/31/21 1213

## 2021-08-31 NOTE — ED Notes (Signed)
ED Provider at bedside. 

## 2022-02-01 IMAGING — DX DG CHEST 1V PORT
1 series · 1 of 1 positions shown · non-contrast
Comparison: December 02, 2020

CLINICAL DATA: Shortness of breath.  SQREK-2A positive

EXAM:
PORTABLE CHEST 1 VIEW

[chest]
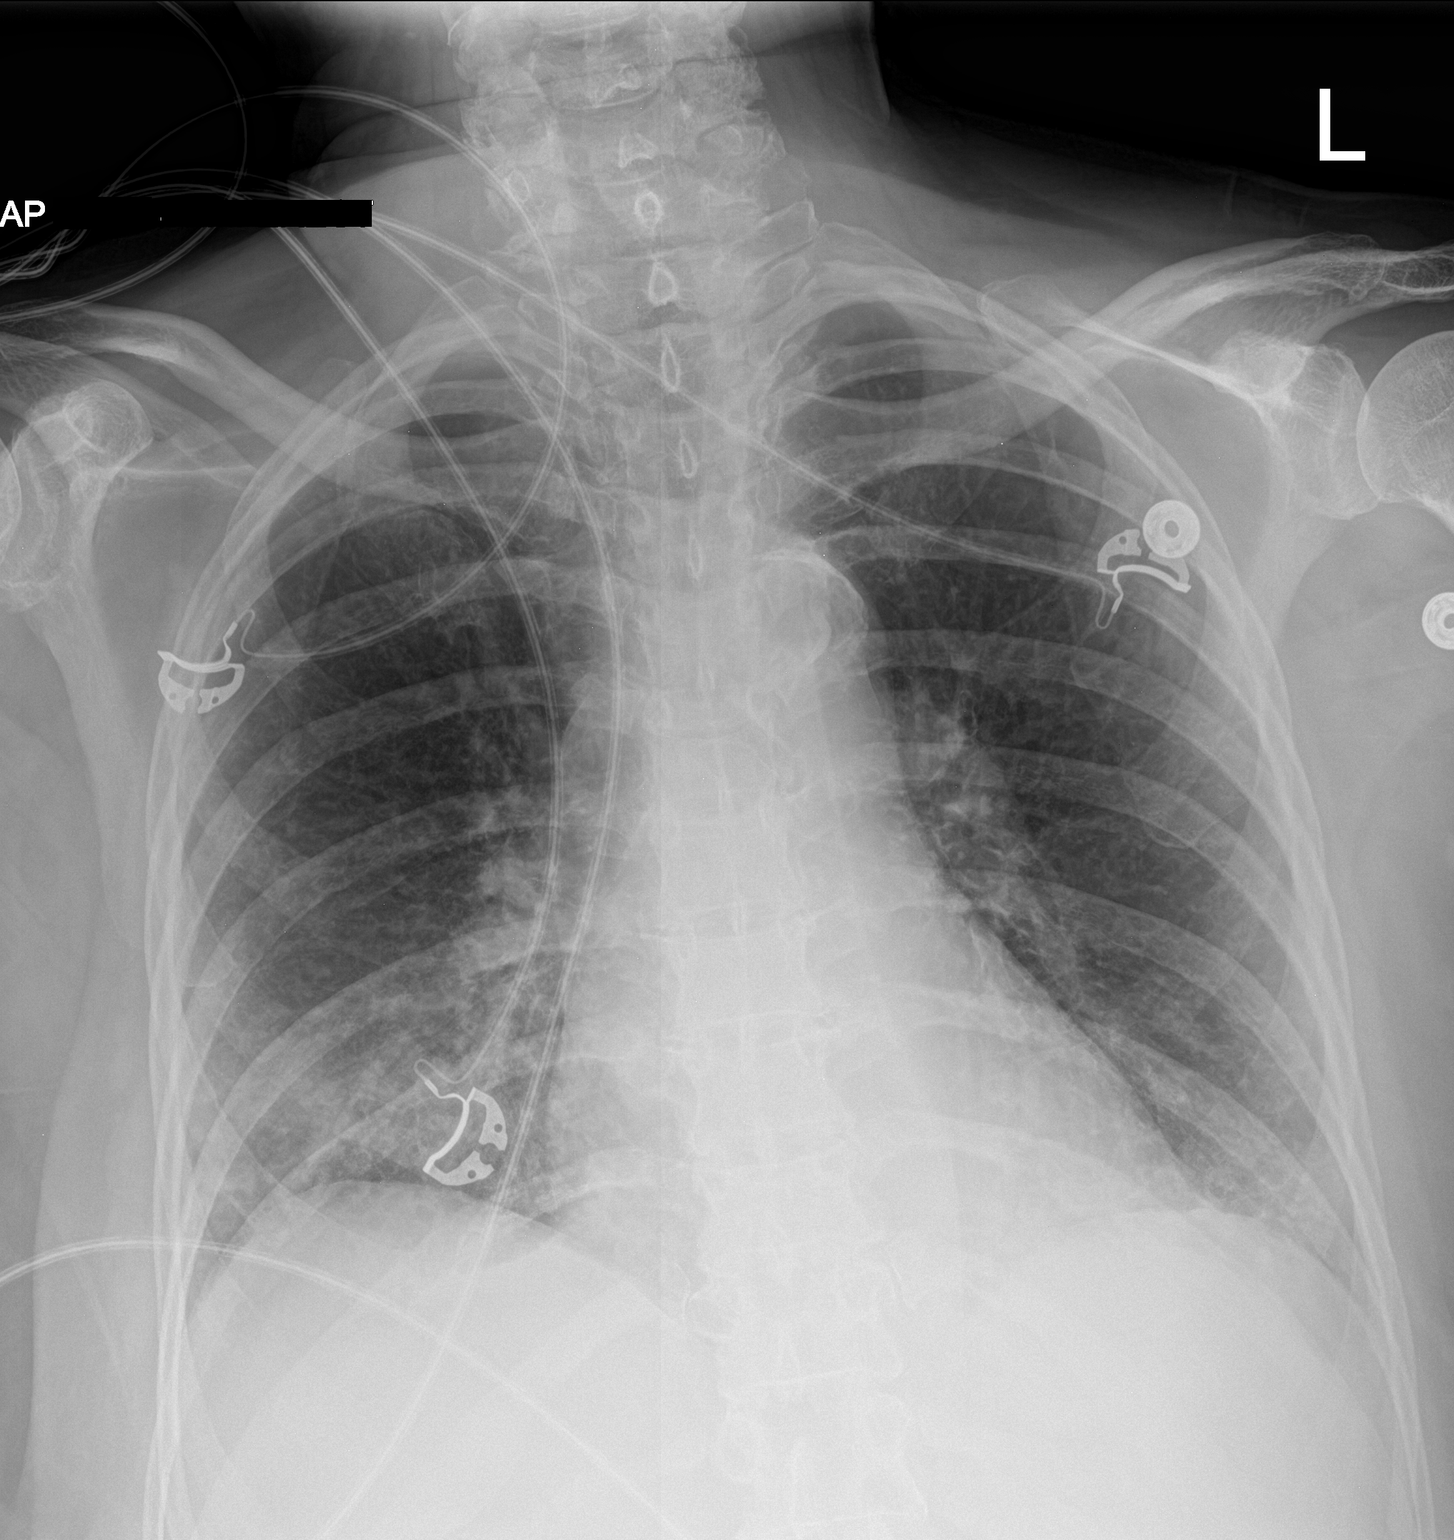

[1 of 1 positions shown; findings below may reference images not displayed]

FINDINGS: Patchy airspace opacity is noted in the lung bases, more on the
right than on the left. Lungs elsewhere clear. Heart is upper normal
in size with pulmonary vascularity normal. No adenopathy. There is
aortic atherosclerosis. There is calcification in each carotid
artery. No bone lesions.
IMPRESSION: Persistent airspace opacity bases, more on the right than the left,
likely due to atypical organism pneumonia. No new opacity evident.
Stable cardiac silhouette.

Aortic Atherosclerosis ([id number]-[id number]). There are foci of carotid
artery calcification bilaterally.

## 2022-02-26 IMAGING — CT CT HEAD W/O CM
3 of 4 series · 13 of 47 positions shown, 15 images · non-contrast
Comparison: Prior head CT examinations 12/06/2020 and earlier.
Brain MRI 11/25/2020.

CLINICAL DATA: Head trauma, minor. Facial trauma. Neck trauma.
Additional provided: Fall on blood thinners.

EXAM:
CT HEAD WITHOUT CONTRAST
CT MAXILLOFACIAL WITHOUT CONTRAST
CT CERVICAL SPINE WITHOUT CONTRAST
TECHNIQUE: Multidetector CT imaging of the head, cervical spine, and
maxillofacial structures were performed using the standard protocol
without intravenous contrast. Multiplanar CT image reconstructions
of the cervical spine and maxillofacial structures were also
generated.

[Series 3: head without · axial · non-contrast · 0.45mm/px · z∈[-71,+49]mm · 7 of 34 slices shown, 9 images]
[im 5/34  brain]
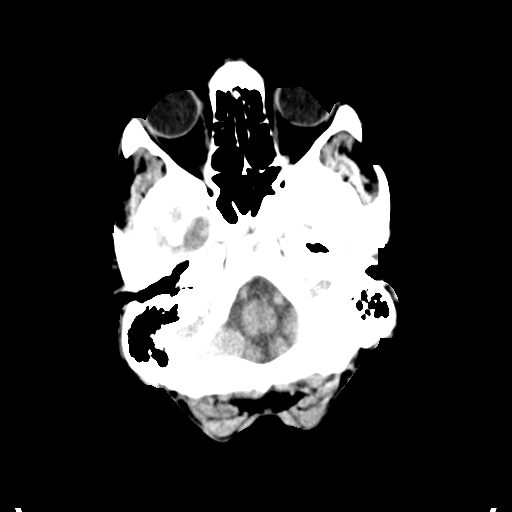
[im 5/34  bone]
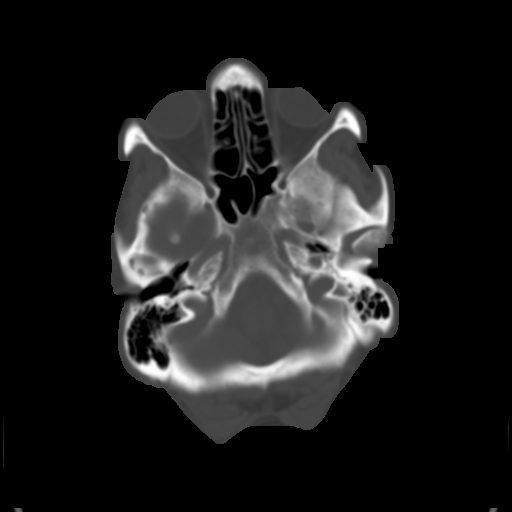
[im 9/34  brain]
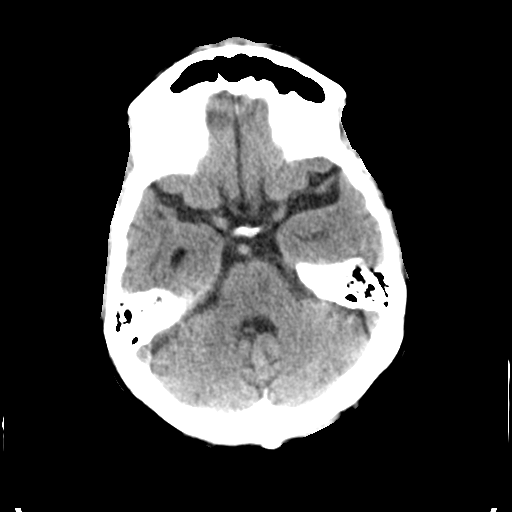
[im 13/34  brain]
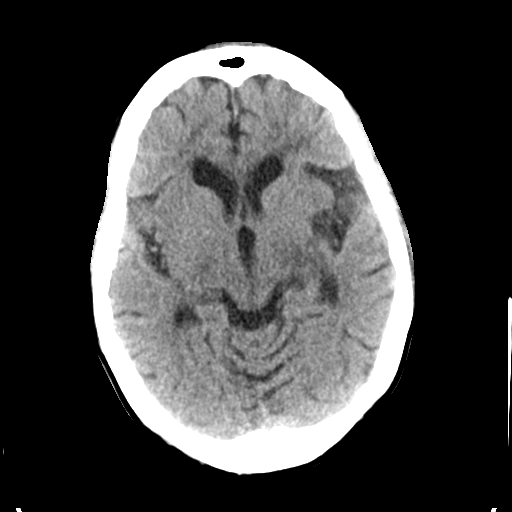
[im 17/34  brain]
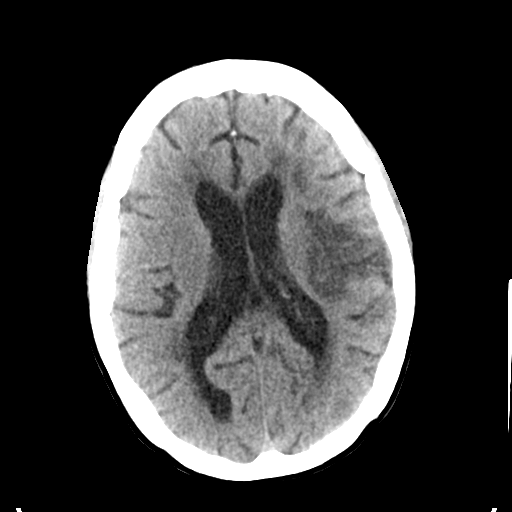
[im 21/34  brain]
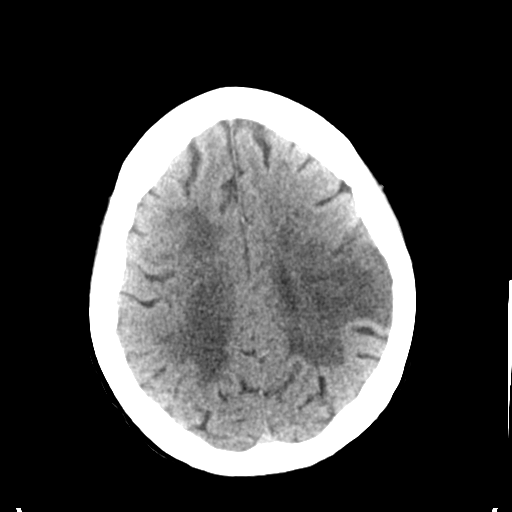
[im 21/34  bone]
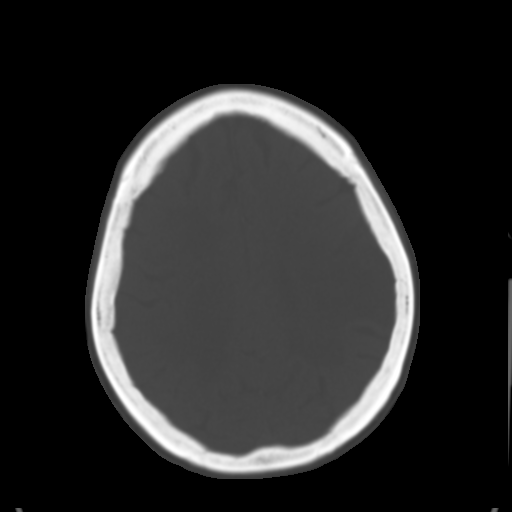
[im 25/34  brain]
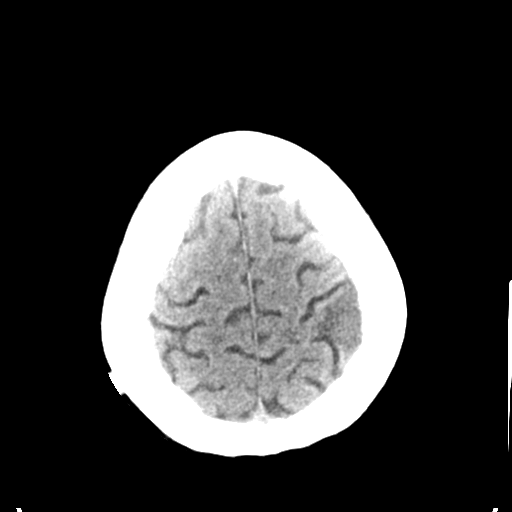
[im 29/34  brain]
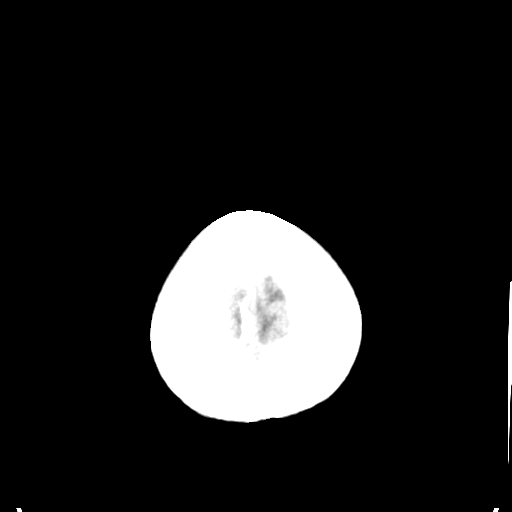

[Series 5: head without cor · coronal · non-contrast · 0.33mm/px · 3 of 70 slices shown]
[im 24/70  brain]
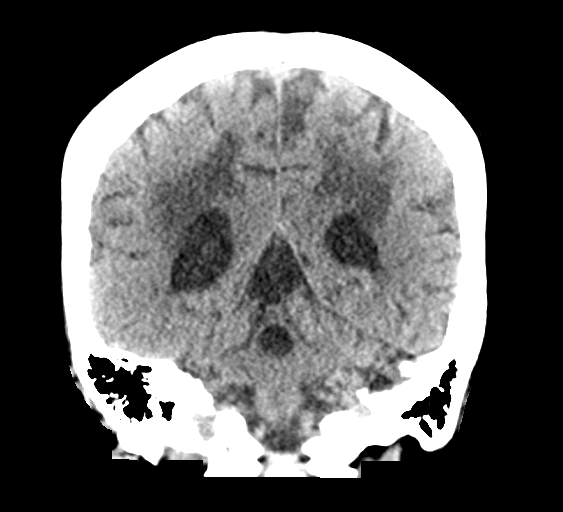
[im 31/70  brain]
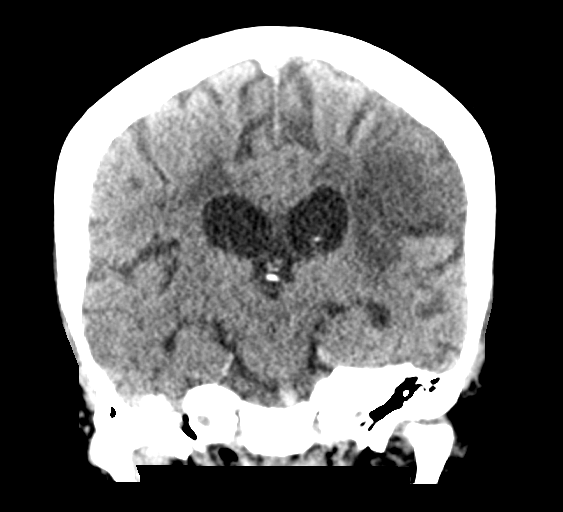
[im 39/70  brain]
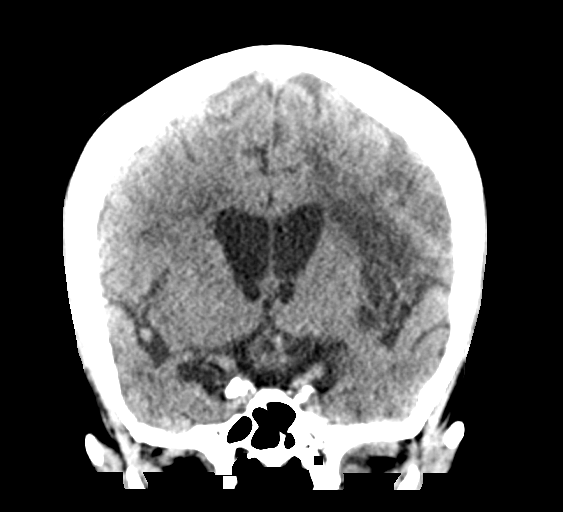

[Series 6: head without sag · sagittal · non-contrast · 0.34mm/px · 3 of 65 slices shown]
[im 22/65  brain]
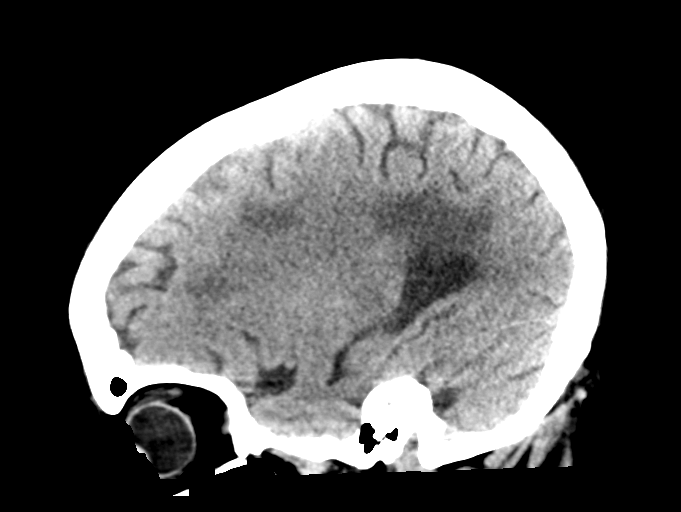
[im 33/65  brain]
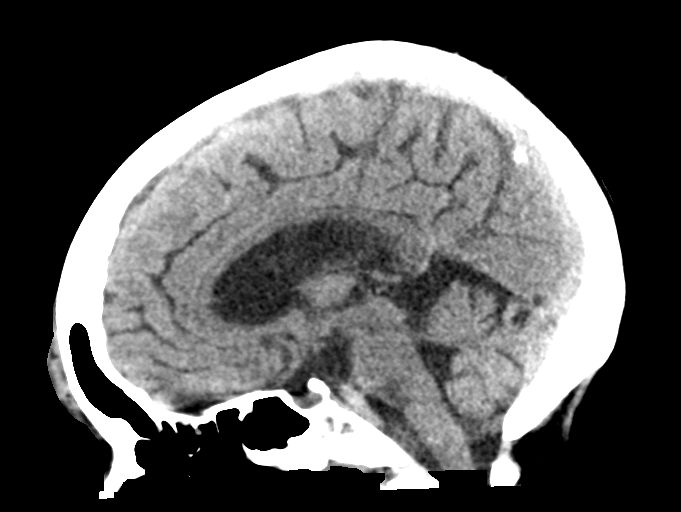
[im 43/65  brain]
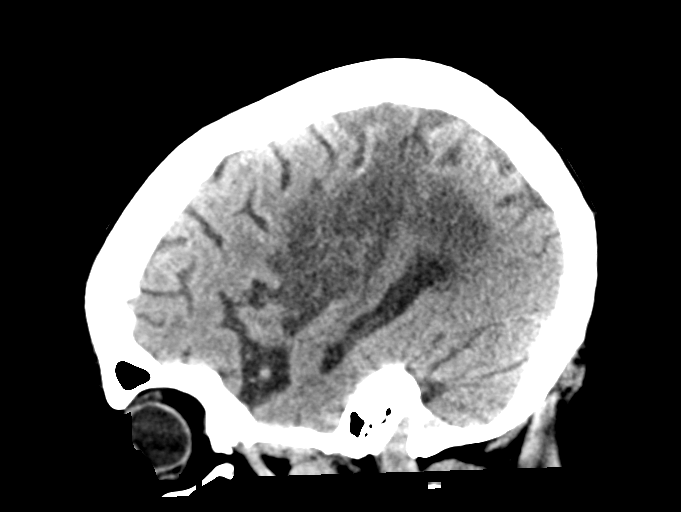

[13 of 47 positions shown; findings below may reference images not displayed]

FINDINGS: CT HEAD FINDINGS

Brain:

Mild cerebral and cerebellar atrophy.

Expected evolution of a large subacute cortical/subcortical left MCA
territory infarct. Subtle cortical laminar necrosis within the
infarction territory.

Advanced patchy and ill-defined hypoattenuation within the cerebral
white matter is nonspecific, but compatible with chronic small
vessel ischemic disease.

There is no acute intracranial hemorrhage.

No extra-axial fluid collection.

No evidence of intracranial mass.

No midline shift.

Vascular: No hyperdense vessel.  Atherosclerotic calcifications.

Skull: Normal. Negative for fracture or focal lesion.

Other: No significant mastoid effusion. Prominent forehead hematoma
extending to the nasal bridge.

CT MAXILLOFACIAL FINDINGS

Osseous: No acute maxillofacial fracture is identified.

Orbits: No acute finding within the orbits. The globes are normal in
size and contour. The extraocular muscles and optic nerve sheath
complexes are symmetric and unremarkable.

Sinuses: Trace mucosal thickening within the bilateral ethmoid and
maxillary sinuses. Small mucous retention cysts within the left
sphenoid and left maxillary sinuses.

Soft tissues: Prominent forehead hematoma extending to the nasal
bridge. Nonspecific 6 mm focus of calcification within the right
subcutaneous fat overlying the right parotid gland.

CT CERVICAL SPINE FINDINGS

Alignment: Straightening of the expected cervical lordosis. No
significant spondylolisthesis.

Skull base and vertebrae: The basion-dental and atlanto-dental
intervals are maintained.No evidence of acute fracture to the
cervical spine.

Soft tissues and spinal canal: No prevertebral fluid or swelling. No
visible canal hematoma.

Disc levels: Cervical spondylosis. Mild disc space narrowing at
C6-C7. Shallow multilevel disc bulges and central disc protrusions.
Multilevel uncovertebral hypertrophy and facet arthrosis. No more
than mild appreciable spinal canal narrowing. Multilevel bony neural
foraminal narrowing. Possible early facet joint ankylosis on the
right at C2-C3.

Upper chest: Interlobular septal thickening and patchy ground-glass
opacity within the right greater than left lung apices. This is
nonspecific, but may reflect edema or infection. No visible
pneumothorax.

Other: 12 mm right thyroid lobe nodule not meeting consensus
criteria for ultrasound follow-up.
IMPRESSION: CT head:

1. No acute posttraumatic intracranial findings.
2. Prominent forehead hematoma extending to the nasal bridge.
3. Expected evolution of a large subacute left MCA territory
ischemic infarct.
4. Stable generalized parenchymal atrophy and background advanced
chronic small vessel ischemic disease.

CT maxillofacial:

1. No evidence of acute maxillofacial fracture.
2. Prominent forehead hematoma extending to the nasal bridge.
3. Mild paranasal sinus disease, as described.

CT cervical spine:

1. No evidence of acute fracture to the cervical spine.
2. Nonspecific straightening of the expected cervical lordosis.
3. Cervical spondylosis as described.
4. Interlobular septal thickening and patchy ground-glass opacity
within the imaged right greater than left lung apices. Findings are
nonspecific, but may reflect edema or infection.

## 2022-02-26 IMAGING — CT CT CERVICAL SPINE W/O CM
3 of 4 series · 13 of 33 positions shown, 16 images · non-contrast
Comparison: Prior head CT examinations 12/06/2020 and earlier.
Brain MRI 11/25/2020.

CLINICAL DATA: Head trauma, minor. Facial trauma. Neck trauma.
Additional provided: Fall on blood thinners.

EXAM:
CT HEAD WITHOUT CONTRAST
CT MAXILLOFACIAL WITHOUT CONTRAST
CT CERVICAL SPINE WITHOUT CONTRAST
TECHNIQUE: Multidetector CT imaging of the head, cervical spine, and
maxillofacial structures were performed using the standard protocol
without intravenous contrast. Multiplanar CT image reconstructions
of the cervical spine and maxillofacial structures were also
generated.

[Series 4: c_spine 2.0 orthogonals · axial · 0.21mm/px · z∈[-238,-150]mm · 5 of 92 slices shown, 7 images]
[im 16/92  soft-tissue]
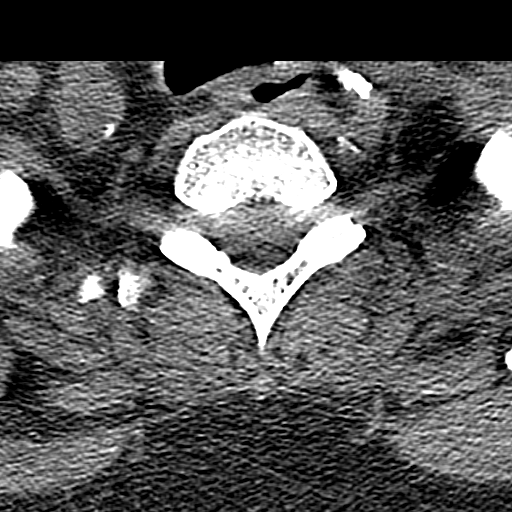
[im 16/92  bone]
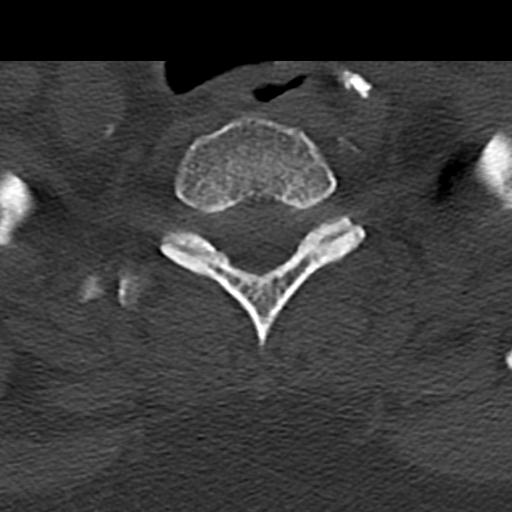
[im 31/92  bone]
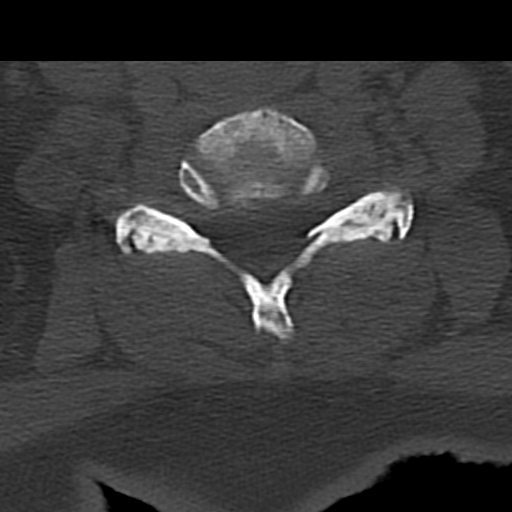
[im 46/92  bone]
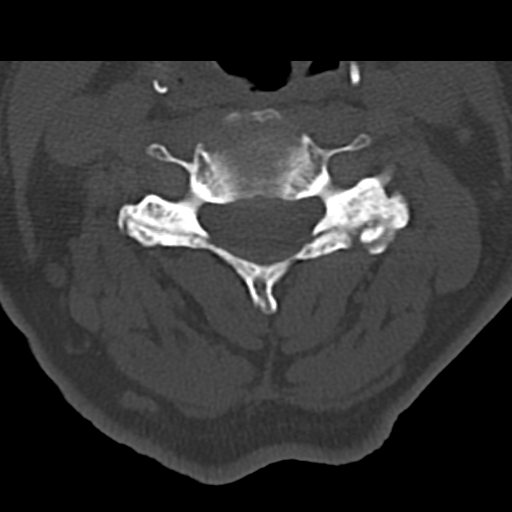
[im 61/92  bone]
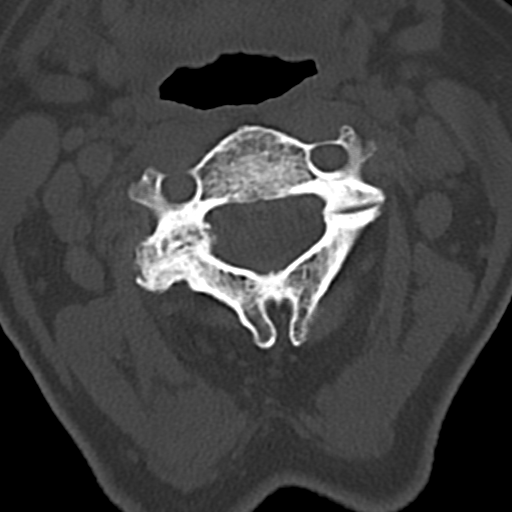
[im 76/92  soft-tissue]
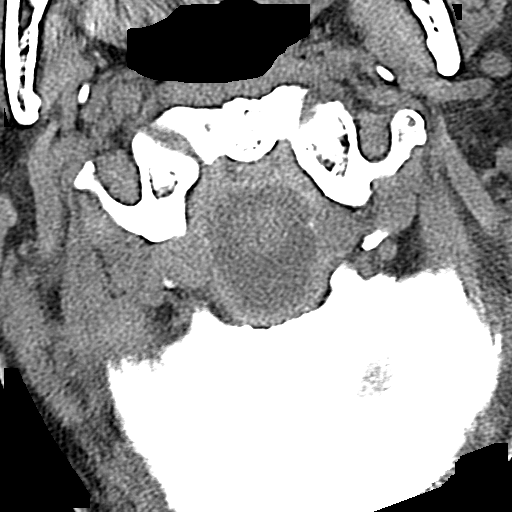
[im 76/92  bone]
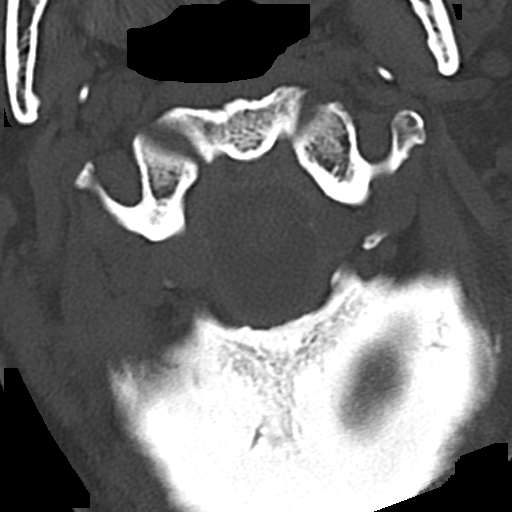

[Series 9: c_spine 2.0 sag bone · sagittal · 0.35mm/px · 5 of 55 slices shown, 6 images]
[im 19/55  bone]
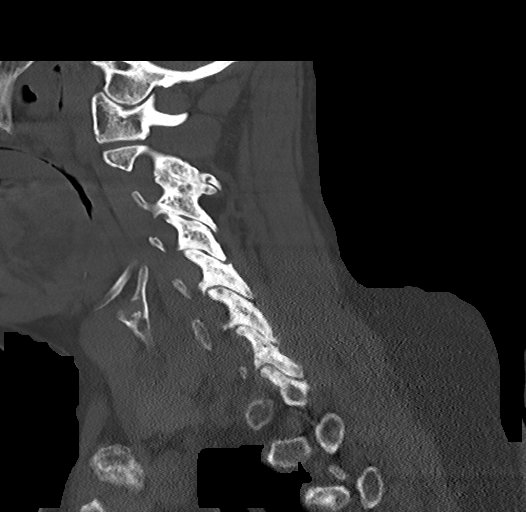
[im 23/55  bone]
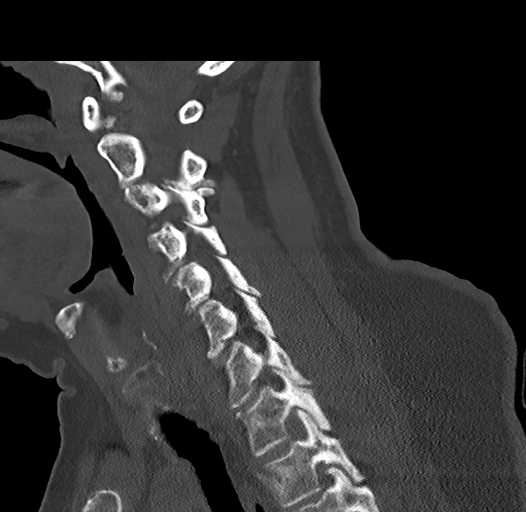
[im 28/55  soft-tissue]
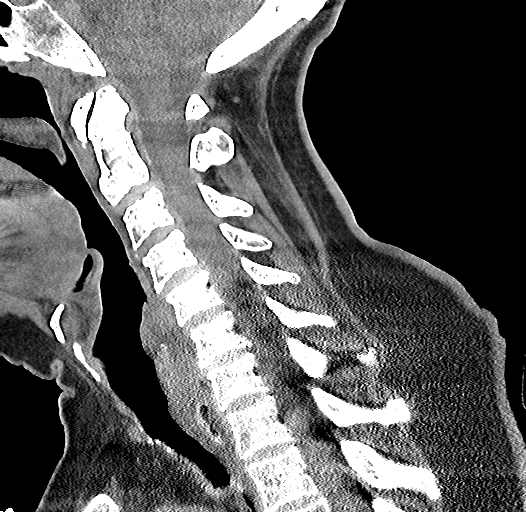
[im 28/55  bone]
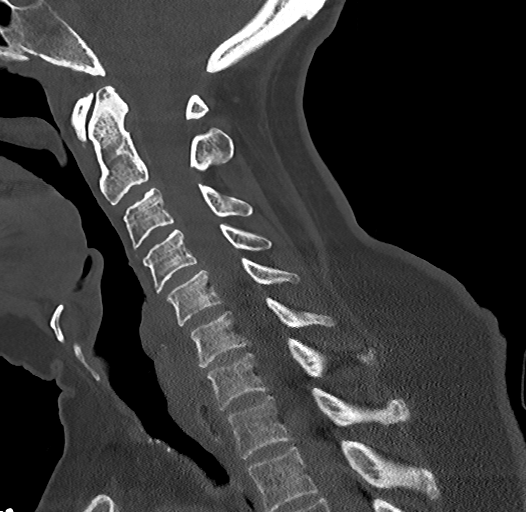
[im 32/55  bone]
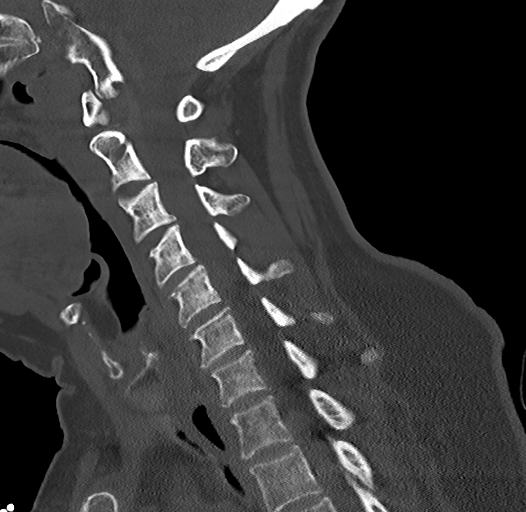
[im 37/55  bone]
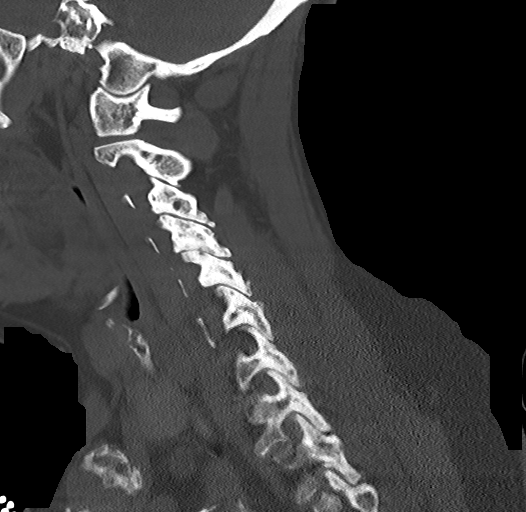

[Series 10: c_spine 2.0 cor bone · coronal · 0.26mm/px · 3 of 61 slices shown]
[im 13/61  bone]
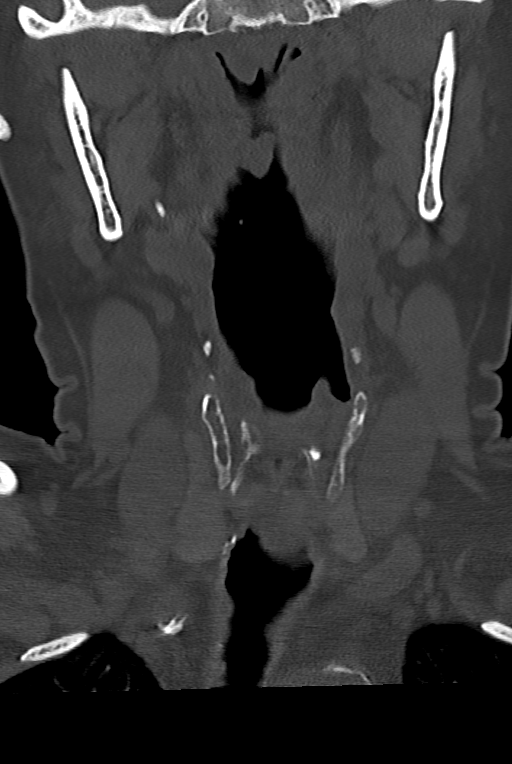
[im 25/61  bone]
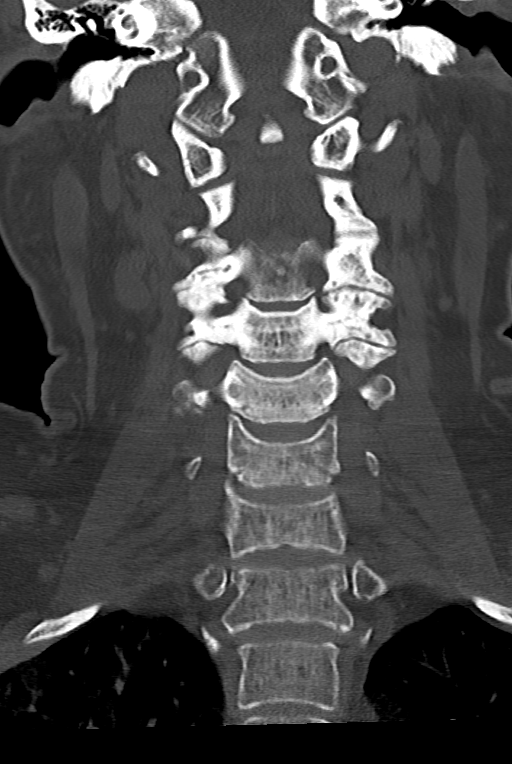
[im 37/61  bone]
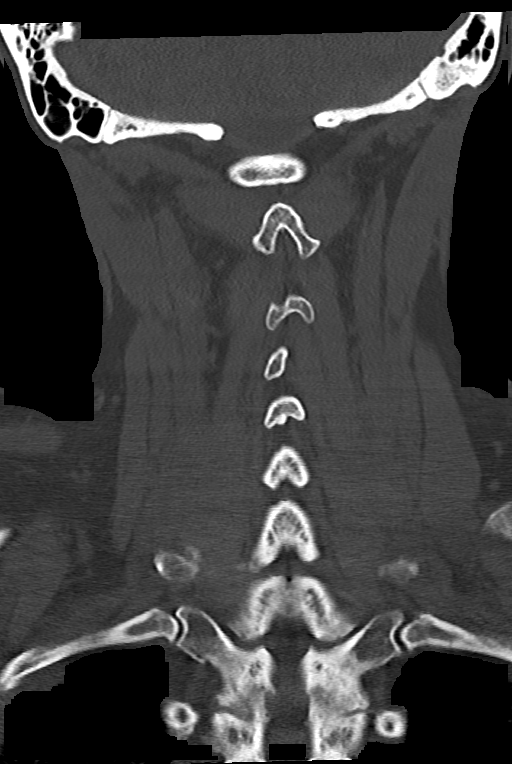

[13 of 33 positions shown; findings below may reference images not displayed]

FINDINGS: CT HEAD FINDINGS

Brain:

Mild cerebral and cerebellar atrophy.

Expected evolution of a large subacute cortical/subcortical left MCA
territory infarct. Subtle cortical laminar necrosis within the
infarction territory.

Advanced patchy and ill-defined hypoattenuation within the cerebral
white matter is nonspecific, but compatible with chronic small
vessel ischemic disease.

There is no acute intracranial hemorrhage.

No extra-axial fluid collection.

No evidence of intracranial mass.

No midline shift.

Vascular: No hyperdense vessel.  Atherosclerotic calcifications.

Skull: Normal. Negative for fracture or focal lesion.

Other: No significant mastoid effusion. Prominent forehead hematoma
extending to the nasal bridge.

CT MAXILLOFACIAL FINDINGS

Osseous: No acute maxillofacial fracture is identified.

Orbits: No acute finding within the orbits. The globes are normal in
size and contour. The extraocular muscles and optic nerve sheath
complexes are symmetric and unremarkable.

Sinuses: Trace mucosal thickening within the bilateral ethmoid and
maxillary sinuses. Small mucous retention cysts within the left
sphenoid and left maxillary sinuses.

Soft tissues: Prominent forehead hematoma extending to the nasal
bridge. Nonspecific 6 mm focus of calcification within the right
subcutaneous fat overlying the right parotid gland.

CT CERVICAL SPINE FINDINGS

Alignment: Straightening of the expected cervical lordosis. No
significant spondylolisthesis.

Skull base and vertebrae: The basion-dental and atlanto-dental
intervals are maintained.No evidence of acute fracture to the
cervical spine.

Soft tissues and spinal canal: No prevertebral fluid or swelling. No
visible canal hematoma.

Disc levels: Cervical spondylosis. Mild disc space narrowing at
C6-C7. Shallow multilevel disc bulges and central disc protrusions.
Multilevel uncovertebral hypertrophy and facet arthrosis. No more
than mild appreciable spinal canal narrowing. Multilevel bony neural
foraminal narrowing. Possible early facet joint ankylosis on the
right at C2-C3.

Upper chest: Interlobular septal thickening and patchy ground-glass
opacity within the right greater than left lung apices. This is
nonspecific, but may reflect edema or infection. No visible
pneumothorax.

Other: 12 mm right thyroid lobe nodule not meeting consensus
criteria for ultrasound follow-up.
IMPRESSION: CT head:

1. No acute posttraumatic intracranial findings.
2. Prominent forehead hematoma extending to the nasal bridge.
3. Expected evolution of a large subacute left MCA territory
ischemic infarct.
4. Stable generalized parenchymal atrophy and background advanced
chronic small vessel ischemic disease.

CT maxillofacial:

1. No evidence of acute maxillofacial fracture.
2. Prominent forehead hematoma extending to the nasal bridge.
3. Mild paranasal sinus disease, as described.

CT cervical spine:

1. No evidence of acute fracture to the cervical spine.
2. Nonspecific straightening of the expected cervical lordosis.
3. Cervical spondylosis as described.
4. Interlobular septal thickening and patchy ground-glass opacity
within the imaged right greater than left lung apices. Findings are
nonspecific, but may reflect edema or infection.

## 2022-02-26 IMAGING — CT CT MAXILLOFACIAL W/O CM
3 series · 14 of 47 positions shown, 16 images · non-contrast
Comparison: Prior head CT examinations 12/06/2020 and earlier.
Brain MRI 11/25/2020.

CLINICAL DATA: Head trauma, minor. Facial trauma. Neck trauma.
Additional provided: Fall on blood thinners.

EXAM:
CT HEAD WITHOUT CONTRAST
CT MAXILLOFACIAL WITHOUT CONTRAST
CT CERVICAL SPINE WITHOUT CONTRAST
TECHNIQUE: Multidetector CT imaging of the head, cervical spine, and
maxillofacial structures were performed using the standard protocol
without intravenous contrast. Multiplanar CT image reconstructions
of the cervical spine and maxillofacial structures were also
generated.

[Series 3: facialbone 2.0 st · axial · 0.36mm/px · z∈[-196,-36]mm · 8 of 94 slices shown, 10 images]
[im 7/94  brain]
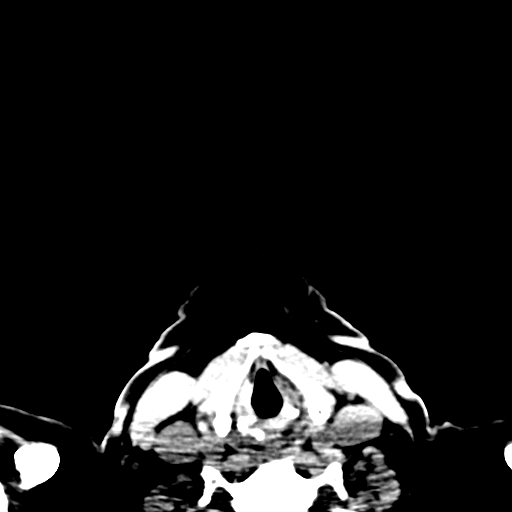
[im 7/94  bone]
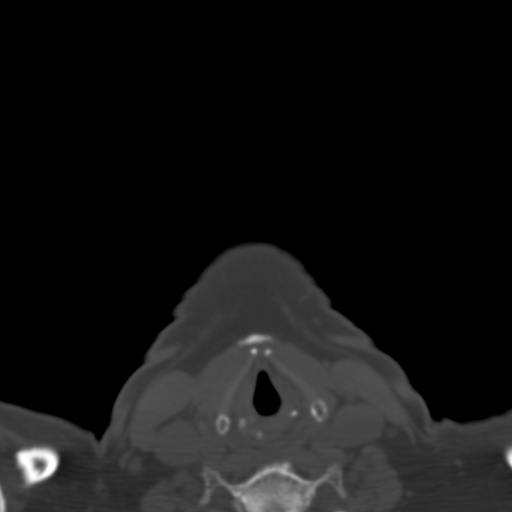
[im 20/94  bone]
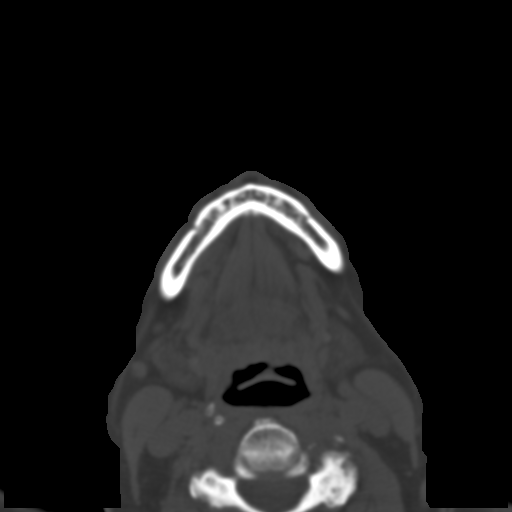
[im 29/94  bone]
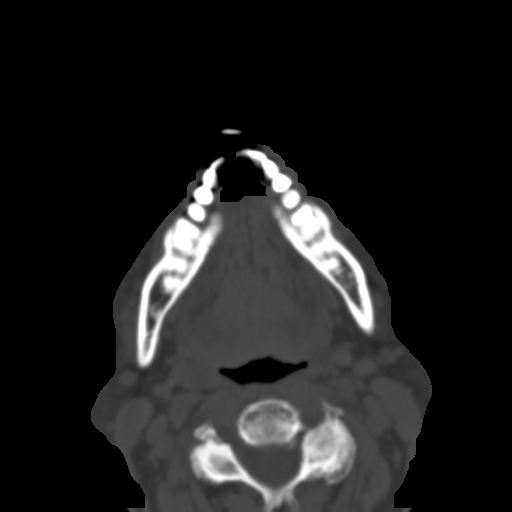
[im 42/94  bone]
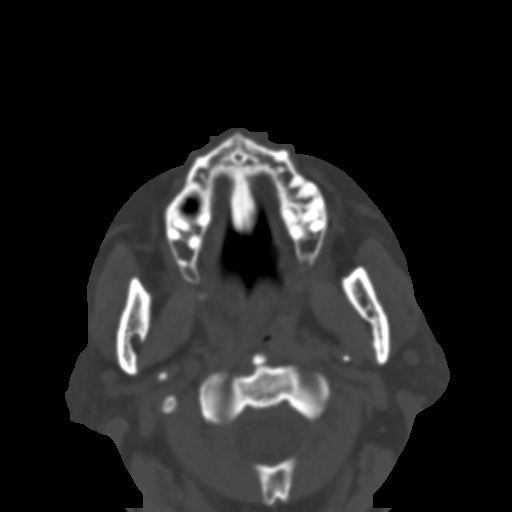
[im 52/94  brain]
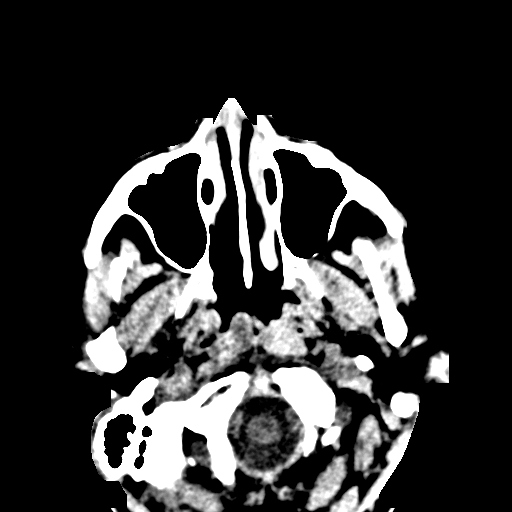
[im 52/94  bone]
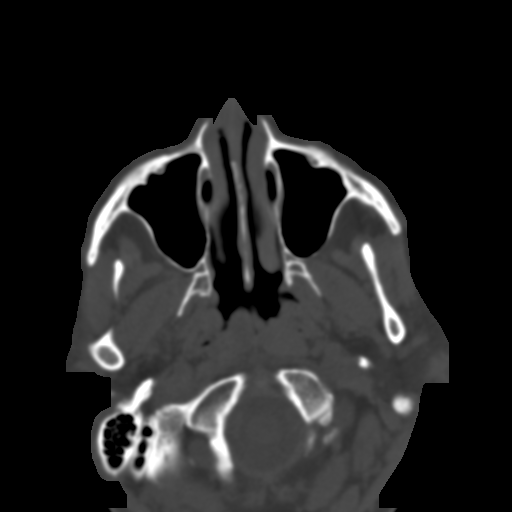
[im 65/94  bone]
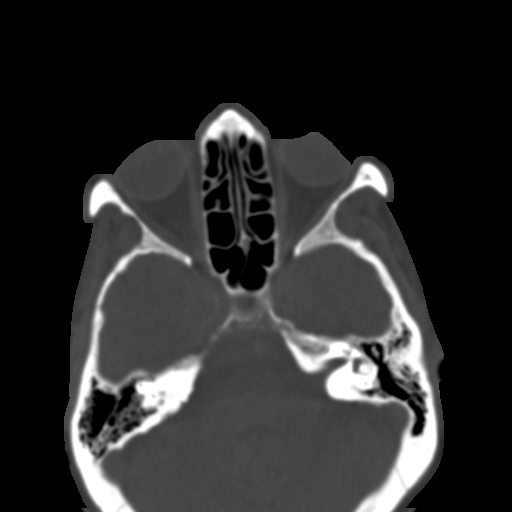
[im 74/94  bone]
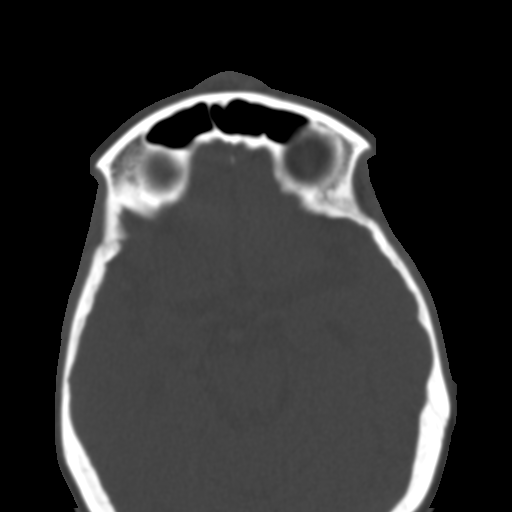
[im 87/94  bone]
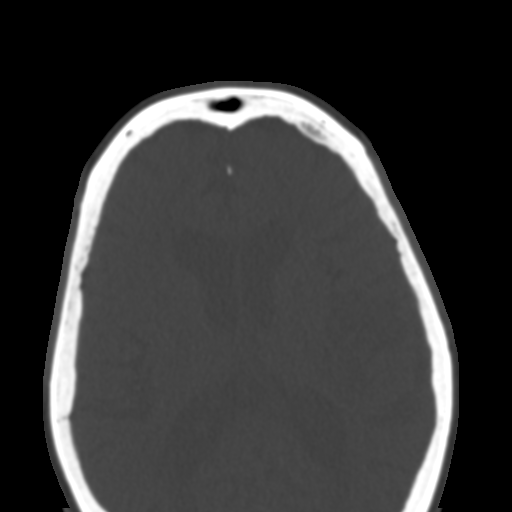

[Series 9: facialbone 2.0 cor st · coronal · 0.38mm/px · 3 of 90 slices shown]
[im 30/90  bone]
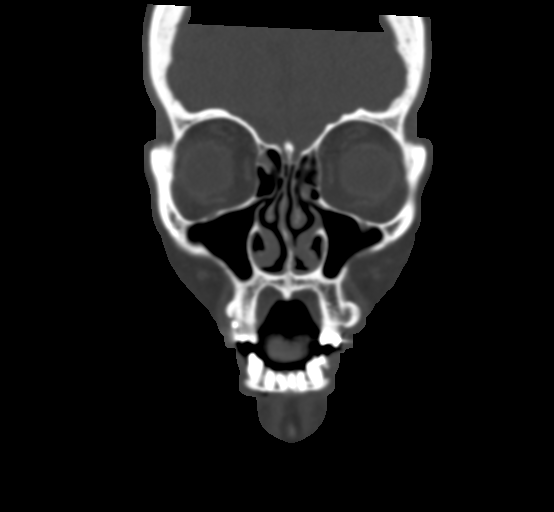
[im 40/90  bone]
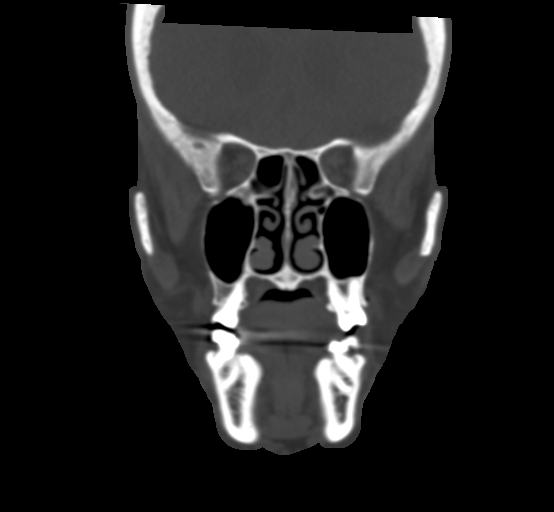
[im 50/90  bone]
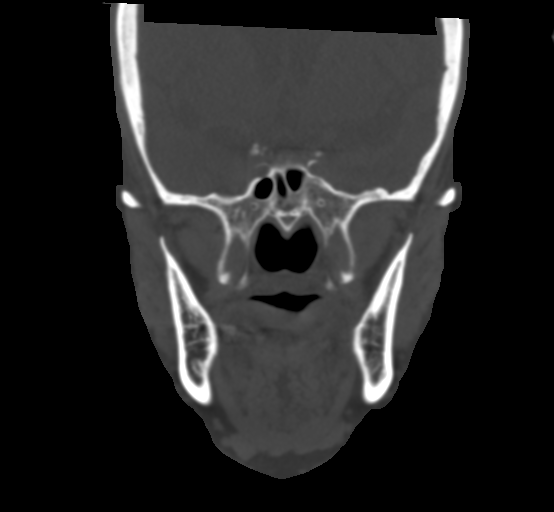

[Series 10: facialbone 2.0 sag st · sagittal · 0.38mm/px · 3 of 89 slices shown]
[im 30/89  bone]
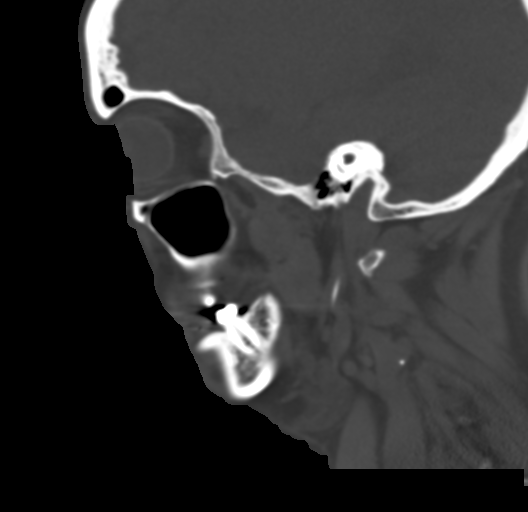
[im 45/89  bone]
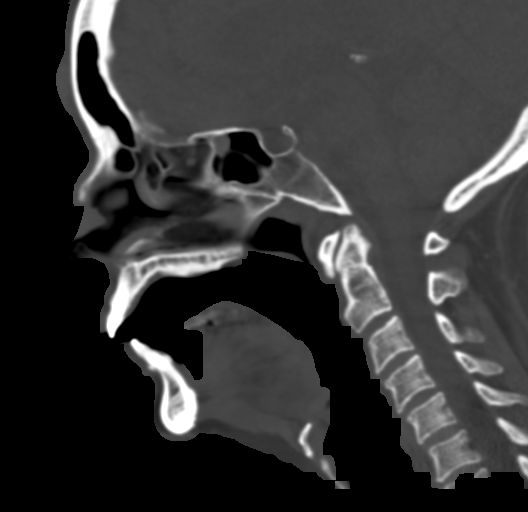
[im 59/89  bone]
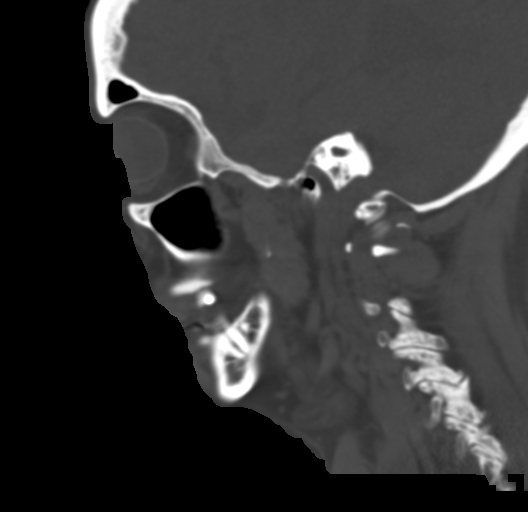

[14 of 47 positions shown; findings below may reference images not displayed]

FINDINGS: CT HEAD FINDINGS

Brain:

Mild cerebral and cerebellar atrophy.

Expected evolution of a large subacute cortical/subcortical left MCA
territory infarct. Subtle cortical laminar necrosis within the
infarction territory.

Advanced patchy and ill-defined hypoattenuation within the cerebral
white matter is nonspecific, but compatible with chronic small
vessel ischemic disease.

There is no acute intracranial hemorrhage.

No extra-axial fluid collection.

No evidence of intracranial mass.

No midline shift.

Vascular: No hyperdense vessel.  Atherosclerotic calcifications.

Skull: Normal. Negative for fracture or focal lesion.

Other: No significant mastoid effusion. Prominent forehead hematoma
extending to the nasal bridge.

CT MAXILLOFACIAL FINDINGS

Osseous: No acute maxillofacial fracture is identified.

Orbits: No acute finding within the orbits. The globes are normal in
size and contour. The extraocular muscles and optic nerve sheath
complexes are symmetric and unremarkable.

Sinuses: Trace mucosal thickening within the bilateral ethmoid and
maxillary sinuses. Small mucous retention cysts within the left
sphenoid and left maxillary sinuses.

Soft tissues: Prominent forehead hematoma extending to the nasal
bridge. Nonspecific 6 mm focus of calcification within the right
subcutaneous fat overlying the right parotid gland.

CT CERVICAL SPINE FINDINGS

Alignment: Straightening of the expected cervical lordosis. No
significant spondylolisthesis.

Skull base and vertebrae: The basion-dental and atlanto-dental
intervals are maintained.No evidence of acute fracture to the
cervical spine.

Soft tissues and spinal canal: No prevertebral fluid or swelling. No
visible canal hematoma.

Disc levels: Cervical spondylosis. Mild disc space narrowing at
C6-C7. Shallow multilevel disc bulges and central disc protrusions.
Multilevel uncovertebral hypertrophy and facet arthrosis. No more
than mild appreciable spinal canal narrowing. Multilevel bony neural
foraminal narrowing. Possible early facet joint ankylosis on the
right at C2-C3.

Upper chest: Interlobular septal thickening and patchy ground-glass
opacity within the right greater than left lung apices. This is
nonspecific, but may reflect edema or infection. No visible
pneumothorax.

Other: 12 mm right thyroid lobe nodule not meeting consensus
criteria for ultrasound follow-up.
IMPRESSION: CT head:

1. No acute posttraumatic intracranial findings.
2. Prominent forehead hematoma extending to the nasal bridge.
3. Expected evolution of a large subacute left MCA territory
ischemic infarct.
4. Stable generalized parenchymal atrophy and background advanced
chronic small vessel ischemic disease.

CT maxillofacial:

1. No evidence of acute maxillofacial fracture.
2. Prominent forehead hematoma extending to the nasal bridge.
3. Mild paranasal sinus disease, as described.

CT cervical spine:

1. No evidence of acute fracture to the cervical spine.
2. Nonspecific straightening of the expected cervical lordosis.
3. Cervical spondylosis as described.
4. Interlobular septal thickening and patchy ground-glass opacity
within the imaged right greater than left lung apices. Findings are
nonspecific, but may reflect edema or infection.

## 2022-02-27 ENCOUNTER — Emergency Department
Admission: EM | Admit: 2022-02-27 | Discharge: 2022-02-27 | Disposition: A | Discharge status: Home / Self Care | Payer: Medicare Other | Attending: Emergency Medicine | Admitting: Emergency Medicine

## 2022-02-27 ENCOUNTER — Emergency Department: Payer: Medicare Other

## 2022-02-27 ENCOUNTER — Encounter: Payer: Self-pay | Admitting: Emergency Medicine

## 2022-02-27 DIAGNOSIS — W19XXXA Unspecified fall, initial encounter: Secondary | ICD-10-CM

## 2022-02-27 DIAGNOSIS — R4182 Altered mental status, unspecified: Secondary | ICD-10-CM | POA: Insufficient documentation

## 2022-02-27 DIAGNOSIS — R799 Abnormal finding of blood chemistry, unspecified: Secondary | ICD-10-CM | POA: Diagnosis not present

## 2022-02-27 LAB — URINALYSIS, ROUTINE W REFLEX MICROSCOPIC
Bilirubin Urine: NEGATIVE
Glucose, UA: NEGATIVE mg/dL
Hgb urine dipstick: NEGATIVE
Ketones, ur: NEGATIVE mg/dL
Nitrite: NEGATIVE
Protein, ur: NEGATIVE mg/dL
Specific Gravity, Urine: 1.024 (ref 1.005–1.030)
WBC, UA: >50 WBC/hpf — ABNORMAL HIGH (ref 0–5)
pH: 5 (ref 5.0–8.0)

## 2022-02-27 LAB — COMPREHENSIVE METABOLIC PANEL
ALT: 9 U/L (ref 0–44)
AST: 12 U/L — ABNORMAL LOW (ref 15–41)
Albumin: 3.3 g/dL — ABNORMAL LOW (ref 3.5–5.0)
Alkaline Phosphatase: 53 U/L (ref 38–126)
Anion gap: 5 (ref 5–15)
BUN: 15 mg/dL (ref 8–23)
CO2: 26 mmol/L (ref 22–32)
Calcium: 8.9 mg/dL (ref 8.9–10.3)
Chloride: 110 mmol/L (ref 98–111)
Creatinine, Ser: 0.58 mg/dL (ref 0.44–1.00)
GFR, Estimated: >60 mL/min (ref >60)
Glucose, Bld: 155 mg/dL — ABNORMAL HIGH (ref 70–99)
Potassium: 3.8 mmol/L (ref 3.5–5.1)
Sodium: 141 mmol/L (ref 135–145)
Total Bilirubin: 0.4 mg/dL (ref 0.3–1.2)
Total Protein: 6.9 g/dL (ref 6.5–8.1)

## 2022-02-27 LAB — CBC
HCT: 38.7 % (ref 36.0–46.0)
Hemoglobin: 12 g/dL (ref 12.0–15.0)
MCH: 24 pg — ABNORMAL LOW (ref 26.0–34.0)
MCHC: 31 g/dL (ref 30.0–36.0)
MCV: 77.6 fL — ABNORMAL LOW (ref 80.0–100.0)
Platelets: 255 10*3/uL (ref 150–400)
RBC: 4.99 MIL/uL (ref 3.87–5.11)
RDW: 16.6 % — ABNORMAL HIGH (ref 11.5–15.5)
WBC: 7.4 10*3/uL (ref 4.0–10.5)
nRBC: 0 % (ref 0.0–0.2)

## 2022-02-27 LAB — CBG MONITORING, ED: Glucose-Capillary: 148 mg/dL — ABNORMAL HIGH (ref 70–99)

## 2022-02-27 LAB — TROPONIN I (HIGH SENSITIVITY): Troponin I (High Sensitivity): 4 ng/L (ref <18)

## 2022-02-27 NOTE — ED Provider Notes (Signed)
[facility] Provider Note    Event Date/Time   First MD Initiated Contact with Patient 02/27/22 2204     (approximate)   History   Altered Mental Status   HPI  Emily Lozano is a 71 y.o. female  who presents to the emergency department today via EMS from facility because of concern for altered mental status.  The patient herself cannot give any significant history.  I did obtain history from family at bedside.  Apparently the patient had a fall last night out of her bed.  However when family visited her today they stated that she was acting her normal self.  Family stated they were with her this evening and she was her normal self.  They also have not appreciated any altered mental status while they have been with her in the emergency department.  They are unsure why she was sent to the emergency department.      Physical Exam   Triage Vital Signs: ED Triage Vitals  Enc Vitals Group     BP 02/27/22 2104 (!) 170/95     Pulse Rate 02/27/22 2104 65     Resp 02/27/22 2104 20     Temp 02/27/22 2104 98.6 F (37 C)     Temp Source 02/27/22 2104 Oral     SpO2 02/27/22 2103 97 %     Weight 02/27/22 2107 145 lb 8.1 oz (66 kg)     Height 02/27/22 2107 '5\' 1"'$  (1.549 m)     Head Circumference --      Peak Flow --      Pain Score 02/27/22 2106 0   Most recent vital signs: Vitals:   02/27/22 2103 02/27/22 2104  BP:  (!) 170/95  Pulse:  65  Resp:  20  Temp:  98.6 F (37 C)  SpO2: 97% 98%   General: Awake, alert CV:  Good peripheral perfusion.Regular rate and rhythm. Resp:  Normal effort.  Abd:  No distention.  Other:  No tenderness to palpation of the patient's extremities.      ED Results / Procedures / Treatments   Labs (all labs ordered are listed, but only abnormal results are displayed) Labs Reviewed  COMPREHENSIVE METABOLIC PANEL - Abnormal; Notable for the following components:      Result Value   Glucose, Bld 155 (*)    Albumin 3.3  (*)    AST 12 (*)    All other components within normal limits  CBC - Abnormal; Notable for the following components:   MCV 77.6 (*)    MCH 24.0 (*)    RDW 16.6 (*)    All other components within normal limits  CBG MONITORING, ED - Abnormal; Notable for the following components:   Glucose-Capillary 148 (*)    All other components within normal limits  URINALYSIS, ROUTINE W REFLEX MICROSCOPIC  PROTIME-INR  TROPONIN I (HIGH SENSITIVITY)     EKG  I, Nance Pear, attending physician, personally viewed and interpreted this EKG  EKG Time: 2106 Rate: 67 Rhythm: sinus rhythm Axis: left axis deviation Intervals: qtc 464 QRS: narrow, q waves v1, v2, v3 ST changes: no st elevation Impression: abnormal ekg  RADIOLOGY I independently interpreted and visualized the CT head/cervical spine. My interpretation: No bleed. Old left sided stroke. Radiology interpretation:  IMPRESSION:  1.  No acute intracranial abnormality.     2. 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  3.  No acute cervical spine fracture or traumatic subluxation.     4. 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        PROCEDURES:  Critical Care performed: No  Procedures   MEDICATIONS ORDERED IN ED: Medications - No data to display   IMPRESSION / MDM / [place] / ED COURSE  I reviewed the triage vital signs and the nursing notes.                              Differential diagnosis includes, but is not limited to, infection, cva, intracranial bleed, electrolyte abnormality.  Patient's presentation is most consistent with acute presentation with potential threat to life or bodily function.  Patient presented to the emergency department today because of concerns for altered mental status after reported fall last night.  Per family they have not noticed any altered  mental status. however given facility report head CT and cervical spine CT were obtained given fall.  This did not show any concerning intracranial process or acute osseous process.  Additionally blood work was checked which did not show any concerning electrolyte abnormality or leukocytosis.  I did have a discussion with family that urinary tract fractions cannot occasionally cause altered mental status.  However at this time given that she is at her baseline for them here in the emergency department they felt comfortable deferring UA.  I think this is reasonable.  Discussed that they could follow-up with facility if there is further concerns.   FINAL CLINICAL IMPRESSION(S) / ED DIAGNOSES   Final diagnoses:  Fall, initial encounter     Note:  This document was prepared using Dragon voice recognition software and may include unintentional dictation errors.    Nance Pear, MD 02/27/22 2250

## 2022-02-27 NOTE — Discharge Instructions (Signed)
Please seek medical attention for any high fevers, chest pain, shortness of breath, change in behavior, persistent vomiting, bloody stool or any other new or concerning symptoms.  

## 2022-02-27 NOTE — ED Triage Notes (Signed)
Pt presents via EMS from [facility] with AMS. Pt had a unwitnessed fall yesterday - on Eliquis - unsure if anything was harmed - no complaints at this time. Pt has been altered all day; hx of aphasia.

## 2022-02-27 NOTE — ED Notes (Signed)
Pt back from CT

## 2022-02-27 NOTE — ED Notes (Signed)
Pt to CT

## 2022-02-27 NOTE — ED Notes (Addendum)
[facility] notified of the patients return via EMS to facility.  Brief change prior to departure.

## 2022-02-27 NOTE — ED Notes (Signed)
EDP & family @ the bedside.

## 2023-03-26 ENCOUNTER — Emergency Department: Payer: Medicare Other

## 2023-03-26 ENCOUNTER — Other Ambulatory Visit: Payer: Self-pay

## 2023-03-26 ENCOUNTER — Encounter: Payer: Self-pay | Admitting: Emergency Medicine

## 2023-03-26 ENCOUNTER — Inpatient Hospital Stay
Admission: EM | Admit: 2023-03-26 | Discharge: 2023-03-30 | DRG: 208 | Disposition: E | Payer: Medicare Other | Source: Skilled Nursing Facility | Attending: Internal Medicine | Admitting: Internal Medicine

## 2023-03-26 DIAGNOSIS — Z888 Allergy status to other drugs, medicaments and biological substances status: Secondary | ICD-10-CM

## 2023-03-26 DIAGNOSIS — Z794 Long term (current) use of insulin: Secondary | ICD-10-CM | POA: Diagnosis not present

## 2023-03-26 DIAGNOSIS — F0390 Unspecified dementia without behavioral disturbance: Secondary | ICD-10-CM | POA: Diagnosis present

## 2023-03-26 DIAGNOSIS — I469 Cardiac arrest, cause unspecified: Secondary | ICD-10-CM

## 2023-03-26 DIAGNOSIS — K6389 Other specified diseases of intestine: Secondary | ICD-10-CM | POA: Diagnosis present

## 2023-03-26 DIAGNOSIS — I468 Cardiac arrest due to other underlying condition: Secondary | ICD-10-CM | POA: Diagnosis present

## 2023-03-26 DIAGNOSIS — R579 Shock, unspecified: Secondary | ICD-10-CM | POA: Insufficient documentation

## 2023-03-26 DIAGNOSIS — K559 Vascular disorder of intestine, unspecified: Secondary | ICD-10-CM | POA: Diagnosis present

## 2023-03-26 DIAGNOSIS — E119 Type 2 diabetes mellitus without complications: Secondary | ICD-10-CM

## 2023-03-26 DIAGNOSIS — R578 Other shock: Secondary | ICD-10-CM | POA: Diagnosis present

## 2023-03-26 DIAGNOSIS — E8729 Other acidosis: Secondary | ICD-10-CM | POA: Insufficient documentation

## 2023-03-26 DIAGNOSIS — R809 Proteinuria, unspecified: Secondary | ICD-10-CM | POA: Diagnosis present

## 2023-03-26 DIAGNOSIS — Z87891 Personal history of nicotine dependence: Secondary | ICD-10-CM | POA: Diagnosis not present

## 2023-03-26 DIAGNOSIS — Z85820 Personal history of malignant melanoma of skin: Secondary | ICD-10-CM

## 2023-03-26 DIAGNOSIS — I4510 Unspecified right bundle-branch block: Secondary | ICD-10-CM | POA: Diagnosis present

## 2023-03-26 DIAGNOSIS — G931 Anoxic brain damage, not elsewhere classified: Secondary | ICD-10-CM | POA: Diagnosis present

## 2023-03-26 DIAGNOSIS — R7401 Elevation of levels of liver transaminase levels: Secondary | ICD-10-CM | POA: Diagnosis present

## 2023-03-26 DIAGNOSIS — E1165 Type 2 diabetes mellitus with hyperglycemia: Secondary | ICD-10-CM | POA: Diagnosis present

## 2023-03-26 DIAGNOSIS — Z86718 Personal history of other venous thrombosis and embolism: Secondary | ICD-10-CM | POA: Diagnosis not present

## 2023-03-26 DIAGNOSIS — Z66 Do not resuscitate: Secondary | ICD-10-CM | POA: Diagnosis not present

## 2023-03-26 DIAGNOSIS — J690 Pneumonitis due to inhalation of food and vomit: Secondary | ICD-10-CM | POA: Diagnosis present

## 2023-03-26 DIAGNOSIS — Z9889 Other specified postprocedural states: Secondary | ICD-10-CM

## 2023-03-26 DIAGNOSIS — I69320 Aphasia following cerebral infarction: Secondary | ICD-10-CM

## 2023-03-26 DIAGNOSIS — T17908A Unspecified foreign body in respiratory tract, part unspecified causing other injury, initial encounter: Secondary | ICD-10-CM

## 2023-03-26 DIAGNOSIS — Z79899 Other long term (current) drug therapy: Secondary | ICD-10-CM

## 2023-03-26 DIAGNOSIS — Z1152 Encounter for screening for COVID-19: Secondary | ICD-10-CM

## 2023-03-26 DIAGNOSIS — Z515 Encounter for palliative care: Secondary | ICD-10-CM

## 2023-03-26 DIAGNOSIS — Z7901 Long term (current) use of anticoagulants: Secondary | ICD-10-CM

## 2023-03-26 DIAGNOSIS — I69351 Hemiplegia and hemiparesis following cerebral infarction affecting right dominant side: Secondary | ICD-10-CM

## 2023-03-26 DIAGNOSIS — I10 Essential (primary) hypertension: Secondary | ICD-10-CM | POA: Diagnosis present

## 2023-03-26 DIAGNOSIS — E1151 Type 2 diabetes mellitus with diabetic peripheral angiopathy without gangrene: Secondary | ICD-10-CM | POA: Diagnosis present

## 2023-03-26 DIAGNOSIS — K7200 Acute and subacute hepatic failure without coma: Secondary | ICD-10-CM | POA: Diagnosis present

## 2023-03-26 DIAGNOSIS — E872 Acidosis, unspecified: Secondary | ICD-10-CM | POA: Diagnosis present

## 2023-03-26 DIAGNOSIS — Z885 Allergy status to narcotic agent status: Secondary | ICD-10-CM

## 2023-03-26 DIAGNOSIS — Z7951 Long term (current) use of inhaled steroids: Secondary | ICD-10-CM

## 2023-03-26 DIAGNOSIS — Z90710 Acquired absence of both cervix and uterus: Secondary | ICD-10-CM

## 2023-03-26 DIAGNOSIS — J9601 Acute respiratory failure with hypoxia: Secondary | ICD-10-CM | POA: Diagnosis present

## 2023-03-26 DIAGNOSIS — Z9911 Dependence on respirator [ventilator] status: Secondary | ICD-10-CM

## 2023-03-26 DIAGNOSIS — Z792 Long term (current) use of antibiotics: Secondary | ICD-10-CM

## 2023-03-26 LAB — URINALYSIS, W/ REFLEX TO CULTURE (INFECTION SUSPECTED)
Bilirubin Urine: NEGATIVE
Glucose, UA: NEGATIVE mg/dL
Ketones, ur: NEGATIVE mg/dL
Nitrite: NEGATIVE
Protein, ur: 100 mg/dL — AB
RBC / HPF: >50 RBC/hpf (ref 0–5)
Specific Gravity, Urine: 1.012 (ref 1.005–1.030)
WBC, UA: >50 WBC/hpf (ref 0–5)
pH: 5 (ref 5.0–8.0)

## 2023-03-26 LAB — CBC WITH DIFFERENTIAL/PLATELET
Abs Immature Granulocytes: 0.75 10*3/uL — ABNORMAL HIGH (ref 0.00–0.07)
Basophils Absolute: 0 10*3/uL (ref 0.0–0.1)
Basophils Relative: 0 %
Eosinophils Absolute: 0.1 10*3/uL (ref 0.0–0.5)
Eosinophils Relative: 2 %
HCT: 45.6 % (ref 36.0–46.0)
Hemoglobin: 12.3 g/dL (ref 12.0–15.0)
Immature Granulocytes: 10 %
Lymphocytes Relative: 61 %
Lymphs Abs: 4.5 10*3/uL — ABNORMAL HIGH (ref 0.7–4.0)
MCH: 23.3 pg — ABNORMAL LOW (ref 26.0–34.0)
MCHC: 27 g/dL — ABNORMAL LOW (ref 30.0–36.0)
MCV: 86.5 fL (ref 80.0–100.0)
Monocytes Absolute: 0.2 10*3/uL (ref 0.1–1.0)
Monocytes Relative: 3 %
Neutro Abs: 1.8 10*3/uL (ref 1.7–7.7)
Neutrophils Relative %: 24 %
Platelets: 232 10*3/uL (ref 150–400)
RBC: 5.27 MIL/uL — ABNORMAL HIGH (ref 3.87–5.11)
RDW: 17.3 % — ABNORMAL HIGH (ref 11.5–15.5)
Smear Review: NORMAL
WBC: 7.5 10*3/uL (ref 4.0–10.5)
nRBC: 0.4 % — ABNORMAL HIGH (ref 0.0–0.2)

## 2023-03-26 LAB — COMPREHENSIVE METABOLIC PANEL
ALT: 473 U/L — ABNORMAL HIGH (ref 0–44)
AST: 629 U/L — ABNORMAL HIGH (ref 15–41)
Albumin: 2.7 g/dL — ABNORMAL LOW (ref 3.5–5.0)
Alkaline Phosphatase: 89 U/L (ref 38–126)
Anion gap: 24 — ABNORMAL HIGH (ref 5–15)
BUN: 13 mg/dL (ref 8–23)
CO2: 11 mmol/L — ABNORMAL LOW (ref 22–32)
Calcium: 8.7 mg/dL — ABNORMAL LOW (ref 8.9–10.3)
Chloride: 104 mmol/L (ref 98–111)
Creatinine, Ser: 0.82 mg/dL (ref 0.44–1.00)
GFR, Estimated: >60 mL/min (ref >60)
Glucose, Bld: 334 mg/dL — ABNORMAL HIGH (ref 70–99)
Potassium: 4.8 mmol/L (ref 3.5–5.1)
Sodium: 139 mmol/L (ref 135–145)
Total Bilirubin: 0.6 mg/dL (ref 0.3–1.2)
Total Protein: 6.1 g/dL — ABNORMAL LOW (ref 6.5–8.1)

## 2023-03-26 LAB — MRSA NEXT GEN BY PCR, NASAL: MRSA by PCR Next Gen: NOT DETECTED

## 2023-03-26 LAB — GLUCOSE, CAPILLARY: Glucose-Capillary: 166 mg/dL — ABNORMAL HIGH (ref 70–99)

## 2023-03-26 LAB — BLOOD GAS, ARTERIAL
Acid-base deficit: 20.1 mmol/L — ABNORMAL HIGH (ref 0.0–2.0)
Bicarbonate: 7.9 mmol/L — ABNORMAL LOW (ref 20.0–28.0)
FIO2: 100 %
MECHVT: 500 mL
O2 Saturation: 99.6 %
PEEP: 6 cmH2O
Patient temperature: 37
RATE: 18 resp/min
pCO2 arterial: 25 mmHg — ABNORMAL LOW (ref 32–48)
pH, Arterial: 7.11 — CL (ref 7.35–7.45)
pO2, Arterial: 300 mmHg — ABNORMAL HIGH (ref 83–108)

## 2023-03-26 LAB — MAGNESIUM: Magnesium: 2.3 mg/dL (ref 1.7–2.4)

## 2023-03-26 LAB — LACTIC ACID, PLASMA
Lactic Acid, Venous: >9 mmol/L (ref 0.5–1.9)
Lactic Acid, Venous: >9 mmol/L (ref 0.5–1.9)

## 2023-03-26 LAB — TROPONIN I (HIGH SENSITIVITY): Troponin I (High Sensitivity): 17266 ng/L (ref <18)

## 2023-03-26 LAB — RESP PANEL BY RT-PCR (RSV, FLU A&B, COVID)  RVPGX2
Influenza A by PCR: NEGATIVE
Influenza B by PCR: NEGATIVE
Resp Syncytial Virus by PCR: NEGATIVE
SARS Coronavirus 2 by RT PCR: NEGATIVE

## 2023-03-26 LAB — BETA-HYDROXYBUTYRIC ACID: Beta-Hydroxybutyric Acid: 0.22 mmol/L (ref 0.05–0.27)

## 2023-03-26 LAB — PROTIME-INR
INR: 2.3 — ABNORMAL HIGH (ref 0.8–1.2)
Prothrombin Time: 25.7 seconds — ABNORMAL HIGH (ref 11.4–15.2)

## 2023-03-26 LAB — APTT: aPTT: 64 seconds — ABNORMAL HIGH (ref 24–36)

## 2023-03-26 LAB — PHOSPHORUS: Phosphorus: 9.1 mg/dL — ABNORMAL HIGH (ref 2.5–4.6)

## 2023-03-26 LAB — PROCALCITONIN: Procalcitonin: 0.28 ng/mL

## 2023-03-26 MED ORDER — SODIUM CHLORIDE 0.9 % IV SOLN: INTRAVENOUS | Status: DC

## 2023-03-26 MED ORDER — POLYETHYLENE GLYCOL 3350 17 G PO PACK: 17.0000 g | PACK | Freq: Every day | ORAL | Status: DC | PRN

## 2023-03-26 MED ORDER — GLYCOPYRROLATE 1 MG PO TABS: 1.0000 mg | ORAL_TABLET | ORAL | Status: DC | PRN

## 2023-03-26 MED ORDER — ORAL CARE MOUTH RINSE: 15.0000 mL | OROMUCOSAL | Status: DC

## 2023-03-26 MED ORDER — GLYCOPYRROLATE 0.2 MG/ML IJ SOLN: 0.2000 mg | INTRAMUSCULAR | Status: DC | PRN

## 2023-03-26 MED ORDER — POLYVINYL ALCOHOL 1.4 % OP SOLN: 1.0000 [drp] | Freq: Four times a day (QID) | OPHTHALMIC | Status: DC | PRN

## 2023-03-26 MED ORDER — SODIUM BICARBONATE 8.4 % IV SOLN
100.0000 meq | Freq: Once | INTRAVENOUS | Status: AC
  Administered 2023-03-26: 100 meq via INTRAVENOUS
  Filled 2023-03-26: qty 100

## 2023-03-26 MED ORDER — DOCUSATE SODIUM 50 MG/5ML PO LIQD
100.0000 mg | Freq: Two times a day (BID) | ORAL | Status: DC
  Filled 2023-03-26: qty 10

## 2023-03-26 MED ORDER — FENTANYL CITRATE PF 50 MCG/ML IJ SOSY: 25.0000 ug | PREFILLED_SYRINGE | INTRAMUSCULAR | Status: DC | PRN

## 2023-03-26 MED ORDER — HALOPERIDOL LACTATE 5 MG/ML IJ SOLN: 2.5000 mg | INTRAMUSCULAR | Status: DC | PRN

## 2023-03-26 MED ORDER — ORAL CARE MOUTH RINSE: 15.0000 mL | OROMUCOSAL | Status: DC | PRN

## 2023-03-26 MED ORDER — ENOXAPARIN SODIUM 40 MG/0.4ML IJ SOSY
40.0000 mg | PREFILLED_SYRINGE | INTRAMUSCULAR | Status: DC
  Administered 2023-03-26: 40 mg via SUBCUTANEOUS
  Filled 2023-03-26: qty 0.4

## 2023-03-26 MED ORDER — DOCUSATE SODIUM 100 MG PO CAPS: 100.0000 mg | ORAL_CAPSULE | Freq: Two times a day (BID) | ORAL | Status: DC | PRN

## 2023-03-26 MED ORDER — IOHEXOL 300 MG/ML  SOLN: 100.0000 mL | Freq: Once | INTRAMUSCULAR | Status: DC | PRN

## 2023-03-26 MED ORDER — SODIUM CHLORIDE 0.9 % IV SOLN
3.0000 g | Freq: Four times a day (QID) | INTRAVENOUS | Status: DC
  Administered 2023-03-26: 3 g via INTRAVENOUS
  Filled 2023-03-26 (×2): qty 8

## 2023-03-26 MED ORDER — BUDESONIDE 0.25 MG/2ML IN SUSP: 0.2500 mg | Freq: Two times a day (BID) | RESPIRATORY_TRACT | Status: DC

## 2023-03-26 MED ORDER — ACETAMINOPHEN 325 MG PO TABS: 650.0000 mg | ORAL_TABLET | Freq: Four times a day (QID) | ORAL | Status: DC | PRN

## 2023-03-26 MED ORDER — SODIUM CHLORIDE 0.9 % IV BOLUS
1000.0000 mL | Freq: Once | INTRAVENOUS | Status: AC
  Administered 2023-03-26: 1000 mL via INTRAVENOUS

## 2023-03-26 MED ORDER — NOREPINEPHRINE 4 MG/250ML-% IV SOLN
0.0000 ug/min | INTRAVENOUS | Status: DC
  Administered 2023-03-26: 2 ug/min via INTRAVENOUS
  Filled 2023-03-26: qty 250

## 2023-03-26 MED ORDER — FAMOTIDINE 20 MG PO TABS
20.0000 mg | ORAL_TABLET | Freq: Two times a day (BID) | ORAL | Status: DC
  Administered 2023-03-26: 20 mg
  Filled 2023-03-26: qty 1

## 2023-03-26 MED ORDER — LACTATED RINGERS IV BOLUS
1000.0000 mL | Freq: Once | INTRAVENOUS | Status: AC
  Administered 2023-03-26: 1000 mL via INTRAVENOUS

## 2023-03-26 MED ORDER — IOHEXOL 350 MG/ML SOLN
100.0000 mL | Freq: Once | INTRAVENOUS | Status: AC | PRN
  Administered 2023-03-26: 100 mL via INTRAVENOUS

## 2023-03-26 MED ORDER — POLYETHYLENE GLYCOL 3350 17 G PO PACK
17.0000 g | PACK | Freq: Every day | ORAL | Status: DC
  Filled 2023-03-26: qty 1

## 2023-03-26 MED ORDER — INSULIN ASPART 100 UNIT/ML IJ SOLN
0.0000 [IU] | INTRAMUSCULAR | Status: DC
  Administered 2023-03-26: 2 [IU] via SUBCUTANEOUS
  Filled 2023-03-26: qty 1

## 2023-03-26 MED ORDER — ACETAMINOPHEN 650 MG RE SUPP: 650.0000 mg | Freq: Four times a day (QID) | RECTAL | Status: DC | PRN

## 2023-03-26 MED ORDER — SODIUM CHLORIDE 0.9 % IV SOLN
250.0000 mL | INTRAVENOUS | Status: DC
  Administered 2023-03-26: 250 mL via INTRAVENOUS

## 2023-03-26 MED ORDER — SODIUM CHLORIDE 0.9 % IV BOLUS (SEPSIS)
1000.0000 mL | Freq: Once | INTRAVENOUS | Status: AC
  Administered 2023-03-26: 1000 mL via INTRAVENOUS

## 2023-03-26 MED ORDER — CHLORHEXIDINE GLUCONATE CLOTH 2 % EX PADS
6.0000 | MEDICATED_PAD | Freq: Every day | CUTANEOUS | Status: DC
  Administered 2023-03-26: 6 via TOPICAL

## 2023-03-26 MED ORDER — PIPERACILLIN-TAZOBACTAM 3.375 G IVPB 30 MIN
3.3750 g | Freq: Once | INTRAVENOUS | Status: AC
  Administered 2023-03-26: 3.375 g via INTRAVENOUS
  Filled 2023-03-26: qty 50

## 2023-03-26 NOTE — IPAL (Signed)
  Interdisciplinary Goals of Care Family Meeting   Date carried out: March 28, 2023  Location of the meeting: Bedside  Member's involved: Nurse Practitioner and Family Member or next of kin  Durable Power of Attorney or acting medical decision maker: Son, Emily Lozano & Sister, Emily Lozano  Discussion: We discussed goals of care for Emily Lozano. The patient's blood pressure is now refractory to maximum vasopressor support and her son has been able to confer with other family members about the patient's grave prognosis. They would like to transition to comfort measures only.  Code status:   Code Status: DNR   Disposition: In-patient comfort care  Time spent for the meeting: 15 minutes    Cecelia Byars Rust-Chester, NP  03-28-23, 11:23 PM  Cheryll Cockayne Rust-Chester, AGACNP-BC Acute Care Nurse Practitioner [facility]   [phone] / [phone] Please see Amion for details.

## 2023-03-26 NOTE — ED Provider Notes (Signed)
[facility] Provider Note    Event Date/Time   First MD Initiated Contact with Patient 03/21/2023 1927     (approximate)   History   Chief Complaint: Cardiac Arrest   HPI  Emily Lozano is a 72 y.o. female with a history of hypertension diabetes dementia prior stroke who was brought to the ED after cardiac arrest with ROSC.  Patient was at her long-term care facility, found unresponsive by staff who started CPR.  EMS arrived, found the patient in asystole.  Placed i-gel advanced airway and continued ACLS.  After 2 rounds of epi and ACLS, patient had ROSC.  Patient did not receive any sedatives or paralytics by EMS.     Physical Exam   Triage Vital Signs: ED Triage Vitals  Enc Vitals Group     BP 03/02/2023 1924 115/63     Pulse Rate 03/21/2023 1924 73     Resp --      Temp --      Temp src --      SpO2 03/07/2023 1924 97 %     Weight 03/06/2023 1927 124 lb 1.9 oz (56.3 kg)     Height --      Head Circumference --      Peak Flow --      Pain Score --      Pain Loc --      Pain Edu? --      Excl. in GC? --     Most recent vital signs: Vitals:   03/17/2023 2250 03/15/2023 2255  BP: (!) 68/44 (!) 80/49  Pulse: 81 83  Resp: 16 16  Temp: (!) 91.6 F (33.1 C) (!) 91.6 F (33.1 C)  SpO2: 97% 97%    General: GCS 3. CV:  Good peripheral perfusion.  Regular rate and rhythm Resp:  Normal effort.  I-gel in place, good breath sounds, symmetric with bagging. Abd:  Soft, no distention.  No ecchymosis. Other:  Pupils fixed and dilated bilaterally.  Dry oral mucosa, emesis in the oropharynx.   ED Results / Procedures / Treatments   Labs (all labs ordered are listed, but only abnormal results are displayed) Labs Reviewed  LACTIC ACID, PLASMA - Abnormal; Notable for the following components:      Result Value   Lactic Acid, Venous >9.0 (*)    All other components within normal limits  LACTIC ACID, PLASMA - Abnormal; Notable for the following components:    Lactic Acid, Venous >9.0 (*)    All other components within normal limits  COMPREHENSIVE METABOLIC PANEL - Abnormal; Notable for the following components:   CO2 11 (*)    Glucose, Bld 334 (*)    Calcium 8.7 (*)    Total Protein 6.1 (*)    Albumin 2.7 (*)    AST 629 (*)    ALT 473 (*)    Anion gap 24 (*)    All other components within normal limits  CBC WITH DIFFERENTIAL/PLATELET - Abnormal; Notable for the following components:   RBC 5.27 (*)    MCH 23.3 (*)    MCHC 27.0 (*)    RDW 17.3 (*)    nRBC 0.4 (*)    Lymphs Abs 4.5 (*)    Abs Immature Granulocytes 0.75 (*)    All other components within normal limits  PROTIME-INR - Abnormal; Notable for the following components:   Prothrombin Time 25.7 (*)    INR 2.3 (*)    All other components  within normal limits  APTT - Abnormal; Notable for the following components:   aPTT 64 (*)    All other components within normal limits  URINALYSIS, W/ REFLEX TO CULTURE (INFECTION SUSPECTED) - Abnormal; Notable for the following components:   Color, Urine AMBER (*)    APPearance TURBID (*)    Hgb urine dipstick LARGE (*)    Protein, ur 100 (*)    Leukocytes,Ua MODERATE (*)    Bacteria, UA MANY (*)    All other components within normal limits  BLOOD GAS, ARTERIAL - Abnormal; Notable for the following components:   pH, Arterial 7.11 (*)    pCO2 arterial 25 (*)    pO2, Arterial 300 (*)    Bicarbonate 7.9 (*)    Acid-base deficit 20.1 (*)    All other components within normal limits  PHOSPHORUS - Abnormal; Notable for the following components:   Phosphorus 9.1 (*)    All other components within normal limits  GLUCOSE, CAPILLARY - Abnormal; Notable for the following components:   Glucose-Capillary 166 (*)    All other components within normal limits  TROPONIN I (HIGH SENSITIVITY) - Abnormal; Notable for the following components:   Troponin I (High Sensitivity) 17,266 (*)    All other components within normal limits  RESP PANEL BY  RT-PCR (RSV, FLU A&B, COVID)  RVPGX2  MRSA NEXT GEN BY PCR, NASAL  CULTURE, BLOOD (ROUTINE X 2)  CULTURE, BLOOD (ROUTINE X 2)  RESPIRATORY PANEL BY PCR  MAGNESIUM  PROCALCITONIN  BETA-HYDROXYBUTYRIC ACID  HEMOGLOBIN A1C     EKG Interpreted by me Sinus rhythm rate of 69, left axis, right bundle branch block.  No acute ischemic changes.   RADIOLOGY Chest x-ray interpreted by me, appears unremarkable, endotracheal tube in good position.  Radiology report reviewed KUB interpreted by me, no signs of GI perforation.  Orogastric tube in good position.  Radiology report reviewed   PROCEDURES:  .Critical Care  Performed by: Sharman Cheek, MD Authorized by: Sharman Cheek, MD   Critical care provider statement:    Critical care time (minutes):  35   Critical care time was exclusive of:  Separately billable procedures and treating other patients   Critical care was necessary to treat or prevent imminent or life-threatening deterioration of the following conditions:  Respiratory failure and CNS failure or compromise   Critical care was time spent personally by me on the following activities:  Development of treatment plan with patient or surrogate, discussions with consultants, evaluation of patient's response to treatment, examination of patient, obtaining history from patient or surrogate, ordering and performing treatments and interventions, ordering and review of laboratory studies, ordering and review of radiographic studies, pulse oximetry, re-evaluation of patient's condition, review of old charts and ventilator management   Care discussed with: admitting provider   Comments:        Procedure Name: Intubation Date/Time: 04-08-2023 7:30 PM  Performed by: Sharman Cheek, MDPre-anesthesia Checklist: Patient identified, Emergency Drugs available, Suction available and Patient being monitored Preoxygenation: Pre-oxygenation with 100% oxygen Laryngoscope Size: Glidescope and  3 Grade View: Grade I Tube size: 7.5 mm Number of attempts: 1 Airway Equipment and Method: Video-laryngoscopy Placement Confirmation: ETT inserted through vocal cords under direct vision, CO2 detector and Breath sounds checked- equal and bilateral Secured at: 24 cm Tube secured with: ETT holder Dental Injury: Teeth and Oropharynx as per pre-operative assessment        MEDICATIONS ORDERED IN ED: Medications  polyethylene glycol (MIRALAX / GLYCOLAX) packet 17  g (has no administration in time range)  0.9 %  sodium chloride infusion ( Intravenous Infusion Verify 03/06/2023 2257)  polyethylene glycol (MIRALAX / GLYCOLAX) packet 17 g (17 g Per Tube Not Given 03/20/2023 2125)  0.9 %  sodium chloride infusion (has no administration in time range)  acetaminophen (TYLENOL) tablet 650 mg (has no administration in time range)    Or  acetaminophen (TYLENOL) suppository 650 mg (has no administration in time range)  glycopyrrolate (ROBINUL) tablet 1 mg (has no administration in time range)    Or  glycopyrrolate (ROBINUL) injection 0.2 mg (has no administration in time range)    Or  glycopyrrolate (ROBINUL) injection 0.2 mg (has no administration in time range)  polyvinyl alcohol (LIQUIFILM TEARS) 1.4 % ophthalmic solution 1 drop (has no administration in time range)  fentaNYL (SUBLIMAZE) injection 25-100 mcg (has no administration in time range)  haloperidol lactate (HALDOL) injection 2.5-5 mg (has no administration in time range)  sodium chloride 0.9 % bolus 1,000 mL (0 mLs Intravenous Stopped 03/14/2023 2037)  piperacillin-tazobactam (ZOSYN) IVPB 3.375 g (0 g Intravenous Stopped 03/21/2023 2037)  sodium chloride 0.9 % bolus 1,000 mL (0 mLs Intravenous Stopped 03/29/2023 2037)  iohexol (OMNIPAQUE) 350 MG/ML injection 100 mL (100 mLs Intravenous Contrast Given 03/16/2023 2046)  sodium bicarbonate injection 100 mEq (100 mEq Intravenous Given 03/07/2023 2133)  lactated ringers bolus 1,000 mL (0 mLs Intravenous  Stopped 03/03/2023 2242)     IMPRESSION / MDM / ASSESSMENT AND PLAN / ED COURSE  I reviewed the triage vital signs and the nursing notes.  DDx: Aspiration with respiratory arrest, NSTEMI, electrolyte abnormality, anemia, bowel obstruction or volvulus, CVA, dehydration, UTI, sepsis  Patient's presentation is most consistent with acute presentation with potential threat to life or bodily function.  Patient presents in sinus rhythm, comatose with Igel airway after out-of-hospital cardiac arrest.  EMS report copious emesis on scene and she has emesis in her airway, suspect aspiration.  Airway was suctioned with laryngoscopy, endotracheally intubated.  GCS remains 3.  Will check labs, chest x-ray, start Zosyn, admit to ICU.  ----------------------------------------- 7:57 PM on 03/17/2023 ----------------------------------------- KUB shows large amount of air in the stomach and bowel, will obtain CT.   Clinical Course as of 04/12/23 0002  Thu Mar 26, 2023  2032 Case discussed with ICU team for admission.  Family updated [PS]    Clinical Course User Index [PS] Sharman Cheek, MD     FINAL CLINICAL IMPRESSION(S) / ED DIAGNOSES   Final diagnoses:  Cardiac arrest [facility])  Aspiration into airway, initial encounter     Rx / DC Orders   ED Discharge Orders     None        Note:  This document was prepared using Dragon voice recognition software and may include unintentional dictation errors.   Sharman Cheek, MD 2023/04/12 Marlyne Beards

## 2023-03-26 NOTE — Progress Notes (Signed)
Patient extubated at this time, per NP order and family wishes for comfort care.

## 2023-03-26 NOTE — ED Triage Notes (Signed)
Pt to ED via EMS from [facility] c/o cardiac arrest.  Pt found by staff unresponsive in bed around 1810 and CPR started by staff.  EMS resumed CPR, pt had total 30 min CPR.  EMS gave 2 rounds of epi,\ d/t pt rhythm asystole.  First epi at 1846, second epi at 1853 and regained pulses at 1900.  Pt at facility long term for previous stroke.  EMS vitals 136/74 BP, 100 HR, 229 CBG.  Airway in place on arrival.

## 2023-03-26 NOTE — ED Notes (Addendum)
Pt transported to CT ?

## 2023-03-26 NOTE — IPAL (Signed)
  Interdisciplinary Goals of Care Family Meeting   Date carried out: 03/13/2023  Location of the meeting: Conference room  Member's involved: Nurse Practitioner and Family Member or next of kin  Durable Power of Attorney or acting medical decision maker: son, Apolinar Junes & sister, Pattie    Discussion: We discussed goals of care for [employer] who arrived after a prolonged cardiac arrest. Imaging has now been completed and the results reveal multiple devastating findings. We discussed that her CT head is showing global anoxic injury and her CT abdomen/pelvis revealed bowel ischemia & pneumatosis as well as an aspiration pneumonia. The patient is in acute liver failure, with severe lactic acidosis & an anion gap metabolic acidosis. Clinically she is unresponsive without reflexes on no sedatives/opiates. She is not a candidate for TTM or surgery. We discussed her overall grave prognosis and high risk for cardiac arrest. Family consented to DNR and would like time to discuss potentially transitioning to comfort measures only  Code status:   Code Status: DNR   Disposition: Continue current acute care  Time spent for the meeting: 30 minutes    Cecelia Byars Rust-Chester, NP  03/24/2023, 10:21 PM  Cheryll Cockayne Rust-Chester, AGACNP-BC Acute Care Nurse Practitioner [facility]   [phone] / [phone] Please see Amion for details.

## 2023-03-26 NOTE — H&P (Signed)
NAME:  Emily Lozano, MRN:  [id number], DOB:  11/29/1950, LOS: 0 ADMISSION DATE:  03/13/2023, CONSULTATION DATE:  03/03/2023 REFERRING MD:  Dr. Scotty Court, CHIEF COMPLAINT:  Cardiac arrest   History of Present Illness:  72 yo F presenting to [facility] ED via EMS from [facility] for evaluation after a cardiac arrest.  History provided by family bedside and chart review as patient is intubated and unresponsive post cardiac arrest. Patient was in her normal state of health without any recent complaints except for refusing her dinner on 03/10/2023.  She was later found unresponsive and pulseless by staff with vomit surrounding her and CPR was initiated.  It is unknown how long she was unresponsive prior to staff finding her.  EMS arrived and reported the initial rhythm is asystole.  They placed an Igel advanced airway and continued ACLS.  Downtime was reported as 20+ minutes prior to ROSC.  The patient did not receive any sedatives or paralytics from EMS. Per family patient has been able to take all medication as prescribed including her Eliquis for a previous DVT.  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  She has a remote smoking history and is not on any oxygen chronically.  As far as her family know the patient had no other recent complaints.  ED course: Upon arrival patient was unresponsive with a RASS of -5.  Igel airway was exchanged for an endotracheal tube without the use of sedatives or paralytics for induction.  Lab work significant for severe lactic acidosis without leukocytosis, shock liver, AGMA & hyperglycemia.  X-rays showed central vascular congestion as well as a significant amount of air and stool in the colon.  Follow-up CT imaging revealed global anoxic injury and bowel ischemia with pneumatosis as well as suspected aspiration pneumonia. Medications given: 2 L NS bolus, IV contrast, Zosyn Initial Vitals: 96.6, 22, 104/57 and  100% on 100% FiO2 Significant labs: (Labs/ Imaging personally reviewed) I, Cheryll Cockayne Rust-Chester, AGACNP-BC, personally viewed and interpreted this ECG. EKG Interpretation: Date: 03/05/2023, EKG Time: 19:20, Rate: 69, Rhythm: NSR, QRS Axis:  LAD Intervals: RBBB, ST/T Wave abnormalities: none, Narrative Interpretation: NSR with new bundle branch block Chemistry: Na+: 139, K+: 4.8, BUN/Cr.: 13/0.82, Serum CO2/ AG: 11/24, AST/ALT: 629/473 Hematology: WBC: 7.5, Hgb: 12.3,  Troponin: Pending, BNP: Pending, Lactic/ PCT: >9.0/pending,  COVID-19 & Influenza A/B: Negative UA: + Leuks + pyuria > 50 WBCs, + proteinuria (negative for ketones)  ABG: 7.11/25/300/7.9 CXR 03/23/2023: Central pulmonary vascular congestion KUB 03/14/2023: Air and stool visualized in the colon CT head wo contrast 03/11/2023: Evidence of diffuse cerebrocerebellar edema with sulcal effacement and partially effaced Sylvian fissures. 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 No focal cortical based infarct or hemorrhage is seen. Chronic changes. Fluid in the left maxillary sinus which could be due to recent intubation or sinusitis. CT angio PE 03/23/2023: No pulmonary embolism. Multifocal consolidation within the dependent lungs, most severe within the superior segment of the right lower lobe and posterior basal left lower lobe which may relate to acute infection or aspiration. Moderate multi-vessel coronary artery calcification. Acute fractures of the right 4, 6 and left 2-6 ribs anteriorly. No pneumothorax. CT abdomen/pelvis w contrast 03/01/2023: Regional bowel ischemia involving the cecum and ascending colon with associated pneumatosis and mesenteric venous gas. No free intraperitoneal gas or fluid. Diffuse hyperemia and fluid distension of the small bowel, a finding that can be seen in systemic hypotension and developing  adynamic ileus. Peripheral vascular disease with near occlusion of the superior mesenteric artery origin. 9 mm  enhancing nodule within the right adrenal gland, indeterminate though statistically likely represents a small adrenal adenoma in a patient without a history of malignancy. If indicated, this could be reassessed in 1 year with adrenal mass protocol CT or MRI examination for confirmation.  PCCM consulted for admission due to out of hospital cardiac arrest with acute hypoxic respiratory failure requiring mechanical ventilatory support.  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  Interim History / Subjective:  Patient unresponsive with a RASS of -5 having not received any sedatives or paralytics since event.  Vital signs stable at this time. Family updated all questions and concerns answered at this time.  Objective   Blood pressure 96/62, pulse 66, temperature (!) 94.3 F (34.6 C), resp. rate (!) 21, height 5\' 5"  (1.651 m), weight 56.3 kg, SpO2 100 %.    Vent Mode: AC FiO2 (%):  [100 %] 100 % Set Rate:  [18 bmp] 18 bmp Vt Set:  [500 mL] 500 mL PEEP:  [6 cmH20] 6 cmH20   Intake/Output Summary (Last 24 hours) at 03/16/2023 2108 Last data filed at 03/28/2023 2058 Gross per 24 hour  Intake --  Output 20 ml  Net -20 ml   Filed Weights   03/08/2023 1927  Weight: 56.3 kg    Examination: General: Adult female, critically ill, lying in bed intubated & sedated requiring mechanical ventilation, NAD HEENT: MM pink/moist, anicteric, atraumatic, neck supple Neuro: RASS -5 unable to follow commands, +4, non reactive/no cough/gag/corneal reflex CV: s1s2 RRR, NSR with bundle branch block on monitor, no  r/m/g Pulm: Regular, non labored on PRVC 50% and PEEP of 5, breath sounds coarse/diminished-BUL & diminished-BLL GI: soft, rounded, hypoactive BS GU: foley in place with clear yellow urine Skin: no rashes/lesions noted Extremities: warm/dry, pulses + 2 R/P, no edema noted  Resolved Hospital Problem list     Assessment & Plan:  Cardiac arrest: initial rhythm asystole Circulatory shock 20+ minutes downtime, unknown amount of time before CPR initiated, suspected anoxic injury - not a candidate for Normothermia Protocol due to prolonged downtime and overall poor prognosis - Consider vasopressors: levophed/vasopressin PRN, to maintain MAP > 65 - Echocardiogram ordered - Trend troponin, lactic -Consider cardiology consult as needed -Outpatient metoprolol on hold, consider restarting as patient stabilizes  Acute hypoxic respiratory failure  Suspected aspiration pneumonia - Ventilator settings: PRVC 8 mL/kg, 50% FiO2, 5 PEEP - Wean PEEP and FiO2 for sats greater than 90% - Plateau pressures less than 30 cm H20 - VAP bundle in place - Intermittent chest x-ray & ABG - Daily WUA/ SBT as tolerated - Ensure adequate pulmonary hygiene  - F/u cultures, trend PCT - Aspiration Pna coverage: Unasyn  Lactic acidosis without leukocytosis Suspected aspiration pneumonia - f/u cultures, trend lactic/ PCT - Daily CBC, monitor WBC/ fever curve - IV antibiotics: unasyn  - Consider vasopressors to maintain MAP< 65: norepinephrine/vasopressin - Persistent hypotension consider stress dose steroids   Anion gap metabolic acidosis - Strict I/O's: alert provider if UOP < 0.5 mL/kg/hr - IVF hydration  -Sodium bicarbonate supplementation - Daily  BMP, replace electrolytes PRN - Avoid nephrotoxic agents as able, ensure adequate renal perfusion  Suspected anoxic injury PMHx: CVA 20+ minutes downtime, unknown amount of time before CPR initiated,  - PAD protocol in place:  fentanyl IV as needed - RASS  goal: -1, 0 - Assess EEG  - repeat CT head - Neuro consult if needed   History of DVT on chronic anticoagulation -Hold Eliquis due to unstable state, start heparin drip once CT head read officially  Type 2 Diabetes Mellitus Hemoglobin A1C: Pending - Monitor CBG Q 4 hours - SSI moderate dosing - target range while in ICU: 140-180 - follow ICU hyper/hypo-glycemia protocol  Transaminitis  - Trend hepatic function - avoid hepatotoxic agents, outpatient rosuvastatin on hold  Best Practice (right click and "Reselect all SmartList Selections" daily)  Diet/type: NPO w/ meds via tube DVT prophylaxis: systemic heparin GI prophylaxis: H2B Lines: N/A Foley:  Yes, and it is still needed Code Status:  full code Last date of multidisciplinary goals of care discussion [initial discussion with family son confirmed full code 03/10/2023 will readdress with full CT imaging results]  Labs   CBC: Recent Labs  Lab 03/02/2023 1927  WBC 7.5  NEUTROABS 1.8  HGB 12.3  HCT 45.6  MCV 86.5  PLT 232    Basic Metabolic Panel: Recent Labs  Lab 03/01/2023 1927  NA 139  K 4.8  CL 104  CO2 11*  GLUCOSE 334*  BUN 13  CREATININE 0.82  CALCIUM 8.7*   GFR: Estimated Creatinine Clearance: 55.1 mL/min (by C-G formula based on SCr of 0.82 mg/dL). Recent Labs  Lab 03/23/2023 1926 03/25/2023 1927  WBC  --  7.5  LATICACIDVEN >9.0*  --     Liver Function Tests: Recent Labs  Lab 03/05/2023 1927  AST 629*  ALT 473*  ALKPHOS 89  BILITOT 0.6  PROT 6.1*  ALBUMIN 2.7*   No results for input(s): "LIPASE", "AMYLASE" in the last 168 hours. No results for input(s): "AMMONIA" in the last 168 hours.  ABG    Component Value Date/Time   PHART 7.11 (LL) 02/28/2023 2043   PCO2ART 25 (L) 03/24/2023 2043   PO2ART 300 (H) 03/17/2023 2043   HCO3 7.9 (L) 03/18/2023 2043   TCO2 25 11/24/2020 2036   ACIDBASEDEF 20.1 (H) 03/01/2023 2043   O2SAT 99.6 03/20/2023 2043     Coagulation Profile: Recent Labs  Lab  03/10/2023 1927  INR 2.3*    Cardiac Enzymes: No results for input(s): "CKTOTAL", "CKMB", "CKMBINDEX", "TROPONINI" in the last 168 hours.  HbA1C: Hgb A1c MFr Bld  Date/Time Value Ref Range Status  11/25/2020 06:08 AM 10.6 (H) 4.8 - 5.6 % Final    Comment:    (NOTE) Pre diabetes:          5.7%-6.4%  Diabetes:              >6.4%  Glycemic control for   <7.0% adults with diabetes   11/24/2020 08:58 PM 10.4 (H) 4.8 - 5.6 % Final    Comment:    (NOTE) Pre diabetes:          5.7%-6.4%  Diabetes:              >6.4%  Glycemic control for   <7.0% adults with diabetes     CBG: No results for input(s): "GLUCAP" in the last 168 hours.  Review of Systems:   UTA- patient intubated and unresponsive, unable to participate in interview at this time.  Past Medical History:  She,  has a past medical history of Anemia, Arthritis, Collagen vascular disease (HCC), CVA (cerebral vascular accident) (HCC), Dementia (HCC), Depression, Diabetes mellitus without complication (HCC), Hypertension, and Melanoma (HCC) (2014).   Surgical History:   Past Surgical History:  Procedure Laterality Date   ABDOMINAL HYSTERECTOMY     BREAST LUMPECTOMY     CARPAL TUNNEL RELEASE     ESOPHAGEAL MANOMETRY N/A 12/05/2015   Procedure: ESOPHAGEAL MANOMETRY (EM);  Surgeon: Elnita Maxwell, MD;  Location: [facility] ENDOSCOPY;  Service: Endoscopy;  Laterality: N/A;   ESOPHAGOGASTRODUODENOSCOPY (EGD) WITH PROPOFOL N/A 11/26/2015   Procedure: ESOPHAGOGASTRODUODENOSCOPY (EGD) WITH PROPOFOL;  Surgeon: Christena Deem, MD;  Location: [facility] ENDOSCOPY;  Service: Endoscopy;  Laterality: N/A;   ESOPHAGOGASTRODUODENOSCOPY (EGD) WITH PROPOFOL N/A 04/30/2017   Procedure: ESOPHAGOGASTRODUODENOSCOPY (EGD) WITH PROPOFOL;  Surgeon: Christena Deem, MD;  Location: [facility] ENDOSCOPY;  Service: Endoscopy;  Laterality: N/A;   EXCISION MELANOMA WITH SENTINEL LYMPH NODE BIOPSY     IR CT HEAD LTD  11/24/2020   IR CT HEAD LTD  11/24/2020    IR PERCUTANEOUS ART THROMBECTOMY/INFUSION INTRACRANIAL INC DIAG ANGIO  11/24/2020   IR US GUIDE VASC ACCESS RIGHT  11/24/2020   RADIOLOGY WITH ANESTHESIA N/A 11/24/2020   Procedure: IR WITH ANESTHESIA;  Surgeon: Radiologist, Medication, MD;  Location: [facility] OR;  Service: Radiology;  Laterality: N/A;     Social History:   reports that she has never smoked. She has never used smokeless tobacco. She reports that she does not currently use alcohol. She reports that she does not use drugs.   Family History:  Her family history is not on file.   Allergies Allergies  Allergen Reactions   Ativan [Lorazepam]    Morphine And Codeine Nausea And Vomiting     Home Medications  Prior to Admission medications   Medication Sig Start Date End Date Taking? Authorizing Provider  acetaminophen (TYLENOL) 500 MG tablet Take 500 mg by mouth every 6 (six) hours as needed.    [provider]  amoxicillin (AMOXIL) 875 MG tablet Take 875 mg by mouth 2 (two) times daily.    [provider]  apixaban (ELIQUIS) 5 MG TABS tablet Take 1 tablet (5 mg total) by mouth 2 (two) times daily. 12/10/20   Elgergawy, Leana Roe, MD  buPROPion (WELLBUTRIN SR) 150 MG 12 hr tablet Take 150 mg by mouth 2 (two) times daily.    [provider]  divalproex (DEPAKOTE SPRINKLE) 125 MG capsule Take 125 mg by mouth 2 (two) times daily. 01/08/21   [provider]  escitalopram (LEXAPRO) 10 MG tablet Take 1 tablet by mouth daily. 01/07/21   [provider]  feeding supplement (ENSURE ENLIVE / ENSURE PLUS) LIQD Take 237 mLs by mouth 3 (three) times daily between meals. 12/10/20   Elgergawy, Leana Roe, MD  gabapentin (NEURONTIN) 100 MG capsule Take by mouth. 01/14/21   [provider]  insulin aspart (NOVOLOG) 100 UNIT/ML injection Inject 0-15 Units into the skin 3 (three) times daily with meals. 12/10/20   Elgergawy, Leana Roe, MD  insulin detemir (LEVEMIR) 100 UNIT/ML injection Inject 0.14 mLs (14  Units total) into the skin daily. 12/11/20   Elgergawy, Leana Roe, MD  loratadine (CLARITIN) 10 MG tablet Take 10 mg by mouth daily.    [provider]  LORazepam (ATIVAN) 1 MG tablet Take 1 mg by mouth every 8 (eight) hours.    [provider]  Maltodextrin-Xanthan Gum (RESOURCE THICKENUP CLEAR) POWD nectar thick 12/10/20  Elgergawy, Leana Roe, MD  melatonin 5 MG TABS Take 5 mg by mouth.    [provider]  metoprolol tartrate (LOPRESSOR) 50 MG tablet Take 1 tablet (50 mg total) by mouth 2 (two) times daily. 12/10/20   Elgergawy, Leana Roe, MD  mometasone-formoterol (DULERA) 100-5 MCG/ACT AERO Inhale 2 puffs into the lungs 2 (two) times daily. 12/10/20   Elgergawy, Leana Roe, MD  [facility] powder Apply topically. 01/16/21   [provider]  pantoprazole (PROTONIX) 40 MG tablet Take 40 mg by mouth 2 (two) times daily.    [provider]  rosuvastatin (CRESTOR) 10 MG tablet Take 10 mg by mouth daily.    [provider]  tuberculin (TUBERSOL) 5 UNIT/0.1ML injection Inject 0.1 mLs into the skin once.    [provider]     Critical care time: 70 minutes       Betsey Holiday, AGACNP-BC Acute Care Nurse Practitioner [facility]   [phone] / [phone] Please see Amion for details.

## 2023-03-26 NOTE — Progress Notes (Signed)
RT transported pt via vent from ED 5 to CT scan and to ICU 18 without incident.

## 2023-03-27 DIAGNOSIS — J9601 Acute respiratory failure with hypoxia: Secondary | ICD-10-CM | POA: Insufficient documentation

## 2023-03-27 DIAGNOSIS — K7200 Acute and subacute hepatic failure without coma: Secondary | ICD-10-CM | POA: Insufficient documentation

## 2023-03-27 DIAGNOSIS — E119 Type 2 diabetes mellitus without complications: Secondary | ICD-10-CM

## 2023-03-27 DIAGNOSIS — J690 Pneumonitis due to inhalation of food and vomit: Secondary | ICD-10-CM | POA: Insufficient documentation

## 2023-03-27 DIAGNOSIS — E872 Acidosis, unspecified: Secondary | ICD-10-CM | POA: Insufficient documentation

## 2023-03-27 DIAGNOSIS — E8729 Other acidosis: Secondary | ICD-10-CM | POA: Insufficient documentation

## 2023-03-27 DIAGNOSIS — G931 Anoxic brain damage, not elsewhere classified: Secondary | ICD-10-CM | POA: Insufficient documentation

## 2023-03-27 DIAGNOSIS — Z9911 Dependence on respirator [ventilator] status: Secondary | ICD-10-CM

## 2023-03-27 DIAGNOSIS — R579 Shock, unspecified: Secondary | ICD-10-CM | POA: Insufficient documentation

## 2023-03-27 LAB — RESPIRATORY PANEL BY PCR

## 2023-03-27 LAB — CULTURE, BLOOD (ROUTINE X 2): Culture: NO GROWTH

## 2023-03-28 LAB — CULTURE, BLOOD (ROUTINE X 2)

## 2023-03-28 LAB — HEMOGLOBIN A1C
Hgb A1c MFr Bld: 5.9 % — ABNORMAL HIGH (ref 4.8–5.6)
Mean Plasma Glucose: 123 mg/dL

## 2023-03-29 LAB — CULTURE, BLOOD (ROUTINE X 2)

## 2023-03-30 NOTE — Progress Notes (Signed)
   03/08/2023 2330  Spiritual Encounters  Type of Visit Initial  Care provided to: Family  Referral source Nurse (RN/NT/LPN)  Reason for visit End-of-life  OnCall Visit Yes  Spiritual Framework  Family Stress Factors Major life changes  Interventions  Spiritual Care Interventions Made Bereavement/grief support   Chaplain responded to provide end of life support to family.

## 2023-03-30 NOTE — Death Summary Note (Signed)
DEATH SUMMARY   Patient Details  Name: Emily Lozano MRN: [id number] DOB: 04-14-1951  Admission/Discharge Information   Admit Date:  April 05, 2023  Date of Death: Date of Death: 04-06-2023  Time of Death: Time of Death: 2023/04/23  Length of Stay: 1  Referring Physician: Housecalls, Doctors Making   Reason(s) for Hospitalization  Out of hospital cardiac arrest  Diagnoses  Preliminary cause of death: Aspiration Secondary Diagnoses (including complications and co-morbidities):  Principal Problem:   Cardiac arrest (HCC) Active Problems:   Increased anion gap metabolic acidosis   Shock liver   Aspiration pneumonia (HCC)   Anoxic brain injury (HCC)   Type 2 diabetes mellitus (HCC)   Acute hypoxic respiratory failure (HCC)   On mechanically assisted ventilation (HCC)   Lactic acidosis   Shock circulatory [facility])   Brief Hospital Course (including significant findings, care, treatment, and services provided and events leading to death)  Emily Lozano is a 72 y.o. year old female who presenting to [facility] ED via EMS from [facility] for evaluation after a cardiac arrest.  Patient had a prolonged downtime as no one is sure how long she was unresponsive prior to being found and CPR initiated by SNF staff.  Once CPR was initiated downtime is estimated at 20 minutes.  EMS reported initial rhythm was asystole. ED course: Upon arrival patient was unresponsive with a RASS of -5.  Igel airway was exchanged for an endotracheal tube without the use of sedatives or paralytics for induction.  Lab work significant for severe lactic acidosis without leukocytosis, shock liver, AGMA & hyperglycemia.  X-rays showed central vascular congestion as well as a significant amount of air and stool in the colon.  Follow-up CT imaging revealed global anoxic injury and bowel ischemia with pneumatosis as well as suspected aspiration pneumonia. Patient was admitted to ICU for management and monitoring however once CT  imaging results are verified goals of care discussion was had with family regarding critical results.  After arrival to ICU patient became significantly hypotensive with refractory shock requiring maximum vasopressor support.  Due to catastrophic CT results and family's understanding of the patient's wishes, decision was made to transition to comfort measures. Patient passed away with family bedside.   Pertinent Labs and Studies  Significant Diagnostic Studies CT Angio Chest PE W and/or Wo Contrast  Addendum Date: 04-05-2023   ADDENDUM REPORT: 04-05-2023 22:07 ADDENDUM: These results were called by telephone at the time of interpretation on April 05, 2023 at 10:06 pm to provider Cheryll Cockayne, MD, who verbally acknowledged these results. Electronically Signed   By: Helyn Numbers M.D.   On: April 05, 2023 22:07   Result Date: 04/05/23 CLINICAL DATA:  Cardiopulmonary arrest, pulmonary embolism, sepsis EXAM: CT ANGIOGRAPHY CHEST CT ABDOMEN AND PELVIS WITH CONTRAST TECHNIQUE: Multidetector CT imaging of the chest was performed using the standard protocol during bolus administration of intravenous contrast. Multiplanar CT image reconstructions and MIPs were obtained to evaluate the vascular anatomy. Multidetector CT imaging of the abdomen and pelvis was performed using the standard protocol during bolus administration of intravenous contrast. RADIATION DOSE REDUCTION: This exam was performed according to the departmental dose-optimization program which includes automated exposure control, adjustment of the mA and/or kV according to patient size and/or use of iterative reconstruction technique. CONTRAST:  OMNIPAQUE IOHEXOL 350 MG/ML SOLN COMPARISON:  CT chest 05/18/2018 FINDINGS: CTA CHEST FINDINGS Cardiovascular: There is adequate opacification of the pulmonary arterial tree. No intraluminal filling defect identified to suggest acute pulmonary embolism. Central pulmonary arteries are  of normal caliber. Moderate  multi-vessel coronary artery calcification. Global cardiac size within normal limits. No pericardial effusion. Moderate atherosclerotic calcification within the thoracic aorta. No aortic aneurysm. Mediastinum/Nodes: Endotracheal tube seen 2.8 cm above the carina. Nasogastric tube extends into the proximal body of the stomach. There is extensive debris within the esophagus. Visualized thyroid is unremarkable. No pathologic thoracic adenopathy. Lungs/Pleura: There is multifocal consolidation within the dependent lungs, most severe within the superior segment of the right lower lobe and posterior basal left lower lobe which may relate to acute infection or aspiration. Superimposed volume loss within the left lower lobe. No pneumothorax or pleural effusion. Musculoskeletal: There are acute fractures of the right 4, 6 and left 2-6 ribs anteriorly. Review of the MIP images confirms the above findings. CT ABDOMEN and PELVIS FINDINGS Hepatobiliary: Mild periportal edema. Mild thickening of the gallbladder wall likely reflects a component of periportal edema. The gallbladder is not distended and no pericholecystic inflammatory changes are identified. No enhancing intrahepatic mass. No intra or extrahepatic biliary ductal dilation. Pancreas: Unremarkable Spleen: Unremarkable Adrenals/Urinary Tract: 9 mm enhancing nodule within the right adrenal gland measures 49 Hounsfield units in density and is indeterminate though statistically likely represents a small adrenal adenoma in a patient without a history of malignancy. Nodular thickening of the left adrenal gland noted without a discrete nodule identified. The kidneys are unremarkable. Foley catheter balloon seen within a decompressed bladder lumen. Stomach/Bowel: The stomach is unremarkable. The small bowel is diffusely mildly dilated and fluid-filled and demonstrates diffuse hyperemia, a finding that can be seen in systemic hypotension and developing adynamic ileus. There is  superimposed pneumatosis involving the cecum and ascending colon with mesenteric venous gas identified. There is relative hypoenhancement of this segment of bowel in keeping with regional bowel ischemia. No free intraperitoneal gas or fluid. The rectum is unremarkable. Vascular/Lymphatic: Extensive aortoiliac atherosclerotic calcification. Prominent atherosclerotic calcification involving the superior mesenteric artery origin results in a near occlusion of this vessel. Celiac axis and inferior mesenteric artery appear widely patent. No aortic aneurysm. No pathologic adenopathy within the abdomen and pelvis. Reproductive: Status post hysterectomy. No adnexal masses. Other: No abdominal wall hernia. Musculoskeletal: Osseous structures are age-appropriate. No acute bone abnormality. No lytic or blastic bone lesion. Review of the MIP images confirms the above findings. IMPRESSION: 1. No pulmonary embolism. 2. Multifocal consolidation within the dependent lungs, most severe within the superior segment of the right lower lobe and posterior basal left lower lobe which may relate to acute infection or aspiration. 3. Moderate multi-vessel coronary artery calcification. 4. Acute fractures of the right 4, 6 and left 2-6 ribs anteriorly. No pneumothorax. 5. Regional bowel ischemia involving the cecum and ascending colon with associated pneumatosis and mesenteric venous gas. No free intraperitoneal gas or fluid. 6. Diffuse hyperemia and fluid distension of the small bowel, a finding that can be seen in systemic hypotension and developing adynamic ileus. 7. Peripheral vascular disease with near occlusion of the superior mesenteric artery origin. 8. 9 mm enhancing nodule within the right adrenal gland, indeterminate though statistically likely represents a small adrenal adenoma in a patient without a history of malignancy. If indicated, this could be reassessed in 1 year with adrenal mass protocol CT or MRI examination for  confirmation. Electronically Signed: By: Helyn Numbers M.D. On: 03/11/2023 21:51   CT ABDOMEN PELVIS W CONTRAST  Addendum Date: 03/09/2023   ADDENDUM REPORT: 03/06/2023 22:07 ADDENDUM: These results were called by telephone at the time of interpretation on 03/17/2023 at  10:06 pm to provider Cheryll Cockayne, MD, who verbally acknowledged these results. Electronically Signed   By: Helyn Numbers M.D.   On: 03/24/2023 22:07   Result Date: 03/08/2023 CLINICAL DATA:  Cardiopulmonary arrest, pulmonary embolism, sepsis EXAM: CT ANGIOGRAPHY CHEST CT ABDOMEN AND PELVIS WITH CONTRAST TECHNIQUE: Multidetector CT imaging of the chest was performed using the standard protocol during bolus administration of intravenous contrast. Multiplanar CT image reconstructions and MIPs were obtained to evaluate the vascular anatomy. Multidetector CT imaging of the abdomen and pelvis was performed using the standard protocol during bolus administration of intravenous contrast. RADIATION DOSE REDUCTION: This exam was performed according to the departmental dose-optimization program which includes automated exposure control, adjustment of the mA and/or kV according to patient size and/or use of iterative reconstruction technique. CONTRAST:  OMNIPAQUE IOHEXOL 350 MG/ML SOLN COMPARISON:  CT chest 05/18/2018 FINDINGS: CTA CHEST FINDINGS Cardiovascular: There is adequate opacification of the pulmonary arterial tree. No intraluminal filling defect identified to suggest acute pulmonary embolism. Central pulmonary arteries are of normal caliber. Moderate multi-vessel coronary artery calcification. Global cardiac size within normal limits. No pericardial effusion. Moderate atherosclerotic calcification within the thoracic aorta. No aortic aneurysm. Mediastinum/Nodes: Endotracheal tube seen 2.8 cm above the carina. Nasogastric tube extends into the proximal body of the stomach. There is extensive debris within the esophagus. Visualized thyroid  is unremarkable. No pathologic thoracic adenopathy. Lungs/Pleura: There is multifocal consolidation within the dependent lungs, most severe within the superior segment of the right lower lobe and posterior basal left lower lobe which may relate to acute infection or aspiration. Superimposed volume loss within the left lower lobe. No pneumothorax or pleural effusion. Musculoskeletal: There are acute fractures of the right 4, 6 and left 2-6 ribs anteriorly. Review of the MIP images confirms the above findings. CT ABDOMEN and PELVIS FINDINGS Hepatobiliary: Mild periportal edema. Mild thickening of the gallbladder wall likely reflects a component of periportal edema. The gallbladder is not distended and no pericholecystic inflammatory changes are identified. No enhancing intrahepatic mass. No intra or extrahepatic biliary ductal dilation. Pancreas: Unremarkable Spleen: Unremarkable Adrenals/Urinary Tract: 9 mm enhancing nodule within the right adrenal gland measures 49 Hounsfield units in density and is indeterminate though statistically likely represents a small adrenal adenoma in a patient without a history of malignancy. Nodular thickening of the left adrenal gland noted without a discrete nodule identified. The kidneys are unremarkable. Foley catheter balloon seen within a decompressed bladder lumen. Stomach/Bowel: The stomach is unremarkable. The small bowel is diffusely mildly dilated and fluid-filled and demonstrates diffuse hyperemia, a finding that can be seen in systemic hypotension and developing adynamic ileus. There is superimposed pneumatosis involving the cecum and ascending colon with mesenteric venous gas identified. There is relative hypoenhancement of this segment of bowel in keeping with regional bowel ischemia. No free intraperitoneal gas or fluid. The rectum is unremarkable. Vascular/Lymphatic: Extensive aortoiliac atherosclerotic calcification. Prominent atherosclerotic calcification involving the  superior mesenteric artery origin results in a near occlusion of this vessel. Celiac axis and inferior mesenteric artery appear widely patent. No aortic aneurysm. No pathologic adenopathy within the abdomen and pelvis. Reproductive: Status post hysterectomy. No adnexal masses. Other: No abdominal wall hernia. Musculoskeletal: Osseous structures are age-appropriate. No acute bone abnormality. No lytic or blastic bone lesion. Review of the MIP images confirms the above findings. IMPRESSION: 1. No pulmonary embolism. 2. Multifocal consolidation within the dependent lungs, most severe within the superior segment of the right lower lobe and posterior basal left lower lobe which  may relate to acute infection or aspiration. 3. Moderate multi-vessel coronary artery calcification. 4. Acute fractures of the right 4, 6 and left 2-6 ribs anteriorly. No pneumothorax. 5. Regional bowel ischemia involving the cecum and ascending colon with associated pneumatosis and mesenteric venous gas. No free intraperitoneal gas or fluid. 6. Diffuse hyperemia and fluid distension of the small bowel, a finding that can be seen in systemic hypotension and developing adynamic ileus. 7. Peripheral vascular disease with near occlusion of the superior mesenteric artery origin. 8. 9 mm enhancing nodule within the right adrenal gland, indeterminate though statistically likely represents a small adrenal adenoma in a patient without a history of malignancy. If indicated, this could be reassessed in 1 year with adrenal mass protocol CT or MRI examination for confirmation. Electronically Signed: By: Helyn Numbers M.D. On: 03/18/2023 21:51   CT Head Wo Contrast  Result Date: 03/01/2023 CLINICAL DATA:  Mental status change, unknown cause. Found unresponsive in bed at [facility] and CPR initiated by staff. EXAM: CT HEAD WITHOUT CONTRAST TECHNIQUE: Contiguous axial images were obtained from the base of the skull through the vertex without  intravenous contrast. RADIATION DOSE REDUCTION: This exam was performed according to the departmental dose-optimization program which includes automated exposure control, adjustment of the mA and/or kV according to patient size and/or use of iterative reconstruction technique. 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 Evidence of diffuse cerebrocerebellar edema with less distinct gray-white matter differentiation than before, cerebrocerebellar sulcal effacement, and partially effaced sylvian fissures. Background moderately developed atrophy, moderate to severe cerebral white matter small vessel disease and moderate atrophic ventriculomegaly. The ventricles are patent but are slightly smaller than previously due to the edema. There is no midline shift. No focal cortical based infarct is seen and no evidence of downward mass effect or cerebellar tonsillar herniation. Basal cisterns are still patent.  No focal hemorrhage is seen. There are benign dural calcifications in the frontal falx. Vascular: No hyperdense central vessel is seen. Scattered calcification again noted in both siphons. Skull: No fracture or focal lesion. Sinuses/Orbits: There is moderate fluid in left maxillary sinus. This could be due to recent intubation or sinusitis. Scattered membrane thickening in the ethmoids. Other visualized sinuses and bilateral mastoids are clear. Negative orbits. Other: None. IMPRESSION: 1. Evidence of diffuse cerebrocerebellar edema with sulcal effacement and partially effaced Sylvian fissures. 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 2. No focal cortical based infarct or hemorrhage is seen. 3. Chronic changes. 4. Fluid in the left maxillary sinus which could be due to recent intubation or sinusitis. 5. Critical Value/emergent results were called by telephone at  the time of interpretation on 03/02/2023 at 9:43 pm to provider Dr. Nedra Hai, who verbally acknowledged these results. Electronically Signed   By: Almira Bar M.D.   On: 03/18/2023 21:43   DG Abd Portable 1 View  Result Date: 03/22/2023 CLINICAL DATA:  Enteric tube placement. EXAM: PORTABLE ABDOMEN - 1 VIEW COMPARISON:  None. FINDINGS: Enteric tube tip in the gastric body. Air and stool in the visualized colon. No radio-opaque calculi or other significant radiographic abnormality are seen. IMPRESSION: 1. Enteric tube tip in the gastric body. Electronically Signed   By: Obie Dredge M.D.   On: 03/14/2023 20:23   DG Chest Port 1 View  Result Date: 03/18/2023 CLINICAL DATA:  Intubation.  Status post cardiac arrest. EXAM: PORTABLE  CHEST 1 VIEW COMPARISON:  Chest x-ray dated December 03, 2020. FINDINGS: Endotracheal tube tip in good position proximally 4 cm above the carina. The heart size and mediastinal contours are within normal limits. Central pulmonary vascular congestion. No focal consolidation, pleural effusion, or pneumothorax. No acute osseous abnormality. IMPRESSION: 1. Endotracheal tube in good position. 2. Central pulmonary vascular congestion. Electronically Signed   By: Obie Dredge M.D.   On: 03/16/2023 20:21    Microbiology Recent Results (from the past 240 hour(s))  Resp panel by RT-PCR (RSV, Flu A&B, Covid) Anterior Nasal Swab     Status: None   Collection Time: 02/28/2023  7:28 PM   Specimen: Anterior Nasal Swab  Result Value Ref Range Status   SARS Coronavirus 2 by RT PCR NEGATIVE NEGATIVE Final    Comment: (NOTE) SARS-CoV-2 target nucleic acids are NOT DETECTED.  The SARS-CoV-2 RNA is generally detectable in upper respiratory specimens during the acute phase of infection. The lowest concentration of SARS-CoV-2 viral copies this assay can detect is 138 copies/mL. A negative result does not preclude SARS-Cov-2 infection and should not be used as the sole basis for treatment  or other patient management decisions. A negative result may occur with  improper specimen collection/handling, submission of specimen other than nasopharyngeal swab, presence of viral mutation(s) within the areas targeted by this assay, and inadequate number of viral copies(<138 copies/mL). A negative result must be combined with clinical observations, patient history, and epidemiological information. The expected result is Negative.  Fact Sheet for Patients:  BloggerCourse.com  Fact Sheet for Healthcare Providers:  SeriousBroker.it  This test is no t yet approved or cleared by the [place] FDA and  has been authorized for detection and/or diagnosis of SARS-CoV-2 by FDA under an Emergency Use Authorization (EUA). This EUA will remain  in effect (meaning this test can be used) for the duration of the COVID-19 declaration under Section 564(b)(1) of the Act, 21 U.S.C.section 360bbb-3(b)(1), unless the authorization is terminated  or revoked sooner.       Influenza A by PCR NEGATIVE NEGATIVE Final   Influenza B by PCR NEGATIVE NEGATIVE Final    Comment: (NOTE) The Xpert Xpress SARS-CoV-2/FLU/RSV plus assay is intended as an aid in the diagnosis of influenza from Nasopharyngeal swab specimens and should not be used as a sole basis for treatment. Nasal washings and aspirates are unacceptable for Xpert Xpress SARS-CoV-2/FLU/RSV testing.  Fact Sheet for Patients: BloggerCourse.com  Fact Sheet for Healthcare Providers: SeriousBroker.it  This test is not yet approved or cleared by the [place] FDA and has been authorized for detection and/or diagnosis of SARS-CoV-2 by FDA under an Emergency Use Authorization (EUA). This EUA will remain in effect (meaning this test can be used) for the duration of the COVID-19 declaration under Section 564(b)(1) of the Act, 21 U.S.C. section  360bbb-3(b)(1), unless the authorization is terminated or revoked.     Resp Syncytial Virus by PCR NEGATIVE NEGATIVE Final    Comment: (NOTE) Fact Sheet for Patients: BloggerCourse.com  Fact Sheet for Healthcare Providers: SeriousBroker.it  This test is not yet approved or cleared by the [place] FDA and has been authorized for detection and/or diagnosis of SARS-CoV-2 by FDA under an Emergency Use Authorization (EUA). This EUA will remain in effect (meaning this test can be used) for the duration of the COVID-19 declaration under Section 564(b)(1) of the Act, 21 U.S.C. section 360bbb-3(b)(1), unless the authorization is terminated or revoked.  Performed at [facility], [address], [place], [place] [address]   MRSA Next Gen by PCR, Nasal     Status: None   Collection Time: 03/01/2023  9:45 PM   Specimen: Nasal Mucosa; Nasal Swab  Result Value Ref Range Status   MRSA by PCR Next Gen NOT DETECTED NOT DETECTED Final    Comment: (NOTE) The GeneXpert MRSA Assay (FDA approved for NASAL specimens only), is one component of a comprehensive MRSA colonization surveillance program. It is not intended to diagnose MRSA infection nor to guide or monitor treatment for MRSA infections. Test performance is not FDA approved in patients less than 74 years old. Performed at [facility], [address] [place], [place] [address]     Lab Basic Metabolic Panel: Recent Labs  Lab 03/03/2023 1927 03/08/2023 2216  NA 139  --   K 4.8  --   CL 104  --   CO2 11*  --   GLUCOSE 334*  --   BUN 13  --   CREATININE 0.82  --   CALCIUM 8.7*  --   MG  --  2.3  PHOS  --  9.1*   Liver Function Tests: Recent Labs  Lab 03/15/2023 1927  AST 629*  ALT 473*  ALKPHOS 89  BILITOT 0.6  PROT 6.1*  ALBUMIN 2.7*   No results for input(s): "LIPASE", "AMYLASE" in the last 168 hours. No results for input(s): "AMMONIA" in the last  168 hours. CBC: Recent Labs  Lab 03/01/2023 1927  WBC 7.5  NEUTROABS 1.8  HGB 12.3  HCT 45.6  MCV 86.5  PLT 232   Cardiac Enzymes: No results for input(s): "CKTOTAL", "CKMB", "CKMBINDEX", "TROPONINI" in the last 168 hours. Sepsis Labs: Recent Labs  Lab 03/24/2023 1926 03/12/2023 1927 03/03/2023 2216  PROCALCITON  --   --  0.28  WBC  --  7.5  --   LATICACIDVEN >9.0*  --  >9.0*    Procedures/Operations  03/17/2023 ETT placement   Micheline Rough L Rust-[place] 03/13/2023, 12:35 AM  Cheryll Cockayne [place], AGACNP-BC Acute Care Nurse Practitioner  Pulmonary & Critical Care   [phone] / [phone] Please see Amion for details.

## 2023-03-30 DEATH — deceased

## 2023-03-31 LAB — CULTURE, BLOOD (ROUTINE X 2): Culture: NO GROWTH
